# Patient Record
Sex: Female | Born: 1970 | Race: Black or African American | Hispanic: No | Marital: Single | Smoking: Never smoker
Health system: Southern US, Community
[De-identification: ages and names within clinical notes are randomized; demographics above are authoritative.]

## PROBLEM LIST (undated history)

## (undated) DIAGNOSIS — J302 Other seasonal allergic rhinitis: Secondary | ICD-10-CM

## (undated) DIAGNOSIS — Z87442 Personal history of urinary calculi: Secondary | ICD-10-CM

## (undated) DIAGNOSIS — R519 Headache, unspecified: Secondary | ICD-10-CM

## (undated) DIAGNOSIS — G4730 Sleep apnea, unspecified: Secondary | ICD-10-CM

## (undated) DIAGNOSIS — Z8679 Personal history of other diseases of the circulatory system: Secondary | ICD-10-CM

## (undated) DIAGNOSIS — D649 Anemia, unspecified: Secondary | ICD-10-CM

## (undated) DIAGNOSIS — R51 Headache: Secondary | ICD-10-CM

## (undated) DIAGNOSIS — D219 Benign neoplasm of connective and other soft tissue, unspecified: Secondary | ICD-10-CM

## (undated) DIAGNOSIS — IMO0002 Reserved for concepts with insufficient information to code with codable children: Secondary | ICD-10-CM

## (undated) DIAGNOSIS — J45909 Unspecified asthma, uncomplicated: Secondary | ICD-10-CM

## (undated) DIAGNOSIS — R87610 Atypical squamous cells of undetermined significance on cytologic smear of cervix (ASC-US): Secondary | ICD-10-CM

## (undated) DIAGNOSIS — N803 Endometriosis of pelvic peritoneum: Secondary | ICD-10-CM

## (undated) DIAGNOSIS — N201 Calculus of ureter: Secondary | ICD-10-CM

## (undated) DIAGNOSIS — K219 Gastro-esophageal reflux disease without esophagitis: Secondary | ICD-10-CM

## (undated) DIAGNOSIS — Z8669 Personal history of other diseases of the nervous system and sense organs: Secondary | ICD-10-CM

## (undated) DIAGNOSIS — Z8719 Personal history of other diseases of the digestive system: Secondary | ICD-10-CM

## (undated) DIAGNOSIS — Z8742 Personal history of other diseases of the female genital tract: Secondary | ICD-10-CM

## (undated) DIAGNOSIS — E876 Hypokalemia: Secondary | ICD-10-CM

## (undated) DIAGNOSIS — R3915 Urgency of urination: Secondary | ICD-10-CM

## (undated) DIAGNOSIS — I10 Essential (primary) hypertension: Secondary | ICD-10-CM

## (undated) DIAGNOSIS — M5030 Other cervical disc degeneration, unspecified cervical region: Secondary | ICD-10-CM

## (undated) DIAGNOSIS — T8859XA Other complications of anesthesia, initial encounter: Secondary | ICD-10-CM

## (undated) HISTORY — PX: OTHER SURGICAL HISTORY: SHX169

## (undated) HISTORY — PX: APPENDECTOMY: SHX54

## (undated) HISTORY — PX: ABDOMINAL HYSTERECTOMY: SHX81

## (undated) HISTORY — DX: Personal history of other diseases of the female genital tract: Z87.42

---
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
## 1998-04-22 ENCOUNTER — Emergency Department (HOSPITAL_COMMUNITY): Admission: EM | Admit: 1998-04-22 | Discharge: 1998-04-22 | Payer: Self-pay | Admitting: Emergency Medicine

## 1998-04-23 ENCOUNTER — Emergency Department (HOSPITAL_COMMUNITY): Admission: EM | Admit: 1998-04-23 | Discharge: 1998-04-23 | Payer: Self-pay | Admitting: Emergency Medicine

## 1998-04-28 ENCOUNTER — Encounter: Payer: Self-pay | Admitting: Emergency Medicine

## 1998-04-28 ENCOUNTER — Emergency Department (HOSPITAL_COMMUNITY): Admission: EM | Admit: 1998-04-28 | Discharge: 1998-04-28 | Payer: Self-pay | Admitting: Emergency Medicine

## 1999-02-16 ENCOUNTER — Emergency Department (HOSPITAL_COMMUNITY): Admission: EM | Admit: 1999-02-16 | Discharge: 1999-02-17 | Payer: Self-pay | Admitting: Emergency Medicine

## 1999-02-17 ENCOUNTER — Emergency Department (HOSPITAL_COMMUNITY): Admission: EM | Admit: 1999-02-17 | Discharge: 1999-02-17 | Payer: Self-pay | Admitting: Emergency Medicine

## 1999-02-20 ENCOUNTER — Other Ambulatory Visit: Admission: RE | Admit: 1999-02-20 | Discharge: 1999-02-20 | Payer: Self-pay | Admitting: Family Medicine

## 1999-03-13 ENCOUNTER — Encounter: Admission: RE | Admit: 1999-03-13 | Discharge: 1999-03-13 | Payer: Self-pay | Admitting: Obstetrics & Gynecology

## 1999-03-13 ENCOUNTER — Other Ambulatory Visit: Admission: RE | Admit: 1999-03-13 | Discharge: 1999-03-13 | Payer: Self-pay | Admitting: Obstetrics & Gynecology

## 1999-03-27 ENCOUNTER — Encounter: Admission: RE | Admit: 1999-03-27 | Discharge: 1999-03-27 | Payer: Self-pay | Admitting: Obstetrics & Gynecology

## 1999-06-14 ENCOUNTER — Ambulatory Visit (HOSPITAL_COMMUNITY): Admission: RE | Admit: 1999-06-14 | Discharge: 1999-06-14 | Payer: Self-pay | Admitting: *Deleted

## 1999-06-14 ENCOUNTER — Encounter: Payer: Self-pay | Admitting: *Deleted

## 1999-07-31 ENCOUNTER — Encounter: Admission: RE | Admit: 1999-07-31 | Discharge: 1999-07-31 | Payer: Self-pay | Admitting: Obstetrics & Gynecology

## 2001-05-13 HISTORY — PX: OTHER SURGICAL HISTORY: SHX169

## 2001-08-30 ENCOUNTER — Inpatient Hospital Stay (HOSPITAL_COMMUNITY): Admission: AD | Admit: 2001-08-30 | Discharge: 2001-08-30 | Payer: Self-pay | Admitting: *Deleted

## 2002-04-09 ENCOUNTER — Emergency Department (HOSPITAL_COMMUNITY): Admission: EM | Admit: 2002-04-09 | Discharge: 2002-04-09 | Payer: Self-pay | Admitting: Emergency Medicine

## 2003-01-05 ENCOUNTER — Emergency Department (HOSPITAL_COMMUNITY): Admission: EM | Admit: 2003-01-05 | Discharge: 2003-01-06 | Payer: Self-pay | Admitting: Emergency Medicine

## 2003-01-07 ENCOUNTER — Inpatient Hospital Stay (HOSPITAL_COMMUNITY): Admission: AD | Admit: 2003-01-07 | Discharge: 2003-01-07 | Payer: Self-pay | Admitting: Family Medicine

## 2003-01-19 ENCOUNTER — Ambulatory Visit (HOSPITAL_COMMUNITY): Admission: RE | Admit: 2003-01-19 | Discharge: 2003-01-19 | Payer: Self-pay | Admitting: Obstetrics

## 2003-01-19 ENCOUNTER — Encounter: Payer: Self-pay | Admitting: Obstetrics

## 2003-05-14 HISTORY — PX: OTHER SURGICAL HISTORY: SHX169

## 2003-08-04 ENCOUNTER — Emergency Department (HOSPITAL_COMMUNITY): Admission: EM | Admit: 2003-08-04 | Discharge: 2003-08-05 | Payer: Self-pay | Admitting: Emergency Medicine

## 2003-08-06 ENCOUNTER — Emergency Department (HOSPITAL_COMMUNITY): Admission: EM | Admit: 2003-08-06 | Discharge: 2003-08-06 | Payer: Self-pay | Admitting: Emergency Medicine

## 2003-10-21 ENCOUNTER — Emergency Department (HOSPITAL_COMMUNITY): Admission: EM | Admit: 2003-10-21 | Discharge: 2003-10-21 | Payer: Self-pay | Admitting: Family Medicine

## 2004-02-06 ENCOUNTER — Encounter: Admission: RE | Admit: 2004-02-06 | Discharge: 2004-02-06 | Payer: Self-pay | Admitting: Family Medicine

## 2004-03-28 ENCOUNTER — Emergency Department (HOSPITAL_COMMUNITY): Admission: EM | Admit: 2004-03-28 | Discharge: 2004-03-28 | Payer: Self-pay | Admitting: Emergency Medicine

## 2004-07-15 ENCOUNTER — Inpatient Hospital Stay (HOSPITAL_COMMUNITY): Admission: AD | Admit: 2004-07-15 | Discharge: 2004-07-15 | Payer: Self-pay | Admitting: *Deleted

## 2006-05-28 ENCOUNTER — Emergency Department (HOSPITAL_COMMUNITY): Admission: EM | Admit: 2006-05-28 | Discharge: 2006-05-28 | Payer: Self-pay | Admitting: Emergency Medicine

## 2006-10-08 ENCOUNTER — Emergency Department (HOSPITAL_COMMUNITY): Admission: EM | Admit: 2006-10-08 | Discharge: 2006-10-08 | Payer: Self-pay | Admitting: Emergency Medicine

## 2006-12-10 ENCOUNTER — Other Ambulatory Visit: Admission: RE | Admit: 2006-12-10 | Discharge: 2006-12-10 | Payer: Self-pay | Admitting: Family Medicine

## 2006-12-23 ENCOUNTER — Ambulatory Visit (HOSPITAL_COMMUNITY): Admission: RE | Admit: 2006-12-23 | Discharge: 2006-12-23 | Payer: Self-pay | Admitting: General Surgery

## 2006-12-29 ENCOUNTER — Encounter: Admission: RE | Admit: 2006-12-29 | Discharge: 2007-01-27 | Payer: Self-pay | Admitting: General Surgery

## 2007-07-15 ENCOUNTER — Emergency Department (HOSPITAL_COMMUNITY): Admission: EM | Admit: 2007-07-15 | Discharge: 2007-07-15 | Payer: Self-pay | Admitting: Emergency Medicine

## 2007-07-31 ENCOUNTER — Ambulatory Visit: Payer: Self-pay | Admitting: *Deleted

## 2007-07-31 ENCOUNTER — Ambulatory Visit (HOSPITAL_COMMUNITY): Admission: RE | Admit: 2007-07-31 | Discharge: 2007-07-31 | Payer: Self-pay | Admitting: Emergency Medicine

## 2007-07-31 ENCOUNTER — Encounter: Payer: Self-pay | Admitting: Emergency Medicine

## 2008-05-17 ENCOUNTER — Emergency Department (HOSPITAL_COMMUNITY): Admission: EM | Admit: 2008-05-17 | Discharge: 2008-05-18 | Payer: Self-pay | Admitting: Emergency Medicine

## 2008-05-20 ENCOUNTER — Encounter: Admission: RE | Admit: 2008-05-20 | Discharge: 2008-08-18 | Payer: Self-pay | Admitting: General Surgery

## 2008-06-06 ENCOUNTER — Ambulatory Visit (HOSPITAL_COMMUNITY): Admission: RE | Admit: 2008-06-06 | Discharge: 2008-06-07 | Payer: Self-pay | Admitting: General Surgery

## 2008-06-06 HISTORY — PX: LAPAROSCOPIC GASTRIC BANDING: SHX1100

## 2008-09-08 ENCOUNTER — Encounter: Admission: RE | Admit: 2008-09-08 | Discharge: 2008-09-08 | Payer: Self-pay | Admitting: General Surgery

## 2008-12-08 ENCOUNTER — Encounter: Admission: RE | Admit: 2008-12-08 | Discharge: 2009-03-08 | Payer: Self-pay | Admitting: General Surgery

## 2010-01-31 ENCOUNTER — Emergency Department (HOSPITAL_COMMUNITY): Admission: EM | Admit: 2010-01-31 | Discharge: 2010-01-31 | Payer: Self-pay | Admitting: Family Medicine

## 2010-08-27 LAB — DIFFERENTIAL
Basophils Absolute: 0 10*3/uL (ref 0.0–0.1)
Basophils Absolute: 0 10*3/uL (ref 0.0–0.1)
Basophils Relative: 0 % (ref 0–1)
Eosinophils Absolute: 0.1 10*3/uL (ref 0.0–0.7)
Eosinophils Absolute: 0.1 10*3/uL (ref 0.0–0.7)
Eosinophils Relative: 0 % (ref 0–5)
Eosinophils Relative: 1 % (ref 0–5)
Lymphocytes Relative: 26 % (ref 12–46)
Monocytes Absolute: 0.4 10*3/uL (ref 0.1–1.0)
Monocytes Absolute: 0.4 10*3/uL (ref 0.1–1.0)
Monocytes Relative: 3 % (ref 3–12)
Neutro Abs: 5.7 10*3/uL (ref 1.7–7.7)
Neutro Abs: 7.9 10*3/uL — ABNORMAL HIGH (ref 1.7–7.7)
Neutro Abs: 9 10*3/uL — ABNORMAL HIGH (ref 1.7–7.7)
Neutrophils Relative %: 67 % (ref 43–77)
Neutrophils Relative %: 70 % (ref 43–77)

## 2010-08-27 LAB — BASIC METABOLIC PANEL
BUN: 15 mg/dL (ref 6–23)
CO2: 27 mEq/L (ref 19–32)
Chloride: 104 mEq/L (ref 96–112)
GFR calc Af Amer: >60 mL/min (ref >60)
GFR calc non Af Amer: >60 mL/min (ref >60)
Potassium: 3.6 mEq/L (ref 3.5–5.1)
Sodium: 138 mEq/L (ref 135–145)

## 2010-08-27 LAB — CBC
HCT: 33.5 % — ABNORMAL LOW (ref 36.0–46.0)
Hemoglobin: 11.2 g/dL — ABNORMAL LOW (ref 12.0–15.0)
Hemoglobin: 11.7 g/dL — ABNORMAL LOW (ref 12.0–15.0)
MCHC: 33.3 g/dL (ref 30.0–36.0)
MCV: 79.3 fL (ref 78.0–100.0)
Platelets: 368 10*3/uL (ref 150–400)
Platelets: 425 10*3/uL — ABNORMAL HIGH (ref 150–400)
RBC: 4.23 MIL/uL (ref 3.87–5.11)
RBC: 4.51 MIL/uL (ref 3.87–5.11)
RDW: 16.5 % — ABNORMAL HIGH (ref 11.5–15.5)
WBC: 11.3 10*3/uL — ABNORMAL HIGH (ref 4.0–10.5)
WBC: 11.5 10*3/uL — ABNORMAL HIGH (ref 4.0–10.5)

## 2010-08-27 LAB — COMPREHENSIVE METABOLIC PANEL
Albumin: 3.8 g/dL (ref 3.5–5.2)
Calcium: 9.3 mg/dL (ref 8.4–10.5)
Chloride: 103 mEq/L (ref 96–112)
GFR calc non Af Amer: >60 mL/min (ref >60)
Glucose, Bld: 85 mg/dL (ref 70–99)
Sodium: 138 mEq/L (ref 135–145)
Total Protein: 6.8 g/dL (ref 6.0–8.3)

## 2010-08-27 LAB — HEMOGLOBIN AND HEMATOCRIT, BLOOD: Hemoglobin: 11.8 g/dL — ABNORMAL LOW (ref 12.0–15.0)

## 2010-08-27 LAB — PREGNANCY, URINE: Preg Test, Ur: NEGATIVE

## 2010-09-25 NOTE — Op Note (Signed)
NAMELESLYE, PUCCINI                 ACCOUNT NO.:  [id number]   MEDICAL RECORD NO.:  [id number]          PATIENT TYPE:  OIB   LOCATION:  1538                         FACILITY:  [facility]   PHYSICIAN:  Sharlet Salina T. Hoxworth, M.D.DATE OF BIRTH:  1971-04-27   DATE OF PROCEDURE:  06/06/2008  DATE OF DISCHARGE:                               OPERATIVE REPORT   PREOPERATIVE DIAGNOSIS:  Morbid obesity.   POSTOPERATIVE DIAGNOSIS:  Morbid obesity.   SURGICAL PROCEDURES:  Placement of laparoscopic adjustable gastric band.   SURGEON:  Dr. Johna Sheriff   ASSISTANT:  Sandria Bales. Ezzard Standing, M.D.   ANESTHESIA:  General.   BRIEF HISTORY:  Ms. Potenza is a 40 year old female with progressive  morbid obesity unresponsive to medical management who presents with a  BMI of 44.1 at 252 pounds with comorbidities of hypertension and  obstructive sleep apnea.  After extensive discussion detailed elsewhere,  we elected to proceed with placement of laparoscopic adjustable gastric  band for treatment of her morbid obesity.  She is now brought to the  operating room for this procedure.   DESCRIPTION OF OPERATION:  The patient was brought to the operating room  and placed in the supine position on the operating table and general  endotracheal anesthesia was induced.  She was a moderately difficult  intubation.  The abdomen was widely sterilely prepped and draped.  She  had received preoperative IV antibiotics and subcutaneous heparin.  PAS  were in place.  Correct patient and procedure were verified.  Access was  obtained with an 11-mm Optiview trocar in the left subcostal space and  pneumoperitoneum established without difficulty.  There was no evidence  of trocar injury.  Under direct vision a 15-mm trocar was placed  laterally in the right upper quadrant, another 11-mm trocar in the right  subcostal space midclavicular line, and another 11-mm trocar for the  camera just above and to the left of the umbilicus.  Under  direct vision  through a 5-mm subxiphoid site the [facility] retractor was placed and  the left lobe of the liver elevated with excellent exposure of the  stomach and the hiatus.  The patient did not have any evidence of hiatal  hernia on preoperative upper GI series or direct inspection of the  hiatus.  She did have reflux.  A sizing tube was passed orally down to  the stomach and the balloon inflated to 15 mL and pulled back snugly  against the hiatus and there was no evidence of herniation.  The balloon  was deflated and the tube pulled back into the upper esophagus.  The  peritoneum at the angle of His was incised and careful blunt dissection  carried down onto the left crus.  Following this the pars flaccida was  divided in an avascular area and the right crus identified and crossing  fat at the base of the crus identified.  The peritoneum was incised here  and careful blunt dissection was carried back in the retrogastric space  with the finger dissector which was then deployed up to the previously  dissected area of the  angle of His without difficulty.  A flushed AP  standard band system was introduced and the tubing placed into the  passer and brought back behind the stomach without difficulty, and the  band brought back behind the stomach as well.  With the sizing tube back  in place in the stomach, the band was snapped, and there was no undue  tension.  The sizing tube was removed.  Holding the tubing toward the  patient's feet to expose the small gastric pouch, the fundus was  imbricated up over the band to the pouch with several interrupted 2-0  Ethibond sutures.  The band appeared to be in excellent position.  There  was no bleeding or evidence of trocar injury.  The tubing was brought  out through the right mid abdominal trocar site and under direct vision  the [facility] retractor was removed and all CO2 evacuated and trocars  removed.  This right mid abdominal incision was  extended slightly  laterally and a subcutaneous pocket created to the port.  A 2-cm square  of polypropylene mesh was sutured to the underside of the port and then  the tubing was cut and the port detached and the port placed in a  subcutaneous position nicely oriented.  The tubing was introduced  smoothly into the abdominal cavity.  Subcu was closed with running 2-0  Vicryl.  Skin was closed with subcutaneous Monocryl and  Dermabond.  Sponge, needle, and instrument counts were correct.  She was taken to  Recovery.      Lorne Skeens. Hoxworth, M.D.  Electronically Signed     BTH/MEDQ  D:  06/06/2008  T:  06/06/2008  Job:  [id number]

## 2010-11-23 ENCOUNTER — Encounter (INDEPENDENT_AMBULATORY_CARE_PROVIDER_SITE_OTHER): Payer: Self-pay | Admitting: General Surgery

## 2010-12-07 ENCOUNTER — Encounter (INDEPENDENT_AMBULATORY_CARE_PROVIDER_SITE_OTHER): Payer: Self-pay

## 2010-12-26 ENCOUNTER — Encounter (INDEPENDENT_AMBULATORY_CARE_PROVIDER_SITE_OTHER): Payer: Self-pay | Admitting: General Surgery

## 2011-06-06 ENCOUNTER — Emergency Department (INDEPENDENT_AMBULATORY_CARE_PROVIDER_SITE_OTHER)
Admission: EM | Admit: 2011-06-06 | Discharge: 2011-06-06 | Disposition: A | Discharge status: Home / Self Care | Payer: Self-pay | Source: Home / Self Care | Attending: Family Medicine | Admitting: Family Medicine

## 2011-06-06 DIAGNOSIS — J45901 Unspecified asthma with (acute) exacerbation: Secondary | ICD-10-CM

## 2011-06-06 DIAGNOSIS — J069 Acute upper respiratory infection, unspecified: Secondary | ICD-10-CM

## 2011-06-06 MED ORDER — CETIRIZINE HCL 10 MG PO CAPS: 1.0000 | ORAL_CAPSULE | Freq: Every evening | ORAL | Status: DC | PRN

## 2011-06-06 MED ORDER — ALBUTEROL SULFATE HFA 108 (90 BASE) MCG/ACT IN AERS: 1.0000 | INHALATION_SPRAY | Freq: Four times a day (QID) | RESPIRATORY_TRACT | Status: DC | PRN

## 2011-06-06 MED ORDER — ACETAMINOPHEN-CODEINE 120-12 MG/5ML PO SUSP: 5.0000 mL | Freq: Four times a day (QID) | ORAL | Status: AC | PRN

## 2011-06-06 MED ORDER — PREDNISONE 20 MG PO TABS: ORAL_TABLET | ORAL | Status: AC

## 2011-06-06 NOTE — ED Provider Notes (Signed)
History     CSN: [id number]  Arrival date & time 06/06/11  1553   First MD Initiated Contact with Patient 06/06/11 1601      Chief Complaint  Patient presents with  . URI    (Consider location/radiation/quality/duration/timing/severity/associated sxs/prior treatment) HPI Comments: 41 y/o female h/o asthma here c/o sore throat, hoarseness, chest congestion and wheezing episodes at night time for 3 days. Ran out albuterol. No fever, no chest pain or shortness or breath. No rash, no nausea vomiting or diarrhea.   No past medical history on file.  No past surgical history on file.  No family history on file.  History  Substance Use Topics  . Smoking status: Not on file  . Smokeless tobacco: Not on file  . Alcohol Use: Not on file    OB History    No data available      Review of Systems  Constitutional: Negative for fever, chills, diaphoresis and appetite change.  HENT: Positive for congestion, sore throat and voice change. Negative for rhinorrhea, trouble swallowing, neck pain and sinus pressure.   Respiratory: Positive for cough, chest tightness and wheezing. Negative for shortness of breath.   Cardiovascular: Negative for chest pain, palpitations and leg swelling.  Gastrointestinal: Negative for nausea, vomiting, abdominal pain and diarrhea.  Musculoskeletal: Negative for myalgias and arthralgias.  Skin: Negative for rash.  Neurological: Negative for dizziness and headaches.    Allergies  Vicodin  Home Medications   Current Outpatient Rx  Name Route Sig Dispense Refill  . ACETAMINOPHEN-CODEINE 120-12 MG/5ML PO SUSP Oral Take 5 mLs by mouth every 6 (six) hours as needed for pain. 60 mL 0  . ALBUTEROL SULFATE HFA 108 (90 BASE) MCG/ACT IN AERS Inhalation Inhale 1-2 puffs into the lungs every 6 (six) hours as needed for wheezing. 1 Inhaler 0  . CETIRIZINE HCL 10 MG PO CAPS Oral Take 1 capsule (10 mg total) by mouth at bedtime as needed. 30 capsule 0  . PREDNISONE  20 MG PO TABS  2 tabs po daily for 5 days 10 tablet no    BP 102/58  Pulse 76  Temp(Src) 98.3 F (36.8 C) (Oral)  Resp 18  SpO2 100%  Physical Exam  Nursing note and vitals reviewed. Constitutional: She is oriented to person, place, and time. She appears well-developed and well-nourished. No distress.  HENT:  Head: Normocephalic and atraumatic.  Right Ear: External ear normal.  Left Ear: External ear normal.  Mouth/Throat: No oropharyngeal exudate.       Nasal Congestion with erythema and swelling of nasal turbinates. No rhinorrhea. Mild pharyngeal erythema no exudates. No uvula deviation. No trismus. TM's normal.  Eyes: Conjunctivae and EOM are normal. Pupils are equal, round, and reactive to light.  Neck: Neck supple.  Cardiovascular: Normal rate, regular rhythm and normal heart sounds.   Pulmonary/Chest: Effort normal and breath sounds normal. No respiratory distress. She has no wheezes. She has no rales. She exhibits no tenderness.  Lymphadenopathy:    She has no cervical adenopathy.  Neurological: She is alert and oriented to person, place, and time.  Skin: No rash noted.    ED Course  Procedures (including critical care time)  Labs Reviewed - No data to display No results found.   1. URI (upper respiratory infection)   2. Asthma exacerbation       MDM  Refilled albuterol, prescribed prednisone, codeine and cetirizine.         Sharin Grave, MD 06/06/11 1718

## 2011-06-06 NOTE — ED Notes (Signed)
Pt here with cold sx and chest congestion that started x3 dys.pt has hx asthma but ran out of inhaler.tried otc cough meds but no relief

## 2011-06-07 ENCOUNTER — Telehealth (HOSPITAL_COMMUNITY): Payer: Self-pay | Admitting: *Deleted

## 2012-01-31 ENCOUNTER — Telehealth (INDEPENDENT_AMBULATORY_CARE_PROVIDER_SITE_OTHER): Payer: Self-pay | Admitting: General Surgery

## 2012-01-31 NOTE — Telephone Encounter (Signed)
I left a voicemail stating the patient should contact [facility] @ [phone] to schedule a follow-up appt...cef °

## 2013-03-30 ENCOUNTER — Encounter (HOSPITAL_COMMUNITY): Payer: Self-pay | Admitting: Emergency Medicine

## 2013-03-30 ENCOUNTER — Other Ambulatory Visit (HOSPITAL_COMMUNITY)
Admission: RE | Admit: 2013-03-30 | Discharge: 2013-03-30 | Disposition: A | Discharge status: Home / Self Care | Payer: Self-pay | Source: Ambulatory Visit | Attending: Emergency Medicine | Admitting: Emergency Medicine

## 2013-03-30 ENCOUNTER — Emergency Department (INDEPENDENT_AMBULATORY_CARE_PROVIDER_SITE_OTHER)
Admission: EM | Admit: 2013-03-30 | Discharge: 2013-03-30 | Disposition: A | Discharge status: Home / Self Care | Payer: Self-pay | Source: Home / Self Care | Attending: Emergency Medicine | Admitting: Emergency Medicine

## 2013-03-30 DIAGNOSIS — N760 Acute vaginitis: Secondary | ICD-10-CM | POA: Insufficient documentation

## 2013-03-30 DIAGNOSIS — Z113 Encounter for screening for infections with a predominantly sexual mode of transmission: Secondary | ICD-10-CM | POA: Insufficient documentation

## 2013-03-30 HISTORY — DX: Essential (primary) hypertension: I10

## 2013-03-30 LAB — HIV ANTIBODY (ROUTINE TESTING W REFLEX): HIV: NONREACTIVE

## 2013-03-30 LAB — RPR: RPR Ser Ql: NONREACTIVE

## 2013-03-30 LAB — POCT URINALYSIS DIP (DEVICE)

## 2013-03-30 MED ORDER — FLUCONAZOLE 150 MG PO TABS: 150.0000 mg | ORAL_TABLET | Freq: Once | ORAL | Status: DC

## 2013-03-30 MED ORDER — METRONIDAZOLE 0.75 % VA GEL: 1.0000 | Freq: Two times a day (BID) | VAGINAL | Status: DC

## 2013-03-30 NOTE — ED Provider Notes (Signed)
Chief Complaint:   Chief Complaint  Patient presents with  . Vaginal Discharge    History of Present Illness:   Meagan Dean is a 42 year old female who has a history of frequent urinary tract infections, until vaginosis, and candidiasis. This has occurred after a lap band surgery in which she lost about 140 pounds. Over the past week she's had a white, clumpy vaginal discharge, odor, and slight itching. Her menses have been irregular over the past several years. Last menstrual period began last week. She is sexually active without any protection. She is concerned about the possibility of STDs. She denies fever, chills, nausea, vomiting, or urinary symptoms.  Review of Systems:  Other than noted above, the patient denies any of the following symptoms: Systemic:  No fever, chills, sweats, or weight loss. GI:  No abdominal pain, nausea, anorexia, vomiting, diarrhea, constipation, melena or hematochezia. GU:  No dysuria, frequency, urgency, hematuria, vaginal discharge, itching, or abnormal vaginal bleeding. Skin:  No rash or itching.  PMFSH:  Past medical history, family history, social history, meds, and allergies were reviewed. She is allergic to Vicodin. She has a history of fibroids, migraine headaches, hypertension, and has had lap band surgery.  Physical Exam:   Vital signs:  BP 111/90  Pulse 67  Temp(Src) 98.8 F (37.1 C) (Oral)  Resp 18  SpO2 100%  LMP 03/22/2013 General:  Alert, oriented and in no distress. Lungs:  Breath sounds clear and equal bilaterally.  No wheezes, rales or rhonchi. Heart:  Regular rhythm.  No gallops or murmers. Abdomen:  Soft, flat and non-distended.  No organomegaly or mass.  No tenderness, guarding or rebound.  Bowel sounds normally active. Pelvic exam:  Normal external genitalia. There was a scant, homogeneous, white, malodorous discharge. Cervix and vaginal mucosa were normal. Uterus was midposition, normal in size and shape, and nontender. No adnexal  masses or tenderness. DNA probes for gonorrhea, Chlamydia, Trichomonas, Candida, Gardnerella were obtained. Skin:  Clear, warm and dry.  Labs:   Results for orders placed during the hospital encounter of 03/30/13  POCT URINALYSIS DIP (DEVICE)      Result Value Range   Glucose, UA NEGATIVE  NEGATIVE mg/dL   Bilirubin Urine SMALL (*) NEGATIVE   Ketones, ur TRACE (*) NEGATIVE mg/dL   Specific Gravity, Urine >=1.030  1.005 - 1.030   Hgb urine dipstick LARGE (*) NEGATIVE   pH 6.5  5.0 - 8.0   Protein, ur 100 (*) NEGATIVE mg/dL   Urobilinogen, UA 2.0 (*) 0.0 - 1.0 mg/dL   Nitrite NEGATIVE  NEGATIVE   Leukocytes, UA TRACE (*) NEGATIVE  POCT PREGNANCY, URINE      Result Value Range   Preg Test, Ur NEGATIVE  NEGATIVE    Assessment:  The encounter diagnosis was Vaginitis.  Differential diagnosis is bacterial vaginosis versus yeast, will treat for both.  Plan:   1.  Meds:  The following meds were prescribed:   New Prescriptions   FLUCONAZOLE (DIFLUCAN) 150 MG TABLET    Take 1 tablet (150 mg total) by mouth once.   METRONIDAZOLE (METROGEL) 0.75 % VAGINAL GEL    Place 1 Applicatorful vaginally 2 (two) times daily.    2.  Patient Education/Counseling:  The patient was given appropriate handouts, self care instructions, and instructed in symptomatic relief.   3.  Follow up:  The patient was told to follow up if no better in 3 to 4 days, if becoming worse in any way, and given some red flag  symptoms such as fever or pelvic pain which would prompt immediate return.  Follow up here as necessary.     Reuben Likes, MD 03/30/13 [phone]

## 2013-03-30 NOTE — ED Notes (Signed)
Pt reports a whitish vaginal discharge the past week with some urinary burning

## 2013-04-01 NOTE — ED Notes (Addendum)
GC/Chlamydia neg., Affirm: Candida and Trich neg., Gardnerella pos., HIV/RPR non-reactive. Pt. adequately treated with Metrogel. Vassie Moselle 04/01/2013

## 2013-06-16 ENCOUNTER — Emergency Department (HOSPITAL_COMMUNITY): Payer: Self-pay

## 2013-06-16 ENCOUNTER — Encounter (HOSPITAL_COMMUNITY): Payer: Self-pay | Admitting: Emergency Medicine

## 2013-06-16 ENCOUNTER — Observation Stay (HOSPITAL_COMMUNITY)
Admission: EM | Admit: 2013-06-16 | Discharge: 2013-06-19 | Disposition: A | Discharge status: Home / Self Care | Payer: Self-pay | Attending: Urology | Admitting: Urology

## 2013-06-16 DIAGNOSIS — N390 Urinary tract infection, site not specified: Secondary | ICD-10-CM | POA: Insufficient documentation

## 2013-06-16 DIAGNOSIS — N200 Calculus of kidney: Secondary | ICD-10-CM | POA: Insufficient documentation

## 2013-06-16 DIAGNOSIS — E876 Hypokalemia: Secondary | ICD-10-CM | POA: Diagnosis present

## 2013-06-16 DIAGNOSIS — N23 Unspecified renal colic: Secondary | ICD-10-CM

## 2013-06-16 DIAGNOSIS — Z87891 Personal history of nicotine dependence: Secondary | ICD-10-CM | POA: Insufficient documentation

## 2013-06-16 DIAGNOSIS — Z9884 Bariatric surgery status: Secondary | ICD-10-CM | POA: Insufficient documentation

## 2013-06-16 DIAGNOSIS — R1110 Vomiting, unspecified: Secondary | ICD-10-CM

## 2013-06-16 DIAGNOSIS — A498 Other bacterial infections of unspecified site: Secondary | ICD-10-CM | POA: Insufficient documentation

## 2013-06-16 DIAGNOSIS — R112 Nausea with vomiting, unspecified: Secondary | ICD-10-CM | POA: Insufficient documentation

## 2013-06-16 DIAGNOSIS — N201 Calculus of ureter: Principal | ICD-10-CM | POA: Diagnosis present

## 2013-06-16 DIAGNOSIS — I10 Essential (primary) hypertension: Secondary | ICD-10-CM | POA: Insufficient documentation

## 2013-06-16 HISTORY — DX: Calculus of ureter: N20.1

## 2013-06-16 LAB — POCT I-STAT, CHEM 8
BUN: 7 mg/dL (ref 6–23)
CHLORIDE: 95 meq/L — AB (ref 96–112)
CREATININE: 1.3 mg/dL — AB (ref 0.50–1.10)
Calcium, Ion: 1.08 mmol/L — ABNORMAL LOW (ref 1.12–1.23)
GLUCOSE: 79 mg/dL (ref 70–99)
HCT: 34 % — ABNORMAL LOW (ref 36.0–46.0)
Hemoglobin: 11.6 g/dL — ABNORMAL LOW (ref 12.0–15.0)
POTASSIUM: 2.2 meq/L — AB (ref 3.7–5.3)
Sodium: 140 mEq/L (ref 137–147)
TCO2: 30 mmol/L (ref 0–100)

## 2013-06-16 LAB — URINALYSIS, ROUTINE W REFLEX MICROSCOPIC
BILIRUBIN URINE: NEGATIVE
Glucose, UA: NEGATIVE mg/dL
Ketones, ur: NEGATIVE mg/dL
Nitrite: NEGATIVE
Protein, ur: 30 mg/dL — AB
Specific Gravity, Urine: 1.017 (ref 1.005–1.030)
UROBILINOGEN UA: 1 mg/dL (ref 0.0–1.0)
pH: 7 (ref 5.0–8.0)

## 2013-06-16 LAB — COMPREHENSIVE METABOLIC PANEL
ALT: 7 U/L (ref 0–35)
AST: 12 U/L (ref 0–37)
Albumin: 3.7 g/dL (ref 3.5–5.2)
Alkaline Phosphatase: 67 U/L (ref 39–117)
BILIRUBIN TOTAL: 0.7 mg/dL (ref 0.3–1.2)
BUN: 8 mg/dL (ref 6–23)
CHLORIDE: 96 meq/L (ref 96–112)
CO2: 32 meq/L (ref 19–32)
CREATININE: 1.35 mg/dL — AB (ref 0.50–1.10)
Calcium: 9 mg/dL (ref 8.4–10.5)
GFR calc non Af Amer: 48 mL/min — ABNORMAL LOW (ref >90)
GFR, EST AFRICAN AMERICAN: 55 mL/min — AB (ref >90)
GLUCOSE: 95 mg/dL (ref 70–99)
Potassium: 2.3 mEq/L — CL (ref 3.7–5.3)
Sodium: 138 mEq/L (ref 137–147)
Total Protein: 7.9 g/dL (ref 6.0–8.3)

## 2013-06-16 LAB — CBC WITH DIFFERENTIAL/PLATELET
BASOS ABS: 0 10*3/uL (ref 0.0–0.1)
Basophils Relative: 0 % (ref 0–1)
Eosinophils Absolute: 0 10*3/uL (ref 0.0–0.7)
Eosinophils Relative: 0 % (ref 0–5)
HEMATOCRIT: 34.6 % — AB (ref 36.0–46.0)
HEMOGLOBIN: 11.4 g/dL — AB (ref 12.0–15.0)
LYMPHS PCT: 22 % (ref 12–46)
Lymphs Abs: 2.1 10*3/uL (ref 0.7–4.0)
MCH: 27 pg (ref 26.0–34.0)
MCHC: 32.9 g/dL (ref 30.0–36.0)
MCV: 82 fL (ref 78.0–100.0)
MONO ABS: 0.4 10*3/uL (ref 0.1–1.0)
MONOS PCT: 5 % (ref 3–12)
Neutro Abs: 6.9 10*3/uL (ref 1.7–7.7)
Neutrophils Relative %: 73 % (ref 43–77)
Platelets: 392 10*3/uL (ref 150–400)
RBC: 4.22 MIL/uL (ref 3.87–5.11)
RDW: 13.6 % (ref 11.5–15.5)
WBC: 9.5 10*3/uL (ref 4.0–10.5)

## 2013-06-16 LAB — POCT I-STAT TROPONIN I: Troponin i, poc: 0 ng/mL (ref 0.00–0.08)

## 2013-06-16 LAB — URINE MICROSCOPIC-ADD ON

## 2013-06-16 LAB — POCT PREGNANCY, URINE: Preg Test, Ur: NEGATIVE

## 2013-06-16 MED ORDER — POTASSIUM CHLORIDE CRYS ER 20 MEQ PO TBCR
40.0000 meq | EXTENDED_RELEASE_TABLET | Freq: Once | ORAL | Status: AC
Start: 2013-06-16 — End: 2013-06-16
  Administered 2013-06-16: 40 meq via ORAL
  Filled 2013-06-16: qty 2

## 2013-06-16 MED ORDER — POTASSIUM CHLORIDE IN NACL 20-0.45 MEQ/L-% IV SOLN
INTRAVENOUS | Status: DC
  Administered 2013-06-16 – 2013-06-18 (×6): via INTRAVENOUS
  Filled 2013-06-16 (×9): qty 1000

## 2013-06-16 MED ORDER — PROMETHAZINE HCL 25 MG/ML IJ SOLN
12.5000 mg | Freq: Four times a day (QID) | INTRAMUSCULAR | Status: DC | PRN
  Administered 2013-06-19: 12.5 mg via INTRAVENOUS
  Filled 2013-06-16: qty 1

## 2013-06-16 MED ORDER — ONDANSETRON HCL 4 MG/2ML IJ SOLN
4.0000 mg | Freq: Once | INTRAMUSCULAR | Status: AC
  Administered 2013-06-16: 4 mg via INTRAVENOUS
  Filled 2013-06-16: qty 2

## 2013-06-16 MED ORDER — HEPARIN SODIUM (PORCINE) 5000 UNIT/ML IJ SOLN
5000.0000 [IU] | Freq: Three times a day (TID) | INTRAMUSCULAR | Status: DC
  Administered 2013-06-16 – 2013-06-19 (×8): 5000 [IU] via SUBCUTANEOUS
  Filled 2013-06-16 (×11): qty 1

## 2013-06-16 MED ORDER — DOCUSATE SODIUM 100 MG PO CAPS
100.0000 mg | ORAL_CAPSULE | Freq: Two times a day (BID) | ORAL | Status: DC
  Administered 2013-06-16 – 2013-06-19 (×6): 100 mg via ORAL
  Filled 2013-06-16 (×8): qty 1

## 2013-06-16 MED ORDER — SODIUM CHLORIDE 0.9 % IV BOLUS (SEPSIS)
1000.0000 mL | Freq: Once | INTRAVENOUS | Status: AC
  Administered 2013-06-16: 1000 mL via INTRAVENOUS

## 2013-06-16 MED ORDER — ONDANSETRON HCL 4 MG/2ML IJ SOLN
4.0000 mg | INTRAMUSCULAR | Status: DC | PRN
  Administered 2013-06-16 – 2013-06-18 (×5): 4 mg via INTRAVENOUS
  Filled 2013-06-16 (×5): qty 2

## 2013-06-16 MED ORDER — KETOROLAC TROMETHAMINE 30 MG/ML IJ SOLN
30.0000 mg | Freq: Once | INTRAMUSCULAR | Status: AC
  Administered 2013-06-16: 30 mg via INTRAVENOUS
  Filled 2013-06-16: qty 1

## 2013-06-16 MED ORDER — HYDROMORPHONE HCL PF 1 MG/ML IJ SOLN
1.0000 mg | INTRAMUSCULAR | Status: DC | PRN
  Administered 2013-06-16 – 2013-06-19 (×8): 1 mg via INTRAVENOUS
  Filled 2013-06-16 (×10): qty 1

## 2013-06-16 MED ORDER — ZOLPIDEM TARTRATE 5 MG PO TABS: 5.0000 mg | ORAL_TABLET | Freq: Every evening | ORAL | Status: DC | PRN

## 2013-06-16 MED ORDER — TAMSULOSIN HCL 0.4 MG PO CAPS
0.4000 mg | ORAL_CAPSULE | Freq: Every day | ORAL | Status: DC
  Administered 2013-06-16 – 2013-06-17 (×2): 0.4 mg via ORAL
  Filled 2013-06-16 (×3): qty 1

## 2013-06-16 MED ORDER — POTASSIUM CHLORIDE 10 MEQ/100ML IV SOLN
10.0000 meq | INTRAVENOUS | Status: AC
  Administered 2013-06-16 (×3): 10 meq via INTRAVENOUS
  Filled 2013-06-16 (×3): qty 100

## 2013-06-16 MED ORDER — PNEUMOCOCCAL VAC POLYVALENT 25 MCG/0.5ML IJ INJ
0.5000 mL | INJECTION | INTRAMUSCULAR | Status: AC
  Administered 2013-06-17: 0.5 mL via INTRAMUSCULAR
  Filled 2013-06-16 (×2): qty 0.5

## 2013-06-16 NOTE — H&P (Signed)
Urology Admission H&P  Chief Complaint: 3 days right flank pain radiating into RLQ with gross hematuria. Nausea/vomiting X 2 days.   History of Present Illness: Healthy 43 YO AA female seen in the ER today for right flank pain radiating into RLQ. Has had (+) gross hematuria but denies previous history of nephrolithiasis. No FM of stone disease. Has taken OTC sinus medication. Denies fever or chills.   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   Past Medical History  Diagnosis Date  . Asthma   . Hypertension    Past Surgical History  Procedure Laterality Date  . Laparoscopic gastric banding      Home Medications:   (Not in a hospital admission) Allergies:  Allergies  Allergen Reactions  . Vicodin [Hydrocodone-Acetaminophen] Other (See Comments)    "very bad reaction"    No family history on file. Social History:  reports that she quit smoking about 4 years ago. She does not have any smokeless tobacco history on file. She reports that she drinks alcohol. She reports that she uses illicit drugs.  Review of Systems  Gastrointestinal: Positive for nausea, vomiting and abdominal pain.  Genitourinary: Positive for hematuria.  Musculoskeletal: Positive for back pain.    Physical Exam:  Vital signs in last 24 hours: Temp:  [97.6 F (36.4 C)-99.3 F (37.4 C)] 97.6 F (36.4 C) (02/04 1353) Pulse Rate:  [75-100] 100 (02/04 1353) Resp:  [18] 18 (02/04 1353) BP: (101-132)/(60-85) 101/60 mmHg (02/04 1353) SpO2:  [100 %] 100 % (02/04 1353) Physical Exam  Constitutional: She is oriented to person, place, and time. She appears well-developed and well-nourished. No distress.  HENT:  Head: Normocephalic.  Neck: Normal range of motion.  Cardiovascular: Normal rate and regular rhythm.   Respiratory: Effort normal and breath sounds normal.  GI: Soft. Bowel sounds are normal. She exhibits no distension. There is no tenderness. There is no rebound  and no guarding.  Musculoskeletal: Normal range of motion.  Neurological: She is alert and oriented to person, place, and time.  Skin: Skin is warm and dry.  Psychiatric: She has a normal mood and affect.    Laboratory Data:  Results for orders placed during the hospital encounter of 06/16/13 (from the past 24 hour(s))  CBC WITH DIFFERENTIAL     Status: Abnormal   Collection Time    06/16/13 11:33 AM      Result Value Range   WBC 9.5  4.0 - 10.5 K/uL   RBC 4.22  3.87 - 5.11 MIL/uL   Hemoglobin 11.4 (*) 12.0 - 15.0 g/dL   HCT 34.6 (*) 36.0 - 46.0 %   MCV 82.0  78.0 - 100.0 fL   MCH 27.0  26.0 - 34.0 pg   MCHC 32.9  30.0 - 36.0 g/dL   RDW 13.6  11.5 - 15.5 %   Platelets 392  150 - 400 K/uL   Neutrophils Relative % 73  43 - 77 %   Neutro Abs 6.9  1.7 - 7.7 K/uL   Lymphocytes Relative 22  12 - 46 %   Lymphs Abs 2.1  0.7 - 4.0 K/uL   Monocytes Relative 5  3 - 12 %   Monocytes Absolute 0.4  0.1 - 1.0 K/uL   Eosinophils Relative 0  0 - 5 %   Eosinophils Absolute 0.0  0.0 - 0.7 K/uL   Basophils Relative 0  0 - 1 %   Basophils Absolute 0.0  0.0 -  0.1 K/uL  COMPREHENSIVE METABOLIC PANEL     Status: Abnormal   Collection Time    06/16/13 11:33 AM      Result Value Range   Sodium 138  137 - 147 mEq/L   Potassium 2.3 (*) 3.7 - 5.3 mEq/L   Chloride 96  96 - 112 mEq/L   CO2 32  19 - 32 mEq/L   Glucose, Bld 95  70 - 99 mg/dL   BUN 8  6 - 23 mg/dL   Creatinine, Ser 1.35 (*) 0.50 - 1.10 mg/dL   Calcium 9.0  8.4 - 10.5 mg/dL   Total Protein 7.9  6.0 - 8.3 g/dL   Albumin 3.7  3.5 - 5.2 g/dL   AST 12  0 - 37 U/L   ALT 7  0 - 35 U/L   Alkaline Phosphatase 67  39 - 117 U/L   Total Bilirubin 0.7  0.3 - 1.2 mg/dL   GFR calc non Af Amer 48 (*) >90 mL/min   GFR calc Af Amer 55 (*) >90 mL/min  URINALYSIS, ROUTINE W REFLEX MICROSCOPIC     Status: Abnormal   Collection Time    06/16/13 12:17 PM      Result Value Range   Color, Urine YELLOW  YELLOW   APPearance CLOUDY (*) CLEAR   Specific  Gravity, Urine 1.017  1.005 - 1.030   pH 7.0  5.0 - 8.0   Glucose, UA NEGATIVE  NEGATIVE mg/dL   Hgb urine dipstick LARGE (*) NEGATIVE   Bilirubin Urine NEGATIVE  NEGATIVE   Ketones, ur NEGATIVE  NEGATIVE mg/dL   Protein, ur 30 (*) NEGATIVE mg/dL   Urobilinogen, UA 1.0  0.0 - 1.0 mg/dL   Nitrite NEGATIVE  NEGATIVE   Leukocytes, UA LARGE (*) NEGATIVE  URINE MICROSCOPIC-ADD ON     Status: Abnormal   Collection Time    06/16/13 12:17 PM      Result Value Range   Squamous Epithelial / LPF FEW (*) RARE   WBC, UA 21-50  <3 WBC/hpf   RBC / HPF 21-50  <3 RBC/hpf   Bacteria, UA FEW (*) RARE  POCT PREGNANCY, URINE     Status: None   Collection Time    06/16/13 12:23 PM      Result Value Range   Preg Test, Ur NEGATIVE  NEGATIVE  POCT I-STAT TROPONIN I     Status: None   Collection Time    06/16/13  1:12 PM      Result Value Range   Troponin i, poc 0.00  0.00 - 0.08 ng/mL   Comment 3           POCT I-STAT, CHEM 8     Status: Abnormal   Collection Time    06/16/13  1:24 PM      Result Value Range   Sodium 140  137 - 147 mEq/L   Potassium 2.2 (*) 3.7 - 5.3 mEq/L   Chloride 95 (*) 96 - 112 mEq/L   BUN 7  6 - 23 mg/dL   Creatinine, Ser 1.30 (*) 0.50 - 1.10 mg/dL   Glucose, Bld 79  70 - 99 mg/dL   Calcium, Ion 1.08 (*) 1.12 - 1.23 mmol/L   TCO2 30  0 - 100 mmol/L   Hemoglobin 11.6 (*) 12.0 - 15.0 g/dL   HCT 34.0 (*) 36.0 - 46.0 %   Comment NOTIFIED PHYSICIAN     No results found for this or any previous visit (from the  past 240 hour(s)). Creatinine:  Recent Labs  06/16/13 1133 06/16/13 1324  CREATININE 1.35* 1.30*   Baseline Creatinine:   Impression/Assessment:  Ureteral stone (R) 4 mm with mild/moderate right hydronephrosis.  Tiny non-obstructing left renal calculus  Plan:  Pt will remain NPO Dr. Diona Fanti plans to take pt to surgery later this afternoon for cystoureterscopy, right retrograde pyelogram, possible stone extraction, and possible double J stent placement.  Risks and benefits discussed with pt but he plans to again discuss with patient in person prior to procedure. All questions addressed and answered to patient's and family's satisfaction and she wishes to proceed.  Hypokalemia- this is now being addressed by IV potassium 10 mEq 100 mg IVPB  WARDEN,DIANE 06/16/2013, 2:34 PM   I have personally interviewed and examined the patient. I agree with the above, except the fact that I do not think she needs surgery at this point. She is fairly comfortable. I think she needs to be repleted with regards to her potassium, kept comfortable, and started on tamsulosin for medical expulsive therapy. If she fails this, or has persistent pain, nausea or vomiting, I would consider lithotripsy or cystoscopy/ureteroscopy. I discussed this with her. I will recheck her in the morning following basic metabolic panel. She understands this plan and currently agrees with that.

## 2013-06-16 NOTE — ED Notes (Signed)
Patient transported to CT 

## 2013-06-16 NOTE — Progress Notes (Signed)
[facility] provided pt with a list of primary care resources, [facility] information, and a [facility] [employer] Card application.  °

## 2013-06-16 NOTE — ED Notes (Signed)
Pt states for the past 3 days she's had R sided abdominal pain radiating around to back, states has worsened over the days, states has been nauseous and yesterday vomited x 1, pt states pain comes and goes. Also states has had normal menstrual cycle but recently has had blood clots and blood when using restroom.

## 2013-06-16 NOTE — ED Notes (Signed)
MD/RN notified of low potassium

## 2013-06-16 NOTE — ED Notes (Signed)
Per pt, states righ lower abdominal pain radiating to back-states has some blood and clots in urine on Friday

## 2013-06-16 NOTE — ED Provider Notes (Signed)
CSN: [id number]     Arrival date & time 06/16/13  1029 History   First MD Initiated Contact with Patient 06/16/13 1133     Chief Complaint  Patient presents with  . Abdominal Pain  . Flank Pain  . Hematuria   (Consider location/radiation/quality/duration/timing/severity/associated sxs/prior Treatment) The history is provided by the patient.  Meagan Dean is a 43 y.o. female hx of asthma, HTN, lap band surgery here with R flank pain, vomiting, hematuria. Patient has been having right flank pain for the last 4 days. Pain is sharp and radiates to her groin. She also has some hematuria as well. No fevers or chills. She's been unable to keep down any solids for the last several days. At baseline she has vomiting twice or three times a week due to her lap band surgery. No history of kidney stones.    Past Medical History  Diagnosis Date  . Asthma   . Hypertension    Past Surgical History  Procedure Laterality Date  . Laparoscopic gastric banding     No family history on file. History  Substance Use Topics  . Smoking status: Former Smoker    Quit date: 03/30/2009  . Smokeless tobacco: Not on file  . Alcohol Use: Yes     Comment: occasionally   OB History   Grav Para Term Preterm Abortions TAB SAB Ect Mult Living                 Review of Systems  Gastrointestinal: Positive for abdominal pain.  Genitourinary: Positive for hematuria and flank pain.  All other systems reviewed and are negative.    Allergies  Vicodin  Home Medications   Current Outpatient Rx  Name  Route  Sig  Dispense  Refill  . acetaminophen (TYLENOL) 325 MG tablet   Oral   Take 650 mg by mouth every 6 (six) hours as needed.         . Cetirizine HCl 10 MG CAPS   Oral   Take 1 capsule (10 mg total) by mouth at bedtime as needed.   30 capsule   0   . pseudoephedrine (SUDAFED) 30 MG tablet   Oral   Take 30 mg by mouth every 4 (four) hours as needed for congestion.          BP 101/60  Pulse  100  Temp(Src) 97.6 F (36.4 C) (Oral)  Resp 18  SpO2 100%  LMP 05/31/2013 Physical Exam  Nursing note and vitals reviewed. Constitutional: She is oriented to person, place, and time.  Uncomfortable   HENT:  Head: Normocephalic.  Mouth/Throat: Oropharynx is clear and moist.  Eyes: Conjunctivae are normal. Pupils are equal, round, and reactive to light.  Neck: Normal range of motion. Neck supple.  Cardiovascular: Normal rate, regular rhythm and normal heart sounds.   Pulmonary/Chest: Effort normal and breath sounds normal. No respiratory distress. She has no wheezes. She has no rales.  Abdominal: Soft. Bowel sounds are normal.  + R CVAT. Mild RUQ and RLQ tenderness as well, no rebound   Musculoskeletal: Normal range of motion. She exhibits no edema.  Neurological: She is alert and oriented to person, place, and time.  Skin: Skin is warm and dry.  Psychiatric: She has a normal mood and affect. Her behavior is normal. Judgment and thought content normal.    ED Course  Procedures (including critical care time) Labs Review Labs Reviewed  CBC WITH DIFFERENTIAL - Abnormal; Notable for the following:  Hemoglobin 11.4 (*)    HCT 34.6 (*)    All other components within normal limits  COMPREHENSIVE METABOLIC PANEL - Abnormal; Notable for the following:    Potassium 2.3 (*)    Creatinine, Ser 1.35 (*)    GFR calc non Af Amer 48 (*)    GFR calc Af Amer 55 (*)    All other components within normal limits  URINALYSIS, ROUTINE W REFLEX MICROSCOPIC - Abnormal; Notable for the following:    APPearance CLOUDY (*)    Hgb urine dipstick LARGE (*)    Protein, ur 30 (*)    Leukocytes, UA LARGE (*)    All other components within normal limits  URINE MICROSCOPIC-ADD ON - Abnormal; Notable for the following:    Squamous Epithelial / LPF FEW (*)    Bacteria, UA FEW (*)    All other components within normal limits  POCT I-STAT, CHEM 8 - Abnormal; Notable for the following:    Potassium 2.2  (*)    Chloride 95 (*)    Creatinine, Ser 1.30 (*)    Calcium, Ion 1.08 (*)    Hemoglobin 11.6 (*)    HCT 34.0 (*)    All other components within normal limits  URINE CULTURE  POCT PREGNANCY, URINE  POCT I-STAT TROPONIN I   Imaging Review Ct Abdomen Pelvis Wo Contrast  06/16/2013   CLINICAL DATA:  Right flank pain with hematuria, nausea and vomiting.  EXAM: CT ABDOMEN AND PELVIS WITHOUT CONTRAST  TECHNIQUE: Multidetector CT imaging of the abdomen and pelvis was performed following the standard protocol without intravenous contrast.  COMPARISON:  None.  FINDINGS: Lung bases show no acute findings. Heart size normal. No pericardial or pleural effusion. Small hiatal hernia.  Liver, gallbladder and adrenal glands are unremarkable. Mild to moderate right hydronephrosis secondary to a 4 mm proximal right ureteral stone (image 30). Tiny left renal stone. Spleen and pancreas are unremarkable. Gastric band is seen on the proximal stomach. Stomach, small bowel and colon are unremarkable. Uterus and ovaries are visualized. There may be trace pelvic free fluid. No pathologically enlarged lymph nodes. Tiny periumbilical hernia contains fat. No worrisome lytic or sclerotic lesions. Subchondral cyst formation is seen in the femoral heads, right greater than left.  IMPRESSION: 1. Mild to moderate right hydronephrosis secondary to a 4 mm proximal right ureteral stone. 2. Tiny left renal stone. 3. Laparoscopic gastric banding with a small hiatal hernia.   Electronically Signed   By: [name] M.D.   On: 06/16/2013 12:42    EKG Interpretation   None       MDM  No diagnosis found. TINNIE KUNIN is a 43 y.o. female here with vomiting, R flank pain. Consider pyelo vs stone. Will get CT ab/pel and labs and UA. Will give pain meds and reassess.   2:18 PM Patient's K 2.2, rechecked. I think likely from vomiting. CT showed 78mm proximal R ureteral stone with hydro. I ordered K supplementation. I called Dr.  Algis Liming, hospitalist, who recommend urology admission. I called Dr. Marthann Schiller, who will admit for hypokalemia, vomiting, renal colic.    Wandra Arthurs, MD 06/16/13 [phone]

## 2013-06-17 DIAGNOSIS — N23 Unspecified renal colic: Secondary | ICD-10-CM

## 2013-06-17 DIAGNOSIS — E876 Hypokalemia: Secondary | ICD-10-CM | POA: Diagnosis present

## 2013-06-17 LAB — URINE CULTURE: Colony Count: >=100000

## 2013-06-17 LAB — COMPREHENSIVE METABOLIC PANEL
ALBUMIN: 2.9 g/dL — AB (ref 3.5–5.2)
ALK PHOS: 51 U/L (ref 39–117)
ALT: 5 U/L (ref 0–35)
AST: 10 U/L (ref 0–37)
BILIRUBIN TOTAL: 0.5 mg/dL (ref 0.3–1.2)
BUN: 8 mg/dL (ref 6–23)
CHLORIDE: 102 meq/L (ref 96–112)
CO2: 26 mEq/L (ref 19–32)
Calcium: 7.9 mg/dL — ABNORMAL LOW (ref 8.4–10.5)
Creatinine, Ser: 1.15 mg/dL — ABNORMAL HIGH (ref 0.50–1.10)
GFR calc Af Amer: 67 mL/min — ABNORMAL LOW (ref >90)
GFR, EST NON AFRICAN AMERICAN: 58 mL/min — AB (ref >90)
Glucose, Bld: 84 mg/dL (ref 70–99)
POTASSIUM: 2.9 meq/L — AB (ref 3.7–5.3)
Sodium: 139 mEq/L (ref 137–147)
Total Protein: 6.2 g/dL (ref 6.0–8.3)

## 2013-06-17 LAB — MAGNESIUM: Magnesium: 2.1 mg/dL (ref 1.5–2.5)

## 2013-06-17 LAB — BASIC METABOLIC PANEL
BUN: 8 mg/dL (ref 6–23)
CO2: 28 mEq/L (ref 19–32)
Calcium: 7.7 mg/dL — ABNORMAL LOW (ref 8.4–10.5)
Chloride: 99 mEq/L (ref 96–112)
Creatinine, Ser: 1.29 mg/dL — ABNORMAL HIGH (ref 0.50–1.10)
GFR calc non Af Amer: 50 mL/min — ABNORMAL LOW (ref >90)
GFR, EST AFRICAN AMERICAN: 58 mL/min — AB (ref >90)
GLUCOSE: 89 mg/dL (ref 70–99)
POTASSIUM: 2.9 meq/L — AB (ref 3.7–5.3)
SODIUM: 136 meq/L — AB (ref 137–147)

## 2013-06-17 MED ORDER — POTASSIUM CHLORIDE CRYS ER 20 MEQ PO TBCR
40.0000 meq | EXTENDED_RELEASE_TABLET | Freq: Two times a day (BID) | ORAL | Status: AC
  Administered 2013-06-17 (×2): 40 meq via ORAL
  Filled 2013-06-17 (×2): qty 2

## 2013-06-17 MED ORDER — KETOROLAC TROMETHAMINE 15 MG/ML IJ SOLN
15.0000 mg | Freq: Four times a day (QID) | INTRAMUSCULAR | Status: DC | PRN
  Administered 2013-06-17 – 2013-06-18 (×3): 15 mg via INTRAVENOUS
  Filled 2013-06-17 (×3): qty 1

## 2013-06-17 MED ORDER — HYDROMORPHONE HCL 2 MG PO TABS: 2.0000 mg | ORAL_TABLET | ORAL | Status: DC | PRN

## 2013-06-17 MED ORDER — POTASSIUM CHLORIDE ER 10 MEQ PO TBCR: 10.0000 meq | EXTENDED_RELEASE_TABLET | Freq: Two times a day (BID) | ORAL | Status: DC

## 2013-06-17 MED ORDER — TAMSULOSIN HCL 0.4 MG PO CAPS: 0.4000 mg | ORAL_CAPSULE | Freq: Every day | ORAL | Status: DC

## 2013-06-17 MED ORDER — PROMETHAZINE HCL 25 MG/ML IJ SOLN: 25.0000 mg | Freq: Once | INTRAMUSCULAR | Status: DC

## 2013-06-17 MED ORDER — POTASSIUM CHLORIDE 10 MEQ/100ML IV SOLN
10.0000 meq | Freq: Once | INTRAVENOUS | Status: AC
  Administered 2013-06-17: 10 meq via INTRAVENOUS
  Filled 2013-06-17: qty 100

## 2013-06-17 NOTE — Consult Note (Signed)
[facility] Medical Consultation  Meagan Dean UTM:[id number] DOB: 02-01-1971 DOA: 06/16/2013 PCP: No PCP Per Patient   Requesting physician: Dr. Diona Fanti Date of consultation: 06/17/13  Reason for consultation: Hypokalemia  Impression/Recommendations Principal Problem:   Hypokalemia Active Problems:   Calculus of ureter  1. Hypokalemia 1. Magnesium levels normal 2. K improved from 2.2 to 2.9 3. Will further replace potassium and repeat BMP in AM 2. Ureteral stone 1. Per primary service  I will followup again tomorrow. Please contact me if I can be of assistance in the meanwhile. Thank you for this consultation.  Chief Complaint: Hypokalemia  HPI:  Pt is a 43yo female who presented with RLQ pain, admitted to the Urology service. The patient monitored with pain mgt. Potassium was noted to be low, replaced, but still at 2.9 by day of consultation. Hospitalist consulted for recs regarding potassium replacement.  Review of Systems:  Per above, the remainder of the 10pt ros reviewed and are neg  Past Medical History  Diagnosis Date  . Asthma   . Hypertension    Past Surgical History  Procedure Laterality Date  . Laparoscopic gastric banding    . Uterine fibroid surgery     Social History:  reports that she quit smoking about 4 years ago. She has never used smokeless tobacco. She reports that she drinks alcohol. She reports that she does not use illicit drugs.  Allergies  Allergen Reactions  . Vicodin [Hydrocodone-Acetaminophen] Other (See Comments)    "very bad reaction"   History reviewed. No pertinent family history.  Prior to Admission medications   Medication Sig Start Date End Date Taking? Authorizing Provider  acetaminophen (TYLENOL) 325 MG tablet Take 650 mg by mouth every 6 (six) hours as needed.   Yes Historical Provider, MD  Cetirizine HCl 10 MG CAPS Take 1 capsule (10 mg total) by mouth at bedtime as needed. 06/06/11  Yes Adlih Moreno-Coll, MD   pseudoephedrine (SUDAFED) 30 MG tablet Take 30 mg by mouth every 4 (four) hours as needed for congestion.   Yes Historical Provider, MD  HYDROmorphone (DILAUDID) 2 MG tablet Take 1 tablet (2 mg total) by mouth every 4 (four) hours as needed for severe pain. 06/17/13   Franchot Gallo, MD  potassium chloride (K-DUR) 10 MEQ tablet Take 1 tablet (10 mEq total) by mouth 2 (two) times daily. 06/17/13   Franchot Gallo, MD  tamsulosin (FLOMAX) 0.4 MG CAPS capsule Take 1 capsule (0.4 mg total) by mouth daily. 06/17/13   Franchot Gallo, MD   Physical Exam: Blood pressure 115/70, pulse 88, temperature 99.5 F (37.5 C), temperature source Oral, resp. rate 18, height 5\' 2"  (1.575 m), weight 66.8 kg (147 lb 4.3 oz), last menstrual period 05/31/2013, SpO2 98.00%. Filed Vitals:   06/16/13 1532 06/16/13 2050 06/17/13 0610 06/17/13 1306  BP: 103/73 142/90 113/64 115/70  Pulse: 60 68 67 88  Temp: 97.4 F (36.3 C) 97.7 F (36.5 C) 98.2 F (36.8 C) 99.5 F (37.5 C)  TempSrc: Oral Oral Oral Oral  Resp: 16 20 20 18   Height: 5\' 2"  (1.575 m)     Weight: 66.8 kg (147 lb 4.3 oz)     SpO2: 100% 100% 100% 98%    General:  Awake, in nad  Eyes: PERRL B  ENT: membranes moist, dentition fair  Neck: trachea midline, neck supple  Cardiovascular: regular, s1, s2  Respiratory: normal resp effort, no wheezing  Abdomen: soft, nondistended  Skin: normal skin turgor, no abnormal skin turgor  Musculoskeletal: perfused, no clubbing  Psychiatric: mood/affect normal // no auditory/visual hallucinations  Neurologic: cn2-12 grossly intact, strength/sensation intact  Labs on Admission:  Basic Metabolic Panel:  Recent Labs Lab 06/16/13 1133 06/16/13 1324 06/17/13 0335 06/17/13 0954 06/17/13 1356  NA 138 140 136* 139  --   K 2.3* 2.2* 2.9* 2.9*  --   CL 96 95* 99 102  --   CO2 32  --  28 26  --   GLUCOSE 95 79 89 84  --   BUN 8 7 8 8   --   CREATININE 1.35* 1.30* 1.29* 1.15*  --   CALCIUM 9.0  --   7.7* 7.9*  --   MG  --   --   --   --  2.1   Liver Function Tests:  Recent Labs Lab 06/16/13 1133 06/17/13 0954  AST 12 10  ALT 7 5  ALKPHOS 67 51  BILITOT 0.7 0.5  PROT 7.9 6.2  ALBUMIN 3.7 2.9*   No results found for this basename: LIPASE, AMYLASE,  in the last 168 hours No results found for this basename: AMMONIA,  in the last 168 hours CBC:  Recent Labs Lab 06/16/13 1133 06/16/13 1324  WBC 9.5  --   NEUTROABS 6.9  --   HGB 11.4* 11.6*  HCT 34.6* 34.0*  MCV 82.0  --   PLT 392  --    Cardiac Enzymes: No results found for this basename: CKTOTAL, CKMB, CKMBINDEX, TROPONINI,  in the last 168 hours BNP: No components found with this basename: POCBNP,  CBG: No results found for this basename: GLUCAP,  in the last 168 hours  Radiological Exams on Admission: Ct Abdomen Pelvis Wo Contrast  06/16/2013   CLINICAL DATA:  Right flank pain with hematuria, nausea and vomiting.  EXAM: CT ABDOMEN AND PELVIS WITHOUT CONTRAST  TECHNIQUE: Multidetector CT imaging of the abdomen and pelvis was performed following the standard protocol without intravenous contrast.  COMPARISON:  None.  FINDINGS: Lung bases show no acute findings. Heart size normal. No pericardial or pleural effusion. Small hiatal hernia.  Liver, gallbladder and adrenal glands are unremarkable. Mild to moderate right hydronephrosis secondary to a 4 mm proximal right ureteral stone (image 30). Tiny left renal stone. Spleen and pancreas are unremarkable. Gastric band is seen on the proximal stomach. Stomach, small bowel and colon are unremarkable. Uterus and ovaries are visualized. There may be trace pelvic free fluid. No pathologically enlarged lymph nodes. Tiny periumbilical hernia contains fat. No worrisome lytic or sclerotic lesions. Subchondral cyst formation is seen in the femoral heads, right greater than left.  IMPRESSION: 1. Mild to moderate right hydronephrosis secondary to a 4 mm proximal right ureteral stone. 2. Tiny  left renal stone. 3. Laparoscopic gastric banding with a small hiatal hernia.   Electronically Signed   By: [name] M.D.   On: 06/16/2013 12:42   Time spent: 53min  Collen Hostler, Orpah Melter [facility] Pager [phone]  If 7PM-7AM, please contact night-coverage [email or web address] Password [facility] 06/17/2013, 6:39 PM

## 2013-06-17 NOTE — Progress Notes (Signed)
Meagan Dean called me back and told me to place order for phenergan and consult for a hospitalist regarding low potassium.  Patient already had a consult in for hospitalist, spoke with [facility] and they will assign MD to patient.

## 2013-06-17 NOTE — Progress Notes (Addendum)
CRITICAL VALUE ALERT  Critical value received: Potassium 2.9  Date of notification:  06/17/13  Time of notification:0520  Critical value read back:yes  Nurse who received alert:  Dellie Catholic  MD notified (1st page): yes  Time of first page: [phone]

## 2013-06-17 NOTE — Discharge Summary (Signed)
Patient ID: Meagan Dean MRN: [id number] DOB/AGE: 43-11-1970 43 y.o.  Admit date: 06/16/2013 Discharge date: 06/17/2013  Primary Care Physician:  No PCP Per Patient  Discharge Diagnoses:   Present on Admission:  . Calculus of ureter  Consults:  None   Discharge Medications:   Medication List         acetaminophen 325 MG tablet  Commonly known as:  TYLENOL  Take 650 mg by mouth every 6 (six) hours as needed.     Cetirizine HCl 10 MG Caps  Take 1 capsule (10 mg total) by mouth at bedtime as needed.     HYDROmorphone 2 MG tablet  Commonly known as:  DILAUDID  Take 1 tablet (2 mg total) by mouth every 4 (four) hours as needed for severe pain.     potassium chloride 10 MEQ tablet  Commonly known as:  K-DUR  Take 1 tablet (10 mEq total) by mouth 2 (two) times daily.     pseudoephedrine 30 MG tablet  Commonly known as:  SUDAFED  Take 30 mg by mouth every 4 (four) hours as needed for congestion.     tamsulosin 0.4 MG Caps capsule  Commonly known as:  FLOMAX  Take 1 capsule (0.4 mg total) by mouth daily.         Significant Diagnostic Studies:  Ct Abdomen Pelvis Wo Contrast  06/16/2013   CLINICAL DATA:  Right flank pain with hematuria, nausea and vomiting.  EXAM: CT ABDOMEN AND PELVIS WITHOUT CONTRAST  TECHNIQUE: Multidetector CT imaging of the abdomen and pelvis was performed following the standard protocol without intravenous contrast.  COMPARISON:  None.  FINDINGS: Lung bases show no acute findings. Heart size normal. No pericardial or pleural effusion. Small hiatal hernia.  Liver, gallbladder and adrenal glands are unremarkable. Mild to moderate right hydronephrosis secondary to a 4 mm proximal right ureteral stone (image 30). Tiny left renal stone. Spleen and pancreas are unremarkable. Gastric band is seen on the proximal stomach. Stomach, small bowel and colon are unremarkable. Uterus and ovaries are visualized. There may be trace pelvic free fluid. No pathologically  enlarged lymph nodes. Tiny periumbilical hernia contains fat. No worrisome lytic or sclerotic lesions. Subchondral cyst formation is seen in the femoral heads, right greater than left.  IMPRESSION: 1. Mild to moderate right hydronephrosis secondary to a 4 mm proximal right ureteral stone. 2. Tiny left renal stone. 3. Laparoscopic gastric banding with a small hiatal hernia.   Electronically Signed   By: [name] M.D.   On: 06/16/2013 12:42    Brief H and P: For complete details please refer to admission H and P, but in brief the patient was admitted for pain management, but primarily for repletion of potassium secondary to emesis from a kidney stone.  Hospital Course:  Active Problems:   Calculus of ureter  the patient was admitted for pain management, and stayed comfortable. On hospital day #1, her potassium had increased to 2.9. She was comfortable, and was discharged home.  Day of Discharge BP 113/64  Pulse 67  Temp(Src) 98.2 F (36.8 C) (Oral)  Resp 20  Ht 5\' 2"  (1.575 m)  Wt 66.8 kg (147 lb 4.3 oz)  BMI 26.93 kg/m2  SpO2 100%  LMP 05/31/2013  Results for orders placed during the hospital encounter of 06/16/13 (from the past 24 hour(s))  CBC WITH DIFFERENTIAL     Status: Abnormal   Collection Time    06/16/13 11:33 AM      Result  Value Range   WBC 9.5  4.0 - 10.5 K/uL   RBC 4.22  3.87 - 5.11 MIL/uL   Hemoglobin 11.4 (*) 12.0 - 15.0 g/dL   HCT 34.6 (*) 36.0 - 46.0 %   MCV 82.0  78.0 - 100.0 fL   MCH 27.0  26.0 - 34.0 pg   MCHC 32.9  30.0 - 36.0 g/dL   RDW 13.6  11.5 - 15.5 %   Platelets 392  150 - 400 K/uL   Neutrophils Relative % 73  43 - 77 %   Neutro Abs 6.9  1.7 - 7.7 K/uL   Lymphocytes Relative 22  12 - 46 %   Lymphs Abs 2.1  0.7 - 4.0 K/uL   Monocytes Relative 5  3 - 12 %   Monocytes Absolute 0.4  0.1 - 1.0 K/uL   Eosinophils Relative 0  0 - 5 %   Eosinophils Absolute 0.0  0.0 - 0.7 K/uL   Basophils Relative 0  0 - 1 %   Basophils Absolute 0.0  0.0 - 0.1  K/uL  COMPREHENSIVE METABOLIC PANEL     Status: Abnormal   Collection Time    06/16/13 11:33 AM      Result Value Range   Sodium 138  137 - 147 mEq/L   Potassium 2.3 (*) 3.7 - 5.3 mEq/L   Chloride 96  96 - 112 mEq/L   CO2 32  19 - 32 mEq/L   Glucose, Bld 95  70 - 99 mg/dL   BUN 8  6 - 23 mg/dL   Creatinine, Ser 1.35 (*) 0.50 - 1.10 mg/dL   Calcium 9.0  8.4 - 10.5 mg/dL   Total Protein 7.9  6.0 - 8.3 g/dL   Albumin 3.7  3.5 - 5.2 g/dL   AST 12  0 - 37 U/L   ALT 7  0 - 35 U/L   Alkaline Phosphatase 67  39 - 117 U/L   Total Bilirubin 0.7  0.3 - 1.2 mg/dL   GFR calc non Af Amer 48 (*) >90 mL/min   GFR calc Af Amer 55 (*) >90 mL/min  URINALYSIS, ROUTINE W REFLEX MICROSCOPIC     Status: Abnormal   Collection Time    06/16/13 12:17 PM      Result Value Range   Color, Urine YELLOW  YELLOW   APPearance CLOUDY (*) CLEAR   Specific Gravity, Urine 1.017  1.005 - 1.030   pH 7.0  5.0 - 8.0   Glucose, UA NEGATIVE  NEGATIVE mg/dL   Hgb urine dipstick LARGE (*) NEGATIVE   Bilirubin Urine NEGATIVE  NEGATIVE   Ketones, ur NEGATIVE  NEGATIVE mg/dL   Protein, ur 30 (*) NEGATIVE mg/dL   Urobilinogen, UA 1.0  0.0 - 1.0 mg/dL   Nitrite NEGATIVE  NEGATIVE   Leukocytes, UA LARGE (*) NEGATIVE  URINE MICROSCOPIC-ADD ON     Status: Abnormal   Collection Time    06/16/13 12:17 PM      Result Value Range   Squamous Epithelial / LPF FEW (*) RARE   WBC, UA 21-50  <3 WBC/hpf   RBC / HPF 21-50  <3 RBC/hpf   Bacteria, UA FEW (*) RARE  URINE CULTURE     Status: None   Collection Time    06/16/13 12:17 PM      Result Value Range   Specimen Description URINE, RANDOM     Special Requests NONE     Culture  Setup Time  Value: 06/16/2013 16:13     Performed at [facility] Count       Value: >=100,000 COLONIES/ML     Performed at [facility]   Culture       Value: ESCHERICHIA COLI     Performed at [facility]   Report Status PENDING    POCT PREGNANCY, URINE      Status: None   Collection Time    06/16/13 12:23 PM      Result Value Range   Preg Test, Ur NEGATIVE  NEGATIVE  POCT I-STAT TROPONIN I     Status: None   Collection Time    06/16/13  1:12 PM      Result Value Range   Troponin i, poc 0.00  0.00 - 0.08 ng/mL   Comment 3           POCT I-STAT, CHEM 8     Status: Abnormal   Collection Time    06/16/13  1:24 PM      Result Value Range   Sodium 140  137 - 147 mEq/L   Potassium 2.2 (*) 3.7 - 5.3 mEq/L   Chloride 95 (*) 96 - 112 mEq/L   BUN 7  6 - 23 mg/dL   Creatinine, Ser 1.30 (*) 0.50 - 1.10 mg/dL   Glucose, Bld 79  70 - 99 mg/dL   Calcium, Ion 1.08 (*) 1.12 - 1.23 mmol/L   TCO2 30  0 - 100 mmol/L   Hemoglobin 11.6 (*) 12.0 - 15.0 g/dL   HCT 34.0 (*) 36.0 - 46.0 %   Comment NOTIFIED PHYSICIAN    BASIC METABOLIC PANEL     Status: Abnormal   Collection Time    06/17/13  3:35 AM      Result Value Range   Sodium 136 (*) 137 - 147 mEq/L   Potassium 2.9 (*) 3.7 - 5.3 mEq/L   Chloride 99  96 - 112 mEq/L   CO2 28  19 - 32 mEq/L   Glucose, Bld 89  70 - 99 mg/dL   BUN 8  6 - 23 mg/dL   Creatinine, Ser 1.29 (*) 0.50 - 1.10 mg/dL   Calcium 7.7 (*) 8.4 - 10.5 mg/dL   GFR calc non Af Amer 50 (*) >90 mL/min   GFR calc Af Amer 58 (*) >90 mL/min    Physical Exam: General: Alert and awake oriented x3 not in any acute distress. HEENT: anicteric sclera, pupils reactive to light and accommodation CVS: S1-S2 clear no murmur rubs or gallops Chest: clear to auscultation bilaterally, no wheezing rales or rhonchi Abdomen: soft nontender, nondistended, normal bowel sounds, no organomegaly Extremities: no cyanosis, clubbing or edema noted bilaterally Neuro: Cranial nerves II-XII intact, no focal neurological deficits  Disposition:  Home  Diet:  No restrictions  Activity:  No restrictions   Disposition and Follow-up:     Discharge Orders   Future Orders Complete By Expires   Discharge patient  As directed        We will followup in  our office in one week  TESTS THAT NEED FOLLOW-UP  None  DISCHARGE FOLLOW-UP Follow-up Information   Follow up with [name], MD. (we'll call you)    Specialty:  Urology   Contact information:   [facility]  [place] [address] [address] [phone]       Time spent on Discharge:  15 minutes  Signed: [name] 06/17/2013,  9:10 AM

## 2013-06-17 NOTE — Progress Notes (Signed)
Spoke with Anderson Malta, Dr. Dahlstedt's nurse about patient's potassium level of 2.9 as well as her recurrent vomiting episodes this morning.  Anderson Malta is going to call the NP Jimmey Ralph who saw her on admission in the ED yesterday. Will continue to monitor patient and report any other findings.

## 2013-06-18 ENCOUNTER — Observation Stay (HOSPITAL_COMMUNITY): Payer: Self-pay | Admitting: Anesthesiology

## 2013-06-18 ENCOUNTER — Encounter (HOSPITAL_COMMUNITY)
Admission: EM | Disposition: A | Discharge status: Home / Self Care | Payer: Self-pay | Source: Home / Self Care | Attending: Emergency Medicine

## 2013-06-18 ENCOUNTER — Encounter (HOSPITAL_COMMUNITY): Payer: MEDICAID | Admitting: Anesthesiology

## 2013-06-18 ENCOUNTER — Observation Stay (HOSPITAL_COMMUNITY): Payer: Self-pay

## 2013-06-18 DIAGNOSIS — N201 Calculus of ureter: Secondary | ICD-10-CM

## 2013-06-18 HISTORY — PX: CYSTOSCOPY WITH RETROGRADE PYELOGRAM, URETEROSCOPY AND STENT PLACEMENT: SHX5789

## 2013-06-18 LAB — BASIC METABOLIC PANEL
BUN: 10 mg/dL (ref 6–23)
CO2: 28 meq/L (ref 19–32)
Calcium: 7.9 mg/dL — ABNORMAL LOW (ref 8.4–10.5)
Chloride: 103 mEq/L (ref 96–112)
Creatinine, Ser: 1.16 mg/dL — ABNORMAL HIGH (ref 0.50–1.10)
GFR calc Af Amer: 66 mL/min — ABNORMAL LOW (ref >90)
GFR calc non Af Amer: 57 mL/min — ABNORMAL LOW (ref >90)
GLUCOSE: 105 mg/dL — AB (ref 70–99)
POTASSIUM: 3.6 meq/L — AB (ref 3.7–5.3)
Sodium: 139 mEq/L (ref 137–147)

## 2013-06-18 SURGERY — CYSTOURETEROSCOPY, WITH RETROGRADE PYELOGRAM AND STENT INSERTION
Anesthesia: General | Laterality: Right

## 2013-06-18 MED ORDER — SODIUM CHLORIDE 0.9 % IR SOLN
Status: DC | PRN
  Administered 2013-06-18: 3000 mL

## 2013-06-18 MED ORDER — SODIUM CHLORIDE 0.9 % IR SOLN
Status: DC | PRN
  Administered 2013-06-18: 1000 mL

## 2013-06-18 MED ORDER — EPHEDRINE SULFATE 50 MG/ML IJ SOLN
INTRAMUSCULAR | Status: DC | PRN
  Administered 2013-06-18: 10 mg via INTRAVENOUS

## 2013-06-18 MED ORDER — LACTATED RINGERS IV SOLN
INTRAVENOUS | Status: DC
  Administered 2013-06-18: 11:00:00 via INTRAVENOUS

## 2013-06-18 MED ORDER — ONDANSETRON HCL 4 MG/2ML IJ SOLN
INTRAMUSCULAR | Status: AC
  Filled 2013-06-18: qty 2

## 2013-06-18 MED ORDER — LIDOCAINE HCL (CARDIAC) 20 MG/ML IV SOLN
INTRAVENOUS | Status: AC
  Filled 2013-06-18: qty 5

## 2013-06-18 MED ORDER — METHYLENE BLUE 1 % INJ SOLN
INTRAMUSCULAR | Status: AC
  Filled 2013-06-18: qty 10

## 2013-06-18 MED ORDER — PROPOFOL 10 MG/ML IV BOLUS
INTRAVENOUS | Status: DC | PRN
  Administered 2013-06-18: 150 mg via INTRAVENOUS

## 2013-06-18 MED ORDER — LACTATED RINGERS IV SOLN: INTRAVENOUS | Status: DC

## 2013-06-18 MED ORDER — OXYBUTYNIN CHLORIDE 5 MG PO TABS
5.0000 mg | ORAL_TABLET | Freq: Three times a day (TID) | ORAL | Status: DC | PRN
  Administered 2013-06-18: 5 mg via ORAL
  Filled 2013-06-18: qty 1

## 2013-06-18 MED ORDER — MIDAZOLAM HCL 5 MG/5ML IJ SOLN
INTRAMUSCULAR | Status: DC | PRN
  Administered 2013-06-18: 1 mg via INTRAVENOUS

## 2013-06-18 MED ORDER — MIDAZOLAM HCL 2 MG/2ML IJ SOLN
INTRAMUSCULAR | Status: AC
  Filled 2013-06-18: qty 2

## 2013-06-18 MED ORDER — LORATADINE 10 MG PO TABS
10.0000 mg | ORAL_TABLET | Freq: Every day | ORAL | Status: DC
  Administered 2013-06-18: 10 mg via ORAL
  Filled 2013-06-18 (×3): qty 1

## 2013-06-18 MED ORDER — PROPOFOL 10 MG/ML IV BOLUS
INTRAVENOUS | Status: AC
  Filled 2013-06-18: qty 20

## 2013-06-18 MED ORDER — KETAMINE HCL 10 MG/ML IJ SOLN
INTRAMUSCULAR | Status: DC | PRN
  Administered 2013-06-18: 20 mg via INTRAVENOUS

## 2013-06-18 MED ORDER — CEFAZOLIN SODIUM 1-5 GM-% IV SOLN
1.0000 g | Freq: Three times a day (TID) | INTRAVENOUS | Status: DC
  Filled 2013-06-18: qty 50

## 2013-06-18 MED ORDER — CIPROFLOXACIN HCL 250 MG PO TABS: 250.0000 mg | ORAL_TABLET | Freq: Two times a day (BID) | ORAL | Status: DC

## 2013-06-18 MED ORDER — IOHEXOL 300 MG/ML  SOLN
INTRAMUSCULAR | Status: DC | PRN
  Administered 2013-06-18: 6 mL

## 2013-06-18 MED ORDER — FENTANYL CITRATE 0.05 MG/ML IJ SOLN
INTRAMUSCULAR | Status: DC | PRN
  Administered 2013-06-18: 100 ug via INTRAVENOUS

## 2013-06-18 MED ORDER — PSEUDOEPHEDRINE HCL 30 MG PO TABS
30.0000 mg | ORAL_TABLET | ORAL | Status: DC | PRN
  Filled 2013-06-18: qty 1

## 2013-06-18 MED ORDER — CEFAZOLIN SODIUM-DEXTROSE 2-3 GM-% IV SOLR
2.0000 g | INTRAVENOUS | Status: AC
  Administered 2013-06-18: 2 g via INTRAVENOUS
  Filled 2013-06-18: qty 50

## 2013-06-18 MED ORDER — ACETAMINOPHEN 325 MG PO TABS: 650.0000 mg | ORAL_TABLET | ORAL | Status: DC | PRN

## 2013-06-18 MED ORDER — ONDANSETRON HCL 4 MG/2ML IJ SOLN
INTRAMUSCULAR | Status: DC | PRN
  Administered 2013-06-18 (×2): 2 mg via INTRAVENOUS

## 2013-06-18 MED ORDER — CEFAZOLIN SODIUM 1-5 GM-% IV SOLN
1.0000 g | Freq: Three times a day (TID) | INTRAVENOUS | Status: DC
  Administered 2013-06-18 – 2013-06-19 (×2): 1 g via INTRAVENOUS
  Filled 2013-06-18 (×4): qty 50

## 2013-06-18 MED ORDER — LACTATED RINGERS IV SOLN
INTRAVENOUS | Status: DC | PRN
  Administered 2013-06-18: 11:00:00 via INTRAVENOUS

## 2013-06-18 MED ORDER — LIDOCAINE HCL 2 % EX GEL
CUTANEOUS | Status: AC
  Filled 2013-06-18: qty 10

## 2013-06-18 MED ORDER — FENTANYL CITRATE 0.05 MG/ML IJ SOLN
INTRAMUSCULAR | Status: AC
  Filled 2013-06-18: qty 2

## 2013-06-18 MED ORDER — FENTANYL CITRATE 0.05 MG/ML IJ SOLN: 25.0000 ug | INTRAMUSCULAR | Status: DC | PRN

## 2013-06-18 MED ORDER — SULFAMETHOXAZOLE-TMP DS 800-160 MG PO TABS
1.0000 | ORAL_TABLET | Freq: Two times a day (BID) | ORAL | Status: DC
  Filled 2013-06-18 (×2): qty 1

## 2013-06-18 SURGICAL SUPPLY — 19 items
BAG URO CATCHER STRL LF (DRAPE) ×3 IMPLANT
CATH INTERMIT  6FR 70CM (CATHETERS) ×2 IMPLANT
CATH URET 5FR 28IN CONE TIP (BALLOONS)
CATH URET 5FR 70CM CONE TIP (BALLOONS) ×1 IMPLANT
CLOTH BEACON ORANGE TIMEOUT ST (SAFETY) ×3 IMPLANT
DRAPE CAMERA CLOSED 9X96 (DRAPES) ×3 IMPLANT
GLOVE BIOGEL M 8.0 STRL (GLOVE) ×3 IMPLANT
GOWN STRL REUS W/ TWL LRG LVL3 (GOWN DISPOSABLE) IMPLANT
GOWN STRL REUS W/ TWL XL LVL3 (GOWN DISPOSABLE) ×1 IMPLANT
GOWN STRL REUS W/TWL LRG LVL3 (GOWN DISPOSABLE) ×3
GOWN STRL REUS W/TWL XL LVL3 (GOWN DISPOSABLE) ×6 IMPLANT
GUIDEWIRE ANG ZIPWIRE 038X150 (WIRE) ×2 IMPLANT
GUIDEWIRE STR DUAL SENSOR (WIRE) ×3 IMPLANT
MANIFOLD NEPTUNE II (INSTRUMENTS) ×3 IMPLANT
PACK CYSTO (CUSTOM PROCEDURE TRAY) ×3 IMPLANT
STENT CONTOUR 6FRX24X.038 (STENTS) ×2 IMPLANT
TUBING CONNECTING 10 (TUBING) ×2 IMPLANT
TUBING CONNECTING 10' (TUBING) ×1
WIRE COONS/BENSON .038X145CM (WIRE) ×1 IMPLANT

## 2013-06-18 NOTE — Progress Notes (Signed)
INITIAL NUTRITION ASSESSMENT  DOCUMENTATION CODES Per approved criteria  -Not Applicable   INTERVENTION: -Diet advancement per MD -Encouraged bland/easy to tolerate foods for recent nausea  -Will continue to monitor  NUTRITION DIAGNOSIS: Inadequate oral intake related to inability to eat as evidenced by NPO status.   Goal: Pt to meet >/= 90% of their estimated nutrition needs as diet advancement tolerated  Monitor:  Diet order, total protein/energy intake, GI profile, labs, weights  Reason for Assessment: MST  43 y.o. female  Admitting Dx: Hypokalemia  ASSESSMENT: Healthy 43 YO AA female seen in the ER today for right flank pain radiating into RLQ. Has had (+) gross hematuria but denies previous history of nephrolithiasis. No FM of stone disease. Has taken OTC sinus medication. Denies fever or chills.   -Pt with unintentional wt loss of 10 lbs since 03/2013 d/t combination of life stressors and acute incident of nausea/vomiting -Has been unable to tolerate foods and/or liquids for one week d/t nausea -MD noted pt with obstructing right ureteral stone. Underwent stent placement. NPO. Diet advancement pending -Recommend pt trial bland foods and incorporate Ensure/Boost supplements into diet if pt appetite continues to decline -Declined any nutrition education handouts regarding nausea/vomiting, but appreciated RD assessment -Will continue to monitor diet advancement and PO intake, supplement PRN   Height: Ht Readings from Last 1 Encounters:  06/16/13 5\' 2"  (1.575 m)    Weight: Wt Readings from Last 1 Encounters:  06/16/13 147 lb 4.3 oz (66.8 kg)    Ideal Body Weight: 110 lbs  % Ideal Body Weight: 134%  Wt Readings from Last 10 Encounters:  06/16/13 147 lb 4.3 oz (66.8 kg)  06/16/13 147 lb 4.3 oz (66.8 kg)    Usual Body Weight: 157 lbs  % Usual Body Weight: 94%  BMI:  Body mass index is 26.93 kg/(m^2). Overweight  Estimated Nutritional Needs: Kcal:  1400-1600  Protein: 60-70 gram Fluid: >/=2300 ml/daily  Skin: Incision on perineum  Diet Order:   NPO  EDUCATION NEEDS: -Education needs addressed   Intake/Output Summary (Last 24 hours) at 06/18/13 1436 Last data filed at 06/18/13 1415  Gross per 24 hour  Intake 4578.34 ml  Output   1704 ml  Net 2874.34 ml    Last BM: 2/05   Labs:   Recent Labs Lab 06/17/13 0335 06/17/13 0954 06/17/13 1356 06/18/13 0328  NA 136* 139  --  139  K 2.9* 2.9*  --  3.6*  CL 99 102  --  103  CO2 28 26  --  28  BUN 8 8  --  10  CREATININE 1.29* 1.15*  --  1.16*  CALCIUM 7.7* 7.9*  --  7.9*  MG  --   --  2.1  --   GLUCOSE 89 84  --  105*    CBG (last 3)  No results found for this basename: GLUCAP,  in the last 72 hours  Scheduled Meds: .  ceFAZolin (ANCEF) IV  1 g Intravenous Q8H  . docusate sodium  100 mg Oral BID  . heparin  5,000 Units Subcutaneous Q8H  . loratadine  10 mg Oral Daily  . promethazine  25 mg Intravenous Once    Continuous Infusions: . 0.45 % NaCl with KCl 20 mEq / L 100 mL/hr at 06/18/13 1335    Past Medical History  Diagnosis Date  . Asthma   . Hypertension     Past Surgical History  Procedure Laterality Date  . Laparoscopic gastric banding    .  Uterine fibroid surgery      Atlee Abide MS RD LDN Clinical Dietitian [phone]

## 2013-06-18 NOTE — Anesthesia Postprocedure Evaluation (Signed)
  Anesthesia Post-op Note  Patient: Meagan Dean  Procedure(s) Performed: Procedure(s) (LRB): CYSTOSCOPY WITH RETROGRADE PYELOGRAM, URETEROSCOPY AND STENT PLACEMENT (Right)  Patient Location: PACU  Anesthesia Type: General  Level of Consciousness: awake and alert   Airway and Oxygen Therapy: Patient Spontanous Breathing  Post-op Pain: mild  Post-op Assessment: Post-op Vital signs reviewed, Patient's Cardiovascular Status Stable, Respiratory Function Stable, Patent Airway and No signs of Nausea or vomiting  Last Vitals:  Filed Vitals:   06/18/13 1315  BP: 115/74  Pulse: 76  Temp: 36.8 C  Resp:     Post-op Vital Signs: stable   Complications: No apparent anesthesia complications

## 2013-06-18 NOTE — Progress Notes (Signed)
ANTIBIOTIC CONSULT NOTE - INITIAL  Pharmacy Consult for Ancef Indication: UTI  Allergies  Allergen Reactions  . Vicodin [Hydrocodone-Acetaminophen] Other (See Comments)    "very bad reaction"    Patient Measurements: Height: 5\' 2"  (157.5 cm) Weight: 147 lb 4.3 oz (66.8 kg) IBW/kg (Calculated) : 50.1  Vital Signs: Temp: 98.6 F (37 C) (02/06 0506) Temp src: Oral (02/06 0506) BP: 109/72 mmHg (02/06 0506) Pulse Rate: 74 (02/06 0506) Intake/Output from previous day: 02/05 0701 - 02/06 0700 In: 3662 [P.O.:600; I.V.:1175] Out: 1350 [Urine:1350] Intake/Output from this shift:    Labs:  Recent Labs  06/16/13 1133 06/16/13 1324 06/17/13 0335 06/17/13 0954 06/18/13 0328  WBC 9.5  --   --   --   --   HGB 11.4* 11.6*  --   --   --   PLT 392  --   --   --   --   CREATININE 1.35* 1.30* 1.29* 1.15* 1.16*   Estimated Creatinine Clearance: 56.7 ml/min (by C-G formula based on Cr of 1.16). No results found for this basename: VANCOTROUGH, Corlis Leak, VANCORANDOM, GENTTROUGH, GENTPEAK, GENTRANDOM, TOBRATROUGH, TOBRAPEAK, TOBRARND, AMIKACINPEAK, AMIKACINTROU, AMIKACIN,  in the last 72 hours   Microbiology: Recent Results (from the past 720 hour(s))  URINE CULTURE     Status: None   Collection Time    06/16/13 12:17 PM      Result Value Range Status   Specimen Description URINE, RANDOM   Final   Special Requests NONE   Final   Culture  Setup Time     Final   Value: 06/16/2013 16:13     Performed at [place]     Final   Value: >=100,000 COLONIES/ML     Performed at [facility]   Culture     Final   Value: ESCHERICHIA COLI     Performed at [facility]   Report Status 06/17/2013 FINAL   Final   Organism ID, Bacteria ESCHERICHIA COLI   Final    Medical History: Past Medical History  Diagnosis Date  . Asthma   . Hypertension     Assessment: 57 yoF admitted with ureteral stone with right hydronephrosis and left renal calculus.   Plan for OR today for cystoscopy.  Urine culture growing E.coli (pan-sensitive).  SCr 1.16, CrCl~56 ml/min  Afebrile, WBC WNL  Goal of Therapy:  Eradication of infection  Plan:  1.  Ancef 2g IV 60 minutes pre-procedure then will begin 1g IV q8h.  Hershal Coria 06/18/2013,7:59 AM

## 2013-06-18 NOTE — Progress Notes (Signed)
Subjective: Patient reports continued pain, N/V  Objective: Vital signs in last 24 hours: Temp:  [98.1 F (36.7 C)-99.5 F (37.5 C)] 98.6 F (37 C) (02/06 0506) Pulse Rate:  [71-88] 74 (02/06 0506) Resp:  [18] 18 (02/06 0506) BP: (109-131)/(70-84) 109/72 mmHg (02/06 0506) SpO2:  [98 %-100 %] 100 % (02/06 0506)  Intake/Output from previous day: 02/05 0701 - 02/06 0700 In: 1775 [P.O.:600; I.V.:1175] Out: 1350 [Urine:1350] Intake/Output this shift:    Physical Exam:  Constitutional: Vital signs reviewed. WD WN in NAD   Eyes: PERRL, No scleral icterus Pulmonary/Chest: Normal effort  Lab Results:  Recent Labs  06/16/13 1133 06/16/13 1324  HGB 11.4* 11.6*  HCT 34.6* 34.0*   BMET  Recent Labs  06/17/13 0954 06/18/13 0328  NA 139 139  K 2.9* 3.6*  CL 102 103  CO2 26 28  GLUCOSE 84 105*  BUN 8 10  CREATININE 1.15* 1.16*  CALCIUM 7.9* 7.9*   No results found for this basename: LABPT, INR,  in the last 72 hours No results found for this basename: LABURIN,  in the last 72 hours Results for orders placed during the hospital encounter of 06/16/13  URINE CULTURE     Status: None   Collection Time    06/16/13 12:17 PM      Result Value Range Status   Specimen Description URINE, RANDOM   Final   Special Requests NONE   Final   Culture  Setup Time     Final   Value: 06/16/2013 16:13     Performed at [place]     Final   Value: >=100,000 COLONIES/ML     Performed at [facility]   Culture     Final   Value: ESCHERICHIA COLI     Performed at [facility]   Report Status 06/17/2013 FINAL   Final   Organism ID, Bacteria ESCHERICHIA COLI   Final    Studies/Results: Ct Abdomen Pelvis Wo Contrast  06/16/2013   CLINICAL DATA:  Right flank pain with hematuria, nausea and vomiting.  EXAM: CT ABDOMEN AND PELVIS WITHOUT CONTRAST  TECHNIQUE: Multidetector CT imaging of the abdomen and pelvis was performed following the standard  protocol without intravenous contrast.  COMPARISON:  None.  FINDINGS: Lung bases show no acute findings. Heart size normal. No pericardial or pleural effusion. Small hiatal hernia.  Liver, gallbladder and adrenal glands are unremarkable. Mild to moderate right hydronephrosis secondary to a 4 mm proximal right ureteral stone (image 30). Tiny left renal stone. Spleen and pancreas are unremarkable. Gastric band is seen on the proximal stomach. Stomach, small bowel and colon are unremarkable. Uterus and ovaries are visualized. There may be trace pelvic free fluid. No pathologically enlarged lymph nodes. Tiny periumbilical hernia contains fat. No worrisome lytic or sclerotic lesions. Subchondral cyst formation is seen in the femoral heads, right greater than left.  IMPRESSION: 1. Mild to moderate right hydronephrosis secondary to a 4 mm proximal right ureteral stone. 2. Tiny left renal stone. 3. Laparoscopic gastric banding with a small hiatal hernia.   Electronically Signed   By: [name] M.D.   On: 06/16/2013 12:42   K better this am Assessment/Plan:   Continued pain, N/V from stone  Will make NPO, take to OR for cysto, possible stone extraction vs stent alone. Urine culture +. Have started on ancef  I have discussed procedure w/ pt   LOS: 2 days   Meagan Dean, Meagan Dean M  06/18/2013, 7:50 AM

## 2013-06-18 NOTE — Transfer of Care (Signed)
Immediate Anesthesia Transfer of Care Note  Patient: Meagan Dean  Procedure(s) Performed: Procedure(s): CYSTOSCOPY WITH RETROGRADE PYELOGRAM, URETEROSCOPY AND STENT PLACEMENT (Right)  Patient Location: PACU  Anesthesia Type:General  Level of Consciousness: awake and sedated  Airway & Oxygen Therapy: Patient Spontanous Breathing and Patient connected to face mask oxygen  Post-op Assessment: Report given to PACU RN and Post -op Vital signs reviewed and stable  Post vital signs: stable  Complications: No apparent anesthesia complications

## 2013-06-18 NOTE — Op Note (Signed)
Preoperative diagnosis: Obstructing right ureteral stone with nausea and vomiting and UTI  Postoperative diagnosis: Same   Procedure: Cystoscopy, right retrograde ureteropyelogram, right distal ureteroscopy, placement of double-J stent-6 [place] by 24 cm contour without tether    Surgeon: [name]. Tomoko Sandra, M.D.   Anesthesia: Gen.   Complications: None  Specimen(s): None  Drain(s): 24 cm 6 [place] contour double-J stent without tether  Indications: 43 year old female admitted with hypokalemia secondary to nausea and vomiting secondary to a right proximal ureteral stone that is still symptomatic. She presents at this time for stent placement. She was found to have a UTI, although she was not febrile and did not have leukocytosis. Because of her persistent nausea and vomiting and hospital stay, it was recommended that the stent be placed, and that the stone was quite distal, we could try to basket. Otherwise, stent placement for decompression of her kidney and symptomatic relief can be done at this time with a second stage procedure to be done at later time. She understands the procedure, risks and complications, and desires to proceed.    Technique and findings: The patient was met in the holding area. Surgical site was correctly marked. She received preoperative IV Ancef. She was taken to the operating room where general anesthetic was administered with the LMA. She was placed in the dorsolithotomy position. Genitalia and perineum were prepped and draped. Proper timeout was performed.  A 22 French panendoscope was advanced into her bladder. The bladder was drained, and cystoscopically examined. Both ureteral orifices were normal in configuration and location. There were no tumors, trabeculations or foreign bodies. The right ureteral orifice was cannulated with a 6 [place] open-ended catheter. Gentle retrograde pyelogram was performed.  This revealed a filling defect in the mid ureter  approximately 4-5 mm mild columnization and caliectasis proximal. No further filling defects were seen. I tried navigating a Center tip guidewire through the open-ended catheter following beats retrograde. This was unsuccessful, as was passing a angle tip wire. I then felt at this point that I needed to negotiate a ureteroscope into the orifice to adequately position the guidewire. The cystoscope was removed and replaced with the ureteroscope. A false passage was present in the distal ureter. I navigated past this, into the more proximal ureter, at which point I then passed a guidewire all the way up past the obstructing stone the right renal collecting system. The ureteroscope was removed, and cystoscopically, I replaced that previously mentioned double-J stent with the proximal in position in the right renal pelvis and the distal end within the bladder after the guidewire was removed. At this point, the bladder was decompressed and the scope removed.  The patient tolerated procedure well. She was awakened and taken to the PACU in stable condition.

## 2013-06-18 NOTE — Evaluation (Signed)
I have reviewed the student nurse documentation.

## 2013-06-18 NOTE — Progress Notes (Signed)
[facility] PROGRESS NOTE  Meagan Dean PJK:[id number] DOB: 28-Oct-1970 DOA: 06/16/2013 PCP: No PCP Per Patient  Assessment/Plan:  1. Hypokalemia  1. Magnesium levels normal 2. K improved from 2.2 to 3.6 3. Pt has remained medically stable and is OK to discharge home from my standpoint 4. Will sign off for now. Please call if there are any further questions. 2. Ureteral stone  1. Per primary service 2. S/p stent placement today  Code Status: Full Family Communication: Pt in room (indicate person spoken with, relationship, and if by phone, the number) Disposition Plan: Pending   HPI/Subjective: No acute events. Pt is s/p stent placement  Objective: Filed Vitals:   06/18/13 1236 06/18/13 1245 06/18/13 1300 06/18/13 1315  BP: 120/68 126/82 119/69 115/74  Pulse: 62 57 52 76  Temp: 97.7 F (36.5 C)  97 F (36.1 C) 98.3 F (36.8 C)  TempSrc:      Resp: 9 17 12    Height:      Weight:      SpO2: 100% 100% 100% 100%    Intake/Output Summary (Last 24 hours) at 06/18/13 1320 Last data filed at 06/18/13 1300  Gross per 24 hour  Intake 4578.34 ml  Output   1754 ml  Net 2824.34 ml   Filed Weights   06/16/13 1532  Weight: 66.8 kg (147 lb 4.3 oz)    Exam:   General:  Awake, in nad  Cardiovascular: regular, s1, s2  Respiratory: normal resp effort, no wheezing  Abdomen: soft, nondistended  Musculoskeletal: perfused, no clubbing   Data Reviewed: Basic Metabolic Panel:  Recent Labs Lab 06/16/13 1133 06/16/13 1324 06/17/13 0335 06/17/13 0954 06/17/13 1356 06/18/13 0328  NA 138 140 136* 139  --  139  K 2.3* 2.2* 2.9* 2.9*  --  3.6*  CL 96 95* 99 102  --  103  CO2 32  --  28 26  --  28  GLUCOSE 95 79 89 84  --  105*  BUN 8 7 8 8   --  10  CREATININE 1.35* 1.30* 1.29* 1.15*  --  1.16*  CALCIUM 9.0  --  7.7* 7.9*  --  7.9*  MG  --   --   --   --  2.1  --    Liver Function Tests:  Recent Labs Lab 06/16/13 1133 06/17/13 0954  AST 12 10  ALT 7 5   ALKPHOS 67 51  BILITOT 0.7 0.5  PROT 7.9 6.2  ALBUMIN 3.7 2.9*   No results found for this basename: LIPASE, AMYLASE,  in the last 168 hours No results found for this basename: AMMONIA,  in the last 168 hours CBC:  Recent Labs Lab 06/16/13 1133 06/16/13 1324  WBC 9.5  --   NEUTROABS 6.9  --   HGB 11.4* 11.6*  HCT 34.6* 34.0*  MCV 82.0  --   PLT 392  --    Cardiac Enzymes: No results found for this basename: CKTOTAL, CKMB, CKMBINDEX, TROPONINI,  in the last 168 hours BNP (last 3 results) No results found for this basename: PROBNP,  in the last 8760 hours CBG: No results found for this basename: GLUCAP,  in the last 168 hours  Recent Results (from the past 240 hour(s))  URINE CULTURE     Status: None   Collection Time    06/16/13 12:17 PM      Result Value Range Status   Specimen Description URINE, RANDOM   Final   Special Requests NONE  Final   Culture  Setup Time     Final   Value: 06/16/2013 16:13     Performed at [place]     Final   Value: >=100,000 COLONIES/ML     Performed at [facility]   Culture     Final   Value: ESCHERICHIA COLI     Performed at [facility]   Report Status 06/17/2013 FINAL   Final   Organism ID, Bacteria ESCHERICHIA COLI   Final     Studies: No results found.  Scheduled Meds: . [MAR HOLD]  ceFAZolin (ANCEF) IV  1 g Intravenous Q8H  . [facility] HOLD] docusate sodium  100 mg Oral BID  . [MAR HOLD] heparin  5,000 Units Subcutaneous Q8H  . loratadine  10 mg Oral Daily  . [facility] HOLD] promethazine  25 mg Intravenous Once   Continuous Infusions: . 0.45 % NaCl with KCl 20 mEq / L 100 mL/hr at 06/18/13 0034  . lactated ringers      Principal Problem:   Hypokalemia Active Problems:   Calculus of ureter  Time spent: 57min  [facility], [facility] Pager [phone]. If 7PM-7AM, please contact night-coverage at [email or web address], password [facility] 06/18/2013, 1:20 PM  LOS: 2 days

## 2013-06-18 NOTE — Anesthesia Preprocedure Evaluation (Addendum)
Anesthesia Evaluation  Patient identified by MRN, date of birth, ID band Patient awake    Reviewed: Allergy & Precautions, H&P , NPO status , Patient's Chart, lab work & pertinent test results  Airway Mallampati: II TM Distance: >3 FB Neck ROM: full    Dental no notable dental hx. (+) Teeth Intact and Dental Advisory Given   Pulmonary asthma , former smoker,  breath sounds clear to auscultation  Pulmonary exam normal       Cardiovascular Exercise Tolerance: Good hypertension, Rhythm:regular Rate:Normal     Neuro/Psych negative neurological ROS  negative psych ROS   GI/Hepatic negative GI ROS, Neg liver ROS,   Endo/Other  negative endocrine ROS  Renal/GU negative Renal ROS  negative genitourinary   Musculoskeletal negative musculoskeletal ROS (+)   Abdominal   Peds negative pediatric ROS (+)  Hematology negative hematology ROS (+)   Anesthesia Other Findings   Reproductive/Obstetrics negative OB ROS                          Anesthesia Physical Anesthesia Plan  ASA: II  Anesthesia Plan: General   Post-op Pain Management:    Induction: Intravenous  Airway Management Planned: LMA  Additional Equipment:   Intra-op Plan:   Post-operative Plan:   Informed Consent: I have reviewed the patients History and Physical, chart, labs and discussed the procedure including the risks, benefits and alternatives for the proposed anesthesia with the patient or authorized representative who has indicated his/her understanding and acceptance.   Dental Advisory Given  Plan Discussed with: CRNA and Surgeon  Anesthesia Plan Comments:         Anesthesia Quick Evaluation

## 2013-06-19 MED ORDER — POTASSIUM CHLORIDE ER 10 MEQ PO TBCR: 10.0000 meq | EXTENDED_RELEASE_TABLET | Freq: Two times a day (BID) | ORAL | Status: DC

## 2013-06-19 MED ORDER — OXYCODONE-ACETAMINOPHEN 5-325 MG PO TABS: 1.0000 | ORAL_TABLET | ORAL | Status: DC | PRN

## 2013-06-19 MED ORDER — CEPHALEXIN 500 MG PO CAPS: 500.0000 mg | ORAL_CAPSULE | Freq: Two times a day (BID) | ORAL | Status: DC

## 2013-06-19 MED ORDER — SENNOSIDES-DOCUSATE SODIUM 8.6-50 MG PO TABS: 1.0000 | ORAL_TABLET | Freq: Two times a day (BID) | ORAL | Status: DC

## 2013-06-19 NOTE — Discharge Summary (Signed)
Physician Discharge Summary  Patient ID: Meagan Dean MRN: [id number] DOB/AGE: Oct 05, 1970 43 y.o.  Admit date: 06/16/2013 Discharge date: 06/19/2013  Admission Diagnoses: Rt Ureteral Stone  Discharge Diagnoses:  Principal Problem:   Hypokalemia Active Problems:   Calculus of ureter   Discharged Condition: good  Hospital Course:   1 - Rt Ureteral Stone - Admitted through ER 2415 for refractory Rt renal colic. Rt ureteral stent placed 06/18/13 as temporizing measure to definitive management.  2 - Bacteruria - Pt with E. Coli (pan-sensitive) bacteruria. Remain on IV ABX in house and afebrile, will be DC'd with continued course.  3 - Hypokalemia - K low (2.3) on admit. Hospitalist consult obtained and trended toward normal (most recent 3.6) prior to DC. Will be RX'd potassium replacemetn at discharge.  By 2/7, the day of discharge, pts' pain controlled, voiding to completion, and felt to be adequate for discharge.   Consults: internal medicine  Significant Diagnostic Studies: labs: K 3.6 at discharge  Treatments: surgery: Rt ureteral stent placed 06/18/13  Discharge Exam: Blood pressure 100/69, pulse 74, temperature 97.8 F (36.6 C), temperature source Oral, resp. rate 18, height 5\' 2"  (1.575 m), weight 66.8 kg (147 lb 4.3 oz), last menstrual period 05/31/2013, SpO2 100.00%. General appearance: alert, cooperative and appears stated age Head: Normocephalic, without obvious abnormality, atraumatic Eyes: conjunctivae/corneas clear. PERRL, EOM's intact. Fundi benign. Ears: normal TM's and external ear canals both ears Nose: Nares normal. Septum midline. Mucosa normal. No drainage or sinus tenderness. Throat: lips, mucosa, and tongue normal; teeth and gums normal Neck: no adenopathy, no carotid bruit, no JVD, supple, symmetrical, trachea midline and thyroid not enlarged, symmetric, no tenderness/mass/nodules Back: symmetric, no curvature. ROM normal. No CVA tenderness. Resp: clear to  auscultation bilaterally Chest wall: no tenderness Cardio: regular rate and rhythm, S1, S2 normal, no murmur, click, rub or gallop GI: soft, non-tender; bowel sounds normal; no masses,  no organomegaly Pelvic: external genitalia normal and No CVAT Extremities: extremities normal, atraumatic, no cyanosis or edema Pulses: 2+ and symmetric Skin: Skin color, texture, turgor normal. No rashes or lesions Lymph nodes: Cervical, supraclavicular, and axillary nodes normal. Neurologic: Grossly normal  Disposition: 01-Home or Self Care  Discharge Orders   Future Appointments Provider Department Dept Phone   07/03/2013 9:30 AM [facility] Covering Provider [place] [phone]   Future Orders Complete By Expires   Discharge patient  As directed        Medication List         acetaminophen 325 MG tablet  Commonly known as:  TYLENOL  Take 650 mg by mouth every 6 (six) hours as needed.     cephALEXin 500 MG capsule  Commonly known as:  KEFLEX  Take 1 capsule (500 mg total) by mouth 2 (two) times daily. X 3 days now for urinary infection. Also begin 2 days before next Urology surgery.     Cetirizine HCl 10 MG Caps  Take 1 capsule (10 mg total) by mouth at bedtime as needed.     oxyCODONE-acetaminophen 5-325 MG per tablet  Commonly known as:  ROXICET  Take 1 tablet by mouth every 4 (four) hours as needed for severe pain. Post-operatively     potassium chloride 10 MEQ tablet  Commonly known as:  K-DUR  Take 1 tablet (10 mEq total) by mouth 2 (two) times daily.     pseudoephedrine 30 MG tablet  Commonly known as:  SUDAFED  Take 30 mg by mouth every 4 (  four) hours as needed for congestion.     senna-docusate 8.6-50 MG per tablet  Commonly known as:  Senokot-S  Take 1 tablet by mouth 2 (two) times daily. While taking pain meds to prevent constipation     tamsulosin 0.4 MG Caps capsule  Commonly known as:  FLOMAX  Take 1 capsule (0.4 mg total) by mouth  daily.           Follow-up Information   Follow up with DAHLSTEDT, [name], MD. (we'll call you)    Specialty:  Urology   Contact information:   [facility]  [place] [address] [phone]       Follow up with Angelica Chessman, MD. (appointment for 9:30 AM )    Specialty:  Internal Medicine   Contact information:   [place] [address] [phone]       Signed: Alexis Frock 06/19/2013, 8:45 AM

## 2013-06-19 NOTE — Discharge Instructions (Signed)
1. Fill your prescription for potassium, percocet (the pain medicine) as well as tamsulosin, which will help the stone pass more quickly  2. Call the office if you experience fever over 101, had difficulty keeping food or fluids down, or pain medicines not working  3. We will call you to set up an appointment for followup next week with a nurse practitioner  4. You may have unlimited activities and normal diet if you're feeling fine.

## 2013-06-21 ENCOUNTER — Encounter (HOSPITAL_COMMUNITY): Payer: Self-pay | Admitting: Urology

## 2013-06-24 ENCOUNTER — Other Ambulatory Visit: Payer: Self-pay | Admitting: Urology

## 2013-06-25 ENCOUNTER — Encounter (HOSPITAL_BASED_OUTPATIENT_CLINIC_OR_DEPARTMENT_OTHER): Payer: Self-pay | Admitting: *Deleted

## 2013-07-01 ENCOUNTER — Encounter (HOSPITAL_BASED_OUTPATIENT_CLINIC_OR_DEPARTMENT_OTHER): Payer: Self-pay | Admitting: *Deleted

## 2013-07-01 NOTE — Progress Notes (Signed)
NPO AFTER MN. ARRIVE AT 0700. NEEDS ISTAT 8 AND URINE PREG. MAY TAKE PAIN RX IF NEEDED AM DOS W/ SIPS OF WATER.

## 2013-07-03 ENCOUNTER — Ambulatory Visit: Payer: Self-pay | Attending: Internal Medicine | Admitting: Internal Medicine

## 2013-07-03 VITALS — BP 112/74 | HR 80 | Temp 98.3°F | Ht 62.0 in | Wt 148.6 lb

## 2013-07-03 DIAGNOSIS — N201 Calculus of ureter: Secondary | ICD-10-CM | POA: Insufficient documentation

## 2013-07-03 DIAGNOSIS — E876 Hypokalemia: Secondary | ICD-10-CM | POA: Insufficient documentation

## 2013-07-03 DIAGNOSIS — Z0000 Encounter for general adult medical examination without abnormal findings: Secondary | ICD-10-CM

## 2013-07-03 DIAGNOSIS — Z9889 Other specified postprocedural states: Secondary | ICD-10-CM | POA: Insufficient documentation

## 2013-07-03 LAB — CBC WITH DIFFERENTIAL/PLATELET
BASOS ABS: 0.1 10*3/uL (ref 0.0–0.1)
Basophils Relative: 1 % (ref 0–1)
EOS ABS: 0.2 10*3/uL (ref 0.0–0.7)
Eosinophils Relative: 2 % (ref 0–5)
HCT: 35.7 % — ABNORMAL LOW (ref 36.0–46.0)
Hemoglobin: 12 g/dL (ref 12.0–15.0)
LYMPHS ABS: 1.9 10*3/uL (ref 0.7–4.0)
Lymphocytes Relative: 24 % (ref 12–46)
MCH: 27.5 pg (ref 26.0–34.0)
MCHC: 33.6 g/dL (ref 30.0–36.0)
MCV: 81.9 fL (ref 78.0–100.0)
Monocytes Absolute: 0.4 10*3/uL (ref 0.1–1.0)
Monocytes Relative: 5 % (ref 3–12)
Neutro Abs: 5.4 10*3/uL (ref 1.7–7.7)
Neutrophils Relative %: 68 % (ref 43–77)
PLATELETS: 501 10*3/uL — AB (ref 150–400)
RBC: 4.36 MIL/uL (ref 3.87–5.11)
RDW: 14.9 % (ref 11.5–15.5)
WBC: 7.9 10*3/uL (ref 4.0–10.5)

## 2013-07-03 LAB — COMPLETE METABOLIC PANEL WITH GFR
AST: 9 U/L (ref 0–37)
Albumin: 4 g/dL (ref 3.5–5.2)
Alkaline Phosphatase: 52 U/L (ref 39–117)
BUN: 6 mg/dL (ref 6–23)
CO2: 26 meq/L (ref 19–32)
CREATININE: 0.75 mg/dL (ref 0.50–1.10)
Calcium: 8.8 mg/dL (ref 8.4–10.5)
Chloride: 105 mEq/L (ref 96–112)
GFR, Est Non African American: >89 mL/min
Glucose, Bld: 83 mg/dL (ref 70–99)
Potassium: 3.5 mEq/L (ref 3.5–5.3)
Sodium: 138 mEq/L (ref 135–145)
Total Bilirubin: 0.3 mg/dL (ref 0.2–1.2)
Total Protein: 6.8 g/dL (ref 6.0–8.3)

## 2013-07-03 LAB — POCT URINALYSIS DIPSTICK
Glucose, UA: NEGATIVE
Nitrite, UA: NEGATIVE
Protein, UA: 300
Spec Grav, UA: >=1.03
UROBILINOGEN UA: 1
pH, UA: 6

## 2013-07-03 LAB — LIPID PANEL
Cholesterol: 169 mg/dL (ref 0–200)
HDL: 51 mg/dL (ref >39)
LDL Cholesterol: 95 mg/dL (ref 0–99)
Total CHOL/HDL Ratio: 3.3 Ratio
Triglycerides: 114 mg/dL (ref <150)
VLDL: 23 mg/dL (ref 0–40)

## 2013-07-03 LAB — POCT GLYCOSYLATED HEMOGLOBIN (HGB A1C): HEMOGLOBIN A1C: 4.8

## 2013-07-03 MED ORDER — TRAMADOL HCL 50 MG PO TABS: 50.0000 mg | ORAL_TABLET | Freq: Three times a day (TID) | ORAL | Status: DC | PRN

## 2013-07-03 NOTE — Progress Notes (Signed)
Patient ID: Meagan Dean, female   DOB: Nov 30, 1970, 43 y.o.   MRN: [id number]   CC:  HPI: 43 year old female here for hospital followup. She status post Cystoscopy, right retrograde ureteropyelogram, right distal ureteroscopy, placement of double-J stent for right ureteral hydronephrosis on 06/16/13. Most recent urine culture showed Escherichia coli which was pansensitive. Patient is having another surgery on 2/23 for stent removal and stone removal with lithotripsy. During hospitalization she was found to be hypokalemic and had a hospitalist consultation. She still continues to have some right flank discomfort and is now taking her Percocet because of sedating side effects. She also has a personal history of endometriosis, multiple uterine fibroids. She had a mammogram when she was in her 42s. Last Pap smear was in June 2014 Social history she admits to never having smoke, drinks socially Family history mother died when the patient was very young, father has had a stroke and is wheelchair-bound    Allergies  Allergen Reactions  . Vicodin [Hydrocodone-Acetaminophen] Other (See Comments)    "very bad reaction"   Past Medical History  Diagnosis Date  . Right ureteral stone   . Renal calculus, left     NON-OBSTRUCTING  . History of hypertension     NO ISSUES SINCE WT LOSS AFTER GASTRIC BANDING  . Asthma due to seasonal allergies     MILD--  NO INHALER  . H/O hiatal hernia   . GERD (gastroesophageal reflux disease)   . Urgency of urination   . Endometriosis of pelvis   . History of obstructive sleep apnea     DX'D PRIOR TO GASTRIC BANDING  2010 --  NO ISSUES SINCE WT LOSS   Current Outpatient Prescriptions on File Prior to Visit  Medication Sig Dispense Refill  . acetaminophen (TYLENOL) 325 MG tablet Take 650 mg by mouth every 6 (six) hours as needed.      . potassium chloride (K-DUR) 10 MEQ tablet Take 1 tablet (10 mEq total) by mouth 2 (two) times daily.  60 tablet  1  .  pseudoephedrine (SUDAFED) 30 MG tablet Take 30 mg by mouth every 4 (four) hours as needed for congestion.       No current facility-administered medications on file prior to visit.   No family history on file. History   Social History  . Marital Status: Single    Spouse Name: N/A    Number of Children: N/A  . Years of Education: N/A   Occupational History  . Not on file.   Social History Main Topics  . Smoking status: Never Smoker   . Smokeless tobacco: Never Used  . Alcohol Use: Yes     Comment: occasionally  . Drug Use: No  . Sexual Activity: Not on file   Other Topics Concern  . Not on file   Social History Narrative  . No narrative on file    Review of Systems  Constitutional: Negative for fever, chills, diaphoresis, activity change, appetite change and fatigue.  HENT: Negative for ear pain, nosebleeds, congestion, facial swelling, rhinorrhea, neck pain, neck stiffness and ear discharge.   Eyes: Negative for pain, discharge, redness, itching and visual disturbance.  Respiratory: Negative for cough, choking, chest tightness, shortness of breath, wheezing and stridor.   Cardiovascular: Negative for chest pain, palpitations and leg swelling.  Gastrointestinal: Negative for abdominal distention.  Genitourinary: Negative for dysuria, urgency, frequency, hematuria, flank pain, decreased urine volume, difficulty urinating and dyspareunia.  Musculoskeletal: Negative for back pain, joint swelling, arthralgias and  gait problem.  Neurological: Negative for dizziness, tremors, seizures, syncope, facial asymmetry, speech difficulty, weakness, light-headedness, numbness and headaches.  Hematological: Negative for adenopathy. Does not bruise/bleed easily.  Psychiatric/Behavioral: Negative for hallucinations, behavioral problems, confusion, dysphoric mood, decreased concentration and agitation.    Objective:   Filed Vitals:   07/03/13 0928  BP: 112/74  Pulse: 80  Temp: 98.3 F  (36.8 C)    Physical Exam  Constitutional: Appears well-developed and well-nourished. No distress.  HENT: Normocephalic. External right and left ear normal. Oropharynx is clear and moist.  Eyes: Conjunctivae and EOM are normal. PERRLA, no scleral icterus.  Neck: Normal ROM. Neck supple. No JVD. No tracheal deviation. No thyromegaly.  CVS: RRR, S1/S2 +, no murmurs, no gallops, no carotid bruit.  Pulmonary: Effort and breath sounds normal, no stridor, rhonchi, wheezes, rales.  Abdominal: Soft. BS +,  no distension, tenderness, rebound or guarding.  Musculoskeletal: Normal range of motion. No edema and no tenderness.  Lymphadenopathy: No lymphadenopathy noted, cervical, inguinal. Neuro: Alert. Normal reflexes, muscle tone coordination. No cranial nerve deficit. Skin: Skin is warm and dry. No rash noted. Not diaphoretic. No erythema. No pallor.  Psychiatric: Normal mood and affect. Behavior, judgment, thought content normal.   Lab Results  Component Value Date   WBC 9.5 06/16/2013   HGB 11.6* 06/16/2013   HCT 34.0* 06/16/2013   MCV 82.0 06/16/2013   PLT 392 06/16/2013   Lab Results  Component Value Date   CREATININE 1.16* 06/18/2013   BUN 10 06/18/2013   NA 139 06/18/2013   K 3.6* 06/18/2013   CL 103 06/18/2013   CO2 28 06/18/2013    No results found for this basename: HGBA1C   Lipid Panel  No results found for this basename: chol, trig, hdl, cholhdl, vldl, ldlcalc       Assessment and plan:   Patient Active Problem List   Diagnosis Date Noted  . Hypokalemia 06/17/2013  . Calculus of ureter 06/16/2013   Right-sided ureteral hydronephrosis Status post stent Scheduled for surgery 2/23 Repeat urinalysis to ensure that the infection has been cleared Tramadol for pain control    Hypokalemia Repeat labs today   Establish care Patient if her Pap smear in June 2015 Scheduled the patient for mammogram She received Pneumovax 2/50 Refusing flu vaccination She will think about it tetanus  immunization  Followup in 3 months       The patient was given clear instructions to go to ER or return to medical center if symptoms don't improve, worsen or new problems develop. The patient verbalized understanding. The patient was told to call to get any lab results if not heard anything in the next week.

## 2013-07-04 LAB — TSH: TSH: 2.418 u[IU]/mL (ref 0.350–4.500)

## 2013-07-05 ENCOUNTER — Ambulatory Visit (HOSPITAL_BASED_OUTPATIENT_CLINIC_OR_DEPARTMENT_OTHER): Payer: Self-pay | Admitting: Anesthesiology

## 2013-07-05 ENCOUNTER — Encounter (HOSPITAL_BASED_OUTPATIENT_CLINIC_OR_DEPARTMENT_OTHER): Payer: MEDICAID | Admitting: Anesthesiology

## 2013-07-05 ENCOUNTER — Ambulatory Visit (HOSPITAL_BASED_OUTPATIENT_CLINIC_OR_DEPARTMENT_OTHER)
Admission: RE | Admit: 2013-07-05 | Discharge: 2013-07-05 | Disposition: A | Discharge status: Home / Self Care | Payer: Self-pay | Source: Ambulatory Visit | Attending: Urology | Admitting: Urology

## 2013-07-05 ENCOUNTER — Encounter (HOSPITAL_BASED_OUTPATIENT_CLINIC_OR_DEPARTMENT_OTHER): Payer: Self-pay | Admitting: *Deleted

## 2013-07-05 ENCOUNTER — Encounter (HOSPITAL_BASED_OUTPATIENT_CLINIC_OR_DEPARTMENT_OTHER)
Admission: RE | Disposition: A | Discharge status: Home / Self Care | Payer: Self-pay | Source: Ambulatory Visit | Attending: Urology

## 2013-07-05 DIAGNOSIS — I10 Essential (primary) hypertension: Secondary | ICD-10-CM | POA: Insufficient documentation

## 2013-07-05 DIAGNOSIS — K219 Gastro-esophageal reflux disease without esophagitis: Secondary | ICD-10-CM | POA: Insufficient documentation

## 2013-07-05 DIAGNOSIS — Z9884 Bariatric surgery status: Secondary | ICD-10-CM | POA: Insufficient documentation

## 2013-07-05 DIAGNOSIS — N200 Calculus of kidney: Secondary | ICD-10-CM | POA: Insufficient documentation

## 2013-07-05 DIAGNOSIS — E876 Hypokalemia: Secondary | ICD-10-CM | POA: Insufficient documentation

## 2013-07-05 DIAGNOSIS — N390 Urinary tract infection, site not specified: Secondary | ICD-10-CM | POA: Insufficient documentation

## 2013-07-05 DIAGNOSIS — Z79899 Other long term (current) drug therapy: Secondary | ICD-10-CM | POA: Insufficient documentation

## 2013-07-05 DIAGNOSIS — J45909 Unspecified asthma, uncomplicated: Secondary | ICD-10-CM | POA: Insufficient documentation

## 2013-07-05 DIAGNOSIS — N201 Calculus of ureter: Secondary | ICD-10-CM | POA: Insufficient documentation

## 2013-07-05 HISTORY — DX: Personal history of other diseases of the digestive system: Z87.19

## 2013-07-05 HISTORY — DX: Gastro-esophageal reflux disease without esophagitis: K21.9

## 2013-07-05 HISTORY — DX: Urgency of urination: R39.15

## 2013-07-05 HISTORY — DX: Personal history of other diseases of the circulatory system: Z86.79

## 2013-07-05 HISTORY — DX: Personal history of other diseases of the nervous system and sense organs: Z86.69

## 2013-07-05 HISTORY — PX: CYSTOSCOPY WITH RETROGRADE PYELOGRAM, URETEROSCOPY AND STENT PLACEMENT: SHX5789

## 2013-07-05 HISTORY — DX: Reserved for concepts with insufficient information to code with codable children: IMO0002

## 2013-07-05 HISTORY — DX: Calculus of ureter: N20.1

## 2013-07-05 HISTORY — DX: Unspecified asthma, uncomplicated: J45.909

## 2013-07-05 HISTORY — DX: Endometriosis of pelvic peritoneum: N80.3

## 2013-07-05 LAB — POCT I-STAT, CHEM 8
BUN: 12 mg/dL (ref 6–23)
CHLORIDE: 103 meq/L (ref 96–112)
CREATININE: 0.9 mg/dL (ref 0.50–1.10)
Calcium, Ion: 1.25 mmol/L — ABNORMAL HIGH (ref 1.12–1.23)
Glucose, Bld: 84 mg/dL (ref 70–99)
HCT: 35 % — ABNORMAL LOW (ref 36.0–46.0)
Hemoglobin: 11.9 g/dL — ABNORMAL LOW (ref 12.0–15.0)
POTASSIUM: 3.1 meq/L — AB (ref 3.7–5.3)
SODIUM: 143 meq/L (ref 137–147)
TCO2: 28 mmol/L (ref 0–100)

## 2013-07-05 LAB — POCT PREGNANCY, URINE: PREG TEST UR: NEGATIVE

## 2013-07-05 SURGERY — CYSTOURETEROSCOPY, WITH RETROGRADE PYELOGRAM AND STENT INSERTION
Anesthesia: General | Site: Ureter | Laterality: Right

## 2013-07-05 MED ORDER — FENTANYL CITRATE 0.05 MG/ML IJ SOLN
INTRAMUSCULAR | Status: AC
  Filled 2013-07-05: qty 6

## 2013-07-05 MED ORDER — MIDAZOLAM HCL 2 MG/2ML IJ SOLN
INTRAMUSCULAR | Status: AC
  Filled 2013-07-05: qty 2

## 2013-07-05 MED ORDER — MIDAZOLAM HCL 5 MG/5ML IJ SOLN
INTRAMUSCULAR | Status: DC | PRN
  Administered 2013-07-05: 2 mg via INTRAVENOUS

## 2013-07-05 MED ORDER — LACTATED RINGERS IV SOLN
INTRAVENOUS | Status: DC
  Administered 2013-07-05: 08:00:00 via INTRAVENOUS
  Filled 2013-07-05: qty 1000

## 2013-07-05 MED ORDER — TRAMADOL HCL 50 MG PO TABS
ORAL_TABLET | ORAL | Status: AC
  Filled 2013-07-05: qty 1

## 2013-07-05 MED ORDER — PROPOFOL 10 MG/ML IV BOLUS
INTRAVENOUS | Status: DC | PRN
  Administered 2013-07-05: 200 mg via INTRAVENOUS

## 2013-07-05 MED ORDER — PROMETHAZINE HCL 25 MG/ML IJ SOLN
6.2500 mg | INTRAMUSCULAR | Status: DC | PRN
  Filled 2013-07-05: qty 1

## 2013-07-05 MED ORDER — LACTATED RINGERS IV SOLN
INTRAVENOUS | Status: DC
  Administered 2013-07-05: 10:00:00 via INTRAVENOUS
  Filled 2013-07-05: qty 1000

## 2013-07-05 MED ORDER — HYDROMORPHONE HCL PF 1 MG/ML IJ SOLN
0.2500 mg | INTRAMUSCULAR | Status: DC | PRN
  Administered 2013-07-05 (×2): 0.5 mg via INTRAVENOUS
  Filled 2013-07-05: qty 1

## 2013-07-05 MED ORDER — LIDOCAINE HCL (CARDIAC) 20 MG/ML IV SOLN
INTRAVENOUS | Status: DC | PRN
  Administered 2013-07-05: 60 mg via INTRAVENOUS

## 2013-07-05 MED ORDER — SODIUM CHLORIDE 0.9 % IR SOLN
Status: DC | PRN
  Administered 2013-07-05: 2000 mL

## 2013-07-05 MED ORDER — DEXAMETHASONE SODIUM PHOSPHATE 4 MG/ML IJ SOLN
INTRAMUSCULAR | Status: DC | PRN
  Administered 2013-07-05: 10 mg via INTRAVENOUS

## 2013-07-05 MED ORDER — IOHEXOL 350 MG/ML SOLN
INTRAVENOUS | Status: DC | PRN
  Administered 2013-07-05: 1 mL

## 2013-07-05 MED ORDER — BELLADONNA ALKALOIDS-OPIUM 16.2-60 MG RE SUPP
RECTAL | Status: AC
  Filled 2013-07-05: qty 1

## 2013-07-05 MED ORDER — CEFAZOLIN SODIUM-DEXTROSE 2-3 GM-% IV SOLR
2.0000 g | INTRAVENOUS | Status: AC
  Administered 2013-07-05: 2 g via INTRAVENOUS
  Filled 2013-07-05: qty 50

## 2013-07-05 MED ORDER — EPHEDRINE SULFATE 50 MG/ML IJ SOLN
INTRAMUSCULAR | Status: DC | PRN
  Administered 2013-07-05: 10 mg via INTRAVENOUS
  Administered 2013-07-05: 5 mg via INTRAVENOUS

## 2013-07-05 MED ORDER — CEFAZOLIN SODIUM 1-5 GM-% IV SOLN
1.0000 g | INTRAVENOUS | Status: DC
  Filled 2013-07-05: qty 50

## 2013-07-05 MED ORDER — SULFAMETHOXAZOLE-TMP DS 800-160 MG PO TABS: 1.0000 | ORAL_TABLET | Freq: Two times a day (BID) | ORAL | Status: DC

## 2013-07-05 MED ORDER — TRAMADOL HCL 50 MG PO TABS
50.0000 mg | ORAL_TABLET | Freq: Three times a day (TID) | ORAL | Status: DC | PRN
Start: 2013-07-05 — End: 2013-07-05
  Administered 2013-07-05: 50 mg via ORAL
  Filled 2013-07-05: qty 1

## 2013-07-05 MED ORDER — FENTANYL CITRATE 0.05 MG/ML IJ SOLN
INTRAMUSCULAR | Status: DC | PRN
  Administered 2013-07-05: 50 ug via INTRAVENOUS

## 2013-07-05 MED ORDER — ONDANSETRON HCL 4 MG/2ML IJ SOLN
INTRAMUSCULAR | Status: DC | PRN
  Administered 2013-07-05: 4 mg via INTRAVENOUS

## 2013-07-05 MED ORDER — HYDROMORPHONE HCL PF 1 MG/ML IJ SOLN
INTRAMUSCULAR | Status: AC
  Filled 2013-07-05: qty 1

## 2013-07-05 SURGICAL SUPPLY — 26 items
ADAPTER CATH URET PLST 4-6FR (CATHETERS) IMPLANT
ADPR CATH URET STRL DISP 4-6FR (CATHETERS)
BAG DRAIN URO-CYSTO SKYTR STRL (DRAIN) ×4 IMPLANT
BAG DRN UROCATH (DRAIN) ×2
BASKET ZERO TIP NITINOL 2.4FR (BASKET) ×3 IMPLANT
BSKT STON RTRVL ZERO TP 2.4FR (BASKET) ×2
CANISTER SUCT LVC 12 LTR MEDI- (MISCELLANEOUS) ×3 IMPLANT
CATH INTERMIT  6FR 70CM (CATHETERS) IMPLANT
CLOTH BEACON ORANGE TIMEOUT ST (SAFETY) ×4 IMPLANT
DRAPE CAMERA CLOSED 9X96 (DRAPES) ×4 IMPLANT
GLOVE BIO SURGEON STRL SZ8 (GLOVE) ×4 IMPLANT
GLOVE BIOGEL M STER SZ 6 (GLOVE) ×3 IMPLANT
GLOVE BIOGEL PI IND STRL 6.5 (GLOVE) ×2 IMPLANT
GLOVE BIOGEL PI INDICATOR 6.5 (GLOVE) ×4
GOWN PREVENTION PLUS LG XLONG (DISPOSABLE) ×1 IMPLANT
GOWN STRL REIN XL XLG (GOWN DISPOSABLE) ×1 IMPLANT
GOWN STRL REUS W/TWL LRG LVL3 (GOWN DISPOSABLE) ×3 IMPLANT
GOWN STRL REUS W/TWL XL LVL3 (GOWN DISPOSABLE) ×3 IMPLANT
GUIDEWIRE 0.038 PTFE COATED (WIRE) IMPLANT
GUIDEWIRE ANG ZIPWIRE 038X150 (WIRE) IMPLANT
GUIDEWIRE STR DUAL SENSOR (WIRE) ×3 IMPLANT
IV NS 1000ML (IV SOLUTION) ×8
IV NS 1000ML BAXH (IV SOLUTION) ×2 IMPLANT
IV NS IRRIG 3000ML ARTHROMATIC (IV SOLUTION) ×1 IMPLANT
NS IRRIG 500ML POUR BTL (IV SOLUTION) IMPLANT
PACK CYSTOSCOPY (CUSTOM PROCEDURE TRAY) ×4 IMPLANT

## 2013-07-05 NOTE — Anesthesia Preprocedure Evaluation (Addendum)
Anesthesia Evaluation  Patient identified by MRN, date of birth, ID band Patient awake    Reviewed: Allergy & Precautions, H&P , NPO status , Patient's Chart, lab work & pertinent test results  Airway Mallampati: II TM Distance: >3 FB Neck ROM: full    Dental no notable dental hx. (+) Teeth Intact, Dental Advisory Given,    Pulmonary asthma , former smoker,  breath sounds clear to auscultation  Pulmonary exam normal       Cardiovascular Exercise Tolerance: Good hypertension, Rhythm:regular Rate:Normal     Neuro/Psych negative neurological ROS  negative psych ROS   GI/Hepatic negative GI ROS, Neg liver ROS, hiatal hernia, GERD-  ,Gastric Banding 2010   Endo/Other  negative endocrine ROS  Renal/GU Renal diseasenegative Renal ROS  negative genitourinary   Musculoskeletal negative musculoskeletal ROS (+)   Abdominal   Peds negative pediatric ROS (+)  Hematology negative hematology ROS (+)   Anesthesia Other Findings Hypokalemia dx one week ago; on oral supplements  Reproductive/Obstetrics negative OB ROS                          Anesthesia Physical Anesthesia Plan  ASA: II  Anesthesia Plan: General   Post-op Pain Management:    Induction: Intravenous  Airway Management Planned: LMA  Additional Equipment:   Intra-op Plan:   Post-operative Plan: Extubation in OR  Informed Consent: I have reviewed the patients History and Physical, chart, labs and discussed the procedure including the risks, benefits and alternatives for the proposed anesthesia with the patient or authorized representative who has indicated his/her understanding and acceptance.   Dental advisory given  Plan Discussed with: CRNA  Anesthesia Plan Comments:         Anesthesia Quick Evaluation

## 2013-07-05 NOTE — H&P (Signed)
  H&P  Chief Complaint: Kidney stone  History of Present Illness: Meagan Dean is a 43 y.o. year old female who presents for ureteroscopic removal of a right sided ureteral stone. She underwent urgent stenting on 06/18/2013 for her obstructing stone associated with pyuria and hypokalemia. Her UTI was treated appropriately, and she presents at this time for ureteroscopy.  Past Medical History  Diagnosis Date  . Right ureteral stone   . Renal calculus, left     NON-OBSTRUCTING  . History of hypertension     NO ISSUES SINCE WT LOSS AFTER GASTRIC BANDING  . Asthma due to seasonal allergies     MILD--  NO INHALER  . H/O hiatal hernia   . GERD (gastroesophageal reflux disease)   . Urgency of urination   . Endometriosis of pelvis   . History of obstructive sleep apnea     DX'D PRIOR TO GASTRIC BANDING  2010 --  NO ISSUES SINCE WT LOSS    Past Surgical History  Procedure Laterality Date  . Laparoscopic gastric banding  06-06-2008  . Cystoscopy with retrograde pyelogram, ureteroscopy and stent placement Right 06/18/2013    Procedure: [place], URETEROSCOPY AND STENT PLACEMENT;  Surgeon: Franchot Gallo, MD;  Location: [facility];  Service: Urology;  Laterality: Right;  . Laparoscopy /  ovarian cystectomy/  laser ablation endometriosis  2003  . Exploratory lapartomy/  myomectomy  2005    Home Medications:  Medications Prior to Admission  Medication Sig Dispense Refill  . acetaminophen (TYLENOL) 325 MG tablet Take 650 mg by mouth every 6 (six) hours as needed.      . potassium chloride (K-DUR) 10 MEQ tablet Take 1 tablet (10 mEq total) by mouth 2 (two) times daily.  60 tablet  1  . pseudoephedrine (SUDAFED) 30 MG tablet Take 30 mg by mouth every 4 (four) hours as needed for congestion.      . traMADol (ULTRAM) 50 MG tablet Take 1 tablet (50 mg total) by mouth every 8 (eight) hours as needed for moderate pain (Flank pain).  30 tablet  0    Allergies:  Allergies   Allergen Reactions  . Vicodin [Hydrocodone-Acetaminophen] Other (See Comments)    "very bad reaction"    History reviewed. No pertinent family history.  Social History:  reports that she has never smoked. She has never used smokeless tobacco. She reports that she drinks alcohol. She reports that she does not use illicit drugs.  ROS: A complete review of systems was performed.  All systems are negative except for pertinent findings as noted. She is bothered by stent discomfort  Physical Exam:  Vital signs in last 24 hours:   General:  Alert and oriented, No acute distress HEENT: Normocephalic, atraumatic Neck: No JVD or lymphadenopathy Cardiovascular: Regular rate and rhythm Lungs: Clear bilaterally Abdomen: Soft, nontender, nondistended, no abdominal masses Back: No CVA tenderness Extremities: No edema Neurologic: Grossly intact  Laboratory Data:  No results found for this or any previous visit (from the past 24 hour(s)). No results found for this or any previous visit (from the past 240 hour(s)). Creatinine:  Recent Labs  07/03/13 1003  CREATININE 0.75    Radiologic Imaging: No results found.  Impression/Assessment:  Right ureteral stone, s/p stenting and abx management  Plan:  Cystoscopy, stent removal, ureteroscopy, stone removal  Meagan Dean 07/05/2013, 7:01 AM  Meagan Dean. Sotero Brinkmeyer MD

## 2013-07-05 NOTE — Progress Notes (Signed)
Dr. Winfred Leeds aware pt has no one to stay w her overnight.  Pt informed by Dr. Winfred Leeds that she will need to sign AMA form prior to leaving.

## 2013-07-05 NOTE — Transfer of Care (Signed)
Immediate Anesthesia Transfer of Care Note  Patient: Meagan Dean  Procedure(s) Performed: Procedure(s) (LRB): CYSTOSCOPY , URETEROSCOPY ANDstone extraction (Right)  Patient Location: PACU  Anesthesia Type: General  Level of Consciousness: awake, oriented, sedated and patient cooperative  Airway & Oxygen Therapy: Patient Spontanous Breathing and Patient connected to face mask oxygen  Post-op Assessment: Report given to PACU RN and Post -op Vital signs reviewed and stable  Post vital signs: Reviewed and stable  Complications: No apparent anesthesia complications

## 2013-07-05 NOTE — Anesthesia Procedure Notes (Signed)
Procedure Name: LMA Insertion Date/Time: 07/05/2013 8:29 AM Performed by: Denna Haggard D Pre-anesthesia Checklist: Patient identified, Emergency Drugs available, Suction available and Patient being monitored Patient Re-evaluated:Patient Re-evaluated prior to inductionOxygen Delivery Method: Circle System Utilized Preoxygenation: Pre-oxygenation with 100% oxygen Intubation Type: IV induction Ventilation: Mask ventilation without difficulty LMA: LMA inserted LMA Size: 4.0 Number of attempts: 1 Airway Equipment and Method: bite block Placement Confirmation: positive ETCO2 Tube secured with: Tape Dental Injury: Teeth and Oropharynx as per pre-operative assessment

## 2013-07-05 NOTE — Anesthesia Postprocedure Evaluation (Signed)
Anesthesia Post Note  Patient: Meagan Dean  Procedure(s) Performed: Procedure(s) (LRB): CYSTOSCOPY , URETEROSCOPY ANDstone extraction (Right)  Anesthesia type: General  Patient location: PACU  Post pain: Pain level controlled  Post assessment: Post-op Vital signs reviewed  Last Vitals:  Filed Vitals:   07/05/13 1026  BP: 128/78  Pulse: 60  Temp: 36.2 C  Resp: 16    Post vital signs: Reviewed  Level of consciousness: sedated  Complications: No apparent anesthesia complications

## 2013-07-05 NOTE — Discharge Instructions (Signed)
1. You may see some blood in the urine and may have some burning with urination for 48-72 hours. You also may notice that you have to urinate more frequently or urgently after your procedure which is normal.  2. You should call should you develop an inability urinate, fever > 101, persistent nausea and vomiting that prevents you from eating or drinking to stay hydrated.  3. If you have a stent, you will likely urinate more frequently and urgently until the stent is removed and you may experience some discomfort/pain in the lower abdomen and flank especially when urinating. You may take pain medication prescribed to you if needed for pain. You may also intermittently have blood in the urine until the stent is removed. 4. If you have a catheter, you will be taught how to take care of the catheter by the nursing staff prior to discharge from the hospital.  You may periodically feel a strong urge to void with the catheter in place.  This is a bladder spasm and most often can occur when having a bowel movement or moving around. It is typically self-limited and usually will stop after a few minutes.  You may use some Vaseline or Neosporin around the tip of the catheter to reduce friction at the tip of the penis. You may also see some blood in the urine.  A very small amount of blood can make the urine look quite red.  As long as the catheter is draining well, there usually is not a problem.  However, if the catheter is not draining well and is bloody, you should call the office [phone]) to notify us.   Post Anesthesia Home Care Instructions  Activity: Get plenty of rest for the remainder of the day. A responsible adult should stay with you for 24 hours following the procedure.  For the next 24 hours, DO NOT: -Drive a car -Paediatric nurse -Drink alcoholic beverages -Take any medication unless instructed by your physician -Make any legal decisions or sign important papers.  Meals: Start with liquid  foods such as gelatin or soup. Progress to regular foods as tolerated. Avoid greasy, spicy, heavy foods. If nausea and/or vomiting occur, drink only clear liquids until the nausea and/or vomiting subsides. Call your physician if vomiting continues.  Special Instructions/Symptoms: Your throat may feel dry or sore from the anesthesia or the breathing tube placed in your throat during surgery. If this causes discomfort, gargle with warm salt water. The discomfort should disappear within 24 hours. CYSTOSCOPY HOME CARE INSTRUCTIONS  Activity: Rest for the remainder of the day.  Do not drive or operate equipment today.  You may resume normal activities in one to two days as instructed by your physician.   Meals: Drink plenty of liquids and eat light foods such as gelatin or soup this evening.  You may return to a normal meal plan tomorrow.  Return to Work: You may return to work in one to two days or as instructed by your physician.  Special Instructions / Symptoms: Call your physician if any of these symptoms occur:   -persistent or heavy bleeding  -bleeding which continues after first few urination  -large blood clots that are difficult to pass  -urine stream diminishes or stops completely  -fever equal to or higher than 101 degrees Farenheit.  -cloudy urine with a strong, foul odor  -severe pain  Females should always wipe from front to back after elimination.  You may feel some burning pain when you urinate.  This should disappear with time.  Applying moist heat to the lower abdomen or a hot tub bath may help relieve the pain. \  Follow-Up / Date of Return Visit to Your Physician:  *** Call for an appointment to arrange follow-up.  Patient Signature:  ________________________________________________________  Nurse's Signature:  ________________________________________________________

## 2013-07-05 NOTE — Op Note (Signed)
PATIENT:  Meagan Dean  PRE-OPERATIVE DIAGNOSIS: Right ureteral calculus  POST-OPERATIVE DIAGNOSIS: Same  PROCEDURE: Cystoscopy, extraction of right double-J stent, right ureteroscopy with stone extraction  SURGEON:  [name]. Bianney Rockwood, M.D.  ANESTHESIA:  General  EBL:  Minimal  DRAINS: None  LOCAL MEDICATIONS USED:  None  SPECIMEN:  Stone fragments  INDICATION: Meagan Dean is a 43 year old female who presented about 2 weeks ago with a symptomatic right ureteral stone, leukocytosis and evidence of urinary tract infection. She had a stent placed urgently secondary to her UTI, nausea, vomiting and hypokalemia. She has recovered from those symptoms, and presents at this time for definitive management of a right ureteral stone. The procedure of ureteroscopy, as well as risks and complications have been discussed with the patient. She understands these and desires to proceed.  Description of procedure: The patient was properly identified and marked  in the holding area. They were then  taken to the operating room and placed on the table in a supine position. General anesthesia was then administered. Once fully anesthetized the patient was moved to the dorsolithotomy position and the genitalia and perineum were sterilely prepped and draped in standard fashion. An official timeout was then performed.   I then advanced a 39 French panendoscope in her bladder which was circumferentially inspected and found to be normal except for some erythema around the right ureteral orifice and the previously placed double-J stent. The stent was grasped and extracted partially, with the distal end through the urethral meatus. I then passed a sensor-tip guidewire under fluoroscopic guidance into the right renal pelvis. The stent was extracted over top of the guidewire.  I then passed a 6 French rigid ureteroscope under direct vision into her right ureter. Approximately 5 cm up the ureter, the stone fragments  were identified, and then grasped separately/individually with the Nitinol basket. They were extracted. The entire ureter was inspected. No further stones were seen. The stones appeared to be probable uric acid, as there were tan and quite friable.  At this time, as the ureter was quite dilated, I felt it safe to proceed without leaving a stent in. The bladder was drained after the ureteroscope and the guidewire removed. The patient was awakened and taken to the PACU in stable condition.   PLAN OF CARE: Discharge to home after PACU  PATIENT DISPOSITION:  PACU - hemodynamically stable.

## 2013-07-06 ENCOUNTER — Encounter (HOSPITAL_BASED_OUTPATIENT_CLINIC_OR_DEPARTMENT_OTHER): Payer: Self-pay | Admitting: Urology

## 2013-07-06 LAB — POCT I-STAT, CHEM 8
BUN: 13 mg/dL (ref 6–23)
CHLORIDE: 106 meq/L (ref 96–112)
CREATININE: 0.7 mg/dL (ref 0.50–1.10)
Calcium, Ion: 1.25 mmol/L — ABNORMAL HIGH (ref 1.12–1.23)
Glucose, Bld: 82 mg/dL (ref 70–99)
HCT: 18 % — ABNORMAL LOW (ref 36.0–46.0)
Hemoglobin: 6.1 g/dL — CL (ref 12.0–15.0)
Potassium: 3.8 mEq/L (ref 3.7–5.3)
SODIUM: 143 meq/L (ref 137–147)
TCO2: 25 mmol/L (ref 0–100)

## 2013-07-12 ENCOUNTER — Telehealth: Payer: Self-pay | Admitting: *Deleted

## 2013-07-12 NOTE — Telephone Encounter (Signed)
Left patient a voicemail to return our call. 

## 2013-07-12 NOTE — Telephone Encounter (Signed)
Message copied by Kyjuan Gause, [place] R on Mon Jul 12, 2013  1:48 PM ------      Message from: Allyson Sabal MD, Ascencion Dike      Created: Wed Jul 07, 2013  1:32 PM       Notify patient of the labs are normal ------

## 2013-07-21 ENCOUNTER — Other Ambulatory Visit: Payer: Self-pay | Admitting: Internal Medicine

## 2013-07-21 DIAGNOSIS — Z1231 Encounter for screening mammogram for malignant neoplasm of breast: Secondary | ICD-10-CM

## 2013-07-22 ENCOUNTER — Telehealth: Payer: Self-pay | Admitting: Internal Medicine

## 2013-07-22 NOTE — Telephone Encounter (Signed)
Pt returning call regarding lab results 

## 2013-07-22 NOTE — Telephone Encounter (Signed)
Pt given lab results 

## 2014-07-28 ENCOUNTER — Emergency Department (HOSPITAL_COMMUNITY): Payer: Self-pay | Admitting: Certified Registered Nurse Anesthetist

## 2014-07-28 ENCOUNTER — Emergency Department (HOSPITAL_COMMUNITY): Payer: Self-pay

## 2014-07-28 ENCOUNTER — Encounter (HOSPITAL_COMMUNITY): Admission: EM | Disposition: A | Discharge status: Home / Self Care | Payer: Self-pay | Source: Home / Self Care

## 2014-07-28 ENCOUNTER — Inpatient Hospital Stay (HOSPITAL_COMMUNITY)
Admission: EM | Admit: 2014-07-28 | Discharge: 2014-08-04 | DRG: 339 | Disposition: A | Discharge status: Home / Self Care | Payer: Self-pay | Attending: General Surgery | Admitting: General Surgery

## 2014-07-28 ENCOUNTER — Emergency Department (HOSPITAL_COMMUNITY): Payer: MEDICAID | Admitting: Certified Registered Nurse Anesthetist

## 2014-07-28 ENCOUNTER — Encounter (HOSPITAL_COMMUNITY): Payer: Self-pay | Admitting: Emergency Medicine

## 2014-07-28 DIAGNOSIS — Z9884 Bariatric surgery status: Secondary | ICD-10-CM

## 2014-07-28 DIAGNOSIS — K567 Ileus, unspecified: Secondary | ICD-10-CM | POA: Diagnosis not present

## 2014-07-28 DIAGNOSIS — N390 Urinary tract infection, site not specified: Secondary | ICD-10-CM | POA: Diagnosis present

## 2014-07-28 DIAGNOSIS — K219 Gastro-esophageal reflux disease without esophagitis: Secondary | ICD-10-CM | POA: Diagnosis present

## 2014-07-28 DIAGNOSIS — K3532 Acute appendicitis with perforation and localized peritonitis, without abscess: Secondary | ICD-10-CM | POA: Diagnosis present

## 2014-07-28 DIAGNOSIS — K449 Diaphragmatic hernia without obstruction or gangrene: Secondary | ICD-10-CM | POA: Diagnosis present

## 2014-07-28 DIAGNOSIS — E876 Hypokalemia: Secondary | ICD-10-CM | POA: Diagnosis not present

## 2014-07-28 DIAGNOSIS — R1031 Right lower quadrant pain: Secondary | ICD-10-CM

## 2014-07-28 DIAGNOSIS — K352 Acute appendicitis with generalized peritonitis: Principal | ICD-10-CM | POA: Diagnosis present

## 2014-07-28 DIAGNOSIS — K353 Acute appendicitis with localized peritonitis, without perforation or gangrene: Secondary | ICD-10-CM

## 2014-07-28 HISTORY — PX: LAPAROSCOPIC APPENDECTOMY: SHX408

## 2014-07-28 LAB — URINALYSIS, ROUTINE W REFLEX MICROSCOPIC
GLUCOSE, UA: NEGATIVE mg/dL
Ketones, ur: NEGATIVE mg/dL
Nitrite: POSITIVE — AB
PH: 6 (ref 5.0–8.0)
Protein, ur: 30 mg/dL — AB
Specific Gravity, Urine: 1.03 (ref 1.005–1.030)
Urobilinogen, UA: 0.2 mg/dL (ref 0.0–1.0)

## 2014-07-28 LAB — CBC WITH DIFFERENTIAL/PLATELET
BASOS PCT: 1 % (ref 0–1)
Basophils Absolute: 0.1 10*3/uL (ref 0.0–0.1)
EOS ABS: 0.2 10*3/uL (ref 0.0–0.7)
Eosinophils Relative: 2 % (ref 0–5)
HEMATOCRIT: 34 % — AB (ref 36.0–46.0)
HEMOGLOBIN: 11 g/dL — AB (ref 12.0–15.0)
LYMPHS ABS: 2 10*3/uL (ref 0.7–4.0)
Lymphocytes Relative: 25 % (ref 12–46)
MCH: 27.7 pg (ref 26.0–34.0)
MCHC: 32.4 g/dL (ref 30.0–36.0)
MCV: 85.6 fL (ref 78.0–100.0)
Monocytes Absolute: 0.3 10*3/uL (ref 0.1–1.0)
Monocytes Relative: 4 % (ref 3–12)
Neutro Abs: 5.2 10*3/uL (ref 1.7–7.7)
Neutrophils Relative %: 68 % (ref 43–77)
Platelets: 386 10*3/uL (ref 150–400)
RBC: 3.97 MIL/uL (ref 3.87–5.11)
RDW: 13.1 % (ref 11.5–15.5)
WBC: 7.8 10*3/uL (ref 4.0–10.5)

## 2014-07-28 LAB — COMPREHENSIVE METABOLIC PANEL
ALK PHOS: 49 U/L (ref 39–117)
ALT: 8 U/L (ref 0–35)
ANION GAP: 10 (ref 5–15)
AST: 12 U/L (ref 0–37)
Albumin: 3.6 g/dL (ref 3.5–5.2)
BILIRUBIN TOTAL: 0.7 mg/dL (ref 0.3–1.2)
BUN: 10 mg/dL (ref 6–23)
CO2: 27 mmol/L (ref 19–32)
CREATININE: 0.75 mg/dL (ref 0.50–1.10)
Calcium: 9.1 mg/dL (ref 8.4–10.5)
Chloride: 105 mmol/L (ref 96–112)
GFR calc Af Amer: >90 mL/min (ref >90)
GFR calc non Af Amer: >90 mL/min (ref >90)
Glucose, Bld: 86 mg/dL (ref 70–99)
Potassium: 3.4 mmol/L — ABNORMAL LOW (ref 3.5–5.1)
SODIUM: 142 mmol/L (ref 135–145)
TOTAL PROTEIN: 7.2 g/dL (ref 6.0–8.3)

## 2014-07-28 LAB — LIPASE, BLOOD: LIPASE: 25 U/L (ref 11–59)

## 2014-07-28 LAB — URINE MICROSCOPIC-ADD ON

## 2014-07-28 LAB — WET PREP, GENITAL
Clue Cells Wet Prep HPF POC: NONE SEEN
Trich, Wet Prep: NONE SEEN
Yeast Wet Prep HPF POC: NONE SEEN

## 2014-07-28 LAB — POC URINE PREG, ED: Preg Test, Ur: NEGATIVE

## 2014-07-28 SURGERY — APPENDECTOMY, LAPAROSCOPIC
Anesthesia: General | Site: Abdomen

## 2014-07-28 MED ORDER — MORPHINE SULFATE 2 MG/ML IJ SOLN
2.0000 mg | INTRAMUSCULAR | Status: DC | PRN
  Administered 2014-07-28 – 2014-07-30 (×5): 2 mg via INTRAVENOUS
  Filled 2014-07-28 (×4): qty 1

## 2014-07-28 MED ORDER — HYDROMORPHONE HCL 1 MG/ML IJ SOLN
1.0000 mg | Freq: Once | INTRAMUSCULAR | Status: AC
  Administered 2014-07-28: 1 mg via INTRAVENOUS
  Filled 2014-07-28: qty 1

## 2014-07-28 MED ORDER — DEXTROSE 5 % IV SOLN
INTRAVENOUS | Status: AC
  Filled 2014-07-28: qty 2

## 2014-07-28 MED ORDER — BUPIVACAINE HCL (PF) 0.5 % IJ SOLN
INTRAMUSCULAR | Status: AC
  Filled 2014-07-28: qty 30

## 2014-07-28 MED ORDER — MORPHINE SULFATE 2 MG/ML IJ SOLN
INTRAMUSCULAR | Status: AC
  Filled 2014-07-28: qty 1

## 2014-07-28 MED ORDER — PIPERACILLIN-TAZOBACTAM 3.375 G IVPB
3.3750 g | Freq: Three times a day (TID) | INTRAVENOUS | Status: DC
  Administered 2014-07-29 – 2014-08-04 (×20): 3.375 g via INTRAVENOUS
  Filled 2014-07-28 (×21): qty 50

## 2014-07-28 MED ORDER — DEXTROSE 5 % IV SOLN
1.0000 g | Freq: Once | INTRAVENOUS | Status: AC
  Administered 2014-07-28 (×2): 1 g via INTRAVENOUS
  Filled 2014-07-28: qty 10

## 2014-07-28 MED ORDER — MIDAZOLAM HCL 2 MG/2ML IJ SOLN
INTRAMUSCULAR | Status: AC
  Filled 2014-07-28: qty 2

## 2014-07-28 MED ORDER — LACTATED RINGERS IR SOLN
Status: DC | PRN
  Administered 2014-07-28: 4000 mL

## 2014-07-28 MED ORDER — FENTANYL CITRATE 0.05 MG/ML IJ SOLN
INTRAMUSCULAR | Status: AC
  Filled 2014-07-28: qty 2

## 2014-07-28 MED ORDER — CISATRACURIUM BESYLATE (PF) 10 MG/5ML IV SOLN
INTRAVENOUS | Status: DC | PRN
  Administered 2014-07-28: 6 mg via INTRAVENOUS
  Administered 2014-07-28: 2 mg via INTRAVENOUS

## 2014-07-28 MED ORDER — FENTANYL CITRATE 0.05 MG/ML IJ SOLN
INTRAMUSCULAR | Status: DC | PRN
  Administered 2014-07-28 (×5): 50 ug via INTRAVENOUS

## 2014-07-28 MED ORDER — ONDANSETRON HCL 4 MG/2ML IJ SOLN
4.0000 mg | INTRAMUSCULAR | Status: DC | PRN
  Administered 2014-07-30 – 2014-08-02 (×4): 4 mg via INTRAVENOUS
  Filled 2014-07-28 (×5): qty 2

## 2014-07-28 MED ORDER — LIDOCAINE HCL (CARDIAC) 20 MG/ML IV SOLN
INTRAVENOUS | Status: AC
  Filled 2014-07-28: qty 5

## 2014-07-28 MED ORDER — SUCCINYLCHOLINE CHLORIDE 20 MG/ML IJ SOLN
INTRAMUSCULAR | Status: DC | PRN
  Administered 2014-07-28: 100 mg via INTRAVENOUS

## 2014-07-28 MED ORDER — SODIUM CHLORIDE 0.9 % IV BOLUS (SEPSIS)
1000.0000 mL | Freq: Once | INTRAVENOUS | Status: AC
  Administered 2014-07-28: 1000 mL via INTRAVENOUS

## 2014-07-28 MED ORDER — TRAMADOL HCL 50 MG PO TABS
50.0000 mg | ORAL_TABLET | Freq: Four times a day (QID) | ORAL | Status: DC | PRN
  Administered 2014-07-29 – 2014-08-02 (×9): 100 mg via ORAL
  Filled 2014-07-28 (×9): qty 2

## 2014-07-28 MED ORDER — HEPARIN SODIUM (PORCINE) 5000 UNIT/ML IJ SOLN
5000.0000 [IU] | Freq: Three times a day (TID) | INTRAMUSCULAR | Status: DC
  Administered 2014-07-29 – 2014-08-04 (×19): 5000 [IU] via SUBCUTANEOUS
  Filled 2014-07-28 (×22): qty 1

## 2014-07-28 MED ORDER — ONDANSETRON HCL 4 MG/2ML IJ SOLN
INTRAMUSCULAR | Status: DC | PRN
  Administered 2014-07-28 (×2): 2 mg via INTRAVENOUS

## 2014-07-28 MED ORDER — PROPOFOL 10 MG/ML IV BOLUS
INTRAVENOUS | Status: DC | PRN
  Administered 2014-07-28: 150 mg via INTRAVENOUS

## 2014-07-28 MED ORDER — GLYCOPYRROLATE 0.2 MG/ML IJ SOLN
INTRAMUSCULAR | Status: DC | PRN
  Administered 2014-07-28: .2 mg via INTRAVENOUS
  Administered 2014-07-28: 0.4 mg via INTRAVENOUS

## 2014-07-28 MED ORDER — FENTANYL CITRATE 0.05 MG/ML IJ SOLN
INTRAMUSCULAR | Status: AC
  Filled 2014-07-28: qty 5

## 2014-07-28 MED ORDER — NEOSTIGMINE METHYLSULFATE 10 MG/10ML IV SOLN
INTRAVENOUS | Status: AC
  Filled 2014-07-28: qty 1

## 2014-07-28 MED ORDER — ONDANSETRON HCL 4 MG PO TABS: 4.0000 mg | ORAL_TABLET | Freq: Four times a day (QID) | ORAL | Status: DC | PRN

## 2014-07-28 MED ORDER — DEXAMETHASONE SODIUM PHOSPHATE 10 MG/ML IJ SOLN
INTRAMUSCULAR | Status: AC
  Filled 2014-07-28: qty 1

## 2014-07-28 MED ORDER — ONDANSETRON HCL 4 MG/2ML IJ SOLN
INTRAMUSCULAR | Status: AC
  Filled 2014-07-28: qty 2

## 2014-07-28 MED ORDER — LACTATED RINGERS IV SOLN
INTRAVENOUS | Status: DC | PRN
  Administered 2014-07-28 (×2): via INTRAVENOUS

## 2014-07-28 MED ORDER — EPHEDRINE SULFATE 50 MG/ML IJ SOLN
INTRAMUSCULAR | Status: DC | PRN
  Administered 2014-07-28 (×2): 10 mg via INTRAVENOUS

## 2014-07-28 MED ORDER — PROPOFOL 10 MG/ML IV BOLUS
INTRAVENOUS | Status: AC
  Filled 2014-07-28: qty 20

## 2014-07-28 MED ORDER — CISATRACURIUM BESYLATE 20 MG/10ML IV SOLN
INTRAVENOUS | Status: AC
  Filled 2014-07-28: qty 10

## 2014-07-28 MED ORDER — BUPIVACAINE HCL (PF) 0.5 % IJ SOLN
INTRAMUSCULAR | Status: DC | PRN
  Administered 2014-07-28: 15 mL

## 2014-07-28 MED ORDER — NEOSTIGMINE METHYLSULFATE 10 MG/10ML IV SOLN
INTRAVENOUS | Status: DC | PRN
  Administered 2014-07-28: 3 mg via INTRAVENOUS

## 2014-07-28 MED ORDER — KCL-LACTATED RINGERS-D5W 20 MEQ/L IV SOLN
INTRAVENOUS | Status: DC
  Administered 2014-08-01: 20:00:00 via INTRAVENOUS
  Filled 2014-07-28 (×17): qty 1000

## 2014-07-28 MED ORDER — 0.9 % SODIUM CHLORIDE (POUR BTL) OPTIME
TOPICAL | Status: DC | PRN
  Administered 2014-07-28: 1000 mL

## 2014-07-28 MED ORDER — LIDOCAINE HCL (CARDIAC) 20 MG/ML IV SOLN
INTRAVENOUS | Status: DC | PRN
  Administered 2014-07-28: 75 mg via INTRAVENOUS

## 2014-07-28 MED ORDER — DEXAMETHASONE SODIUM PHOSPHATE 10 MG/ML IJ SOLN
INTRAMUSCULAR | Status: DC | PRN
  Administered 2014-07-28: 10 mg via INTRAVENOUS

## 2014-07-28 MED ORDER — MIDAZOLAM HCL 5 MG/5ML IJ SOLN
INTRAMUSCULAR | Status: DC | PRN
  Administered 2014-07-28 (×2): 1 mg via INTRAVENOUS

## 2014-07-28 MED ORDER — IOHEXOL 300 MG/ML  SOLN
100.0000 mL | Freq: Once | INTRAMUSCULAR | Status: AC | PRN
  Administered 2014-07-28: 100 mL via INTRAVENOUS

## 2014-07-28 MED ORDER — ACETAMINOPHEN 10 MG/ML IV SOLN
1000.0000 mg | Freq: Once | INTRAVENOUS | Status: AC
  Administered 2014-07-28: 1000 mg via INTRAVENOUS
  Filled 2014-07-28: qty 100

## 2014-07-28 MED ORDER — GLYCOPYRROLATE 0.2 MG/ML IJ SOLN
INTRAMUSCULAR | Status: AC
  Filled 2014-07-28: qty 3

## 2014-07-28 SURGICAL SUPPLY — 60 items
APL SKNCLS STERI-STRIP NONHPOA (GAUZE/BANDAGES/DRESSINGS) ×1
APPLIER CLIP 5 13 M/L LIGAMAX5 (MISCELLANEOUS)
APPLIER CLIP ROT 10 11.4 M/L (STAPLE)
APR CLP MED LRG 11.4X10 (STAPLE)
APR CLP MED LRG 5 ANG JAW (MISCELLANEOUS)
BAG SPEC RTRVL LRG 6X4 10 (ENDOMECHANICALS) ×1
BENZOIN TINCTURE PRP APPL 2/3 (GAUZE/BANDAGES/DRESSINGS) ×3 IMPLANT
CHLORAPREP W/TINT 26ML (MISCELLANEOUS) ×3 IMPLANT
CLIP APPLIE 5 13 M/L LIGAMAX5 (MISCELLANEOUS) IMPLANT
CLIP APPLIE ROT 10 11.4 M/L (STAPLE) IMPLANT
CLOSURE WOUND 1/2 X4 (GAUZE/BANDAGES/DRESSINGS) ×1
CUTTER FLEX LINEAR 45M (STAPLE) ×2 IMPLANT
DECANTER SPIKE VIAL GLASS SM (MISCELLANEOUS) ×1 IMPLANT
DRAIN CHANNEL 19F RND (DRAIN) ×2 IMPLANT
DRAPE LAPAROSCOPIC ABDOMINAL (DRAPES) ×3 IMPLANT
DRSG TEGADERM 2-3/8X2-3/4 SM (GAUZE/BANDAGES/DRESSINGS) ×6 IMPLANT
DRSG TEGADERM 4X4.75 (GAUZE/BANDAGES/DRESSINGS) ×2 IMPLANT
ELECT REM PT RETURN 9FT ADLT (ELECTROSURGICAL) ×3
ELECTRODE REM PT RTRN 9FT ADLT (ELECTROSURGICAL) ×1 IMPLANT
ENDOLOOP SUT PDS II  0 18 (SUTURE)
ENDOLOOP SUT PDS II 0 18 (SUTURE) IMPLANT
EVACUATOR DRAINAGE 10X20 100CC (DRAIN) IMPLANT
EVACUATOR SILICONE 100CC (DRAIN) ×3 IMPLANT
GAUZE SPONGE 2X2 8PLY STRL LF (GAUZE/BANDAGES/DRESSINGS) ×1 IMPLANT
GLOVE BIO SURGEON STRL SZ7.5 (GLOVE) ×2 IMPLANT
GLOVE BIOGEL PI IND STRL 6.5 (GLOVE) IMPLANT
GLOVE BIOGEL PI IND STRL 7.5 (GLOVE) IMPLANT
GLOVE BIOGEL PI INDICATOR 6.5 (GLOVE) ×2
GLOVE BIOGEL PI INDICATOR 7.5 (GLOVE) ×2
GLOVE ECLIPSE 8.0 STRL XLNG CF (GLOVE) ×3 IMPLANT
GLOVE INDICATOR 8.0 STRL GRN (GLOVE) ×3 IMPLANT
GLOVE SURG SS PI 6.5 STRL IVOR (GLOVE) ×2 IMPLANT
GOWN STRL REUS W/TWL XL LVL3 (GOWN DISPOSABLE) ×6 IMPLANT
IV LACTATED RINGER IRRG 3000ML (IV SOLUTION) ×3
IV LACTATED RINGERS 1000ML (IV SOLUTION) ×2 IMPLANT
IV LR IRRIG 3000ML ARTHROMATIC (IV SOLUTION) IMPLANT
KIT BASIN OR (CUSTOM PROCEDURE TRAY) ×3 IMPLANT
NS IRRIG 1000ML POUR BTL (IV SOLUTION) ×2 IMPLANT
POUCH SPECIMEN RETRIEVAL 10MM (ENDOMECHANICALS) ×3 IMPLANT
RELOAD 45 VASCULAR/THIN (ENDOMECHANICALS) IMPLANT
RELOAD STAPLE 45 2.5 WHT GRN (ENDOMECHANICALS) IMPLANT
RELOAD STAPLE 45 3.5 BLU ETS (ENDOMECHANICALS) IMPLANT
RELOAD STAPLE TA45 3.5 REG BLU (ENDOMECHANICALS) ×6 IMPLANT
SCISSORS LAP 5X35 DISP (ENDOMECHANICALS) IMPLANT
SET IRRIG TUBING LAPAROSCOPIC (IRRIGATION / IRRIGATOR) ×3 IMPLANT
SHEARS HARMONIC ACE PLUS 36CM (ENDOMECHANICALS) ×3 IMPLANT
SLEEVE XCEL OPT CAN 5 100 (ENDOMECHANICALS) ×3 IMPLANT
SOLUTION ANTI FOG 6CC (MISCELLANEOUS) ×3 IMPLANT
SPONGE DRAIN TRACH 4X4 STRL 2S (GAUZE/BANDAGES/DRESSINGS) ×2 IMPLANT
SPONGE GAUZE 2X2 STER 10/PKG (GAUZE/BANDAGES/DRESSINGS) ×2
STRIP CLOSURE SKIN 1/2X4 (GAUZE/BANDAGES/DRESSINGS) ×2 IMPLANT
SUT ETHILON 3 0 PS 1 (SUTURE) ×2 IMPLANT
SUT MNCRL AB 4-0 PS2 18 (SUTURE) ×3 IMPLANT
TOWEL OR 17X26 10 PK STRL BLUE (TOWEL DISPOSABLE) ×3 IMPLANT
TOWEL OR NON WOVEN STRL DISP B (DISPOSABLE) ×3 IMPLANT
TRAY FOLEY CATH 14FRSI W/METER (CATHETERS) ×3 IMPLANT
TRAY LAPAROSCOPIC (CUSTOM PROCEDURE TRAY) ×3 IMPLANT
TROCAR BLADELESS OPT 5 100 (ENDOMECHANICALS) ×3 IMPLANT
TROCAR XCEL BLUNT TIP 100MML (ENDOMECHANICALS) ×3 IMPLANT
TUBING INSUFFLATION 10FT LAP (TUBING) ×3 IMPLANT

## 2014-07-28 NOTE — ED Notes (Signed)
Patient transported to CT 

## 2014-07-28 NOTE — ED Notes (Signed)
Pt c/o right lower quadrant abdominal pain radiating to medial abdominal, nausea, and emesis. Denies diarrhea.

## 2014-07-28 NOTE — H&P (Signed)
Meagan Dean is an 44 y.o. female.   Chief Complaint:   Worsening right lower quadrant pain HPI:  She noted the onset of right lower quadrant pain that was crampy 4 days ago after eating fried chicken. She would drink milk and the pain would get better. The pain almost completely went away but then worsened today. She presented to the emergency department and was noted to have right lower quadrant tenderness. A CT scan was performed demonstrating findings consistent with acute appendicitis. I was asked to see her because of this.  Past Medical History  Diagnosis Date  . Right ureteral stone   . Renal calculus, left     NON-OBSTRUCTING  . History of hypertension     NO ISSUES SINCE WT LOSS AFTER GASTRIC BANDING  . Asthma due to seasonal allergies     MILD--  NO INHALER  . H/O hiatal hernia   . GERD (gastroesophageal reflux disease)   . Urgency of urination   . Endometriosis of pelvis   . History of obstructive sleep apnea     DX'D PRIOR TO GASTRIC BANDING  2010 --  NO ISSUES SINCE WT LOSS    Past Surgical History  Procedure Laterality Date  . Laparoscopic gastric banding  06-06-2008  . Cystoscopy with retrograde pyelogram, ureteroscopy and stent placement Right 06/18/2013    Procedure: [place], URETEROSCOPY AND STENT PLACEMENT;  Surgeon: Franchot Gallo, MD;  Location: [facility];  Service: Urology;  Laterality: Right;  . Laparoscopy /  ovarian cystectomy/  laser ablation endometriosis  2003  . Exploratory lapartomy/  myomectomy  2005  . Cystoscopy with retrograde pyelogram, ureteroscopy and stent placement Right 07/05/2013    Procedure: CYSTOSCOPY , URETEROSCOPY ANDstone extraction;  Surgeon: Franchot Gallo, MD;  Location: [facility];  Service: Urology;  Laterality: Right;    History reviewed. No pertinent family history. Social History:  reports that she has never smoked. She has never used smokeless tobacco. She reports that she drinks  alcohol. She reports that she does not use illicit drugs.  Allergies:  Allergies  Allergen Reactions  . Vicodin [Hydrocodone-Acetaminophen] Other (See Comments)    "very bad reaction"     (Not in a hospital admission)  Results for orders placed or performed during the hospital encounter of 07/28/14 (from the past 48 hour(s))  CBC with Differential     Status: Abnormal   Collection Time: 07/28/14  1:01 PM  Result Value Ref Range   WBC 7.8 4.0 - 10.5 K/uL   RBC 3.97 3.87 - 5.11 MIL/uL   Hemoglobin 11.0 (L) 12.0 - 15.0 g/dL   HCT 34.0 (L) 36.0 - 46.0 %   MCV 85.6 78.0 - 100.0 fL   MCH 27.7 26.0 - 34.0 pg   MCHC 32.4 30.0 - 36.0 g/dL   RDW 13.1 11.5 - 15.5 %   Platelets 386 150 - 400 K/uL   Neutrophils Relative % 68 43 - 77 %   Lymphocytes Relative 25 12 - 46 %   Monocytes Relative 4 3 - 12 %   Eosinophils Relative 2 0 - 5 %   Basophils Relative 1 0 - 1 %   Neutro Abs 5.2 1.7 - 7.7 K/uL   Lymphs Abs 2.0 0.7 - 4.0 K/uL   Monocytes Absolute 0.3 0.1 - 1.0 K/uL   Eosinophils Absolute 0.2 0.0 - 0.7 K/uL   Basophils Absolute 0.1 0.0 - 0.1 K/uL   Smear Review MORPHOLOGY UNREMARKABLE   Comprehensive metabolic  panel     Status: Abnormal   Collection Time: 07/28/14  1:01 PM  Result Value Ref Range   Sodium 142 135 - 145 mmol/L   Potassium 3.4 (L) 3.5 - 5.1 mmol/L   Chloride 105 96 - 112 mmol/L   CO2 27 19 - 32 mmol/L   Glucose, Bld 86 70 - 99 mg/dL   BUN 10 6 - 23 mg/dL   Creatinine, Ser 0.75 0.50 - 1.10 mg/dL   Calcium 9.1 8.4 - 10.5 mg/dL   Total Protein 7.2 6.0 - 8.3 g/dL   Albumin 3.6 3.5 - 5.2 g/dL   AST 12 0 - 37 U/L   ALT 8 0 - 35 U/L   Alkaline Phosphatase 49 39 - 117 U/L   Total Bilirubin 0.7 0.3 - 1.2 mg/dL   GFR calc non Af Amer >90 >90 mL/min   GFR calc Af Amer >90 >90 mL/min    Comment: (NOTE) The eGFR has been calculated using the CKD EPI equation. This calculation has not been validated in all clinical situations. eGFR's persistently <90 mL/min signify  possible Chronic Kidney Disease.    Anion gap 10 5 - 15  Lipase, blood     Status: None   Collection Time: 07/28/14  1:01 PM  Result Value Ref Range   Lipase 25 11 - 59 U/L  Urinalysis, Routine w reflex microscopic     Status: Abnormal   Collection Time: 07/28/14  1:10 PM  Result Value Ref Range   Color, Urine AMBER (A) YELLOW    Comment: BIOCHEMICALS MAY BE AFFECTED BY COLOR   APPearance CLOUDY (A) CLEAR   Specific Gravity, Urine 1.030 1.005 - 1.030   pH 6.0 5.0 - 8.0   Glucose, UA NEGATIVE NEGATIVE mg/dL   Hgb urine dipstick LARGE (A) NEGATIVE   Bilirubin Urine SMALL (A) NEGATIVE   Ketones, ur NEGATIVE NEGATIVE mg/dL   Protein, ur 30 (A) NEGATIVE mg/dL   Urobilinogen, UA 0.2 0.0 - 1.0 mg/dL   Nitrite POSITIVE (A) NEGATIVE   Leukocytes, UA SMALL (A) NEGATIVE  Urine microscopic-add on     Status: Abnormal   Collection Time: 07/28/14  1:10 PM  Result Value Ref Range   Squamous Epithelial / LPF RARE RARE   WBC, UA 7-10 <3 WBC/hpf   RBC / HPF 21-50 <3 RBC/hpf   Bacteria, UA FEW (A) RARE   Urine-Other MICROSCOPIC EXAM PERFORMED ON UNCONCENTRATED URINE   Wet prep, genital     Status: Abnormal   Collection Time: 07/28/14  4:45 PM  Result Value Ref Range   Yeast Wet Prep HPF POC NONE SEEN NONE SEEN   Trich, Wet Prep NONE SEEN NONE SEEN   Clue Cells Wet Prep HPF POC NONE SEEN NONE SEEN   WBC, Wet Prep HPF POC RARE (A) NONE SEEN  POC Urine Pregnancy, ED  (If Pre-menopausal female) - do not order at [facility]     Status: None   Collection Time: 07/28/14  5:58 PM  Result Value Ref Range   Preg Test, Ur NEGATIVE NEGATIVE    Comment:        THE SENSITIVITY OF THIS METHODOLOGY IS >24 mIU/mL    Ct Abdomen Pelvis W Contrast  07/28/2014   CLINICAL DATA:  Right lower quadrant abdominal pain for the past 4 days. Fever. Clinical concern for appendicitis.  EXAM: CT ABDOMEN AND PELVIS WITH CONTRAST  TECHNIQUE: Multidetector CT imaging of the abdomen and pelvis was performed using the standard  protocol following bolus  administration of intravenous contrast.  CONTRAST:  16m OMNIPAQUE IOHEXOL 300 MG/ML  SOLN  COMPARISON:  06/16/2013  FINDINGS: A gastric band and associated tubing is again demonstrated. Small sliding hiatal hernia with mild diffuse wall thickening. Small umbilical hernia containing fat. The appendix is diffusely enlarged with diffuse wall thickening and mucosal enhancement. There is a small proximal appendicolith. Periappendiceal soft tissue stranding is noted. No fluid collections are seen. No free peritoneal air. The appendix is located in the mid right pelvis. It has a maximum diameter of 13.1 mm.  No small or large bowel abnormalities are seen. No enlarged lymph nodes. Multiple uterine masses.  Normal appearing ovaries, urinary bladder, kidneys, adrenal glands, gallbladder, pancreas, liver and spleen. Interval minimal subpleural atelectasis at the right lung base. Mild lumbar and lower thoracic spine degenerative changes.  IMPRESSION: 1. Acute appendicitis without abscess. 2. Small sliding hiatal hernia with mild diffuse wall thickening. The wall thickening may be due to gastritis involving the hiatal hernia. 3. Stable gastric band. 4. Small umbilical hernia containing fat. These results were called by telephone at the time of interpretation on 07/28/2014 at 7:15 pm to Dr. BEvelina Bucy, who verbally acknowledged these results.   Electronically Signed   By: SClaudie ReveringM.D.   On: 07/28/2014 19:17    Review of Systems  Constitutional: Negative for fever and chills.  Respiratory: Negative for cough and shortness of breath.   Gastrointestinal: Positive for nausea. Negative for vomiting, diarrhea and constipation.    Blood pressure 104/67, pulse 66, temperature 98.6 F (37 C), temperature source Oral, resp. rate 16, height '5\' 3"'  (1.6 m), weight 65.772 kg (145 lb), last menstrual period 07/24/2014, SpO2 99 %. Physical Exam  Constitutional: She appears well-developed and  well-nourished. No distress.  HENT:  Head: Normocephalic and atraumatic.  Eyes: No scleral icterus.  Cardiovascular: Normal rate and regular rhythm.   Respiratory: Effort normal and breath sounds normal.  GI: Soft. She exhibits no distension. There is no tenderness.  Multiple small abdominal incisions. Palpable port in the right upper abdomen. Right lower quadrant tenderness and guarding.  Musculoskeletal: She exhibits no edema.  Neurological: She is alert.  Skin: Skin is warm and dry.  Psychiatric: She has a normal mood and affect. Her behavior is normal.     Assessment/Plan Acute appendicitis without evidence of perforation or abscess.  Plan: IV antibiotics. To the operating room for laparoscopic possible open appendectomy. I have discussed the procedure and risks of appendectomy. The risks include but are not limited to bleeding, infection, wound problems, anesthesia, injury to intra-abdominal organs, possibility of postoperative ileus.  She seems to understand and agrees with the plan.  Keaunna Skipper J 07/28/2014, 8:16 PM

## 2014-07-28 NOTE — Transfer of Care (Signed)
Immediate Anesthesia Transfer of Care Note  Patient: Meagan Dean  Procedure(s) Performed: Procedure(s): APPENDECTOMY LAPAROSCOPIC (N/A)  Patient Location: PACU  Anesthesia Type:General  Level of Consciousness: awake, alert  and oriented  Airway & Oxygen Therapy: Patient Spontanous Breathing and Patient connected to face mask oxygen  Post-op Assessment: Report given to RN and Post -op Vital signs reviewed and stable  Post vital signs: Reviewed and stable  Last Vitals:  Filed Vitals:   07/28/14 1935  BP: 104/67  Pulse: 66  Temp: 37 C  Resp: 16    Complications: No apparent anesthesia complications

## 2014-07-28 NOTE — Discharge Instructions (Signed)
[facility] LAPAROSCOPIC SURGERY: POST OP INSTRUCTIONS Always review your discharge instruction sheet given to you by the facility where your surgery was performed. IF YOU HAVE DISABILITY OR FAMILY LEAVE FORMS, YOU MUST BRING THEM TO THE OFFICE FOR PROCESSING.   DO NOT GIVE THEM TO YOUR DOCTOR.  1. A prescription for pain medication may be given to you upon discharge.  Take your pain medication as prescribed, if needed.  If narcotic pain medicine is not needed, then you may take acetaminophen (Tylenol) or ibuprofen (Advil) as needed. 2. Take your usually prescribed medications unless otherwise directed. 3. If you need a refill on your pain medication, please contact your pharmacy.  They will contact our office to request authorization. Prescriptions will not be filled after 5pm or on week-ends. 4. You should follow a light diet the first few days after arrival home, such as soup and crackers, etc.  Be sure to include lots of fluids daily. 5. Most patients will experience some swelling and bruising in the area of the incisions.  Ice packs will help.  Swelling and bruising can take several days to resolve.  6. It is common to experience some constipation if taking pain medication after surgery.  Increasing fluid intake and taking a stool softener (such as Colace) will usually help or prevent this problem from occurring.  A mild laxative (Milk of Magnesia or Miralax) should be taken according to package instructions if there are no bowel movements after 48 hours. 7. Unless discharge instructions indicate otherwise, you may remove your bandages 72 hours after surgery, and you may shower at that time.  You may have steri-strips (small skin tapes) in place directly over the incision.  These strips should be left on the skin.  If your surgeon used skin glue on the incision, you may shower in 24 hours.  The glue will flake off over the next 2-3 weeks.  Any sutures or staples will be  removed at the office during your follow-up visit. 8. ACTIVITIES:  You may resume regular (light) daily activities beginning the next day--such as daily self-care, walking, climbing stairs--gradually increasing activities as tolerated.  You may have sexual intercourse when it is comfortable.  Refrain from any heavy lifting or straining for two weeks. a. You may drive when you are no longer taking prescription pain medication, you can comfortably wear a seatbelt, and you can safely maneuver your car and apply brakes. b. RETURN TO WORK:  __________________________________________________________ 9. You should see your doctor in the office for a follow-up appointment approximately 2-3 weeks after your surgery.  Make sure that you call for this appointment within a day or two after you arrive home to insure a convenient appointment time. 10. OTHER INSTRUCTIONS: __________________________________________________________________________________________________________________________ __________________________________________________________________________________________________________________________ WHEN TO CALL YOUR DOCTOR: 1. Fever over 101.0 2. Inability to urinate 3. Continued bleeding from incision. 4. Increased pain, redness, or drainage from the incision. 5. Increasing abdominal pain  The clinic staff is available to answer your questions during regular business hours.  Please dont hesitate to call and ask to speak to one of the nurses for clinical concerns.  If you have a medical emergency, go to the nearest emergency room or call 911.  A surgeon from [facility] is always on call at the hospital. [address], [place], [place], [place]  [address] ? [address], [place], [place]   [address] [phone] ? [phone] ? FAX [phone] Web site: [email or web address]

## 2014-07-28 NOTE — Anesthesia Procedure Notes (Signed)
Procedure Name: Intubation Date/Time: 07/28/2014 9:07 PM Performed by: Ofilia Neas Pre-anesthesia Checklist: Patient identified, Timeout performed, Emergency Drugs available, Suction available and Patient being monitored Patient Re-evaluated:Patient Re-evaluated prior to inductionOxygen Delivery Method: Circle system utilized Preoxygenation: Pre-oxygenation with 100% oxygen Intubation Type: IV induction, Cricoid Pressure applied and Rapid sequence Laryngoscope Size: Mac and 4 Grade View: Grade II Tube type: Oral Tube size: 7.5 mm Number of attempts: 1 Airway Equipment and Method: Stylet Placement Confirmation: ETT inserted through vocal cords under direct vision,  positive ETCO2 and breath sounds checked- equal and bilateral Secured at: 21 cm Tube secured with: Tape Dental Injury: Teeth and Oropharynx as per pre-operative assessment

## 2014-07-28 NOTE — ED Notes (Signed)
Patient is aware of urine sample 

## 2014-07-28 NOTE — ED Notes (Signed)
Pt taken to OR.

## 2014-07-28 NOTE — Anesthesia Preprocedure Evaluation (Signed)
Anesthesia Evaluation  Patient identified by MRN, date of birth, ID band Patient awake    Reviewed: Allergy & Precautions, NPO status , Patient's Chart, lab work & pertinent test results  Airway Mallampati: I       Dental  (+) Teeth Intact   Pulmonary  breath sounds clear to auscultation        Cardiovascular Rhythm:Regular Rate:Normal     Neuro/Psych    GI/Hepatic hiatal hernia, GERD-  ,Previous gastric banding   Endo/Other    Renal/GU      Musculoskeletal   Abdominal   Peds  Hematology  (+) Blood dyscrasia, anemia ,   Anesthesia Other Findings   Reproductive/Obstetrics                             Anesthesia Physical Anesthesia Plan  ASA: II  Anesthesia Plan: General   Post-op Pain Management:    Induction: Intravenous  Airway Management Planned: Oral ETT  Additional Equipment:   Intra-op Plan:   Post-operative Plan: Extubation in OR  Informed Consent: I have reviewed the patients History and Physical, chart, labs and discussed the procedure including the risks, benefits and alternatives for the proposed anesthesia with the patient or authorized representative who has indicated his/her understanding and acceptance.   Dental advisory given  Plan Discussed with: CRNA and Surgeon  Anesthesia Plan Comments:         Anesthesia Quick Evaluation

## 2014-07-28 NOTE — Op Note (Signed)
Appendectomy, Lap, Procedure Note  Pre-operative Diagnosis: Acute appendicitis  Post-operative Diagnosis: Perforated appendicitis  Procedure:  Laparoscopic appendectomy  Surgeon:  Jackolyn Confer, M.D.  Anesthesia:  General   Indications:  This is a 44 year old female who presented to the emergency department with worsening right lower quadrant pain. CT scan was consistent with acute appendicitis without complications. She is now brought to the operating room for appendectomy.  Procedure Details   She was brought to the operating room, placed in the supine position and general anesthesia was induced, along with placement of orogastric tube, SCDs, and a Foley catheter. A timeout was performed. The abdomen was prepped and draped in a sterile fashion. A small infraumbilical incision was made through the skin, subcutaneous tissue, fascia, and peritoneum entering the peritoneal cavity under direct vision.  A pursestring suture was passed around the fascia with a 0 Vicryl.  The Hasson was introduced into the peritoneal cavity and the tails of the suture were used to hold the Hasson in place.   The pneumoperitoneum was then established to steady pressure of 15 mmHg.   The laparoscope was introduced and there was no evidence of bleeding or underlying organ injury. Additional 5 mm cannulas then placed in the left lower quadrant of the abdomen and the right upper quadrant region under direct visualization. A careful evaluation of the entire abdomen was carried out. The patient was placed in Trendelenburg and left lateral decubitus position. The small intestines were retracted in the cephalad and left lateral direction away from the pelvis and right lower quadrant. Tubing from her lap band was identified.     The antimesenteric fat of the distal ileum was densely adherent to the appendix which was inflamed. Upon dissecting this free and area of perforation was noted with a fecalith that was retrieved and  removed. The appendix was mobilized from the posterior abdominal wall and the mesoappendix was divided with the Harmonic scalpel. The appendix was then amputated off the cecum, with a small cuff of cecum using the linear cutting stapler. The appendix was placed in an Endopouch bag and removed through the subumbilical incision.  Copious irrigation was then performed of the right lower quadrant area. There is bleeding from the staple line which was controlled with hemoclips. Inspection of the staple line at this time demonstrated no further evidence of bleeding and no evidence of leak. A 19 Blake drain was then placed into the abdominal cavity through the subumbilical port. It was brought out the left lower quadrant incision and anchored to the skin with 3-0 nylon suture. It was positioned down in the right pelvis and in the right gutter.  The umbilical trocar was removed and the  port site fascia was closed via the purse string suture under laparoscopic vision. There was no residual palpable fascial defect.  The remaining trocar was removed and all  trocar site skin wounds were closed with 4-0 Monocryl.  Instrument, sponge, and needle counts were correct at the conclusion of the case.   Findings: The appendix was found to be inflamed. There were not signs of necrosis.  There was perforation. There was not abscess formation.  Estimated Blood Loss:  200 mL         Drains: #19 Blake drain         Specimens: Appendix         Complications:  None; patient tolerated the procedure well.         Disposition: PACU - hemodynamically stable.  Condition: stable

## 2014-07-28 NOTE — ED Provider Notes (Signed)
CSN: [id number]     Arrival date & time 07/28/14  1234 History   First MD Initiated Contact with Patient 07/28/14 1523     Chief Complaint  Patient presents with  . Abdominal Pain     (Consider location/radiation/quality/duration/timing/severity/associated sxs/prior Treatment) Patient is a 44 y.o. female presenting with abdominal pain. The history is provided by the patient.  Abdominal Pain Pain location:  RLQ Pain quality comment:  Pulling Pain radiates to:  Does not radiate Pain severity:  Moderate Onset quality:  Gradual Duration:  3 days Timing:  Intermittent Progression:  Unchanged Chronicity:  New Context: not recent illness, not recent sexual activity and not trauma   Relieved by:  Nothing Worsened by:  Nothing tried Associated symptoms: no chills, no cough, no fever, no shortness of breath and no vomiting     Past Medical History  Diagnosis Date  . Right ureteral stone   . Renal calculus, left     NON-OBSTRUCTING  . History of hypertension     NO ISSUES SINCE WT LOSS AFTER GASTRIC BANDING  . Asthma due to seasonal allergies     MILD--  NO INHALER  . H/O hiatal hernia   . GERD (gastroesophageal reflux disease)   . Urgency of urination   . Endometriosis of pelvis   . History of obstructive sleep apnea     DX'D PRIOR TO GASTRIC BANDING  2010 --  NO ISSUES SINCE WT LOSS   Past Surgical History  Procedure Laterality Date  . Laparoscopic gastric banding  06-06-2008  . Cystoscopy with retrograde pyelogram, ureteroscopy and stent placement Right 06/18/2013    Procedure: [place], URETEROSCOPY AND STENT PLACEMENT;  Surgeon: Franchot Gallo, MD;  Location: [facility];  Service: Urology;  Laterality: Right;  . Laparoscopy /  ovarian cystectomy/  laser ablation endometriosis  2003  . Exploratory lapartomy/  myomectomy  2005  . Cystoscopy with retrograde pyelogram, ureteroscopy and stent placement Right 07/05/2013    Procedure: CYSTOSCOPY ,  URETEROSCOPY ANDstone extraction;  Surgeon: Franchot Gallo, MD;  Location: [facility];  Service: Urology;  Laterality: Right;   History reviewed. No pertinent family history. History  Substance Use Topics  . Smoking status: Never Smoker   . Smokeless tobacco: Never Used  . Alcohol Use: Yes     Comment: occasionally   OB History    No data available     Review of Systems  Constitutional: Negative for fever and chills.  Respiratory: Negative for cough and shortness of breath.   Gastrointestinal: Negative for vomiting and abdominal pain.  All other systems reviewed and are negative.     Allergies  Vicodin  Home Medications   Prior to Admission medications   Medication Sig Start Date End Date Taking? Authorizing Provider  acetaminophen (TYLENOL) 325 MG tablet Take 650 mg by mouth every 6 (six) hours as needed.    Historical Provider, MD  potassium chloride (K-DUR) 10 MEQ tablet Take 1 tablet (10 mEq total) by mouth 2 (two) times daily. 06/19/13   Alexis Frock, MD  pseudoephedrine (SUDAFED) 30 MG tablet Take 30 mg by mouth every 4 (four) hours as needed for congestion.    Historical Provider, MD  sulfamethoxazole-trimethoprim (BACTRIM DS) 800-160 MG per tablet Take 1 tablet by mouth 2 (two) times daily. 07/05/13   Franchot Gallo, MD  traMADol (ULTRAM) 50 MG tablet Take 1 tablet (50 mg total) by mouth every 8 (eight) hours as needed for moderate pain (Flank pain). 07/03/13  Reyne Dumas, MD   BP 113/72 mmHg  Pulse 78  Temp(Src) 98.2 F (36.8 C) (Oral)  Resp 18  SpO2 100%  LMP 07/24/2014 Physical Exam  Constitutional: She is oriented to person, place, and time. She appears well-developed and well-nourished. No distress.  HENT:  Head: Normocephalic and atraumatic.  Mouth/Throat: Oropharynx is clear and moist.  Eyes: EOM are normal. Pupils are equal, round, and reactive to light.  Neck: Normal range of motion. Neck supple.  Cardiovascular: Normal rate and  regular rhythm.  Exam reveals no friction rub.   No murmur heard. Pulmonary/Chest: Effort normal and breath sounds normal. No respiratory distress. She has no wheezes. She has no rales.  Abdominal: Soft. She exhibits no distension. There is tenderness (RLQ.) in the right lower quadrant and left lower quadrant. There is tenderness at McBurney's point (and positive Rovsing's sign). There is no rebound. No hernia. Hernia confirmed negative in the ventral area.  Genitourinary: Cervix exhibits no motion tenderness and no discharge. Right adnexum displays tenderness (very mild). Right adnexum displays no mass and no fullness. Left adnexum displays no mass, no tenderness and no fullness.  Musculoskeletal: Normal range of motion. She exhibits no edema.  Neurological: She is alert and oriented to person, place, and time. No cranial nerve deficit. She exhibits normal muscle tone. Coordination normal.  Skin: No rash noted. She is not diaphoretic.  Nursing note and vitals reviewed.   ED Course  Procedures (including critical care time) Labs Review Labs Reviewed  CBC WITH DIFFERENTIAL/PLATELET - Abnormal; Notable for the following:    Hemoglobin 11.0 (*)    HCT 34.0 (*)    All other components within normal limits  COMPREHENSIVE METABOLIC PANEL - Abnormal; Notable for the following:    Potassium 3.4 (*)    All other components within normal limits  URINALYSIS, ROUTINE W REFLEX MICROSCOPIC - Abnormal; Notable for the following:    Color, Urine AMBER (*)    APPearance CLOUDY (*)    Hgb urine dipstick LARGE (*)    Bilirubin Urine SMALL (*)    Protein, ur 30 (*)    Nitrite POSITIVE (*)    Leukocytes, UA SMALL (*)    All other components within normal limits  URINE MICROSCOPIC-ADD ON - Abnormal; Notable for the following:    Bacteria, UA FEW (*)    All other components within normal limits  WET PREP, GENITAL  LIPASE, BLOOD  POC URINE PREG, ED    Imaging Review Ct Abdomen Pelvis W  Contrast  07/28/2014   CLINICAL DATA:  Right lower quadrant abdominal pain for the past 4 days. Fever. Clinical concern for appendicitis.  EXAM: CT ABDOMEN AND PELVIS WITH CONTRAST  TECHNIQUE: Multidetector CT imaging of the abdomen and pelvis was performed using the standard protocol following bolus administration of intravenous contrast.  CONTRAST:  168mL OMNIPAQUE IOHEXOL 300 MG/ML  SOLN  COMPARISON:  06/16/2013  FINDINGS: A gastric band and associated tubing is again demonstrated. Small sliding hiatal hernia with mild diffuse wall thickening. Small umbilical hernia containing fat. The appendix is diffusely enlarged with diffuse wall thickening and mucosal enhancement. There is a small proximal appendicolith. Periappendiceal soft tissue stranding is noted. No fluid collections are seen. No free peritoneal air. The appendix is located in the mid right pelvis. It has a maximum diameter of 13.1 mm.  No small or large bowel abnormalities are seen. No enlarged lymph nodes. Multiple uterine masses.  Normal appearing ovaries, urinary bladder, kidneys, adrenal glands, gallbladder, pancreas, liver  and spleen. Interval minimal subpleural atelectasis at the right lung base. Mild lumbar and lower thoracic spine degenerative changes.  IMPRESSION: 1. Acute appendicitis without abscess. 2. Small sliding hiatal hernia with mild diffuse wall thickening. The wall thickening may be due to gastritis involving the hiatal hernia. 3. Stable gastric band. 4. Small umbilical hernia containing fat. These results were called by telephone at the time of interpretation on 07/28/2014 at 7:15 pm to Dr. Evelina Bucy , who verbally acknowledged these results.   Electronically Signed   By: Claudie Revering M.D.   On: 07/28/2014 19:17     EKG Interpretation None      MDM   Final diagnoses:  RLQ abdominal pain  Acute appendicitis with localized peritonitis    45F here with RLQ pain. Present for 3 days. Intermittent pain, feels like  pulling. Mild associated nausea, one episode of emesis. No fevers. Denies dysuria, vaginal bleeding. Does have hx of ovarian cysts, fibroids. Does have history of kidney stones. AFVSS here. On exam, RLQ pain, marked Rovsing's sign. No flank pain.  Plan on CT scan. Will also perform pelvic exam.  CT shows acute appendicitis. Surgery consulted.   I have reviewed all labs and imaging and considered them in my medical decision making.   Evelina Bucy, MD 07/28/14 [phone]

## 2014-07-29 ENCOUNTER — Encounter (HOSPITAL_COMMUNITY): Payer: Self-pay | Admitting: General Surgery

## 2014-07-29 LAB — BASIC METABOLIC PANEL
Anion gap: 10 (ref 5–15)
BUN: 8 mg/dL (ref 6–23)
CHLORIDE: 101 mmol/L (ref 96–112)
CO2: 28 mmol/L (ref 19–32)
Calcium: 8.2 mg/dL — ABNORMAL LOW (ref 8.4–10.5)
Creatinine, Ser: 0.68 mg/dL (ref 0.50–1.10)
GFR calc Af Amer: >90 mL/min (ref >90)
Glucose, Bld: 136 mg/dL — ABNORMAL HIGH (ref 70–99)
POTASSIUM: 3.5 mmol/L (ref 3.5–5.1)
Sodium: 139 mmol/L (ref 135–145)

## 2014-07-29 LAB — CBC
HEMATOCRIT: 32.2 % — AB (ref 36.0–46.0)
Hemoglobin: 10.7 g/dL — ABNORMAL LOW (ref 12.0–15.0)
MCH: 28.5 pg (ref 26.0–34.0)
MCHC: 33.2 g/dL (ref 30.0–36.0)
MCV: 85.9 fL (ref 78.0–100.0)
Platelets: 357 10*3/uL (ref 150–400)
RBC: 3.75 MIL/uL — ABNORMAL LOW (ref 3.87–5.11)
RDW: 13.2 % (ref 11.5–15.5)
WBC: 13.2 10*3/uL — ABNORMAL HIGH (ref 4.0–10.5)

## 2014-07-29 LAB — GC/CHLAMYDIA PROBE AMP (~~LOC~~) NOT AT ARMC
CHLAMYDIA, DNA PROBE: NEGATIVE
Neisseria Gonorrhea: NEGATIVE

## 2014-07-29 MED ORDER — LORATADINE 10 MG PO TABS
10.0000 mg | ORAL_TABLET | Freq: Every day | ORAL | Status: DC
  Administered 2014-07-29 – 2014-08-04 (×7): 10 mg via ORAL
  Filled 2014-07-29 (×7): qty 1

## 2014-07-29 NOTE — Progress Notes (Signed)
Patient ID: Meagan Dean, female   DOB: 04/23/1971, 44 y.o.   MRN: [id number] 1 Day Post-Op  Subjective: Has some significant right lower quadrant abdominal pain. Denies nausea.  Objective: Vital signs in last 24 hours: Temp:  [96.6 F (35.9 C)-98.8 F (37.1 C)] 98.7 F (37.1 C) (03/18 0536) Pulse Rate:  [50-78] 50 (03/18 0536) Resp:  [16-20] 18 (03/18 0536) BP: (93-130)/(51-72) 93/52 mmHg (03/18 0536) SpO2:  [99 %-100 %] 100 % (03/18 0536) Weight:  [65.772 kg (145 lb)] 65.772 kg (145 lb) (03/17 1531) Last BM Date: 07/28/14  Intake/Output from previous day: 03/17 0701 - 03/18 0700 In: 2220 [P.O.:120; I.V.:2100] Out: 965 [Urine:750; Drains:215] Intake/Output this shift:    General appearance: alert, cooperative and mild distress GI: moderate tenderness and some guarding in the right lower quadrant. JP drain with serous sanguinous drainage. Incision/Wound: clean and dry  Lab Results:   Recent Labs  07/28/14 1301 07/29/14 0510  WBC 7.8 13.2*  HGB 11.0* 10.7*  HCT 34.0* 32.2*  PLT 386 357   BMET  Recent Labs  07/28/14 1301 07/29/14 0510  NA 142 139  K 3.4* 3.5  CL 105 101  CO2 27 28  GLUCOSE 86 136*  BUN 10 8  CREATININE 0.75 0.68  CALCIUM 9.1 8.2*     Studies/Results: Ct Abdomen Pelvis W Contrast  07/28/2014   CLINICAL DATA:  Right lower quadrant abdominal pain for the past 4 days. Fever. Clinical concern for appendicitis.  EXAM: CT ABDOMEN AND PELVIS WITH CONTRAST  TECHNIQUE: Multidetector CT imaging of the abdomen and pelvis was performed using the standard protocol following bolus administration of intravenous contrast.  CONTRAST:  173mL OMNIPAQUE IOHEXOL 300 MG/ML  SOLN  COMPARISON:  06/16/2013  FINDINGS: A gastric band and associated tubing is again demonstrated. Small sliding hiatal hernia with mild diffuse wall thickening. Small umbilical hernia containing fat. The appendix is diffusely enlarged with diffuse wall thickening and mucosal enhancement.  There is a small proximal appendicolith. Periappendiceal soft tissue stranding is noted. No fluid collections are seen. No free peritoneal air. The appendix is located in the mid right pelvis. It has a maximum diameter of 13.1 mm.  No small or large bowel abnormalities are seen. No enlarged lymph nodes. Multiple uterine masses.  Normal appearing ovaries, urinary bladder, kidneys, adrenal glands, gallbladder, pancreas, liver and spleen. Interval minimal subpleural atelectasis at the right lung base. Mild lumbar and lower thoracic spine degenerative changes.  IMPRESSION: 1. Acute appendicitis without abscess. 2. Small sliding hiatal hernia with mild diffuse wall thickening. The wall thickening may be due to gastritis involving the hiatal hernia. 3. Stable gastric band. 4. Small umbilical hernia containing fat. These results were called by telephone at the time of interpretation on 07/28/2014 at 7:15 pm to Dr. Evelina Bucy , who verbally acknowledged these results.   Electronically Signed   By: Claudie Revering M.D.   On: 07/28/2014 19:17    Anti-infectives: Anti-infectives    Start     Dose/Rate Route Frequency Ordered Stop   07/29/14 0000  piperacillin-tazobactam (ZOSYN) IVPB 3.375 g     3.375 g 12.5 mL/hr over 240 Minutes Intravenous Every 8 hours 07/28/14 2349     07/28/14 2015  cefTRIAXone (ROCEPHIN) 1 g in dextrose 5 % 50 mL IVPB     1 g 100 mL/hr over 30 Minutes Intravenous  Once 07/28/14 2005 07/28/14 2030      Assessment/Plan: s/p Procedure(s): APPENDECTOMY LAPAROSCOPIC Perforated appendicitis. Stable postoperatively. Continue IV antibiotics. History  of lap band for morbid obesity.   LOS: 1 day    Briannia Laba T 07/29/2014

## 2014-07-29 NOTE — Progress Notes (Signed)
CARE MANAGEMENT NOTE 07/29/2014  Patient:  LATESIA, NORRINGTON   Account Number:  [id number]  Date Initiated:  07/29/2014  Documentation initiated by:  Peri Kreft  Subjective/Objective Assessment:   PERFORATED APPDENIX REQUIRING SURGERY AND POST OP INFECTION CONTROLL     Action/Plan:   home when stable   Anticipated DC Date:  08/01/2014   Anticipated DC Plan:  HOME/SELF CARE  In-house referral  NA      DC Planning Services  CM consult      Choice offered to / List presented to:             Status of service:  In process, will continue to follow Medicare Important Message given?   (If response is "NO", the following Medicare IM given date fields will be blank) Date Medicare IM given:   Medicare IM given by:   Date Additional Medicare IM given:   Additional Medicare IM given by:    Discharge Disposition:    Per UR Regulation:  Reviewed for med. necessity/level of care/duration of stay  If discussed at [place] of Stay Meetings, dates discussed:    Comments:  July 29, 2014/Raychel Dowler L. Rosana Hoes, RN, BSN, CCM. Case Management [place] [phone] No discharge needs present of time of review.

## 2014-07-30 LAB — BASIC METABOLIC PANEL
Anion gap: 11 (ref 5–15)
BUN: 10 mg/dL (ref 6–23)
CALCIUM: 8.2 mg/dL — AB (ref 8.4–10.5)
CO2: 28 mmol/L (ref 19–32)
CREATININE: 0.72 mg/dL (ref 0.50–1.10)
Chloride: 103 mmol/L (ref 96–112)
GFR calc non Af Amer: >90 mL/min (ref >90)
GLUCOSE: 88 mg/dL (ref 70–99)
Potassium: 3 mmol/L — ABNORMAL LOW (ref 3.5–5.1)
Sodium: 142 mmol/L (ref 135–145)

## 2014-07-30 LAB — CBC
HCT: 28 % — ABNORMAL LOW (ref 36.0–46.0)
HEMOGLOBIN: 9.2 g/dL — AB (ref 12.0–15.0)
MCH: 28.6 pg (ref 26.0–34.0)
MCHC: 32.9 g/dL (ref 30.0–36.0)
MCV: 87 fL (ref 78.0–100.0)
Platelets: 299 10*3/uL (ref 150–400)
RBC: 3.22 MIL/uL — AB (ref 3.87–5.11)
RDW: 13.2 % (ref 11.5–15.5)
WBC: 9.5 10*3/uL (ref 4.0–10.5)

## 2014-07-30 MED ORDER — PROMETHAZINE HCL 25 MG/ML IJ SOLN
12.5000 mg | INTRAMUSCULAR | Status: DC | PRN
  Administered 2014-07-30 – 2014-08-02 (×5): 12.5 mg via INTRAVENOUS
  Filled 2014-07-30 (×6): qty 1

## 2014-07-30 NOTE — Progress Notes (Signed)
Patient ID: Meagan Dean, female   DOB: 1970-07-17, 44 y.o.   MRN: [id number] 2 Days Post-Op  Subjective: Having some nausea, still with RLQ pain managed well with meds.  Has been up walking  Objective: Vital signs in last 24 hours: Temp:  [98.2 F (36.8 C)-99.1 F (37.3 C)] 98.2 F (36.8 C) (03/19 0538) Pulse Rate:  [55-75] 75 (03/19 0538) Resp:  [16-18] 16 (03/19 0538) BP: (94-102)/(58-69) 102/69 mmHg (03/19 0538) SpO2:  [97 %-100 %] 100 % (03/19 0538) Last BM Date: 07/28/14  Intake/Output from previous day: 03/18 0701 - 03/19 0700 In: 1578.3 [P.O.:60; I.V.:1418.3; IV Piggyback:100] Out: 700 [Urine:600; Drains:100] Intake/Output this shift: Total I/O In: -  Out: 22 [Drains:22]  General appearance: alert, cooperative and no distress GI: abnormal findings:  moderate tenderness in the RLQ and with guarding Incision/Wound: Clean and dry.  JP drainage cloudy serosanguinous  Lab Results:   Recent Labs  07/29/14 0510 07/30/14 0533  WBC 13.2* 9.5  HGB 10.7* 9.2*  HCT 32.2* 28.0*  PLT 357 299   BMET  Recent Labs  07/29/14 0510 07/30/14 0533  NA 139 142  K 3.5 3.0*  CL 101 103  CO2 28 28  GLUCOSE 136* 88  BUN 8 10  CREATININE 0.68 0.72  CALCIUM 8.2* 8.2*     Studies/Results: Ct Abdomen Pelvis W Contrast  07/28/2014   CLINICAL DATA:  Right lower quadrant abdominal pain for the past 4 days. Fever. Clinical concern for appendicitis.  EXAM: CT ABDOMEN AND PELVIS WITH CONTRAST  TECHNIQUE: Multidetector CT imaging of the abdomen and pelvis was performed using the standard protocol following bolus administration of intravenous contrast.  CONTRAST:  155mL OMNIPAQUE IOHEXOL 300 MG/ML  SOLN  COMPARISON:  06/16/2013  FINDINGS: A gastric band and associated tubing is again demonstrated. Small sliding hiatal hernia with mild diffuse wall thickening. Small umbilical hernia containing fat. The appendix is diffusely enlarged with diffuse wall thickening and mucosal enhancement.  There is a small proximal appendicolith. Periappendiceal soft tissue stranding is noted. No fluid collections are seen. No free peritoneal air. The appendix is located in the mid right pelvis. It has a maximum diameter of 13.1 mm.  No small or large bowel abnormalities are seen. No enlarged lymph nodes. Multiple uterine masses.  Normal appearing ovaries, urinary bladder, kidneys, adrenal glands, gallbladder, pancreas, liver and spleen. Interval minimal subpleural atelectasis at the right lung base. Mild lumbar and lower thoracic spine degenerative changes.  IMPRESSION: 1. Acute appendicitis without abscess. 2. Small sliding hiatal hernia with mild diffuse wall thickening. The wall thickening may be due to gastritis involving the hiatal hernia. 3. Stable gastric band. 4. Small umbilical hernia containing fat. These results were called by telephone at the time of interpretation on 07/28/2014 at 7:15 pm to Dr. Evelina Bucy , who verbally acknowledged these results.   Electronically Signed   By: Claudie Revering M.D.   On: 07/28/2014 19:17    Anti-infectives: Anti-infectives    Start     Dose/Rate Route Frequency Ordered Stop   07/29/14 0000  piperacillin-tazobactam (ZOSYN) IVPB 3.375 g     3.375 g 12.5 mL/hr over 240 Minutes Intravenous Every 8 hours 07/28/14 2349     07/28/14 2015  cefTRIAXone (ROCEPHIN) 1 g in dextrose 5 % 50 mL IVPB     1 g 100 mL/hr over 30 Minutes Intravenous  Once 07/28/14 2005 07/28/14 2030      Assessment/Plan: s/p Procedure(s): APPENDECTOMY LAPAROSCOPIC Perforated appendicitis Somewhat improved with normalized  WBC Expected ileus, continue NPO Continue IV abx Ambulation encouraged   LOS: 2 days    Taelynn Mcelhannon T 07/30/2014

## 2014-07-31 MED ORDER — DIPHENHYDRAMINE HCL 12.5 MG/5ML PO ELIX: 25.0000 mg | ORAL_SOLUTION | Freq: Four times a day (QID) | ORAL | Status: DC | PRN

## 2014-07-31 NOTE — Progress Notes (Signed)
Patient ID: Meagan Dean, female   DOB: Feb 13, 1971, 44 y.o.   MRN: [id number] 3 Days Post-Op  Subjective: Feels gradually a little better.  Still some significant lower abd pain but relieved with meds and improving.  Some nausea yesterday but none this AM  Objective: Vital signs in last 24 hours: Temp:  [98.3 F (36.8 C)-99.1 F (37.3 C)] 99.1 F (37.3 C) (03/20 0500) Pulse Rate:  [65-76] 65 (03/20 0500) Resp:  [16-20] 20 (03/20 0500) BP: (92-105)/(57-66) 92/57 mmHg (03/20 0500) SpO2:  [100 %] 100 % (03/20 0500) Last BM Date: 07/28/14  Intake/Output from previous day: 03/19 0701 - 03/20 0700 In: 3181.7 [P.O.:600; I.V.:2481.7; IV Piggyback:100] Out: 409 [Urine:325; Drains:84] Intake/Output this shift: Total I/O In: 120 [P.O.:120] Out: 18 [Drains:18]  General appearance: alert, cooperative and no distress GI: Moderate lower abdominal tenderness with some guarding.  Non distended.  JP drainage is sero sanguinous Incision/Wound: No erythema or drainage  Lab Results:   Recent Labs  07/29/14 0510 07/30/14 0533  WBC 13.2* 9.5  HGB 10.7* 9.2*  HCT 32.2* 28.0*  PLT 357 299   BMET  Recent Labs  07/29/14 0510 07/30/14 0533  NA 139 142  K 3.5 3.0*  CL 101 103  CO2 28 28  GLUCOSE 136* 88  BUN 8 10  CREATININE 0.68 0.72  CALCIUM 8.2* 8.2*     Studies/Results: No results found.  Anti-infectives: Anti-infectives    Start     Dose/Rate Route Frequency Ordered Stop   07/29/14 0000  piperacillin-tazobactam (ZOSYN) IVPB 3.375 g     3.375 g 12.5 mL/hr over 240 Minutes Intravenous Every 8 hours 07/28/14 2349     07/28/14 2015  cefTRIAXone (ROCEPHIN) 1 g in dextrose 5 % 50 mL IVPB     1 g 100 mL/hr over 30 Minutes Intravenous  Once 07/28/14 2005 07/28/14 2030      Assessment/Plan: s/p Procedure(s): APPENDECTOMY LAPAROSCOPIC Perforated appendicitis Hx of lap band placement with successful weight loss Improving slowly Advanced to bariatric full liquid  diet Continue IV abx Lab in AM    LOS: 3 days    Meagan Dean 07/31/2014

## 2014-08-01 ENCOUNTER — Inpatient Hospital Stay (HOSPITAL_COMMUNITY): Payer: Self-pay

## 2014-08-01 ENCOUNTER — Encounter (HOSPITAL_COMMUNITY): Payer: Self-pay | Admitting: Radiology

## 2014-08-01 LAB — BASIC METABOLIC PANEL
Anion gap: 5 (ref 5–15)
BUN: 7 mg/dL (ref 6–23)
CO2: 30 mmol/L (ref 19–32)
Calcium: 8 mg/dL — ABNORMAL LOW (ref 8.4–10.5)
Chloride: 100 mmol/L (ref 96–112)
Creatinine, Ser: 0.8 mg/dL (ref 0.50–1.10)
GFR calc Af Amer: >90 mL/min (ref >90)
GFR calc non Af Amer: 89 mL/min — ABNORMAL LOW (ref >90)
GLUCOSE: 89 mg/dL (ref 70–99)
Potassium: 3.3 mmol/L — ABNORMAL LOW (ref 3.5–5.1)
SODIUM: 135 mmol/L (ref 135–145)

## 2014-08-01 LAB — CBC
HCT: 26.7 % — ABNORMAL LOW (ref 36.0–46.0)
HEMOGLOBIN: 8.5 g/dL — AB (ref 12.0–15.0)
MCH: 27.6 pg (ref 26.0–34.0)
MCHC: 31.8 g/dL (ref 30.0–36.0)
MCV: 86.7 fL (ref 78.0–100.0)
PLATELETS: 323 10*3/uL (ref 150–400)
RBC: 3.08 MIL/uL — ABNORMAL LOW (ref 3.87–5.11)
RDW: 13.1 % (ref 11.5–15.5)
WBC: 6.5 10*3/uL (ref 4.0–10.5)

## 2014-08-01 MED ORDER — POTASSIUM CHLORIDE CRYS ER 20 MEQ PO TBCR
40.0000 meq | EXTENDED_RELEASE_TABLET | Freq: Once | ORAL | Status: AC
  Administered 2014-08-01: 40 meq via ORAL
  Filled 2014-08-01: qty 2

## 2014-08-01 MED ORDER — IOHEXOL 300 MG/ML  SOLN
100.0000 mL | Freq: Once | INTRAMUSCULAR | Status: AC | PRN
  Administered 2014-08-01: 100 mL via INTRAVENOUS

## 2014-08-01 MED ORDER — POTASSIUM CHLORIDE 20 MEQ PO PACK: 40.0000 meq | PACK | Freq: Once | ORAL | Status: DC

## 2014-08-01 MED ORDER — IOHEXOL 300 MG/ML  SOLN
25.0000 mL | INTRAMUSCULAR | Status: AC
  Administered 2014-08-01: 50 mL via ORAL

## 2014-08-01 NOTE — Progress Notes (Signed)
Patient ID: Meagan Dean, female   DOB: 12/29/1970, 44 y.o.   MRN: [id number]     [address] [place], [place] [address]    Phone: [phone] FAX: [phone]     Subjective: Nauseated.  No vomiting.  Passing flatus.  Tolerating fulls. Ambulating.  34m from JP drain.    Objective:  Vital signs:  Filed Vitals:   07/31/14 0500 07/31/14 1508 07/31/14 2231 08/01/14 0604  BP: 92/57 103/65 101/61 93/63  Pulse: 65 54 62 52  Temp: 99.1 F (37.3 C) 98.7 F (37.1 C) 98.9 F (37.2 C) 98.2 F (36.8 C)  TempSrc: Oral Oral Oral Oral  Resp: '20 18 16 17  ' Height:      Weight:      SpO2: 100% 100% 98% 100%    Last BM Date: 07/28/14  Intake/Output   Yesterday:  03/20 0701 - 03/21 0700 In: 2538.3 [P.O.:580; I.V.:1908.3; IV Piggyback:50] Out: 812 [Urine:750; Drains:62] This shift: I/O last 3 completed shifts: In: 3738.3 [P.O.:580; I.V.:3108.3; IV Piggyback:50] Out: 8100[Urine:750; Drains:99]     Physical Exam: General: Pt awake/alert/oriented x4 in no acute distress Chest: cta. No chest wall pain w good excursion CV:  Pulses intact.  Regular rhythm MS: Normal AROM mjr joints.  No obvious deformity Abdomen: Soft.  Nondistended.  Incisions are c/d/i.  JP drain with serosang output.  Mildly tender at incisions only.  No evidence of peritonitis.  No incarcerated hernias. Ext:  SCDs BLE.  No mjr edema.  No cyanosis Skin: No petechiae / purpura   Problem List:   Active Problems:   Perforated appendicitis    Results:   Labs: Results for orders placed or performed during the hospital encounter of 07/28/14 (from the past 48 hour(s))  CBC     Status: Abnormal   Collection Time: 08/01/14  4:50 AM  Result Value Ref Range   WBC 6.5 4.0 - 10.5 K/uL   RBC 3.08 (L) 3.87 - 5.11 MIL/uL   Hemoglobin 8.5 (L) 12.0 - 15.0 g/dL   HCT 26.7 (L) 36.0 - 46.0 %   MCV 86.7 78.0 - 100.0 fL   MCH 27.6 26.0 - 34.0 pg   MCHC  31.8 30.0 - 36.0 g/dL   RDW 13.1 11.5 - 15.5 %   Platelets 323 150 - 400 K/uL  Basic metabolic panel     Status: Abnormal   Collection Time: 08/01/14  4:50 AM  Result Value Ref Range   Sodium 135 135 - 145 mmol/L    Comment: DELTA CHECK NOTED REPEATED TO VERIFY    Potassium 3.3 (L) 3.5 - 5.1 mmol/L   Chloride 100 96 - 112 mmol/L   CO2 30 19 - 32 mmol/L   Glucose, Bld 89 70 - 99 mg/dL   BUN 7 6 - 23 mg/dL   Creatinine, Ser 0.80 0.50 - 1.10 mg/dL   Calcium 8.0 (L) 8.4 - 10.5 mg/dL   GFR calc non Af Amer 89 (L) >90 mL/min   GFR calc Af Amer >90 >90 mL/min    Comment: (NOTE) The eGFR has been calculated using the CKD EPI equation. This calculation has not been validated in all clinical situations. eGFR's persistently <90 mL/min signify possible Chronic Kidney Disease.    Anion gap 5 5 - 15    Imaging / Studies: No results found.  Medications / Allergies:  Scheduled Meds: . heparin subcutaneous  5,000 Units Subcutaneous 3 times  per day  . loratadine  10 mg Oral Daily  . piperacillin-tazobactam (ZOSYN)  IV  3.375 g Intravenous Q8H  . potassium chloride  40 mEq Oral Once   Continuous Infusions: . dextrose 5% lactated ringers with KCl 20 mEq/L 75 mL/hr at 07/31/14 1101   PRN Meds:.diphenhydrAMINE, morphine injection, ondansetron **OR** ondansetron (ZOFRAN) IV, promethazine, traMADol  Antibiotics: Anti-infectives    Start     Dose/Rate Route Frequency Ordered Stop   07/29/14 0000  piperacillin-tazobactam (ZOSYN) IVPB 3.375 g     3.375 g 12.5 mL/hr over 240 Minutes Intravenous Every 8 hours 07/28/14 2349     07/28/14 2015  cefTRIAXone (ROCEPHIN) 1 g in dextrose 5 % 50 mL IVPB     1 g 100 mL/hr over 30 Minutes Intravenous  Once 07/28/14 2005 07/28/14 2030        Assessment/Plan Perforated appendicitis  POD#4 laparoscopic appendectomy - [name] - 07/28/2014 -continue with fulls until nausea improves.  If no better soon, will obtain AXR -Zosyn -JP drain -pain  control, tramadol -antiemetics -IS -mobilize -SCD/heparin Hypokalemia -supplement, repeat labs in AM Hx lap band - 06/06/2008 - [facility] -bariatric fulls UTI -add culture -zosyn should cover -push POs  Erby Pian, ANP-BC [facility] Pager [phone])  08/01/2014 9:05 AM  Agree with above. Trouble with nausea, though a little better this afternoon. She looks okay.  Alphonsa Overall, MD, [facility] Pager: [phone] Office phone:  [phone]

## 2014-08-01 NOTE — Anesthesia Postprocedure Evaluation (Signed)
  Anesthesia Post-op Note  Patient: Meagan Dean  Procedure(s) Performed: Procedure(s): APPENDECTOMY LAPAROSCOPIC (N/A)  Patient Location: PACU  Anesthesia Type:General  Level of Consciousness: awake and alert   Airway and Oxygen Therapy: Patient Spontanous Breathing  Post-op Pain: mild  Post-op Assessment: Post-op Vital signs reviewed  Post-op Vital Signs: stable  Last Vitals:  Filed Vitals:   08/01/14 0604  BP: 93/63  Pulse: 52  Temp: 36.8 C  Resp: 17    Complications: No apparent anesthesia complications

## 2014-08-01 NOTE — Care Management Note (Unsigned)
    Page 1 of 2   08/02/2014     11:31:32 AM CARE MANAGEMENT NOTE 08/02/2014  Patient:  Meagan Dean, Meagan Dean   Account Number:  [id number]  Date Initiated:  07/29/2014  Documentation initiated by:  DAVIS,RHONDA  Subjective/Objective Assessment:   PERFORATED APPDENIX REQUIRING SURGERY AND POST OP INFECTION CONTROLL     Action/Plan:   home when stable   Anticipated DC Date:  08/02/2014   Anticipated DC Plan:  HOME/SELF CARE  In-house referral  NA      DC Planning Services  CM consult      Choice offered to / List presented to:             Status of service:  In process, will continue to follow Medicare Important Message given?   (If response is "NO", the following Medicare IM given date fields will be blank) Date Medicare IM given:   Medicare IM given by:   Date Additional Medicare IM given:   Additional Medicare IM given by:    Discharge Disposition:    Per UR Regulation:  Reviewed for med. necessity/level of care/duration of stay  If discussed at [place] of Stay Meetings, dates discussed:    Comments:  08/02/14 Cm met with pt in room and gave pt [facility] pamphlet. Pt verbalized understanding she will go to the clinic any weekday morning from 9-10 and ask for: AN APPOINTMENT TO [place]; AN APPOINTMENT WITH AN INSURANCE NAVIGATOR TO SECURE INSURANCE; AN APPOINTMENT FOR MEDICAL CARE.  Pt may need MATCH letter to help defray cost of medication prescribed at discharge. CM will follow.  Mariane Masters, BSN, CM [phone].  August 01, 2014/Rhonda L. Rosana Hoes, RN, BSN, CCM. Case Management [place] [phone] No discharge needs present of time of review. poo po intake advancing diet  July 29, 2014/Rhonda L. Rosana Hoes, RN, BSN, CCM. Case Management [place] [phone] No discharge needs present of time of review.

## 2014-08-02 LAB — BASIC METABOLIC PANEL
Anion gap: 7 (ref 5–15)
BUN: 6 mg/dL (ref 6–23)
CO2: 28 mmol/L (ref 19–32)
CREATININE: 0.85 mg/dL (ref 0.50–1.10)
Calcium: 7.9 mg/dL — ABNORMAL LOW (ref 8.4–10.5)
Chloride: 100 mmol/L (ref 96–112)
GFR calc Af Amer: >90 mL/min (ref >90)
GFR, EST NON AFRICAN AMERICAN: 83 mL/min — AB (ref >90)
Glucose, Bld: 88 mg/dL (ref 70–99)
Potassium: 4 mmol/L (ref 3.5–5.1)
SODIUM: 135 mmol/L (ref 135–145)

## 2014-08-02 LAB — CBC
HEMATOCRIT: 27.7 % — AB (ref 36.0–46.0)
HEMOGLOBIN: 8.9 g/dL — AB (ref 12.0–15.0)
MCH: 27.9 pg (ref 26.0–34.0)
MCHC: 32.1 g/dL (ref 30.0–36.0)
MCV: 86.8 fL (ref 78.0–100.0)
Platelets: 391 10*3/uL (ref 150–400)
RBC: 3.19 MIL/uL — ABNORMAL LOW (ref 3.87–5.11)
RDW: 13.1 % (ref 11.5–15.5)
WBC: 6 10*3/uL (ref 4.0–10.5)

## 2014-08-02 LAB — URINE CULTURE
CULTURE: NO GROWTH
Colony Count: NO GROWTH

## 2014-08-02 MED ORDER — DOCUSATE SODIUM 100 MG PO CAPS: 100.0000 mg | ORAL_CAPSULE | Freq: Two times a day (BID) | ORAL | Status: DC

## 2014-08-02 MED ORDER — METOCLOPRAMIDE HCL 5 MG/ML IJ SOLN: 5.0000 mg | Freq: Four times a day (QID) | INTRAMUSCULAR | Status: DC | PRN

## 2014-08-02 MED ORDER — ONDANSETRON HCL 4 MG/2ML IJ SOLN
4.0000 mg | Freq: Four times a day (QID) | INTRAMUSCULAR | Status: DC
  Administered 2014-08-02 – 2014-08-03 (×4): 4 mg via INTRAVENOUS
  Filled 2014-08-02 (×4): qty 2

## 2014-08-02 MED ORDER — PROMETHAZINE HCL 12.5 MG PO TABS: 12.5000 mg | ORAL_TABLET | Freq: Four times a day (QID) | ORAL | Status: DC | PRN

## 2014-08-02 MED ORDER — BISACODYL 10 MG RE SUPP
10.0000 mg | Freq: Every day | RECTAL | Status: DC
  Administered 2014-08-02: 10 mg via RECTAL
  Filled 2014-08-02 (×2): qty 1

## 2014-08-02 MED ORDER — AMOXICILLIN-POT CLAVULANATE 875-125 MG PO TABS: 1.0000 | ORAL_TABLET | Freq: Two times a day (BID) | ORAL | Status: DC

## 2014-08-02 MED ORDER — ACETAMINOPHEN 500 MG PO TABS
1000.0000 mg | ORAL_TABLET | Freq: Three times a day (TID) | ORAL | Status: DC
  Administered 2014-08-02 – 2014-08-04 (×6): 1000 mg via ORAL
  Filled 2014-08-02 (×10): qty 2

## 2014-08-02 MED ORDER — TRAMADOL HCL 50 MG PO TABS: 50.0000 mg | ORAL_TABLET | Freq: Four times a day (QID) | ORAL | Status: DC | PRN

## 2014-08-02 MED ORDER — HYDROMORPHONE HCL 1 MG/ML IJ SOLN: 0.5000 mg | INTRAMUSCULAR | Status: DC | PRN

## 2014-08-02 MED ORDER — SACCHAROMYCES BOULARDII 250 MG PO CAPS
250.0000 mg | ORAL_CAPSULE | Freq: Two times a day (BID) | ORAL | Status: DC
  Administered 2014-08-02 – 2014-08-04 (×5): 250 mg via ORAL
  Filled 2014-08-02 (×6): qty 1

## 2014-08-02 MED ORDER — ONDANSETRON HCL 8 MG PO TABS: 4.0000 mg | ORAL_TABLET | Freq: Three times a day (TID) | ORAL | Status: DC | PRN

## 2014-08-02 MED ORDER — BISACODYL 10 MG RE SUPP: 10.0000 mg | Freq: Once | RECTAL | Status: DC

## 2014-08-02 NOTE — Progress Notes (Signed)
Patient ID: Meagan Dean, female   DOB: [birth year], 44 y.o.   MRN: [id number]     [facility]      [address] [place], [place] [address]    Phone: [phone] FAX: [phone]     Subjective: Tolerating fulls.  Still some nausea.  No vomiting.  CT without abscess, sbo or ileus.  On po pain meds.  Ambulating.  Afebrile.    Objective:  Vital signs:  Filed Vitals:   08/01/14 0604 08/01/14 1448 08/01/14 2145 08/02/14 0604  BP: '93/63 89/61 86/54 ' 209/67  Pulse: 52 66 62 65  Temp: 98.2 F (36.8 C) 98.4 F (36.9 C) 98.1 F (36.7 C) 98.4 F (36.9 C)  TempSrc: Oral Oral Oral Oral  Resp: '17 16 16 16  ' Height:      Weight:      SpO2: 100% 100% 100% 100%    Last BM Date: 07/28/14  Intake/Output   Yesterday:  03/21 0701 - 03/22 0700 In: 9935 [P.O.:600; I.V.:975; IV Piggyback:150] Out: 493 [Urine:400; Drains:93] This shift:    I/O last 3 completed shifts: In: 2855 [P.O.:720; I.V.:1885; IV Piggyback:250] Out: 46 [Urine:400; Drains:93]    Physical Exam: General: Pt awake/alert/oriented x4 in no acute distress Chest: cta. No chest wall pain w good excursion CV: Pulses intact. Regular rhythm MS: Normal AROM mjr joints. No obvious deformity Abdomen: Soft. Nondistended. Incisions--steri strips in place, dressing was removed, no erythema or drainage. JP drain with serous output. Mildly tender at incisions only. No evidence of peritonitis. No incarcerated hernias. Ext: SCDs BLE. No mjr edema. No cyanosis Skin: No petechiae / purpura   Problem List:   Active Problems:   Perforated appendicitis s/p lap appy 07/28/2014    Results:   Labs: Results for orders placed or performed during the hospital encounter of 07/28/14 (from the past 48 hour(s))  CBC     Status: Abnormal   Collection Time: 08/01/14  4:50 AM  Result Value Ref Range   WBC 6.5 4.0 - 10.5 K/uL   RBC 3.08 (L) 3.87 - 5.11 MIL/uL   Hemoglobin 8.5 (L)  12.0 - 15.0 g/dL   HCT 26.7 (L) 36.0 - 46.0 %   MCV 86.7 78.0 - 100.0 fL   MCH 27.6 26.0 - 34.0 pg   MCHC 31.8 30.0 - 36.0 g/dL   RDW 13.1 11.5 - 15.5 %   Platelets 323 150 - 400 K/uL  Basic metabolic panel     Status: Abnormal   Collection Time: 08/01/14  4:50 AM  Result Value Ref Range   Sodium 135 135 - 145 mmol/L    Comment: DELTA CHECK NOTED REPEATED TO VERIFY    Potassium 3.3 (L) 3.5 - 5.1 mmol/L   Chloride 100 96 - 112 mmol/L   CO2 30 19 - 32 mmol/L   Glucose, Bld 89 70 - 99 mg/dL   BUN 7 6 - 23 mg/dL   Creatinine, Ser 0.80 0.50 - 1.10 mg/dL   Calcium 8.0 (L) 8.4 - 10.5 mg/dL   GFR calc non Af Amer 89 (L) >90 mL/min   GFR calc Af Amer >90 >90 mL/min    Comment: (NOTE) The eGFR has been calculated using the CKD EPI equation. This calculation has not been validated in all clinical situations. eGFR's persistently <90 mL/min signify possible Chronic Kidney Disease.    Anion gap 5 5 - 15  CBC     Status: Abnormal   Collection Time: 08/02/14  4:46 AM  Result Value Ref Range   WBC 6.0 4.0 - 10.5 K/uL   RBC 3.19 (L) 3.87 - 5.11 MIL/uL   Hemoglobin 8.9 (L) 12.0 - 15.0 g/dL   HCT 27.7 (L) 36.0 - 46.0 %   MCV 86.8 78.0 - 100.0 fL   MCH 27.9 26.0 - 34.0 pg   MCHC 32.1 30.0 - 36.0 g/dL   RDW 13.1 11.5 - 15.5 %   Platelets 391 150 - 400 K/uL  Basic metabolic panel     Status: Abnormal   Collection Time: 08/02/14  4:46 AM  Result Value Ref Range   Sodium 135 135 - 145 mmol/L   Potassium 4.0 3.5 - 5.1 mmol/L    Comment: DELTA CHECK NOTED REPEATED TO VERIFY NO VISIBLE HEMOLYSIS    Chloride 100 96 - 112 mmol/L   CO2 28 19 - 32 mmol/L   Glucose, Bld 88 70 - 99 mg/dL   BUN 6 6 - 23 mg/dL   Creatinine, Ser 0.85 0.50 - 1.10 mg/dL   Calcium 7.9 (L) 8.4 - 10.5 mg/dL   GFR calc non Af Amer 83 (L) >90 mL/min   GFR calc Af Amer >90 >90 mL/min    Comment: (NOTE) The eGFR has been calculated using the CKD EPI equation. This calculation has not been validated in all clinical  situations. eGFR's persistently <90 mL/min signify possible Chronic Kidney Disease.    Anion gap 7 5 - 15    Imaging / Studies: Ct Abdomen Pelvis W Contrast  08/01/2014   CLINICAL DATA:  Status post appendectomy. Follow-up for persistent nausea.  EXAM: CT ABDOMEN AND PELVIS WITH CONTRAST  TECHNIQUE: Multidetector CT imaging of the abdomen and pelvis was performed using the standard protocol following bolus administration of intravenous contrast.  CONTRAST:  143m OMNIPAQUE IOHEXOL 300 MG/ML SOLN, 1 OMNIPAQUE IOHEXOL 300 MG/ML SOLN  COMPARISON:  07/28/2014  FINDINGS: There are bilateral small pleural effusions and bibasilar atelectasis.  The liver demonstrates no focal abnormality. There is no intrahepatic or extrahepatic biliary ductal dilatation. Small amount of high density material within the gallbladder likely reflecting gallbladder sludge. The spleen demonstrates no focal abnormality. The kidneys, adrenal glands and pancreas are normal. The bladder is unremarkable.  There is a small hiatal hernia. There is a gastric banding device present. The stomach, duodenum, small intestine, and large intestine demonstrate no contrast extravasation or dilatation. There are mild right lower quadrant inflammatory changes. There is a right lower quadrant drain in place with a small amount of right lower quadrant free fluid which is likely postsurgical. There is no extraluminal contrast. There is no focal fluid collection to suggest an abscess. There is no pneumoperitoneum, pneumatosis, or portal venous gas. There is no abdominal or pelvic free fluid. There is no lymphadenopathy.  The abdominal aorta is normal in caliber.  There are no lytic or sclerotic osseous lesions.  IMPRESSION: 1. No bowel obstruction or ileus. 2. Small amount right lower quadrant free fluid and inflammatory changes consistent with recent appendectomy. No focal fluid collection to suggest an abscess. Right lower quadrant drain in satisfactory  position. 3. Bilateral small pleural effusions with atelectasis.   Electronically Signed   By: HKathreen Devoid  On: 08/01/2014 18:29    Medications / Allergies:  Scheduled Meds: . heparin subcutaneous  5,000 Units Subcutaneous 3 times per day  . loratadine  10 mg Oral Daily  . piperacillin-tazobactam (ZOSYN)  IV  3.375 g Intravenous Q8H   Continuous Infusions: . dextrose  5% lactated ringers with KCl 20 mEq/L 75 mL/hr at 08/01/14 1954   PRN Meds:.diphenhydrAMINE, morphine injection, ondansetron **OR** ondansetron (ZOFRAN) IV, promethazine, traMADol  Antibiotics: Anti-infectives    Start     Dose/Rate Route Frequency Ordered Stop   08/02/14 0000  amoxicillin-clavulanate (AUGMENTIN) 875-125 MG per tablet     1 tablet Oral 2 times daily 08/02/14 0816     07/29/14 0000  piperacillin-tazobactam (ZOSYN) IVPB 3.375 g     3.375 g 12.5 mL/hr over 240 Minutes Intravenous Every 8 hours 07/28/14 2349     07/28/14 2015  cefTRIAXone (ROCEPHIN) 1 g in dextrose 5 % 50 mL IVPB     1 g 100 mL/hr over 30 Minutes Intravenous  Once 07/28/14 2005 07/28/14 2030       Assessment/Plan Perforated appendicitis POD#5 laparoscopic appendectomy - [name] - 07/28/2014 -CT of abdomen and pelvis essentially normal.  Pt vomited with soft diet, may need to decrease.  Give dulcolax x1 now. -JP drain--output is serous, 91m/24.  Will discuss with Dr. GJohney Maineif this should be removed at discharge if output remains >25. -zosyn -mobilize -SCD/heparin Hypokalemia -resolved Hx lap band - 06/06/2008 - [facility] -advance to soft diet UTI -follow culture -push POs  EErby Pian [facility] Pager [phone]([id number]-4:30P)   08/02/2014 8:39 AM

## 2014-08-03 MED ORDER — TRAMADOL HCL 50 MG PO TABS: 50.0000 mg | ORAL_TABLET | Freq: Four times a day (QID) | ORAL | Status: DC | PRN

## 2014-08-03 MED ORDER — MAGIC MOUTHWASH
15.0000 mL | Freq: Four times a day (QID) | ORAL | Status: DC | PRN
  Filled 2014-08-03: qty 15

## 2014-08-03 MED ORDER — ONDANSETRON HCL 4 MG PO TABS
4.0000 mg | ORAL_TABLET | Freq: Four times a day (QID) | ORAL | Status: DC | PRN
Start: 2014-08-03 — End: 2014-08-04

## 2014-08-03 MED ORDER — ALUM & MAG HYDROXIDE-SIMETH 200-200-20 MG/5ML PO SUSP: 30.0000 mL | Freq: Four times a day (QID) | ORAL | Status: DC | PRN

## 2014-08-03 MED ORDER — MENTHOL 3 MG MT LOZG: 1.0000 | LOZENGE | OROMUCOSAL | Status: DC | PRN

## 2014-08-03 MED ORDER — LIP MEDEX EX OINT
1.0000 "application " | TOPICAL_OINTMENT | Freq: Two times a day (BID) | CUTANEOUS | Status: DC
  Administered 2014-08-03 – 2014-08-04 (×2): 1 via TOPICAL
  Filled 2014-08-03 (×2): qty 7

## 2014-08-03 MED ORDER — POLYETHYLENE GLYCOL 3350 17 G PO PACK: 17.0000 g | PACK | Freq: Two times a day (BID) | ORAL | Status: DC | PRN

## 2014-08-03 MED ORDER — SODIUM CHLORIDE 0.9 % IJ SOLN
3.0000 mL | Freq: Two times a day (BID) | INTRAMUSCULAR | Status: DC
  Administered 2014-08-03 – 2014-08-04 (×2): 3 mL via INTRAVENOUS

## 2014-08-03 MED ORDER — LACTATED RINGERS IV BOLUS (SEPSIS): 1000.0000 mL | Freq: Three times a day (TID) | INTRAVENOUS | Status: DC | PRN

## 2014-08-03 MED ORDER — ONDANSETRON 8 MG/NS 50 ML IVPB
8.0000 mg | Freq: Four times a day (QID) | INTRAVENOUS | Status: DC | PRN
  Filled 2014-08-03: qty 8

## 2014-08-03 MED ORDER — BISACODYL 10 MG RE SUPP: 10.0000 mg | Freq: Two times a day (BID) | RECTAL | Status: DC | PRN

## 2014-08-03 MED ORDER — PHENOL 1.4 % MT LIQD
2.0000 | OROMUCOSAL | Status: DC | PRN
  Filled 2014-08-03: qty 177

## 2014-08-03 MED ORDER — HYDROMORPHONE HCL 1 MG/ML IJ SOLN: 0.5000 mg | INTRAMUSCULAR | Status: DC | PRN

## 2014-08-03 MED ORDER — ONDANSETRON HCL 4 MG/2ML IJ SOLN
4.0000 mg | Freq: Four times a day (QID) | INTRAMUSCULAR | Status: DC | PRN
  Filled 2014-08-03: qty 2

## 2014-08-03 MED ORDER — SODIUM CHLORIDE 0.9 % IV SOLN: 250.0000 mL | INTRAVENOUS | Status: DC | PRN

## 2014-08-03 MED ORDER — SODIUM CHLORIDE 0.9 % IJ SOLN: 3.0000 mL | INTRAMUSCULAR | Status: DC | PRN

## 2014-08-03 MED ORDER — NAPROXEN 500 MG PO TABS
500.0000 mg | ORAL_TABLET | Freq: Two times a day (BID) | ORAL | Status: DC | PRN
  Filled 2014-08-03: qty 1

## 2014-08-03 NOTE — Progress Notes (Signed)
Patient ID: Meagan Dean, female   DOB: 05/24/1970, 43 y.o.   MRN: [id number]     [facility]      [address]   [place], [place] [address]    Phone: [phone] FAX: [phone]     Subjective: Intermittent n/v.  Afebrile.  VSS.    Objective:  Vital signs:  Filed Vitals:   08/02/14 0604 08/02/14 1408 08/02/14 2224 08/03/14 0634  BP: 209/67 95/65 101/62 94/57  Pulse: 65 53 68 62  Temp: 98.4 F (36.9 C) 98.6 F (37 C) 98.4 F (36.9 C) 98 F (36.7 C)  TempSrc: Oral Oral Oral Oral  Resp: 16 16 16 16  Height:      Weight:      SpO2: 100% 100% 100% 100%    Last BM Date: 08/02/14  Intake/Output   Yesterday:  03/22 0701 - 03/23 0700 In: 2075 [P.O.:840; I.V.:1085; IV Piggyback:150] Out: 1069 [Urine:800; Emesis/NG output:200; Drains:68; Stool:1] This shift:     Physical Exam: General: Pt awake/alert/oriented x4 in no acute distress Chest: cta. No chest wall pain w good excursion CV: Pulses intact. Regular rhythm MS: Normal AROM mjr joints. No obvious deformity Abdomen: Soft. Nondistended. Incisions--steri strips in place, dressing was removed, no erythema or drainage. JP drain with serous output. Mildly tender at incisions only. No evidence of peritonitis. No incarcerated hernias. Ext: SCDs BLE. No mjr edema. No cyanosis Skin: No petechiae / purpura Problem List:   Active Problems:   Perforated appendicitis s/p lap appy 07/28/2014    Results:   Labs: Results for orders placed or performed during the hospital encounter of 07/28/14 (from the past 48 hour(s))  Culture, Urine     Status: None   Collection Time: 08/01/14  1:20 PM  Result Value Ref Range   Specimen Description URINE, RANDOM    Special Requests NONE    Colony Count NO GROWTH Performed at [facility]     Culture NO GROWTH Performed at [facility]     Report Status 08/02/2014 FINAL   CBC     Status: Abnormal   Collection Time: 08/02/14  4:46 AM  Result Value Ref Range   WBC 6.0 4.0 - 10.5 K/uL   RBC 3.19 (L) 3.87 - 5.11 MIL/uL   Hemoglobin 8.9 (L) 12.0 - 15.0 g/dL   HCT 27.7 (L) 36.0 - 46.0 %   MCV 86.8 78.0 - 100.0 fL   MCH 27.9 26.0 - 34.0 pg   MCHC 32.1 30.0 - 36.0 g/dL   RDW 13.1 11.5 - 15.5 %   Platelets 391 150 - 400 K/uL  Basic metabolic panel     Status: Abnormal   Collection Time: 08/02/14  4:46 AM  Result Value Ref Range   Sodium 135 135 - 145 mmol/L   Potassium 4.0 3.5 - 5.1 mmol/L    Comment: DELTA CHECK NOTED REPEATED TO VERIFY NO VISIBLE HEMOLYSIS    Chloride 100 96 - 112 mmol/L   CO2 28 19 - 32 mmol/L   Glucose, Bld 88 70 - 99 mg/dL   BUN 6 6 - 23 mg/dL   Creatinine, Ser 0.85 0.50 - 1.10 mg/dL   Calcium 7.9 (L) 8.4 - 10.5 mg/dL   GFR calc non Af Amer 83 (L) >90 mL/min   GFR calc Af Amer >90 >90 mL/min    Comment: (NOTE) The eGFR has been calculated using the CKD EPI equation. This calculation has not been validated in all clinical   situations. eGFR's persistently <90 mL/min signify possible Chronic Kidney Disease.    Anion gap 7 5 - 15    Imaging / Studies: Ct Abdomen Pelvis W Contrast  08/01/2014   CLINICAL DATA:  Status post appendectomy. Follow-up for persistent nausea.  EXAM: CT ABDOMEN AND PELVIS WITH CONTRAST  TECHNIQUE: Multidetector CT imaging of the abdomen and pelvis was performed using the standard protocol following bolus administration of intravenous contrast.  CONTRAST:  100mL OMNIPAQUE IOHEXOL 300 MG/ML SOLN, 1 OMNIPAQUE IOHEXOL 300 MG/ML SOLN  COMPARISON:  07/28/2014  FINDINGS: There are bilateral small pleural effusions and bibasilar atelectasis.  The liver demonstrates no focal abnormality. There is no intrahepatic or extrahepatic biliary ductal dilatation. Small amount of high density material within the gallbladder likely reflecting gallbladder sludge. The spleen demonstrates no focal abnormality. The kidneys, adrenal glands and pancreas are normal.  The bladder is unremarkable.  There is a small hiatal hernia. There is a gastric banding device present. The stomach, duodenum, small intestine, and large intestine demonstrate no contrast extravasation or dilatation. There are mild right lower quadrant inflammatory changes. There is a right lower quadrant drain in place with a small amount of right lower quadrant free fluid which is likely postsurgical. There is no extraluminal contrast. There is no focal fluid collection to suggest an abscess. There is no pneumoperitoneum, pneumatosis, or portal venous gas. There is no abdominal or pelvic free fluid. There is no lymphadenopathy.  The abdominal aorta is normal in caliber.  There are no lytic or sclerotic osseous lesions.  IMPRESSION: 1. No bowel obstruction or ileus. 2. Small amount right lower quadrant free fluid and inflammatory changes consistent with recent appendectomy. No focal fluid collection to suggest an abscess. Right lower quadrant drain in satisfactory position. 3. Bilateral small pleural effusions with atelectasis.   Electronically Signed   By: Hetal  Patel   On: 08/01/2014 18:29    Medications / Allergies:  Scheduled Meds: . acetaminophen  1,000 mg Oral TID  . bisacodyl  10 mg Rectal Daily  . heparin subcutaneous  5,000 Units Subcutaneous 3 times per day  . loratadine  10 mg Oral Daily  . ondansetron  4 mg Intravenous 4 times per day  . piperacillin-tazobactam (ZOSYN)  IV  3.375 g Intravenous Q8H  . saccharomyces boulardii  250 mg Oral BID   Continuous Infusions: . dextrose 5% lactated ringers with KCl 20 mEq/L 50 mL/hr at 08/02/14 1132   PRN Meds:.diphenhydrAMINE, HYDROmorphone (DILAUDID) injection, metoCLOPramide (REGLAN) injection, promethazine  Antibiotics: Anti-infectives    Start     Dose/Rate Route Frequency Ordered Stop   08/02/14 0000  amoxicillin-clavulanate (AUGMENTIN) 875-125 MG per tablet     1 tablet Oral 2 times daily 08/02/14 0816     07/29/14 0000   piperacillin-tazobactam (ZOSYN) IVPB 3.375 g     3.375 g 12.5 mL/hr over 240 Minutes Intravenous Every 8 hours 07/28/14 2349     07/28/14 2015  cefTRIAXone (ROCEPHIN) 1 g in dextrose 5 % 50 mL IVPB     1 g 100 mL/hr over 30 Minutes Intravenous  Once 07/28/14 2005 07/28/14 2030        Assessment/Plan Perforated appendicitis POD#6 laparoscopic appendectomy - [name] - 07/28/2014 -having BMs, tolerating PO, ambulating, pain well controlled.  She continues to have nausea, CT of A/P essentially normal.  Likely related to medication, will c/w zofran, florastor, PRN phenergan and reglan.  Hopefully will resolve with some time.  -zosyn D#5 -Drain(68ml serous) will DC at discharge -mobilize -SCD/heparin   Hypokalemia -resolved Hx lap band - 06/06/2008 - [facility] -advance to soft diet UTI -negative urine culture   Emina Riebock, ANP-BC [facility] Pager [phone](7A-4:30P)   08/03/2014 8:05 AM    

## 2014-08-04 LAB — BASIC METABOLIC PANEL
Anion gap: 7 (ref 5–15)
BUN: 11 mg/dL (ref 6–23)
CHLORIDE: 104 mmol/L (ref 96–112)
CO2: 27 mmol/L (ref 19–32)
Calcium: 8.3 mg/dL — ABNORMAL LOW (ref 8.4–10.5)
Creatinine, Ser: 0.9 mg/dL (ref 0.50–1.10)
GFR calc Af Amer: 89 mL/min — ABNORMAL LOW (ref >90)
GFR calc non Af Amer: 77 mL/min — ABNORMAL LOW (ref >90)
GLUCOSE: 84 mg/dL (ref 70–99)
Potassium: 3.6 mmol/L (ref 3.5–5.1)
Sodium: 138 mmol/L (ref 135–145)

## 2014-08-04 LAB — CBC
HEMATOCRIT: 28.9 % — AB (ref 36.0–46.0)
Hemoglobin: 9.3 g/dL — ABNORMAL LOW (ref 12.0–15.0)
MCH: 27.6 pg (ref 26.0–34.0)
MCHC: 32.2 g/dL (ref 30.0–36.0)
MCV: 85.8 fL (ref 78.0–100.0)
Platelets: 428 10*3/uL — ABNORMAL HIGH (ref 150–400)
RBC: 3.37 MIL/uL — AB (ref 3.87–5.11)
RDW: 13.2 % (ref 11.5–15.5)
WBC: 8.6 10*3/uL (ref 4.0–10.5)

## 2014-08-04 MED ORDER — AMOXICILLIN-POT CLAVULANATE 875-125 MG PO TABS: 1.0000 | ORAL_TABLET | Freq: Two times a day (BID) | ORAL | Status: DC

## 2014-08-04 NOTE — Progress Notes (Signed)
RN reviewed discharge instructions with patient and family. All questions answered.   Paperwork (note and AVS) and prescriptions given.   NT rolled patient down to family car.

## 2014-08-04 NOTE — Discharge Summary (Signed)
Physician Discharge Summary  Meagan Dean NWG:[id number] DOB: 02/23/1971 DOA: 07/28/2014  PCP: No PCP Per Patient  Consultation: none  Admit date: 07/28/2014 Discharge date: 08/04/2014  Recommendations for Outpatient Follow-up:   Follow-up Information    Follow up with [name],[name], MD. Schedule an appointment as soon as possible for a visit in 2 weeks.   Specialty:  General Surgery   Contact information:   [address] [place] [address] [phone]      Discharge Diagnoses:  1. Perforated appendicitis 2. Hypokalemia 3. UTI   Surgical Procedure: laparoscopic appendectomy---Dr. Zella Richer   Discharge Condition: stable  Disposition: home  Diet recommendation: regular  Filed Weights   07/28/14 1531  Weight: 65.772 kg (145 lb)    Filed Vitals:   08/04/14 0640  BP: 100/67  Pulse: 70  Temp: 99 F (37.2 C)  Resp: 18      Hospital Course:  [name] Other presented to [facility] with RLQ abdominal pain.  A CT scan was performed demonstrating findings consistent with acute appendicitis.  She was admitted and underwent the procedure listed above.  The patient was found to have perforated appendicits and was admitted for post operative IV antibiotics.  She had an expected ileus which resolved.  She had ongoing nausea and on POD#5 a repeat CT of A/P was essentially normal.  Potassium was supplemented and labs were followed.  She was treated for a UTI, culture was negative.  On POD#5 JP drain was removed.  On POD#6 the patient was stable, tolerating POs, having BMs, nausea had improved and she was afebrile.  She was therefore felt stable for discharge home.  Medication risks, benefits and therapeutic alternatives were reviewed with the patient.  She verbalizes understanding.  She knows to follow up in 2-3 weeks.  Encouraged to call with questions or concerns.    Discharge Instructions     Medication List    TAKE these medications        acetaminophen 325 MG  tablet  Commonly known as:  TYLENOL  Take 650 mg by mouth every 6 (six) hours as needed (pain.).     amoxicillin-clavulanate 875-125 MG per tablet  Commonly known as:  AUGMENTIN  Take 1 tablet by mouth 2 (two) times daily.     cetirizine 10 MG tablet  Commonly known as:  ZYRTEC  Take 10 mg by mouth daily.     ondansetron 8 MG tablet  Commonly known as:  ZOFRAN  Take 0.5 tablets (4 mg total) by mouth every 8 (eight) hours as needed for nausea.     potassium chloride 10 MEQ tablet  Commonly known as:  K-DUR  Take 1 tablet (10 mEq total) by mouth 2 (two) times daily.     promethazine 12.5 MG tablet  Commonly known as:  PHENERGAN  Take 1-2 tablets (12.5-25 mg total) by mouth every 6 (six) hours as needed for nausea.     pseudoephedrine 30 MG tablet  Commonly known as:  SUDAFED  Take 30 mg by mouth every 4 (four) hours as needed for congestion.     sulfamethoxazole-trimethoprim 800-160 MG per tablet  Commonly known as:  BACTRIM DS  Take 1 tablet by mouth 2 (two) times daily.     traMADol 50 MG tablet  Commonly known as:  ULTRAM  Take 1 tablet (50 mg total) by mouth every 8 (eight) hours as needed for moderate pain (Flank pain).     traMADol 50 MG tablet  Commonly known  as:  ULTRAM  Take 1-2 tablets (50-100 mg total) by mouth every 6 (six) hours as needed for moderate pain.           Follow-up Information    Follow up with [name],[name], MD. Schedule an appointment as soon as possible for a visit in 2 weeks.   Specialty:  General Surgery   Contact information:   [place]  [address] [phone]        The results of significant diagnostics from this hospitalization (including imaging, microbiology, ancillary and laboratory) are listed below for reference.    Significant Diagnostic Studies: Ct Abdomen Pelvis W Contrast  08/01/2014   CLINICAL DATA:  Status post appendectomy. Follow-up for persistent nausea.  EXAM: CT ABDOMEN AND PELVIS  WITH CONTRAST  TECHNIQUE: Multidetector CT imaging of the abdomen and pelvis was performed using the standard protocol following bolus administration of intravenous contrast.  CONTRAST:  19mL OMNIPAQUE IOHEXOL 300 MG/ML SOLN, 1 OMNIPAQUE IOHEXOL 300 MG/ML SOLN  COMPARISON:  07/28/2014  FINDINGS: There are bilateral small pleural effusions and bibasilar atelectasis.  The liver demonstrates no focal abnormality. There is no intrahepatic or extrahepatic biliary ductal dilatation. Small amount of high density material within the gallbladder likely reflecting gallbladder sludge. The spleen demonstrates no focal abnormality. The kidneys, adrenal glands and pancreas are normal. The bladder is unremarkable.  There is a small hiatal hernia. There is a gastric banding device present. The stomach, duodenum, small intestine, and large intestine demonstrate no contrast extravasation or dilatation. There are mild right lower quadrant inflammatory changes. There is a right lower quadrant drain in place with a small amount of right lower quadrant free fluid which is likely postsurgical. There is no extraluminal contrast. There is no focal fluid collection to suggest an abscess. There is no pneumoperitoneum, pneumatosis, or portal venous gas. There is no abdominal or pelvic free fluid. There is no lymphadenopathy.  The abdominal aorta is normal in caliber.  There are no lytic or sclerotic osseous lesions.  IMPRESSION: 1. No bowel obstruction or ileus. 2. Small amount right lower quadrant free fluid and inflammatory changes consistent with recent appendectomy. No focal fluid collection to suggest an abscess. Right lower quadrant drain in satisfactory position. 3. Bilateral small pleural effusions with atelectasis.   Electronically Signed   By: Kathreen Devoid   On: 08/01/2014 18:29   Ct Abdomen Pelvis W Contrast  07/28/2014   CLINICAL DATA:  Right lower quadrant abdominal pain for the past 4 days. Fever. Clinical concern for  appendicitis.  EXAM: CT ABDOMEN AND PELVIS WITH CONTRAST  TECHNIQUE: Multidetector CT imaging of the abdomen and pelvis was performed using the standard protocol following bolus administration of intravenous contrast.  CONTRAST:  16mL OMNIPAQUE IOHEXOL 300 MG/ML  SOLN  COMPARISON:  06/16/2013  FINDINGS: A gastric band and associated tubing is again demonstrated. Small sliding hiatal hernia with mild diffuse wall thickening. Small umbilical hernia containing fat. The appendix is diffusely enlarged with diffuse wall thickening and mucosal enhancement. There is a small proximal appendicolith. Periappendiceal soft tissue stranding is noted. No fluid collections are seen. No free peritoneal air. The appendix is located in the mid right pelvis. It has a maximum diameter of 13.1 mm.  No small or large bowel abnormalities are seen. No enlarged lymph nodes. Multiple uterine masses.  Normal appearing ovaries, urinary bladder, kidneys, adrenal glands, gallbladder, pancreas, liver and spleen. Interval minimal subpleural atelectasis at the right lung base. Mild lumbar and lower  thoracic spine degenerative changes.  IMPRESSION: 1. Acute appendicitis without abscess. 2. Small sliding hiatal hernia with mild diffuse wall thickening. The wall thickening may be due to gastritis involving the hiatal hernia. 3. Stable gastric band. 4. Small umbilical hernia containing fat. These results were called by telephone at the time of interpretation on 07/28/2014 at 7:15 pm to Dr. Evelina Bucy , who verbally acknowledged these results.   Electronically Signed   By: Claudie Revering M.D.   On: 07/28/2014 19:17    Microbiology: Recent Results (from the past 240 hour(s))  Wet prep, genital     Status: Abnormal   Collection Time: 07/28/14  4:45 PM  Result Value Ref Range Status   Yeast Wet Prep HPF POC NONE SEEN NONE SEEN Final   Trich, Wet Prep NONE SEEN NONE SEEN Final   Clue Cells Wet Prep HPF POC NONE SEEN NONE SEEN Final   WBC, Wet Prep  HPF POC RARE (A) NONE SEEN Final  Culture, Urine     Status: None   Collection Time: 08/01/14  1:20 PM  Result Value Ref Range Status   Specimen Description URINE, RANDOM  Final   Special Requests NONE  Final   Colony Count NO GROWTH Performed at [facility]   Final   Culture NO GROWTH Performed at [facility]   Final   Report Status 08/02/2014 FINAL  Final     Labs: Basic Metabolic Panel:  Recent Labs Lab 07/29/14 0510 07/30/14 0533 08/01/14 0450 08/02/14 0446 08/04/14 0435  NA 139 142 135 135 138  K 3.5 3.0* 3.3* 4.0 3.6  CL 101 103 100 100 104  CO2 28 28 30 28 27   GLUCOSE 136* 88 89 88 84  BUN 8 10 7 6 11   CREATININE 0.68 0.72 0.80 0.85 0.90  CALCIUM 8.2* 8.2* 8.0* 7.9* 8.3*   Liver Function Tests:  Recent Labs Lab 07/28/14 1301  AST 12  ALT 8  ALKPHOS 49  BILITOT 0.7  PROT 7.2  ALBUMIN 3.6    Recent Labs Lab 07/28/14 1301  LIPASE 25   No results for input(s): AMMONIA in the last 168 hours. CBC:  Recent Labs Lab 07/28/14 1301 07/29/14 0510 07/30/14 0533 08/01/14 0450 08/02/14 0446 08/04/14 0435  WBC 7.8 13.2* 9.5 6.5 6.0 8.6  NEUTROABS 5.2  --   --   --   --   --   HGB 11.0* 10.7* 9.2* 8.5* 8.9* 9.3*  HCT 34.0* 32.2* 28.0* 26.7* 27.7* 28.9*  MCV 85.6 85.9 87.0 86.7 86.8 85.8  PLT 386 357 299 323 391 428*   Cardiac Enzymes: No results for input(s): CKTOTAL, CKMB, CKMBINDEX, TROPONINI in the last 168 hours. BNP: BNP (last 3 results) No results for input(s): BNP in the last 8760 hours.  ProBNP (last 3 results) No results for input(s): PROBNP in the last 8760 hours.  CBG: No results for input(s): GLUCAP in the last 168 hours.  Active Problems:   Perforated appendicitis s/p lap appy 07/28/2014   Time coordinating discharge: <30 mins  Signed:  Claryce Friel, ANP-BC

## 2015-04-03 ENCOUNTER — Ambulatory Visit (INDEPENDENT_AMBULATORY_CARE_PROVIDER_SITE_OTHER): Payer: Self-pay | Admitting: Internal Medicine

## 2015-04-03 ENCOUNTER — Encounter: Payer: Self-pay | Admitting: Internal Medicine

## 2015-04-03 VITALS — BP 120/81 | HR 72 | Temp 98.0°F | Ht 63.5 in | Wt 153.0 lb

## 2015-04-03 DIAGNOSIS — Z0000 Encounter for general adult medical examination without abnormal findings: Secondary | ICD-10-CM

## 2015-04-03 DIAGNOSIS — N3000 Acute cystitis without hematuria: Secondary | ICD-10-CM

## 2015-04-03 DIAGNOSIS — N390 Urinary tract infection, site not specified: Secondary | ICD-10-CM | POA: Insufficient documentation

## 2015-04-03 DIAGNOSIS — Z113 Encounter for screening for infections with a predominantly sexual mode of transmission: Secondary | ICD-10-CM | POA: Insufficient documentation

## 2015-04-03 DIAGNOSIS — R300 Dysuria: Secondary | ICD-10-CM

## 2015-04-03 DIAGNOSIS — E876 Hypokalemia: Secondary | ICD-10-CM

## 2015-04-03 MED ORDER — SULFAMETHOXAZOLE-TRIMETHOPRIM 800-160 MG PO TABS: 1.0000 | ORAL_TABLET | Freq: Two times a day (BID) | ORAL | Status: DC

## 2015-04-03 NOTE — Assessment & Plan Note (Signed)
Pt concerned her potassium is low because she has occasional cramps. Has h/o hypokalemia from previous admits. -BMP today

## 2015-04-03 NOTE — Assessment & Plan Note (Signed)
Pt reports increased frequency and odor x 2 weeks. Same sx at beginning of previous UTIs per patient. Denies other symptoms at this time. -UA today -Bactrim DS x 3 days

## 2015-04-03 NOTE — Assessment & Plan Note (Signed)
Overall, pt is doing well. No new issues today. -Pt declined flu shot today

## 2015-04-03 NOTE — Progress Notes (Signed)
   Patient ID: Meagan Dean female   DOB: 07/12/1970 44 y.o.   MRN: [id number]  Subjective:   HPI: [name] is a 44 y.o. with PMH of fibroids, endometriosis who presents to [facility] today for employment physical. She says her only 2 complaints are increased urinary frequency and a 'bad smell' to her urine without dysuria, hematuria, CVA tenderness, urgency, or fever for the last 2 weeks. She has previously had uncomplicated UTIs treated with bactrim. She also notes occasional cramps in her legs and has a h/o hypokalemia but otherwise denies fever, weight changes, heat or cold intolerance, chest pain, palpitations, shortness of breath, nausea, vomiting, changes in bowel function, weakness, numbness, rashes, or any other symptoms.   Please see problem-based charting for status of medical issues pertinent to this visit.  Review of Systems: Pertinent items noted in HPI and remainder of comprehensive ROS otherwise negative.  Objective:  Physical Exam: Filed Vitals:   04/03/15 1056  BP: 120/81  Pulse: 72  Temp: 98 F (36.7 C)  TempSrc: Oral  Height: 5' 3.5" (1.613 m)  Weight: 153 lb (69.4 kg)  SpO2: 100%   Gen: Well-appearing, alert and oriented to person, place, and time HEENT: Oropharynx clear without erythema or exudate.  Neck: No cervical LAD, no thyromegaly or nodules, no JVD noted. CV: Normal rate, regular rhythm, no murmurs, rubs, or gallops Pulmonary: Normal effort, CTA bilaterally, no wheezing, rales, or rhonchi Abdominal: Soft, non-tender, non-distended, without rebound, guarding, or masses. No suprapubic tenderness noted. Extremities: Distal pulses 2+ in upper and lower extremities bilaterally, no tenderness, erythema or edema Skin: No atypical appearing moles. No rashes  Assessment & Plan:  Please see problem-based charting for assessment and plan.  Meagan Ohara, MD Resident Physician, PGY-1 Department of Internal Medicine [facility]

## 2015-04-04 ENCOUNTER — Telehealth: Payer: Self-pay | Admitting: Internal Medicine

## 2015-04-04 DIAGNOSIS — N3000 Acute cystitis without hematuria: Secondary | ICD-10-CM

## 2015-04-04 LAB — MICROSCOPIC EXAMINATION
CASTS: NONE SEEN /LPF
RBC, UA: >30 /hpf — AB (ref 0–?)

## 2015-04-04 LAB — URINALYSIS, ROUTINE W REFLEX MICROSCOPIC
BILIRUBIN UA: NEGATIVE
GLUCOSE, UA: NEGATIVE
KETONES UA: NEGATIVE
Nitrite, UA: NEGATIVE
Specific Gravity, UA: >=1.03 — AB (ref 1.005–1.030)
Urobilinogen, Ur: 0.2 mg/dL (ref 0.2–1.0)
pH, UA: 6 (ref 5.0–7.5)

## 2015-04-04 LAB — BMP8+ANION GAP
Anion Gap: 22 mmol/L — ABNORMAL HIGH (ref 10.0–18.0)
BUN / CREAT RATIO: 21 (ref 9–23)
BUN: 15 mg/dL (ref 6–24)
CO2: 23 mmol/L (ref 18–29)
CREATININE: 0.72 mg/dL (ref 0.57–1.00)
Calcium: 7.5 mg/dL — ABNORMAL LOW (ref 8.7–10.2)
Chloride: 102 mmol/L (ref 97–106)
GFR calc Af Amer: 119 mL/min/{1.73_m2} (ref >59)
GFR, EST NON AFRICAN AMERICAN: 103 mL/min/{1.73_m2} (ref >59)
Glucose: 77 mg/dL (ref 65–99)
Potassium: 3.2 mmol/L — ABNORMAL LOW (ref 3.5–5.2)
SODIUM: 147 mmol/L — AB (ref 136–144)

## 2015-04-04 MED ORDER — POTASSIUM CHLORIDE ER 20 MEQ PO TBCR: 20.0000 meq | EXTENDED_RELEASE_TABLET | Freq: Every day | ORAL | Status: DC

## 2015-04-04 NOTE — Addendum Note (Signed)
Addended by: Marcelino Duster on: 04/04/2015 03:33 PM   Modules accepted: Orders

## 2015-04-04 NOTE — Telephone Encounter (Signed)
  Reason for call:   I placed an outgoing call to Ms. Shon Millet Deloach at 3:15  PM regarding her UA results and BMP results. She does appear to be having a UTI and confirmed that she was going to pick up her 3 day course of TMP-SMX. She also had a mild hypokalemia to 3.2. Given with a goal of 4.2 and Cr of 0.7, total replacement should be roughly 180mEq. (4.2-3.2)/0.7 x 100. She agrees with this plan and has no other concerns at this time.   Assessment/ Plan:   Will add on UCx for speciation and sensitivties  KCl tablets PO 78mEq QD x 7 days  Will re-check BMP at next visit  As always, pt is advised that if symptoms worsen or new symptoms arise, they should go to an urgent care facility or to to ER for further evaluation.   Norval Gable, MD   04/04/2015, 3:22 PM

## 2015-04-04 NOTE — Addendum Note (Signed)
Addended by: Truddie Crumble on: 04/04/2015 03:42 PM   Modules accepted: Orders

## 2015-04-04 NOTE — Progress Notes (Signed)
Internal Medicine Clinic Attending  Case discussed with Dr. Kennedy at the time of the visit.  We reviewed the resident's history and exam and pertinent patient test results.  I agree with the assessment, diagnosis, and plan of care documented in the resident's note.  

## 2015-04-05 LAB — SPECIMEN STATUS REPORT

## 2015-04-07 LAB — URINE CULTURE

## 2015-05-14 HISTORY — PX: OTHER SURGICAL HISTORY: SHX169

## 2015-06-27 ENCOUNTER — Encounter: Payer: Self-pay | Admitting: Internal Medicine

## 2015-06-27 ENCOUNTER — Ambulatory Visit (INDEPENDENT_AMBULATORY_CARE_PROVIDER_SITE_OTHER): Payer: Self-pay | Admitting: Internal Medicine

## 2015-06-27 VITALS — BP 124/78 | HR 76 | Temp 97.5°F | Ht 62.0 in | Wt 154.6 lb

## 2015-06-27 DIAGNOSIS — Z0000 Encounter for general adult medical examination without abnormal findings: Secondary | ICD-10-CM | POA: Insufficient documentation

## 2015-06-27 DIAGNOSIS — Z113 Encounter for screening for infections with a predominantly sexual mode of transmission: Secondary | ICD-10-CM

## 2015-06-27 DIAGNOSIS — E876 Hypokalemia: Secondary | ICD-10-CM

## 2015-06-27 NOTE — Patient Instructions (Signed)
General Instructions:  I will call you with the results  Please bring your medicines with you each time you come to clinic.  Medicines may include prescription medications, over-the-counter medications, herbal remedies, eye drops, vitamins, or other pills.   Progress Toward Treatment Goals:  No flowsheet data found.  Self Care Goals & Plans:  No flowsheet data found.  No flowsheet data found.   Care Management & Community Referrals:  No flowsheet data found.

## 2015-06-28 LAB — CMP14 + ANION GAP
ALBUMIN: 4.1 g/dL (ref 3.5–5.5)
ALT: 7 IU/L (ref 0–32)
AST: 12 IU/L (ref 0–40)
Albumin/Globulin Ratio: 1.3 (ref 1.1–2.5)
Alkaline Phosphatase: 59 IU/L (ref 39–117)
Anion Gap: 16 mmol/L (ref 10.0–18.0)
BUN / CREAT RATIO: 17 (ref 9–23)
BUN: 13 mg/dL (ref 6–24)
Bilirubin Total: 0.3 mg/dL (ref 0.0–1.2)
CO2: 24 mmol/L (ref 18–29)
CREATININE: 0.76 mg/dL (ref 0.57–1.00)
Calcium: 8.9 mg/dL (ref 8.7–10.2)
Chloride: 100 mmol/L (ref 96–106)
GFR calc non Af Amer: 96 mL/min/{1.73_m2} (ref >59)
GFR, EST AFRICAN AMERICAN: 110 mL/min/{1.73_m2} (ref >59)
GLOBULIN, TOTAL: 3.1 g/dL (ref 1.5–4.5)
Glucose: 73 mg/dL (ref 65–99)
Potassium: 2.9 mmol/L — ABNORMAL LOW (ref 3.5–5.2)
SODIUM: 140 mmol/L (ref 134–144)
TOTAL PROTEIN: 7.2 g/dL (ref 6.0–8.5)

## 2015-06-28 LAB — URINE CYTOLOGY ANCILLARY ONLY
CHLAMYDIA, DNA PROBE: NEGATIVE
Neisseria Gonorrhea: NEGATIVE

## 2015-06-28 LAB — HEPATITIS B SURFACE ANTIBODY,QUALITATIVE: Hep B Surface Ab, Qual: NONREACTIVE

## 2015-06-28 LAB — HIV ANTIBODY (ROUTINE TESTING W REFLEX): HIV SCREEN 4TH GENERATION: NONREACTIVE

## 2015-06-28 LAB — HEPATITIS B SURFACE ANTIGEN: Hepatitis B Surface Ag: NEGATIVE

## 2015-06-28 LAB — HEPATITIS B CORE ANTIBODY, TOTAL: Hep B Core Total Ab: NEGATIVE

## 2015-06-28 LAB — RPR: RPR: NONREACTIVE

## 2015-06-28 LAB — HEPATITIS C ANTIBODY: Hep C Virus Ab: <0.1 s/co ratio (ref 0.0–0.9)

## 2015-06-28 MED ORDER — POTASSIUM CHLORIDE ER 20 MEQ PO TBCR: 20.0000 meq | EXTENDED_RELEASE_TABLET | Freq: Every day | ORAL | Status: DC

## 2015-06-28 NOTE — Assessment & Plan Note (Signed)
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  Will restart her supplementation 32mEq per day, follow up in 1-2 weeks for repeat BMP and discuss causes of low potassium

## 2015-06-28 NOTE — Progress Notes (Signed)
[facility] Subjective:   Patient ID: Meagan Dean female   DOB: 06-09-1970 45 y.o.   MRN: [id number]  HPI: [name] is a 45 y.o. female with a PMH detailed below who presents for STD screening.  She reports she was in a relationship and having regular unprotected sex with a female partner for some time.  This relationship ended suddently about a month ago and she just found out that he has been diagnosed with some form of viral hepatitis.  She has felt overall well and is having no fevers/ chills, dysruia or vaginal discharge.  She does report some mild muscle cramping and wonders if her potassium is low again.    Past Medical History  Diagnosis Date  . Right ureteral stone   . Renal calculus, left     NON-OBSTRUCTING  . History of hypertension     NO ISSUES SINCE WT LOSS AFTER GASTRIC BANDING  . Asthma due to seasonal allergies     MILD--  NO INHALER  . H/O hiatal hernia   . GERD (gastroesophageal reflux disease)   . Urgency of urination   . Endometriosis of pelvis   . History of obstructive sleep apnea     DX'D PRIOR TO GASTRIC BANDING  2010 --  NO ISSUES SINCE WT LOSS   Current Outpatient Prescriptions  Medication Sig Dispense Refill  . Potassium Chloride ER 20 MEQ TBCR Take 20 mEq by mouth daily. 30 tablet 0  . pseudoephedrine (SUDAFED) 30 MG tablet Take 30 mg by mouth every 4 (four) hours as needed for congestion.     No current facility-administered medications for this visit.   No family history on file. Social History   Social History  . Marital Status: Single    Spouse Name: N/A  . Number of Children: N/A  . Years of Education: N/A   Social History Main Topics  . Smoking status: Never Smoker   . Smokeless tobacco: Never Used  . Alcohol Use: 0.0 oz/week    0 Standard drinks or equivalent per week     Comment: occasionally  . Drug Use: No  . Sexual Activity: Not Asked   Other Topics Concern  . None   Social History  Narrative   Review of Systems: Review of Systems  Constitutional: Negative for fever, chills and malaise/fatigue.  Respiratory: Negative for cough.   Cardiovascular: Negative for chest pain.  Neurological: Negative for dizziness.     Objective:  Physical Exam: Filed Vitals:   06/27/15 1546  BP: 124/78  Pulse: 76  Temp: 97.5 F (36.4 C)  TempSrc: Oral  Height: 5\' 2"  (1.575 m)  Weight: 154 lb 9.6 oz (70.126 kg)  SpO2: 100%  Physical Exam  Constitutional: She is well-developed, well-nourished, and in no distress.  Abdominal: Soft. Bowel sounds are normal. There is no tenderness.    Assessment & Plan:  Case discussed with Dr. Evette Doffing  Screen for STD (sexually transmitted disease) - Screening obtained for Hep B, C, HIV, RPR, GC/C.  - Initial results are negative (still awaiting Urine GC/C) I discussed this with the patient, she also does not have immunity for Hep B and will return in 1-2 weeks to discuss vaccination.  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  Will restart her supplementation 35mEq per day, follow up in 1-2 weeks for repeat BMP and discuss causes of low potassium    Medications Ordered Meds ordered this encounter  Medications  . Potassium Chloride ER 20 MEQ TBCR    Sig: Take 20 mEq by mouth daily.    Dispense:  30 tablet    Refill:  0   Other Orders Orders Placed This Encounter  Procedures  . CMP14 + Anion Gap  . Hepatitis B Surface Antibody  . Hepatitis B Surface Antigen  . Hepatitis B core Ab, Total  . HIV antibody (with reflex)  . Hepatitis C antibody  . RPR   Follow Up: Return if symptoms worsen or fail to improve.

## 2015-06-28 NOTE — Assessment & Plan Note (Signed)
-   Screening obtained for Hep B, C, HIV, RPR, GC/C.  - Initial results are negative (still awaiting Urine GC/C) I discussed this with the patient, she also does not have immunity for Hep B and will return in 1-2 weeks to discuss vaccination.

## 2015-06-29 NOTE — Progress Notes (Signed)
Internal Medicine Clinic Attending  Case discussed with Dr. Hoffman at the time of the visit.  We reviewed the resident's history and exam and pertinent patient test results.  I agree with the assessment, diagnosis, and plan of care documented in the resident's note.  

## 2015-11-17 ENCOUNTER — Emergency Department (HOSPITAL_COMMUNITY): Payer: Self-pay

## 2015-11-17 ENCOUNTER — Observation Stay (HOSPITAL_COMMUNITY): Payer: MEDICAID

## 2015-11-17 ENCOUNTER — Inpatient Hospital Stay (HOSPITAL_COMMUNITY)
Admission: EM | Admit: 2015-11-17 | Discharge: 2015-12-01 | DRG: 337 | Disposition: A | Discharge status: Home / Self Care | Payer: Self-pay | Attending: Internal Medicine | Admitting: Internal Medicine

## 2015-11-17 ENCOUNTER — Encounter (HOSPITAL_COMMUNITY): Payer: Self-pay | Admitting: Emergency Medicine

## 2015-11-17 DIAGNOSIS — K3532 Acute appendicitis with perforation and localized peritonitis, without abscess: Secondary | ICD-10-CM | POA: Diagnosis present

## 2015-11-17 DIAGNOSIS — E162 Hypoglycemia, unspecified: Secondary | ICD-10-CM | POA: Diagnosis not present

## 2015-11-17 DIAGNOSIS — K219 Gastro-esophageal reflux disease without esophagitis: Secondary | ICD-10-CM | POA: Diagnosis present

## 2015-11-17 DIAGNOSIS — Z6830 Body mass index (BMI) 30.0-30.9, adult: Secondary | ICD-10-CM

## 2015-11-17 DIAGNOSIS — K567 Ileus, unspecified: Secondary | ICD-10-CM | POA: Diagnosis not present

## 2015-11-17 DIAGNOSIS — E876 Hypokalemia: Secondary | ICD-10-CM

## 2015-11-17 DIAGNOSIS — J45909 Unspecified asthma, uncomplicated: Secondary | ICD-10-CM | POA: Diagnosis present

## 2015-11-17 DIAGNOSIS — K565 Intestinal adhesions [bands] with obstruction (postprocedural) (postinfection): Principal | ICD-10-CM | POA: Diagnosis present

## 2015-11-17 DIAGNOSIS — Z9884 Bariatric surgery status: Secondary | ICD-10-CM | POA: Insufficient documentation

## 2015-11-17 DIAGNOSIS — D649 Anemia, unspecified: Secondary | ICD-10-CM | POA: Diagnosis present

## 2015-11-17 DIAGNOSIS — R739 Hyperglycemia, unspecified: Secondary | ICD-10-CM

## 2015-11-17 DIAGNOSIS — Z8669 Personal history of other diseases of the nervous system and sense organs: Secondary | ICD-10-CM | POA: Diagnosis present

## 2015-11-17 DIAGNOSIS — K529 Noninfective gastroenteritis and colitis, unspecified: Secondary | ICD-10-CM | POA: Insufficient documentation

## 2015-11-17 DIAGNOSIS — Z885 Allergy status to narcotic agent status: Secondary | ICD-10-CM

## 2015-11-17 DIAGNOSIS — R109 Unspecified abdominal pain: Secondary | ICD-10-CM

## 2015-11-17 DIAGNOSIS — K56600 Partial intestinal obstruction, unspecified as to cause: Secondary | ICD-10-CM | POA: Diagnosis present

## 2015-11-17 DIAGNOSIS — K56609 Unspecified intestinal obstruction, unspecified as to partial versus complete obstruction: Secondary | ICD-10-CM | POA: Diagnosis present

## 2015-11-17 DIAGNOSIS — K5669 Other intestinal obstruction: Secondary | ICD-10-CM

## 2015-11-17 DIAGNOSIS — E669 Obesity, unspecified: Secondary | ICD-10-CM | POA: Diagnosis present

## 2015-11-17 DIAGNOSIS — I10 Essential (primary) hypertension: Secondary | ICD-10-CM | POA: Diagnosis present

## 2015-11-17 LAB — COMPREHENSIVE METABOLIC PANEL
ALBUMIN: 3.4 g/dL — AB (ref 3.5–5.0)
ALK PHOS: 49 U/L (ref 38–126)
ALT: 10 U/L — AB (ref 14–54)
ANION GAP: 8 (ref 5–15)
AST: 20 U/L (ref 15–41)
BUN: 12 mg/dL (ref 6–20)
CALCIUM: 8.3 mg/dL — AB (ref 8.9–10.3)
CO2: 21 mmol/L — AB (ref 22–32)
Chloride: 108 mmol/L (ref 101–111)
Creatinine, Ser: 0.78 mg/dL (ref 0.44–1.00)
GFR calc Af Amer: >60 mL/min (ref >60)
GFR calc non Af Amer: >60 mL/min (ref >60)
GLUCOSE: 235 mg/dL — AB (ref 65–99)
Potassium: 2.5 mmol/L — CL (ref 3.5–5.1)
SODIUM: 137 mmol/L (ref 135–145)
Total Bilirubin: 0.3 mg/dL (ref 0.3–1.2)
Total Protein: 6.8 g/dL (ref 6.5–8.1)

## 2015-11-17 LAB — LIPASE, BLOOD: Lipase: 47 U/L (ref 11–51)

## 2015-11-17 LAB — CBC WITH DIFFERENTIAL/PLATELET
Basophils Absolute: 0 10*3/uL (ref 0.0–0.1)
Basophils Relative: 0 %
EOS PCT: 1 %
Eosinophils Absolute: 0.1 10*3/uL (ref 0.0–0.7)
HCT: 33.9 % — ABNORMAL LOW (ref 36.0–46.0)
HEMOGLOBIN: 11.1 g/dL — AB (ref 12.0–15.0)
LYMPHS ABS: 2.9 10*3/uL (ref 0.7–4.0)
LYMPHS PCT: 32 %
MCH: 27.9 pg (ref 26.0–34.0)
MCHC: 32.7 g/dL (ref 30.0–36.0)
MCV: 85.2 fL (ref 78.0–100.0)
MONO ABS: 0.4 10*3/uL (ref 0.1–1.0)
MONOS PCT: 5 %
NEUTROS ABS: 5.8 10*3/uL (ref 1.7–7.7)
Neutrophils Relative %: 62 %
Platelets: 409 10*3/uL — ABNORMAL HIGH (ref 150–400)
RBC: 3.98 MIL/uL (ref 3.87–5.11)
RDW: 13.7 % (ref 11.5–15.5)
WBC: 9.2 10*3/uL (ref 4.0–10.5)

## 2015-11-17 MED ORDER — DEXTROSE-NACL 5-0.45 % IV SOLN
INTRAVENOUS | Status: DC
  Administered 2015-11-17: 12:00:00 via INTRAVENOUS

## 2015-11-17 MED ORDER — POTASSIUM CHLORIDE 10 MEQ/100ML IV SOLN
10.0000 meq | INTRAVENOUS | Status: AC
  Administered 2015-11-17 (×2): 10 meq via INTRAVENOUS
  Filled 2015-11-17 (×2): qty 100

## 2015-11-17 MED ORDER — SODIUM CHLORIDE 0.9 % IV BOLUS (SEPSIS)
1000.0000 mL | Freq: Once | INTRAVENOUS | Status: AC
  Administered 2015-11-17: 1000 mL via INTRAVENOUS

## 2015-11-17 MED ORDER — ONDANSETRON HCL 4 MG/2ML IJ SOLN
4.0000 mg | Freq: Once | INTRAMUSCULAR | Status: AC
  Administered 2015-11-17: 4 mg via INTRAVENOUS
  Filled 2015-11-17: qty 2

## 2015-11-17 MED ORDER — DICYCLOMINE HCL 10 MG PO CAPS
10.0000 mg | ORAL_CAPSULE | Freq: Once | ORAL | Status: AC
  Administered 2015-11-17: 10 mg via ORAL
  Filled 2015-11-17: qty 1

## 2015-11-17 MED ORDER — MORPHINE SULFATE (PF) 2 MG/ML IV SOLN
2.0000 mg | INTRAVENOUS | Status: DC | PRN
  Administered 2015-11-17 – 2015-11-24 (×22): 2 mg via INTRAVENOUS
  Filled 2015-11-17 (×22): qty 1

## 2015-11-17 MED ORDER — POTASSIUM CHLORIDE IN NACL 40-0.9 MEQ/L-% IV SOLN
INTRAVENOUS | Status: AC
  Administered 2015-11-17: 125 mL/h via INTRAVENOUS
  Filled 2015-11-17 (×2): qty 1000

## 2015-11-17 MED ORDER — ONDANSETRON HCL 4 MG PO TABS: 4.0000 mg | ORAL_TABLET | Freq: Four times a day (QID) | ORAL | Status: DC | PRN

## 2015-11-17 MED ORDER — LOPERAMIDE HCL 2 MG PO CAPS
4.0000 mg | ORAL_CAPSULE | Freq: Once | ORAL | Status: AC
  Administered 2015-11-17: 4 mg via ORAL
  Filled 2015-11-17: qty 2

## 2015-11-17 MED ORDER — HYDROMORPHONE HCL 1 MG/ML IJ SOLN
1.0000 mg | Freq: Once | INTRAMUSCULAR | Status: AC
  Administered 2015-11-17: 1 mg via INTRAVENOUS
  Filled 2015-11-17: qty 1

## 2015-11-17 MED ORDER — MORPHINE SULFATE (PF) 4 MG/ML IV SOLN
4.0000 mg | Freq: Once | INTRAVENOUS | Status: AC
  Administered 2015-11-17: 4 mg via INTRAVENOUS
  Filled 2015-11-17: qty 1

## 2015-11-17 MED ORDER — LIDOCAINE HCL (PF) 1 % IJ SOLN
INTRAMUSCULAR | Status: AC
  Filled 2015-11-17: qty 5

## 2015-11-17 MED ORDER — ONDANSETRON HCL 4 MG/2ML IJ SOLN
4.0000 mg | Freq: Four times a day (QID) | INTRAMUSCULAR | Status: DC | PRN
  Administered 2015-11-20 – 2015-11-30 (×11): 4 mg via INTRAVENOUS
  Filled 2015-11-17 (×14): qty 2

## 2015-11-17 MED ORDER — MAGNESIUM SULFATE 2 GM/50ML IV SOLN
2.0000 g | Freq: Once | INTRAVENOUS | Status: AC
  Administered 2015-11-17: 2 g via INTRAVENOUS
  Filled 2015-11-17: qty 50

## 2015-11-17 MED ORDER — ENOXAPARIN SODIUM 40 MG/0.4ML ~~LOC~~ SOLN
40.0000 mg | Freq: Every day | SUBCUTANEOUS | Status: DC
  Administered 2015-11-17 – 2015-11-23 (×7): 40 mg via SUBCUTANEOUS
  Filled 2015-11-17 (×7): qty 0.4

## 2015-11-17 NOTE — ED Notes (Signed)
Attempted to call report to [phone].

## 2015-11-17 NOTE — Consult Note (Signed)
Reason for Consult: Abdominal distension Referring Physician: Dr. Dina Rich (ED)  Meagan Dean is an 45 y.o. female.  HPI: Pt with acute onset of abdominal pain, nausea and vomiting.  She has a roommate, but no one else in the house.  He had some imitation crab for supper. She was fine prior to waking up this 2 AM  With the acute onset of these symptoms.   She came to the ED, work up so far shows she is afebrile, she continues to have nausea and diarrhea, (4 times in ED since admit here.)  Labs show she has a K+ of 2.5, glucose of 235, albumin is low.  WBC is normal. Film shows a massively dilated stomach with small bowel distension.  She has a Lap band from 2010 and has lost 124 lbs since the band was placed.  Dr. Lucia Gaskins has review the film and Band appears in good position.  I have removed 7 ml of fluid and we are having ER place an NG.  I think she has gastroenteritis.  Will defer to Medicine to admit.  Hopefully with decompression she will feel better and issue will resolve.   (AP standard band per Dr. Lear Ng note.)  7 ML of fluid removed from the band.    Past Medical History  Diagnosis Date  . Right ureteral stone   . Renal calculus, left     NON-OBSTRUCTING  . History of hypertension     NO ISSUES SINCE WT LOSS AFTER GASTRIC BANDING  . Asthma due to seasonal allergies     MILD--  NO INHALER  . H/O hiatal hernia   . GERD (gastroesophageal reflux disease)   . Urgency of urination   . Endometriosis of pelvis   . History of obstructive sleep apnea     DX'D PRIOR TO GASTRIC BANDING  2010 --  NO ISSUES SINCE WT LOSS    Past Surgical History  Procedure Laterality Date  . Laparoscopic gastric banding  06-06-2008  . Cystoscopy with retrograde pyelogram, ureteroscopy and stent placement Right 06/18/2013    Procedure: [place], URETEROSCOPY AND STENT PLACEMENT;  Surgeon: Franchot Gallo, MD;  Location: [facility];  Service: Urology;  Laterality: Right;  .  Laparoscopy /  ovarian cystectomy/  laser ablation endometriosis  2003  . Exploratory lapartomy/  myomectomy  2005  . Cystoscopy with retrograde pyelogram, ureteroscopy and stent placement Right 07/05/2013    Procedure: CYSTOSCOPY , URETEROSCOPY ANDstone extraction;  Surgeon: Franchot Gallo, MD;  Location: [facility];  Service: Urology;  Laterality: Right;  . Laparoscopic appendectomy N/A 07/28/2014    Procedure: APPENDECTOMY LAPAROSCOPIC;  Surgeon: Jackolyn Confer, MD;  Location: [facility];  Service: General;  Laterality: N/A;    No family history on file.  Social History:  reports that she has never smoked. She has never used smokeless tobacco. She reports that she drinks alcohol. She reports that she does not use illicit drugs.  Allergies:  Allergies  Allergen Reactions  . Vicodin [Hydrocodone-Acetaminophen] Other (See Comments)    "very bad reaction"    Medications: Prior to Admission:  (Not in a hospital admission)  Results for orders placed or performed during the hospital encounter of 11/17/15 (from the past 48 hour(s))  CBC with Differential     Status: Abnormal   Collection Time: 11/17/15  4:50 AM  Result Value Ref Range   WBC 9.2 4.0 - 10.5 K/uL   RBC 3.98 3.87 - 5.11 MIL/uL   Hemoglobin 11.1 (  L) 12.0 - 15.0 g/dL   HCT 33.9 (L) 36.0 - 46.0 %   MCV 85.2 78.0 - 100.0 fL   MCH 27.9 26.0 - 34.0 pg   MCHC 32.7 30.0 - 36.0 g/dL   RDW 13.7 11.5 - 15.5 %   Platelets 409 (H) 150 - 400 K/uL   Neutrophils Relative % 62 %   Neutro Abs 5.8 1.7 - 7.7 K/uL   Lymphocytes Relative 32 %   Lymphs Abs 2.9 0.7 - 4.0 K/uL   Monocytes Relative 5 %   Monocytes Absolute 0.4 0.1 - 1.0 K/uL   Eosinophils Relative 1 %   Eosinophils Absolute 0.1 0.0 - 0.7 K/uL   Basophils Relative 0 %   Basophils Absolute 0.0 0.0 - 0.1 K/uL  Comprehensive metabolic panel     Status: Abnormal   Collection Time: 11/17/15  4:50 AM  Result Value Ref Range   Sodium 137 135 - 145 mmol/L    Potassium 2.5 (LL) 3.5 - 5.1 mmol/L    Comment: CRITICAL RESULT CALLED TO, READ BACK BY AND VERIFIED WITH: A.ASHLEY,RN [id number] 11/17/15 M.CAMPBELL    Chloride 108 101 - 111 mmol/L   CO2 21 (L) 22 - 32 mmol/L   Glucose, Bld 235 (H) 65 - 99 mg/dL   BUN 12 6 - 20 mg/dL   Creatinine, Ser 0.78 0.44 - 1.00 mg/dL   Calcium 8.3 (L) 8.9 - 10.3 mg/dL   Total Protein 6.8 6.5 - 8.1 g/dL   Albumin 3.4 (L) 3.5 - 5.0 g/dL   AST 20 15 - 41 U/L   ALT 10 (L) 14 - 54 U/L   Alkaline Phosphatase 49 38 - 126 U/L   Total Bilirubin 0.3 0.3 - 1.2 mg/dL   GFR calc non Af Amer >60 >60 mL/min   GFR calc Af Amer >60 >60 mL/min    Comment: (NOTE) The eGFR has been calculated using the CKD EPI equation. This calculation has not been validated in all clinical situations. eGFR's persistently <60 mL/min signify possible Chronic Kidney Disease.    Anion gap 8 5 - 15  Lipase, blood     Status: None   Collection Time: 11/17/15  4:50 AM  Result Value Ref Range   Lipase 47 11 - 51 U/L    Dg Abd Acute W/chest  11/17/2015  CLINICAL DATA:  Acute onset of generalized abdominal pain and distention. Initial encounter. EXAM: DG ABDOMEN ACUTE W/ 1V CHEST COMPARISON:  CT of the abdomen and pelvis from 08/01/2014, and CTA of the chest performed 05/18/2008 FINDINGS: The lungs are hypoexpanded, with mild right basilar atelectasis. There is no evidence of pleural effusion or pneumothorax. The cardiomediastinal silhouette is within normal limits. The stomach is markedly dilated with fluid and air. There is diffuse distention of small bowel loops, with scattered air-fluid levels on the upright view, concerning for small bowel obstruction. The patient's gastric band is noted overlying expected position. No free intra-abdominal air is identified on the provided upright view. No acute osseous abnormalities are seen; the sacroiliac joints are unremarkable in appearance. IMPRESSION: 1. Stomach markedly dilated with fluid and air. Diffuse  distention of small bowel loops, with scattered air-fluid levels, concerning small bowel obstruction. Would recommend nasogastric tube placement for decompression, after loosening the patient's gastric band. 2. Lungs hypoexpanded, with mild right basilar atelectasis. These results were called by telephone at the time of interpretation on 11/17/2015 at 6:51 am to Dr. Thayer Jew, who verbally acknowledged these results. Electronically Signed   By:  Garald Balding M.D.   On: 11/17/2015 06:52    Review of Systems  Constitutional: Negative.   HENT: Negative.   Eyes: Negative.   Respiratory: Negative.   Cardiovascular: Negative.   Gastrointestinal: Positive for heartburn (occasoinal ), nausea, vomiting, abdominal pain, diarrhea and constipation. Negative for blood in stool and melena.  Genitourinary:       Odor   Musculoskeletal: Negative.   Skin: Negative.   Neurological: Negative.   Endo/Heme/Allergies: Negative.   Psychiatric/Behavioral: Negative.    Blood pressure 129/88, pulse 103, resp. rate 47, last menstrual period 11/17/2015, SpO2 98 %. Physical Exam  Constitutional: She is oriented to person, place, and time. She appears well-developed. No distress.  HENT:  Head: Normocephalic and atraumatic.  Nose: Nose normal.  Eyes: Right eye exhibits no discharge. Left eye exhibits no discharge. No scleral icterus.  Neck: Normal range of motion. Neck supple. No JVD present. No tracheal deviation present. No thyromegaly present.  Cardiovascular: Regular rhythm, normal heart sounds and intact distal pulses.   No murmur heard. Tachycardic   Respiratory: Effort normal and breath sounds normal. No respiratory distress. She has no wheezes. She has no rales. She exhibits no tenderness.  GI: She exhibits distension. She exhibits no mass. There is tenderness (sore from distension). There is no rebound and no guarding.  Musculoskeletal: She exhibits no edema or tenderness.  Lymphadenopathy:    She  has no cervical adenopathy.  Neurological: She is alert and oriented to person, place, and time. No cranial nerve deficit.  Skin: Skin is warm and dry. No rash noted. She is not diaphoretic. No erythema. No pallor.  Psychiatric: She has a normal mood and affect. Her behavior is normal. Judgment and thought content normal.    Assessment/Plan: Significant gastric distension with Lap Band in place Gastroenteritis Hypokalemia Hyperglycemia  Plan:  I have removed 7 ML of fluid from the Lap band.  I have ask ED to place an NG.  I would recommend she be placed on LIWS, IV hydration, bowel rest and repeat film in AM.  Hopefully this will resolve without any further intervention.    Meagan Dean 11/17/2015, 8:31 AM

## 2015-11-17 NOTE — ED Notes (Signed)
Pt taken to Xray.

## 2015-11-17 NOTE — ED Notes (Signed)
PA at bedside, LapBand procedure completed at bedside.

## 2015-11-17 NOTE — H&P (Signed)
Date: 11/17/2015               Patient Name:  Meagan Dean MRN: [id number]  DOB: [birth year] Age / Sex: 45 y.o., female   PCP: No Pcp Per Patient         Medical Service: Internal Medicine Teaching Service         Attending Physician: Dr. Oval Linsey, MD    First Contact: Dr. Heber Geiger Pager: [phone]  Second Contact: Dr. Arcelia Jew Pager: [phone]       After Hours (After 5p/  First Contact Pager: [phone]  weekends / holidays): Second Contact Pager: [phone]   Chief Complaint: abdominal pain, vomiting  History of Present Illness: Meagan Dean is a 45 yo female with PMHx of lap band procedure in 2010, ureterolithasis, perforated appendicitis s/p lap appendectomy in March 2016, and fibroids who presented to the ED last night with complaint of vomiting and abdominal pain. Patient states she felt slightly nauseous yesterday and constipated. Her symptoms were progressive through the night to the point where she was in severe diffuse abdominal pain, nauseous and with persistent vomiting. She also felt bloated and could not have a bowel movement. She denied any fever, chills, chest pain, shortness of breath, weakness, lightheadedness, blood in her stools, or dysuria. However, in the ED, she later developed diarrhea with 4 watery bowel movements  In the ED, patient was found to be normotensive, tachycardic in the 100s to 120s, RR 21, and satting well on room air. CBC showed anemia with hgb of 11.1, above her baseline of 9. BMET revealed hypokalemia at 2.5 and hyperglycemia at 235. LFTs were normal. Abdominal Xray showed a stomach markedly dilated with fluid and air. Diffuse distention of small bowel loops, with scattered air-fluid levels, concerning small bowel obstruction. Patient was seen by general surgery who believed the band was in good position, but removed 7 ml of fluid with almost instant relief of pain in the patient.   Patient was seen and examined this morning. She is fatigued, but overall  feels much better. Her pain is resolved, but states her abdomen is "sore." She denies fever, nausea, vomiting, diarrhea. She had a formed bowel movement in the ED.  Meds: Current Facility-Administered Medications  Medication Dose Route Frequency Provider Last Rate Last Dose  . 0.9 % NaCl with KCl 40 mEq / L  infusion   Intravenous Continuous Alexa R Burns, MD      . dextrose 5 %-0.45 % sodium chloride infusion   Intravenous STAT Dorie Rank, MD 125 mL/hr at 11/17/15 1208    . lidocaine (PF) (XYLOCAINE) 1 % injection             Allergies: Allergies as of 11/17/2015 - Review Complete 11/17/2015  Allergen Reaction Noted  . Other Itching 11/17/2015  . Vicodin [hydrocodone-acetaminophen] Other (See Comments) 06/06/2011   Past Medical History  Diagnosis Date  . Right ureteral stone   . Renal calculus, left     NON-OBSTRUCTING  . History of hypertension     NO ISSUES SINCE WT LOSS AFTER GASTRIC BANDING  . Asthma due to seasonal allergies     MILD--  NO INHALER  . H/O hiatal hernia   . GERD (gastroesophageal reflux disease)   . Urgency of urination   . Endometriosis of pelvis   . History of obstructive sleep apnea     DX'D PRIOR TO GASTRIC BANDING  2010 --  NO ISSUES SINCE WT LOSS    Family  History:  Grandmother: Cancer  Social History:  Tobacco Use: Denies Alcohol Use: Occasional Illicit Drug Use: Denies  Review of Systems: A complete ROS was negative except as per HPI.   Physical Exam: Filed Vitals:   11/17/15 0830 11/17/15 0900 11/17/15 0930 11/17/15 1025  BP: 135/100 139/97 142/90 136/93  Pulse: 110 114 121 102  Temp:    98.5 F (36.9 C)  TempSrc:    Oral  Resp: 22 24 29 24   Height:    5\' 2"  (1.575 m)  SpO2: 94% 99% 99% 100%   General: Vital signs reviewed.  Patient is well-developed and well-nourished, in no acute distress and cooperative with exam.  Head: Normocephalic and atraumatic. Eyes: PERRLA, no scleral icterus.  Neck: Supple, trachea midline, no carotid  bruit present.  Cardiovascular: Tachycardic, regular rhythm, S1 normal, S2 normal, no murmurs, gallops, or rubs. Pulmonary/Chest: Clear to auscultation bilaterally, no wheezes, rales, or rhonchi. Abdominal: Soft, tender to palpation in all 4 quadrants, mildly distended, BS hypoactive, but present, no guarding present.  Extremities: No lower extremity edema bilaterally, pulses symmetric and intact bilaterally.  Skin: Warm, dry and intact. No rashes or erythema. Psychiatric: Normal mood and affect. speech and behavior is normal. Cognition and memory are normal.   Abdominal Xray showed a stomach markedly dilated with fluid and air. Diffuse distention of small bowel loops, with scattered air-fluid levels, concerning small bowel obstruction.  Repeat abdominal films after removal of fluid from band and NGT placement showed that the nasogastric tube appears well positioned and there was Significantly decreased distention of the stomach status post nasogastric tube placement.  Assessment & Plan by Problem: Principal Problem:   Partial bowel obstruction (HCC) Active Problems:   Hypokalemia   Perforated appendicitis s/p lap appy 07/28/2014   Hyperglycemia  Meagan Dean is a 45 yo female with PMHx of lap band procedure in 2010, ureterolithasis, perforated appendicitis s/p lap appendectomy in March 2016, and fibroids who presented to the ED last night with complaint of vomiting and abdominal pain and found to have an SBO.  Partial SBO: Patient presented with acute onset of diffuse abdominal pain, nausea, vomiting, abdominal distention, and bloating. Abdominal Xray revealed stomach markedly dilated with fluid and air. Diffuse distention of small bowel loops, with scattered air-fluid levels, concerning small bowel obstruction. No leukocytosis. Surgery placed a nasogastric tube and withdrew 7 mL of lfuid from her lap band with great improvement in her symptoms. Surgery recommended bowel rest, IV hydration and  repeating abdominal films in the morning. Repeat abdominal films after removal of fluid from band and NGT placement showed that the nasogastric tube appears well positioned and there was significantly decreased distention of the stomach status post nasogastric tube placement. Her symptoms were likely due to a partial SBO secondary to underlying viral gastroenteritis and worsened by lap band or secondary to previous abdominal surgeries (lap appy in 2016). She continues to have abdominal pain but only to palpation and decreased, but active bowel sounds. She is have bowel movement.  -Surgery following, appreciate recommendations -NS with KCl 40 mEq at 125 cc/hr for 12 hours -Morphine 2 mg Q4H prn  -NPO -Zofran 4 mg Q6H prn nausea -Continue NG Tube  -Repeat abdominal films tomorrow am (per surgery) -Repeat CBC/BMET tomorrow am  Hypokalemia: K 2.5 on admission. Likely due to vomiting and SBO; however, patient appears to have a history of hypokalemia in the past and has previously been on replacement. She denies currently taking potassium or any other daily medications.  Patient received KCl 10 mEq x 2 in the ED and Magnesium 2 grams. Last magnesium was 2.1 in 2015.  -Repeat BMET tomorrow am  -Check magnesium -NS with KCl 40 mEq at 125 cc/hr for 12 hours  Hyperglycemia: Elevated at 235 this morning. HgbA1c 4.8 in 2015. One prior fasting glucose of 135 in 2016.  -Check HgbA1c  DVT/PE ppx: Lovenox SQ CODE: FULL FEN: NPO  Dispo: Admit patient to Inpatient with expected length of stay greater than 2 midnights.  Signed: Martyn Malay, DO PGY-3 Internal Medicine Resident Pager # [phone] 11/17/2015 12:21 PM

## 2015-11-17 NOTE — ED Provider Notes (Signed)
CSN: [id number]     Arrival date & time 11/17/15  0443 History   First MD Initiated Contact with Patient 11/17/15 [phone]     Chief Complaint  Patient presents with  . Abdominal Pain  . Emesis  . Diarrhea     (Consider location/radiation/quality/duration/timing/severity/associated sxs/prior Treatment) HPI  This is a 45 year old female with a history of lap band, appendicitis, kidney stones, asthma who presents with abdominal pain, nausea, vomiting, and diarrhea. Patient reports onset of symptoms this morning at 2 AM. She had acute onset of upper abdominal pain which is sharp and nonradiating. She states "I'm very uncomfortable." She states that she feels very full. Current pain is 11 out of 10. She reports nonbilious, nonbloody emesis and frequent diarrhea. Denies fevers, chest pain, or shortness of breath. Denies urinary symptoms. Possible sick contacts at work.  Past Medical History  Diagnosis Date  . Right ureteral stone   . Renal calculus, left     NON-OBSTRUCTING  . History of hypertension     NO ISSUES SINCE WT LOSS AFTER GASTRIC BANDING  . Asthma due to seasonal allergies     MILD--  NO INHALER  . H/O hiatal hernia   . GERD (gastroesophageal reflux disease)   . Urgency of urination   . Endometriosis of pelvis   . History of obstructive sleep apnea     DX'D PRIOR TO GASTRIC BANDING  2010 --  NO ISSUES SINCE WT LOSS   Past Surgical History  Procedure Laterality Date  . Laparoscopic gastric banding  06-06-2008  . Cystoscopy with retrograde pyelogram, ureteroscopy and stent placement Right 06/18/2013    Procedure: [place], URETEROSCOPY AND STENT PLACEMENT;  Surgeon: Franchot Gallo, MD;  Location: [facility];  Service: Urology;  Laterality: Right;  . Laparoscopy /  ovarian cystectomy/  laser ablation endometriosis  2003  . Exploratory lapartomy/  myomectomy  2005  . Cystoscopy with retrograde pyelogram, ureteroscopy and stent placement Right 07/05/2013    Procedure: CYSTOSCOPY , URETEROSCOPY ANDstone extraction;  Surgeon: Franchot Gallo, MD;  Location[facility];  Service: Urology;  Laterality: Right;  . Laparoscopic appendectomy N/A 07/28/2014    Procedure: APPENDECTOMY LAPAROSCOPIC;  Surgeon: Jackolyn Confer, MD;  Location: [facility];  Service: General;  Laterality: N/A;   No family history on file. Social History  Substance Use Topics  . Smoking status: Never Smoker   . Smokeless tobacco: Never Used  . Alcohol Use: 0.0 oz/week    0 Standard drinks or equivalent per week     Comment: occasionally   OB History    No data available     Review of Systems  Constitutional: Negative for fever.  Respiratory: Negative for shortness of breath.   Cardiovascular: Negative for chest pain.  Gastrointestinal: Positive for nausea, vomiting, abdominal pain and diarrhea.  Genitourinary: Positive for vaginal bleeding. Negative for dysuria.  Skin: Negative for wound.  All other systems reviewed and are negative.     Allergies  Vicodin  Home Medications   Prior to Admission medications   Medication Sig Start Date End Date Taking? Authorizing Provider  Potassium Chloride ER 20 MEQ TBCR Take 20 mEq by mouth daily. 06/28/15   Lucious Groves, DO  pseudoephedrine (SUDAFED) 30 MG tablet Take 30 mg by mouth every 4 (four) hours as needed for congestion.    Historical Provider, MD   BP 123/85 mmHg  Pulse 100  SpO2 100%  LMP 11/17/2015 Physical Exam  Constitutional: She is oriented to  person, place, and time. She appears well-developed and well-nourished.  Uncomfortable appearing, will not sit still  HENT:  Head: Normocephalic and atraumatic.  Cardiovascular: Normal rate, regular rhythm and normal heart sounds.   No murmur heard. Pulmonary/Chest: Effort normal and breath sounds normal. No respiratory distress. She has no wheezes.  Abdominal: Soft. Bowel sounds are normal. There is tenderness. There is no rebound and no  guarding.  Tenderness palpation epigastrium left upper quadrant without rebound or guarding, lap band apparatus palpated just right of midline  Neurological: She is alert and oriented to person, place, and time.  Skin: Skin is warm and dry.  Psychiatric:  Anxious  Nursing note and vitals reviewed.   ED Course  Procedures (including critical care time) Labs Review Labs Reviewed  CBC WITH DIFFERENTIAL/PLATELET - Abnormal; Notable for the following:    Hemoglobin 11.1 (*)    HCT 33.9 (*)    Platelets 409 (*)    All other components within normal limits  COMPREHENSIVE METABOLIC PANEL - Abnormal; Notable for the following:    Potassium 2.5 (*)    CO2 21 (*)    Glucose, Bld 235 (*)    Calcium 8.3 (*)    Albumin 3.4 (*)    ALT 10 (*)    All other components within normal limits  LIPASE, BLOOD  URINALYSIS, ROUTINE W REFLEX MICROSCOPIC (NOT AT [facility])  POC URINE PREG, ED    Imaging Review Dg Abd Acute W/chest  11/17/2015  CLINICAL DATA:  Acute onset of generalized abdominal pain and distention. Initial encounter. EXAM: DG ABDOMEN ACUTE W/ 1V CHEST COMPARISON:  CT of the abdomen and pelvis from 08/01/2014, and CTA of the chest performed 05/18/2008 FINDINGS: The lungs are hypoexpanded, with mild right basilar atelectasis. There is no evidence of pleural effusion or pneumothorax. The cardiomediastinal silhouette is within normal limits. The stomach is markedly dilated with fluid and air. There is diffuse distention of small bowel loops, with scattered air-fluid levels on the upright view, concerning for small bowel obstruction. The patient's gastric band is noted overlying expected position. No free intra-abdominal air is identified on the provided upright view. No acute osseous abnormalities are seen; the sacroiliac joints are unremarkable in appearance. IMPRESSION: 1. Stomach markedly dilated with fluid and air. Diffuse distention of small bowel loops, with scattered air-fluid levels, concerning  small bowel obstruction. Would recommend nasogastric tube placement for decompression, after loosening the patient's gastric band. 2. Lungs hypoexpanded, with mild right basilar atelectasis. These results were called by telephone at the time of interpretation on 11/17/2015 at 6:51 am to Dr. Thayer Jew, who verbally acknowledged these results. Electronically Signed   By: Garald Balding M.D.   On: 11/17/2015 06:52   I have personally reviewed and evaluated these images and lab results as part of my medical decision-making.   EKG Interpretation   Date/Time:  Friday November 17 2015 06:11:53 EDT Ventricular Rate:  97 PR Interval:    QRS Duration: 89 QT Interval:  382 QTC Calculation: 486 R Axis:   27 Text Interpretation:  Sinus rhythm Borderline prolonged PR interval  Borderline T abnormalities, diffuse leads Borderline prolonged QT interval  Confirmed by Dina Rich  MD, Nader Boys ([id number]) on 11/17/2015 6:16:44 AM Also  confirmed by Dina Rich  MD, Alanson Hausmann ([id number]), editor Gilford Rile, CCT, [place]  ([id number])  on 11/17/2015 6:59:05 AM      MDM   Final diagnoses:  Intestinal obstruction, unspecified type ([place])    Patient presents with abdominal pain, vomiting, diarrhea. Tender on  exam but nontoxic. Vital signs largely reassuring. She does appear very uncomfortable. Patient was given pain and nausea medication and fluids. Patient declined fluids that she felt like "this was making me bloated."  She is notably hypokalemic. This was replaced. No leukocytosis. Plain films of the abdomen show a markedly dilated stomach and air-fluid levels concerning for partial small bowel obstruction. Given that she has a lap band, she will need this deflated prior to NG tube placement. I have consult to Gen. surgery for evaluation and management. They will come and release her lap band. Anticipate she'll need admission with bowel rest and NG tube placement. At this time, CT scan was not obtained. Will defer to surgery regarding  further imaging and management.      Merryl Hacker, MD 11/17/15 2256

## 2015-11-17 NOTE — Progress Notes (Signed)
Procedure:  Gastric lap band adjustment Skin prepped with chlorhexidine 1 ml of 1% lidocaine injected over band site.  After she was anesthetized I used a Hueber needle to remove 7 ml of fluid.  She got immediate relief, with removal of fluid.

## 2015-11-17 NOTE — ED Notes (Signed)
Tried to obtain urine sample, sample contaminated with feces. pt said she will try again soon

## 2015-11-17 NOTE — Progress Notes (Signed)
Patient admitted to [facility] room 25. Drowsy, arouses to voice. NG tube intact. IVF infusing . Oriented to room. Call bell in reach. M.D. In to see.

## 2015-11-17 NOTE — ED Notes (Addendum)
Pt in from home with c/o sharp, tight LUQ pain, n/v/d since 0200. Pt denies seeing blood in vomit or diarrhea. Hx of Lap Band in 10'. Pt reports last meal was 10:30 last night. Pt was given 4mg  Zofran via EMS, still nauseous. A&ox4

## 2015-11-18 ENCOUNTER — Inpatient Hospital Stay (HOSPITAL_COMMUNITY): Payer: Self-pay

## 2015-11-18 ENCOUNTER — Encounter (HOSPITAL_COMMUNITY): Payer: Self-pay

## 2015-11-18 DIAGNOSIS — Z9884 Bariatric surgery status: Secondary | ICD-10-CM

## 2015-11-18 LAB — CBC WITH DIFFERENTIAL/PLATELET
Basophils Absolute: 0 10*3/uL (ref 0.0–0.1)
Basophils Relative: 0 %
EOS PCT: 0 %
Eosinophils Absolute: 0 10*3/uL (ref 0.0–0.7)
LYMPHS ABS: 1.4 10*3/uL (ref 0.7–4.0)
LYMPHS PCT: 7 %
Monocytes Absolute: 1.2 10*3/uL — ABNORMAL HIGH (ref 0.1–1.0)
Monocytes Relative: 6 %
Neutro Abs: 18.8 10*3/uL — ABNORMAL HIGH (ref 1.7–7.7)
Neutrophils Relative %: 87 %

## 2015-11-18 LAB — CBC
HEMATOCRIT: 46.5 % — AB (ref 36.0–46.0)
HEMOGLOBIN: 15.6 g/dL — AB (ref 12.0–15.0)
MCH: 28.3 pg (ref 26.0–34.0)
MCHC: 33.5 g/dL (ref 30.0–36.0)
MCV: 84.2 fL (ref 78.0–100.0)
Platelets: 529 10*3/uL — ABNORMAL HIGH (ref 150–400)
RBC: 5.52 MIL/uL — ABNORMAL HIGH (ref 3.87–5.11)
RDW: 14 % (ref 11.5–15.5)
WBC: 23.9 10*3/uL — ABNORMAL HIGH (ref 4.0–10.5)

## 2015-11-18 LAB — BASIC METABOLIC PANEL
Anion gap: 10 (ref 5–15)
BUN: 14 mg/dL (ref 6–20)
CHLORIDE: 109 mmol/L (ref 101–111)
CO2: 20 mmol/L — ABNORMAL LOW (ref 22–32)
CREATININE: 0.8 mg/dL (ref 0.44–1.00)
Calcium: 8.2 mg/dL — ABNORMAL LOW (ref 8.9–10.3)
GFR calc Af Amer: >60 mL/min (ref >60)
GLUCOSE: 126 mg/dL — AB (ref 65–99)
Potassium: 3.4 mmol/L — ABNORMAL LOW (ref 3.5–5.1)
SODIUM: 139 mmol/L (ref 135–145)

## 2015-11-18 LAB — HEMOGLOBIN A1C
Hgb A1c MFr Bld: 4.9 % (ref 4.8–5.6)
MEAN PLASMA GLUCOSE: 94 mg/dL

## 2015-11-18 LAB — MAGNESIUM: Magnesium: 2.1 mg/dL (ref 1.7–2.4)

## 2015-11-18 MED ORDER — SODIUM CHLORIDE 0.9 % IV BOLUS (SEPSIS)
1000.0000 mL | Freq: Once | INTRAVENOUS | Status: AC
  Administered 2015-11-18: 1000 mL via INTRAVENOUS

## 2015-11-18 MED ORDER — WHITE PETROLATUM GEL
Status: AC
  Administered 2015-11-18: 17:00:00
  Filled 2015-11-18: qty 1

## 2015-11-18 MED ORDER — SODIUM CHLORIDE 0.45 % IV SOLN
INTRAVENOUS | Status: AC
  Administered 2015-11-18 (×3): via INTRAVENOUS

## 2015-11-18 MED ORDER — POTASSIUM CHLORIDE IN NACL 40-0.9 MEQ/L-% IV SOLN
INTRAVENOUS | Status: DC
  Administered 2015-11-18: 125 mL/h via INTRAVENOUS
  Filled 2015-11-18 (×2): qty 1000

## 2015-11-18 MED ORDER — SODIUM CHLORIDE 0.9 % IV SOLN: INTRAVENOUS | Status: DC

## 2015-11-18 NOTE — Progress Notes (Signed)
   Subjective: Ms. Silos was observed this morning on rounds.  She continues to have the NG tube and a new container is collecting fluid.  She continues to have abdominal pain but does not feel as distended as yesterday.  She reports being able to walk to the bathroom.  She denies SOB or chest pain.  She continues to have nausea but has not vomited.   Objective: Vital signs in last 24 hours: Filed Vitals:   11/17/15 1436 11/17/15 2112 11/18/15 0424 11/18/15 0600  BP: 129/88 134/100 122/100 127/91  Pulse: 104 123 123 115  Temp: 97.8 F (36.6 C) 100 F (37.8 C) 100.2 F (37.9 C) 100 F (37.8 C)  TempSrc: Oral Oral Oral Oral  Resp: 21 19 19 18   Height:      Weight:      SpO2: 99% 99% 99% 99%   Physical Exam Physical Exam  Constitutional: She is oriented to person, place, and time.  HENT:  Head: Normocephalic and atraumatic.  Cardiovascular: Regular rhythm and normal heart sounds.   Tachycardic   Pulmonary/Chest: Effort normal and breath sounds normal. No respiratory distress.  Abdominal: She exhibits distension. There is tenderness.  Hypoactive bowel sounds  Musculoskeletal: She exhibits no edema.  Neurological: She is alert and oriented to person, place, and time.  Skin: Skin is warm and dry.     Assessment/Plan: Partial bowel obstruction (HCC) The partial bowel obstruction is most likely due to adhesions from previous abdominal surgeries.  Most recent X ray 7/8 showed persistent gaseous small bowel distention with relative decrease in distal colonic gas and stool since previous exam on 7/7.  Patient's x rays correlates with clinical symptoms.  Patient states she feels a lot less distended from yesterday.  NG tube is still in place and continues to remove fluid. Surgery is following patient.  Patient has recently developed an increased WBC 23.9.  There is now suspicion for infection.  May need to start an abx if surgery does not. -CBC       Hypokalemia K was 2.5 on admission  and is currently 3.4.  She has been repleated through IV fluids and almost at goal.  - Continue NS with KCL 83mEq at 125cc/hr for 12 hrs     Dispo: Anticipated discharge in approximately 2 day(s).   LOS: 1 day   Valinda Party, DO 11/18/2015, 11:43 AM Pager: [phone]

## 2015-11-18 NOTE — Progress Notes (Signed)
  Date: 11/18/2015  Patient name: Meagan Dean  Medical record number: [id number]  Date of birth: 1971-04-08   I have seen and evaluated [name] and discussed their care with the Residency Team. Ms Hutmacher is a 45 yo female with h/o lap band placement, perforated appendicitis s/p lap appendectomy in March 2016, and 2 laparoscopies.  She came to the ED for N, constipation, vomiting, bloating, and abd that progressed in severity. In the ED, she was found to have an SBO with markedly dilated stomach. Surgery was consulted and removed 7 ml of fluid with almost instant relief of pain in the patient. She had an NG placed and her subsequent plain films showed improvement but persistence of her SBO.  This AM, she still feels sore and cont to have NG drainage. Surgery is considering ABX due to WBC increase to 25 and temp of 100.2.  PMHx, Fam Hx, and/or Soc Hx : Used to work for [employer], now working for [employer]. No insurance.   Filed Vitals:   11/18/15 0424 11/18/15 0600  BP: 122/100 127/91  Pulse: 123 115  Temp: 100.2 F (37.9 C) 100 F (37.8 C)  Resp: 19 18   NAD H tachy No murmur ABD no BS, no obvious distension, soft, + tender  I personally viewed his ABD images and confirmed by reading with the official read. Massively distended stomach, air fluid levels throughout small bowel.  I personally viewed his EKG and confirmed by reading with the official read. Sinus, nl axis, no ischemic changes  Assessment and Plan: I have seen and evaluated the patient as outlined above. I agree with the formulated Assessment and Plan as detailed in the residents' note, with the following changes:   1. SBO - the most likely etiology would be adhesions from her prior abd surgeries. As she had h/o gastric weight loss surgery, surgery was consulted on admit and removed fluid from her lap band which, along with the NG, helped with decompression. She cont to have ABO this AM though and now there is concern for  infxn with leukocytosis and temp. Her leukocytosis could be stress rxn - would need to follow closely if surgery does not start ABX.Marland KitchenHopefully the SBO will resolve with time.  2. 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 It would be nice to get outpt K levels when she is stable. She has no h/o HTN and is on no meds which would lower her K.   Bartholomew Crews, MD 7/8/201710:01 AM

## 2015-11-18 NOTE — Progress Notes (Signed)
Subjective: Still draining feculent fluid from her NG, feels better, but now with low grade fever, BP and and elevated WBC.  She has not had further diarrhea since she came to the floor but was having significant diarrhea on admit.   Sump full of fluid, will have nurse fix this.   Objective: Vital signs in last 24 hours: Temp:  [97.8 F (36.6 C)-100.2 F (37.9 C)] 100 F (37.8 C) 12-08-2022 0600) Pulse Rate:  [102-123] 115 Dec 08, 2022 0600) Resp:  [18-29] 18 12/08/2022 0600) BP: (122-142)/(88-100) 127/91 mmHg December 08, 2022 0600) SpO2:  [99 %-100 %] 99 % 12/08/22 0600) Weight:  [74.934 kg (165 lb 3.2 oz)] 74.934 kg (165 lb 3.2 oz) (07/07 1252) Last BM Date: 11/17/15   840 NG Urine 700 Stools:   Low grade fever around 100 since 1900 last PM BP is still up  WBC 23.9 H/H is up,  K+ is still low but better Film this AM:  Persistent gaseous small bowel distention with relative decrease in distal colonic gas and stool since previous exam, may represent gastroenteritis though small bowel obstruction is not excluded with this pattern. Intake/Output from previous day: 07/07 0701 - December 08, 2022 0700 In: 968.3 [I.V.:858.3; NG/GT:60; IV Piggyback:50] Out: Chambers [Urine:700; Emesis/NG output:840] Intake/Output this shift:    General appearance: alert, cooperative and no distress Resp: clear to auscultation bilaterally GI: still a little distended not much in the way of bowel sounds, a little tender on palpation over port site.  No erythema.  No diarhea since ED.  Lab Results:   Recent Labs  11/17/15 0450 December 08, 2015 0528  WBC 9.2 23.9*  HGB 11.1* 15.6*  HCT 33.9* 46.5*  PLT 409* 529*    BMET  Recent Labs  11/17/15 0450 12-08-15 0528  NA 137 139  K 2.5* 3.4*  CL 108 109  CO2 21* 20*  GLUCOSE 235* 126*  BUN 12 14  CREATININE 0.78 0.80  CALCIUM 8.3* 8.2*   PT/INR No results for input(s): LABPROT, INR in the last 72 hours.   Recent Labs Lab 11/17/15 0450  AST 20  ALT 10*  ALKPHOS 49   BILITOT 0.3  PROT 6.8  ALBUMIN 3.4*     Lipase     Component Value Date/Time   LIPASE 47 11/17/2015 0450     Studies/Results: Dg Abd 2 Views  12-08-2015  CLINICAL DATA:  Gastroenteritis, acute EXAM: ABDOMEN - 2 VIEW COMPARISON:  11/17/2015 FINDINGS: Nasogastric tube coiled in stomach with tip projecting over distal antrum. Decreased gaseous distention since previous exam. Prior laparoscopic gastric banding. Diffuse dilatation of small bowel loops. Some gas and stool are present in the RIGHT and transverse colon but little distal colonic gas or stool noted. Although findings may represent gastroenteritis, small bowel obstruction is not completely excluded. Osseous structures unremarkable. Urinary tract calcification. IMPRESSION: Persistent gaseous small bowel distention with relative decrease in distal colonic gas and stool since previous exam, may represent gastroenteritis though small bowel obstruction is not excluded with this pattern. Electronically Signed   By: Lavonia Dana M.D.   On: 12/08/15 08:20   Dg Abd 2 Views  11/17/2015  CLINICAL DATA:  Gastroenteritis. Status post nasogastric tube insertion. EXAM: ABDOMEN - 2 VIEW COMPARISON:  Abdomen x-ray from earlier same day. FINDINGS: Nasogastric tube appears adequately positioned in the stomach, with tip likely in the stomach pylorus region or duodenal bulb. Significantly decreased distention of the stomach status post nasogastric tube placement. Again noted are mildly prominent gas-filled loops of small bowel throughout the  abdomen and pelvis, with scattered nonspecific air-fluid levels compatible with given history of gastroenteritis. IMPRESSION: Nasogastric tube appears well positioned. Significantly decreased distention of the stomach status post nasogastric tube placement. Electronically Signed   By: Franki Cabot M.D.   On: 11/17/2015 10:21   Dg Abd Acute W/chest  11/17/2015  CLINICAL DATA:  Acute onset of generalized abdominal pain and  distention. Initial encounter. EXAM: DG ABDOMEN ACUTE W/ 1V CHEST COMPARISON:  CT of the abdomen and pelvis from 08/01/2014, and CTA of the chest performed 05/18/2008 FINDINGS: The lungs are hypoexpanded, with mild right basilar atelectasis. There is no evidence of pleural effusion or pneumothorax. The cardiomediastinal silhouette is within normal limits. The stomach is markedly dilated with fluid and air. There is diffuse distention of small bowel loops, with scattered air-fluid levels on the upright view, concerning for small bowel obstruction. The patient's gastric band is noted overlying expected position. No free intra-abdominal air is identified on the provided upright view. No acute osseous abnormalities are seen; the sacroiliac joints are unremarkable in appearance. IMPRESSION: 1. Stomach markedly dilated with fluid and air. Diffuse distention of small bowel loops, with scattered air-fluid levels, concerning small bowel obstruction. Would recommend nasogastric tube placement for decompression, after loosening the patient's gastric band. 2. Lungs hypoexpanded, with mild right basilar atelectasis. These results were called by telephone at the time of interpretation on 11/17/2015 at 6:51 am to Dr. Thayer Jew, who verbally acknowledged these results. Electronically Signed   By: Garald Balding M.D.   On: 11/17/2015 06:52    Medications: . enoxaparin (LOVENOX) injection  40 mg Subcutaneous Daily   . 0.9 % NaCl with KCl 40 mEq / L 125 mL/hr (11/18/15 0512)    Assessment/Plan  Significant gastric distension with Lap Band in place Gastroenteritis Elevated WBC and low grade fever Hypokalemia   KCl in IV fluids Hyperglycemia FEN:  NPO/IV fluids ID: None DVT:  Lovenox  Plan:  Continue NG drainage.  I think we should place her on some antibiotics, I will discuss with Dr. Redmond Pulling.   LOS: 1 day    Evelisse Szalkowski 11/18/2015 [phone]

## 2015-11-19 ENCOUNTER — Inpatient Hospital Stay (HOSPITAL_COMMUNITY): Payer: Self-pay

## 2015-11-19 LAB — CBC
HEMATOCRIT: 33.6 % — AB (ref 36.0–46.0)
HEMOGLOBIN: 10.9 g/dL — AB (ref 12.0–15.0)
MCH: 27.7 pg (ref 26.0–34.0)
MCHC: 32.4 g/dL (ref 30.0–36.0)
MCV: 85.5 fL (ref 78.0–100.0)
Platelets: 335 10*3/uL (ref 150–400)
RBC: 3.93 MIL/uL (ref 3.87–5.11)
RDW: 14 % (ref 11.5–15.5)
WBC: 13 10*3/uL — ABNORMAL HIGH (ref 4.0–10.5)

## 2015-11-19 LAB — BASIC METABOLIC PANEL
Anion gap: 4 — ABNORMAL LOW (ref 5–15)
BUN: 12 mg/dL (ref 6–20)
CHLORIDE: 109 mmol/L (ref 101–111)
CO2: 24 mmol/L (ref 22–32)
CREATININE: 0.66 mg/dL (ref 0.44–1.00)
Calcium: 7.5 mg/dL — ABNORMAL LOW (ref 8.9–10.3)
GFR calc Af Amer: >60 mL/min (ref >60)
GFR calc non Af Amer: >60 mL/min (ref >60)
Glucose, Bld: 94 mg/dL (ref 65–99)
Potassium: 3.2 mmol/L — ABNORMAL LOW (ref 3.5–5.1)
Sodium: 137 mmol/L (ref 135–145)

## 2015-11-19 MED ORDER — CETYLPYRIDINIUM CHLORIDE 0.05 % MT LIQD
7.0000 mL | Freq: Two times a day (BID) | OROMUCOSAL | Status: DC
  Administered 2015-11-19 – 2015-11-30 (×11): 7 mL via OROMUCOSAL

## 2015-11-19 MED ORDER — CHLORHEXIDINE GLUCONATE 0.12 % MT SOLN
15.0000 mL | Freq: Two times a day (BID) | OROMUCOSAL | Status: DC
  Administered 2015-11-19 – 2015-11-30 (×16): 15 mL via OROMUCOSAL
  Filled 2015-11-19 (×13): qty 15

## 2015-11-19 MED ORDER — LORATADINE 10 MG PO TABS
10.0000 mg | ORAL_TABLET | Freq: Every day | ORAL | Status: DC
  Administered 2015-11-19 – 2015-12-01 (×12): 10 mg via NASOGASTRIC
  Filled 2015-11-19 (×12): qty 1

## 2015-11-19 MED ORDER — POTASSIUM CHLORIDE 20 MEQ PO PACK
20.0000 meq | PACK | Freq: Two times a day (BID) | ORAL | Status: AC
  Administered 2015-11-19 – 2015-11-20 (×2): 20 meq via ORAL
  Filled 2015-11-19 (×2): qty 1

## 2015-11-19 MED ORDER — POTASSIUM CHLORIDE IN NACL 40-0.9 MEQ/L-% IV SOLN
INTRAVENOUS | Status: AC
  Administered 2015-11-19 (×2): 125 mL/h via INTRAVENOUS
  Filled 2015-11-19 (×3): qty 1000

## 2015-11-19 NOTE — Progress Notes (Signed)
Subjective: I pulled the NG out some, she has a headache and sinus are bothering her.  NG drainage is now more normal gastric and not feculent like yesterday.  They did not walk her yesterday.    Objective: Vital signs in last 24 hours: Temp:  [98.2 F (36.8 C)-98.4 F (36.9 C)] 98.2 F (36.8 C) (07/09 0500) Pulse Rate:  [100-115] 103 (07/09 0500) Resp:  [18-19] 19 (07/09 0500) BP: (127-133)/(74-89) 133/74 mmHg (07/09 0500) SpO2:  [95 %-98 %] 96 % (07/09 0500) Last BM Date: 11/17/15 NPO 1400 from the NG recorded yesterday, No further fever VSS Repeat labs this AM shows K+ 3.2 WBC is 13K, repeat CBC yesterday shows only a differential, no WBC  Film this AM shows:  Nasogastric tube tip and side port in distal stomach region. The nasogastric tube tip may actually be located at the pylorus -duodenum junction. Bowel gas pattern is currently unremarkable without obstruction or free air. There is fairly diffuse stool throughout the colon. Lap band is located at the gastric cardia. Intake/Output from previous day: 07/08 0701 - 07/09 0700 In: 3304.2 [I.V.:2195.8; IV Piggyback:1108.3] Out: 2200 [Emesis/NG output:2200] Intake/Output this shift:    General appearance: alert, cooperative, no distress and very anxious about moving the NG. Resp: clear to auscultation bilaterally GI: soft, few BS, NG drainage is more bilious this AM.  Looks better.  Lab Results:   Recent Labs  11/18/15 1059 11/19/15 0415  WBC [id number] 13.0*  HGB [id number] 10.9*  HCT [id number] 33.6*  PLT [id number]    BMET  Recent Labs  11/18/15 0528 11/19/15 0415  NA 139 137  K 3.4* 3.2*  CL 109 109  CO2 20* 24  GLUCOSE 126* 94  BUN 14 12  CREATININE 0.80 0.66  CALCIUM 8.2* 7.5*   PT/INR No results for input(s): LABPROT, INR in the last 72 hours.   Recent Labs Lab 11/17/15 0450  AST 20  ALT 10*  ALKPHOS 49  BILITOT 0.3  PROT 6.8  ALBUMIN 3.4*     Lipase     Component Value  Date/Time   LIPASE 47 11/17/2015 0450     Studies/Results: Dg Abd 1 View  11/19/2015  CLINICAL DATA:  Gastroenteritis EXAM: ABDOMEN - 1 VIEW COMPARISON:  November 18, 2015 FINDINGS: Nasogastric tube tip and side port are in the stomach. There is fairly diffuse stool throughout the colon. Bowel is no longer dilated. There are no appreciable air-fluid levels. No free air is apparent on this supine examination. There is a lap band at the level of the gastric cardia. There are surgical clips in the right lower quadrant of the abdomen. IMPRESSION: Nasogastric tube tip and side port in distal stomach region. The nasogastric tube tip may actually be located at the pylorus -duodenum junction. Bowel gas pattern is currently unremarkable without obstruction or free air. There is fairly diffuse stool throughout the colon. Lap band is located at the gastric cardia. Electronically Signed   By: Lowella Grip III M.D.   On: 11/19/2015 08:48   Dg Abd 2 Views  11/18/2015  CLINICAL DATA:  Gastroenteritis, acute EXAM: ABDOMEN - 2 VIEW COMPARISON:  11/17/2015 FINDINGS: Nasogastric tube coiled in stomach with tip projecting over distal antrum. Decreased gaseous distention since previous exam. Prior laparoscopic gastric banding. Diffuse dilatation of small bowel loops. Some gas and stool are present in the RIGHT and transverse colon but little distal colonic gas or stool noted. Although findings may represent gastroenteritis, small bowel obstruction is not  completely excluded. Osseous structures unremarkable. Urinary tract calcification. IMPRESSION: Persistent gaseous small bowel distention with relative decrease in distal colonic gas and stool since previous exam, may represent gastroenteritis though small bowel obstruction is not excluded with this pattern. Electronically Signed   By: Lavonia Dana M.D.   On: 11/18/2015 08:20   Dg Abd 2 Views  11/17/2015  CLINICAL DATA:  Gastroenteritis. Status post nasogastric tube insertion.  EXAM: ABDOMEN - 2 VIEW COMPARISON:  Abdomen x-ray from earlier same day. FINDINGS: Nasogastric tube appears adequately positioned in the stomach, with tip likely in the stomach pylorus region or duodenal bulb. Significantly decreased distention of the stomach status post nasogastric tube placement. Again noted are mildly prominent gas-filled loops of small bowel throughout the abdomen and pelvis, with scattered nonspecific air-fluid levels compatible with given history of gastroenteritis. IMPRESSION: Nasogastric tube appears well positioned. Significantly decreased distention of the stomach status post nasogastric tube placement. Electronically Signed   By: Franki Cabot M.D.   On: 11/17/2015 10:21    Medications: . antiseptic oral rinse  7 mL Mouth Rinse q12n4p  . chlorhexidine  15 mL Mouth Rinse BID  . enoxaparin (LOVENOX) injection  40 mg Subcutaneous Daily  . loratadine  10 mg Per NG tube Daily    Assessment/Plan Significant gastric distension with Lap Band in place Gastroenteritis Elevated WBC and low grade fever Hypokalemia KCl in IV fluids adding 40 meq thru NG today also.  Hyperglycemia FEN: NPO/IV fluids ID: None DVT: Lovenox   Plan:  I pulled the NG out some.  Will see how she does with this.  Continue NG for now.  She needs to be ambulated.  Will ask nurses to help with this.  Recheck labs and film in AM,.    LOS: 2 days    JENNINGS,WILLARD 11/19/2015 [phone]  I agree with Mr. Creig Hines assessment and plan. Kaylyn Lim, MD, FACS

## 2015-11-19 NOTE — Progress Notes (Signed)
   Subjective: No acute events overnight. She reports abdominal pain, but that it is slightly improved. Reports her sinuses are "stuffed up". NGT output 2200 ml over last 24 hours.  Objective: Vital signs in last 24 hours: Filed Vitals:   11/18/15 0600 11/18/15 1412 11/18/15 2117 11/19/15 0500  BP: 127/91 130/89 127/88 133/74  Pulse: 115 115 100 103  Temp: 100 F (37.8 C) 98.4 F (36.9 C) 98.3 F (36.8 C) 98.2 F (36.8 C)  TempSrc: Oral Oral Oral Oral  Resp: 18 18 18 19   Height:      Weight:      SpO2: 99% 98% 95% 96%   Physical Exam General: middle aged woman resting in bed, NAD HEENT: /AT, EOMI, NGT in place, sclera anicteric CV: RRR, no m/g/r Abd: BS hypoactive, diffuse tenderness to palpation of abdomen, slightly improved compared to yesterday Ext: warm, no edema Neuro: alert and oriented x 3  Assessment/Plan:  Partial SBO: Her NG tube output is stable around 2000 ml (1800 yesterday and 2200 over last 24 hours). Abdominal x-ray appears slightly improved but air-fluid levels still present. Will continue conservative therapy. - Continue with NGT - NS with KCl 40 mEq @ 125 ml/hr for 12 hours then will switch to NS @ 125 ml/hr - Continue morphine as needed for pain - Continue Zofran as needed for nausea - Appreciate General Surgery's recommendations.   Hypokalemia: K 3.2 this morning. She has a long-standing history of hypokalemia. However, she is not hypertensive so hyperaldosteronism seems less likely. She could possibly have a renal tubular acidosis (type 1 or 2). Can consider further work up when her partial SBO improves. 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  - Claritin 10 mg daily through NG tube   Diet: NPO DVT PPx: Lovenox SQ Dispo: Anticipated discharge in approximately 2-4 day(s).   LOS: 2 days   Juliet Rude, MD 11/19/2015, 7:30 AM Pager: [phone]

## 2015-11-20 ENCOUNTER — Inpatient Hospital Stay (HOSPITAL_COMMUNITY): Payer: Self-pay

## 2015-11-20 LAB — CBC
HCT: 28.7 % — ABNORMAL LOW (ref 36.0–46.0)
HEMOGLOBIN: 9.2 g/dL — AB (ref 12.0–15.0)
MCH: 27.5 pg (ref 26.0–34.0)
MCHC: 32.1 g/dL (ref 30.0–36.0)
MCV: 85.9 fL (ref 78.0–100.0)
PLATELETS: 275 10*3/uL (ref 150–400)
RBC: 3.34 MIL/uL — AB (ref 3.87–5.11)
RDW: 14 % (ref 11.5–15.5)
WBC: 7.7 10*3/uL (ref 4.0–10.5)

## 2015-11-20 LAB — BASIC METABOLIC PANEL
ANION GAP: 5 (ref 5–15)
BUN: 8 mg/dL (ref 6–20)
CHLORIDE: 111 mmol/L (ref 101–111)
CO2: 22 mmol/L (ref 22–32)
Calcium: 7.7 mg/dL — ABNORMAL LOW (ref 8.9–10.3)
Creatinine, Ser: 0.62 mg/dL (ref 0.44–1.00)
Glucose, Bld: 66 mg/dL (ref 65–99)
POTASSIUM: 3.9 mmol/L (ref 3.5–5.1)
SODIUM: 138 mmol/L (ref 135–145)

## 2015-11-20 NOTE — Progress Notes (Signed)
Offered pt. A bath and pt. Stated not right now. Inform the pt. If she changed her mind to let me know . Set up bathroom for pt.

## 2015-11-20 NOTE — Progress Notes (Signed)
Patient complain of having upset stomach and pain. Placed back to wall suction.

## 2015-11-20 NOTE — Progress Notes (Signed)
   Subjective: Ms. Praytor was evaluated this morning on rounds.  She was sitting up conversing with a friend at the bedside.  She appeared comfortable.  She has been able to ambulate without difficulty.  She reports abdominal pain with improvement.  She reports some pain in her face due to the partial removal of the NG tube.  She continues to have nausea with benefit from zofran.  She states her abdominal pain is been well managed.  She denies vomiting, SOB or chest pain.   Objective: Vital signs in last 24 hours: Filed Vitals:   11/19/15 0500 11/19/15 1300 11/19/15 2156 11/20/15 0540  BP: 133/74 111/75 112/74 112/69  Pulse: 103 88 107   Temp: 98.2 F (36.8 C) 98.6 F (37 C) 99.9 F (37.7 C) 99.3 F (37.4 C)  TempSrc: Oral Oral Oral Oral  Resp: 19 18    Height:      Weight:      SpO2: 96% 99% 99% 100%   Physical Exam Physical Exam  Constitutional: She is oriented to person, place, and time.  Sitting up in bed in mild distress  HENT:  Head: Normocephalic and atraumatic.  Cardiovascular: Normal rate, regular rhythm and normal heart sounds.   Pulmonary/Chest: Effort normal and breath sounds normal. No respiratory distress.  Abdominal: Soft. There is tenderness.  Bowel sounds hypoactive in all 4 quadrants  Musculoskeletal: She exhibits no edema.  Neurological: She is alert and oriented to person, place, and time.  Skin: Skin is warm and dry.     Assessment/Plan: Partial bowel obstruction ([place]) Surgery moved NG tube yesterday and plans on clamping the NG tube today and giving her some clear fluids.  She was able to ambulate without difficulty yesterday and continues to ambulate in the room.     - Anticipate NG clamp - holding IV fluids to see how patient tolerates clear fluids - Continue morphine as needed for pain - Continue Zofran - continue to ambulate  Hypokalemia K is 3.9 up from 3.2.  Likely due to her vomiting and diarrhea before admission.  Patient has a history of  long standing hypokalemia and may need to do further workup for other causes of hypokalemia such as primary hyperaldosteronism.  May also need to assess if hypokalemia was present before Lap band procedure as electrolyte imbalances occur after bariatric surgeries. -BMET in am  Dispo: Anticipated discharge in approximately 2 day(s).   LOS: 3 days   Valinda Party, DO 11/20/2015, 11:23 AM Pager: [phone]

## 2015-11-20 NOTE — Progress Notes (Signed)
  Subjective: Abdomen is soft, + BS, NG drainage now looks like ice chips.  She is worried about the NG coming out but is otherwise doing well.    Objective: Vital signs in last 24 hours: Temp:  [98.6 F (37 C)-99.9 F (37.7 C)] 99.3 F (37.4 C) (07/10 0540) Pulse Rate:  [88-107] 107 (07/09 2156) Resp:  [18] 18 (07/09 1300) BP: (111-112)/(69-75) 112/69 mmHg (07/10 0540) SpO2:  [99 %-100 %] 100 % (07/10 0540) Last BM Date: 11/17/15 800 from NG recorded Voided x 2 recorded Afebrile, VSS WBC down to normal   Film continues to improve Intake/Output from previous day: 07/09 0701 - 07/10 0700 In: 970.8 [I.V.:970.8] Out: 800 [Emesis/NG output:800] Intake/Output this shift:    General appearance: alert, cooperative and no distress Resp: clear to auscultation bilaterally GI: soft, non-tender; bowel sounds normal; no masses,  no organomegaly  Lab Results:   Recent Labs  11/19/15 0415 11/20/15 0454  WBC 13.0* 7.7  HGB 10.9* 9.2*  HCT 33.6* 28.7*  PLT 335 275    BMET  Recent Labs  11/19/15 0415 11/20/15 0454  NA 137 138  K 3.2* 3.9  CL 109 111  CO2 24 22  GLUCOSE 94 66  BUN 12 8  CREATININE 0.66 0.62  CALCIUM 7.5* 7.7*   PT/INR No results for input(s): LABPROT, INR in the last 72 hours.   Recent Labs Lab 11/17/15 0450  AST 20  ALT 10*  ALKPHOS 49  BILITOT 0.3  PROT 6.8  ALBUMIN 3.4*     Lipase     Component Value Date/Time   LIPASE 47 11/17/2015 0450     Studies/Results: Dg Abd 1 View  11/19/2015  CLINICAL DATA:  Gastroenteritis EXAM: ABDOMEN - 1 VIEW COMPARISON:  November 18, 2015 FINDINGS: Nasogastric tube tip and side port are in the stomach. There is fairly diffuse stool throughout the colon. Bowel is no longer dilated. There are no appreciable air-fluid levels. No free air is apparent on this supine examination. There is a lap band at the level of the gastric cardia. There are surgical clips in the right lower quadrant of the abdomen. IMPRESSION:  Nasogastric tube tip and side port in distal stomach region. The nasogastric tube tip may actually be located at the pylorus -duodenum junction. Bowel gas pattern is currently unremarkable without obstruction or free air. There is fairly diffuse stool throughout the colon. Lap band is located at the gastric cardia. Electronically Signed   By: Lowella Grip III M.D.   On: 11/19/2015 08:48    Medications: . antiseptic oral rinse  7 mL Mouth Rinse q12n4p  . chlorhexidine  15 mL Mouth Rinse BID  . enoxaparin (LOVENOX) injection  40 mg Subcutaneous Daily  . loratadine  10 mg Per NG tube Daily    Assessment/Plan Significant gastric distension with Lap Band in place Gastroenteritis Elevated WBC and low grade fever Hypokalemia- resolved   Hyperglycemia FEN: NPO/IV fluids ID: None DVT: Lovenox    Plan:  Clamp NG, give her some clears from the floor and see how she does.  If she does well pull NG later today.       LOS: 3 days    Colonel Krauser 11/20/2015 [phone]

## 2015-11-21 ENCOUNTER — Encounter (HOSPITAL_COMMUNITY): Payer: Self-pay | Admitting: Radiology

## 2015-11-21 ENCOUNTER — Inpatient Hospital Stay (HOSPITAL_COMMUNITY): Payer: Self-pay

## 2015-11-21 LAB — BASIC METABOLIC PANEL
Anion gap: 11 (ref 5–15)
BUN: 5 mg/dL — AB (ref 6–20)
CHLORIDE: 100 mmol/L — AB (ref 101–111)
CO2: 25 mmol/L (ref 22–32)
CREATININE: 0.74 mg/dL (ref 0.44–1.00)
Calcium: 8.4 mg/dL — ABNORMAL LOW (ref 8.9–10.3)
GFR calc Af Amer: >60 mL/min (ref >60)
GLUCOSE: 81 mg/dL (ref 65–99)
POTASSIUM: 3.1 mmol/L — AB (ref 3.5–5.1)
Sodium: 136 mmol/L (ref 135–145)

## 2015-11-21 LAB — MAGNESIUM: MAGNESIUM: 1.6 mg/dL — AB (ref 1.7–2.4)

## 2015-11-21 MED ORDER — POTASSIUM CHLORIDE IN NACL 40-0.9 MEQ/L-% IV SOLN
INTRAVENOUS | Status: AC
  Administered 2015-11-21: 125 mL/h via INTRAVENOUS
  Filled 2015-11-21 (×2): qty 1000

## 2015-11-21 MED ORDER — DIATRIZOATE MEGLUMINE & SODIUM 66-10 % PO SOLN
ORAL | Status: AC
  Administered 2015-11-21: 15:00:00
  Filled 2015-11-21: qty 30

## 2015-11-21 MED ORDER — IOPAMIDOL (ISOVUE-300) INJECTION 61%
INTRAVENOUS | Status: AC
  Administered 2015-11-21: 100 mL
  Filled 2015-11-21: qty 100

## 2015-11-21 MED ORDER — MAGNESIUM SULFATE 2 GM/50ML IV SOLN
2.0000 g | Freq: Once | INTRAVENOUS | Status: AC
  Administered 2015-11-22: 2 g via INTRAVENOUS
  Filled 2015-11-21: qty 50

## 2015-11-21 MED ORDER — MAGNESIUM SULFATE 2 GM/50ML IV SOLN
2.0000 g | Freq: Once | INTRAVENOUS | Status: AC
  Administered 2015-11-21: 2 g via INTRAVENOUS
  Filled 2015-11-21: qty 50

## 2015-11-21 NOTE — Progress Notes (Signed)
[facility] Progress Note     Subjective: Patient reports distention, nausea, and abdominal pain yesterday evening, improved after NG was placed back on LIS around 6:30 pm. Patient reports drinking a large amount of liquid yesterday and thinks that she over did it. This morning her pain is 7/10 and controlled using medications. +flatus. No BM. Urinating without hesitancy. Ambulating.   NGT 24h OP: 1,950 cc bilious   Objective: Vital signs in last 24 hours: Temp:  [98.5 F (36.9 C)-99.7 F (37.6 C)] 98.9 F (37.2 C) (07/11 0553) Pulse Rate:  [88-106] 88 (07/11 0553) Resp:  [18-19] 19 (07/11 0553) BP: (118-126)/(70-74) 118/70 mmHg (07/11 0553) SpO2:  [99 %-100 %] 99 % (07/11 0553) Last BM Date: 11/20/15  Intake/Output from previous day: 07/10 0701 - 07/11 0700 In: 480 [P.O.:480] Out: 1950 [Emesis/NG output:1950] Intake/Output this shift:   PE: Gen:  Alert, NAD, pleasant Card:  RRR, no M/G/R heard Pulm:  CTA, no W/R/R Abd: Soft, mildly TTP, ND, +BS  Lab Results:   Recent Labs  11/19/15 0415 11/20/15 0454  WBC 13.0* 7.7  HGB 10.9* 9.2*  HCT 33.6* 28.7*  PLT 335 275   BMET  Recent Labs  11/20/15 0454 11/21/15 0457  NA 138 136  K 3.9 3.1*  CL 111 100*  CO2 22 25  GLUCOSE 66 81  BUN 8 5*  CREATININE 0.62 0.74  CALCIUM 7.7* 8.4*   PT/INR No results for input(s): LABPROT, INR in the last 72 hours. CMP     Component Value Date/Time   NA 136 11/21/2015 0457   NA 140 06/27/2015 1631   K 3.1* 11/21/2015 0457   CL 100* 11/21/2015 0457   CO2 25 11/21/2015 0457   GLUCOSE 81 11/21/2015 0457   GLUCOSE 73 06/27/2015 1631   BUN 5* 11/21/2015 0457   BUN 13 06/27/2015 1631   CREATININE 0.74 11/21/2015 0457   CREATININE 0.75 07/03/2013 1003   CALCIUM 8.4* 11/21/2015 0457   PROT 6.8 11/17/2015 0450   PROT 7.2 06/27/2015 1631   ALBUMIN 3.4* 11/17/2015 0450   ALBUMIN 4.1 06/27/2015 1631   AST 20 11/17/2015 0450   ALT 10* 11/17/2015 0450   ALKPHOS  49 11/17/2015 0450   BILITOT 0.3 11/17/2015 0450   BILITOT 0.3 06/27/2015 1631   GFRNONAA >60 11/21/2015 0457   GFRNONAA >89 07/03/2013 1003   GFRAA >60 11/21/2015 0457   GFRAA >89 07/03/2013 1003   Lipase     Component Value Date/Time   LIPASE 47 11/17/2015 0450   Studies/Results: Dg Abd 1 View  11/20/2015  CLINICAL DATA:  Severe abdominal pain, small-bowel obstruction, nasogastric tube in place,, history of adjustable gastric band placement. EXAM: ABDOMEN - 1 VIEW COMPARISON:  Abdominal radiographs of November 19, 2015 FINDINGS: There has been interval decrease in the volume of small bowel gas present. There is some gas in the stomach which contains the nasogastric tube whose tip lies in the region of the pylorus. The gastric band device is in stable position. The stool and gas pattern within the colon and rectum appears normal. The bony structures exhibit no acute abnormalities. There are surgical clips projecting over the right iliac crest from previous surgery for perforated appendicitis in March of 2016. IMPRESSION: Further interval improvement in the appearance of the small bowel. Only a small amount of mildly distended gas-filled small bowel persists in the right upper abdomen. Electronically Signed   By: David  Martinique M.D.   On: 11/20/2015 07:43    Anti-infectives:  Anti-infectives    None     Assessment/Plan Significant gastric distension with Lap Band in place Gastroenteritis Leukocytosis - resolved Hypokalemia- 3.1 this AM, medicine restarting [place] w/ KCl Hyperglycemia  FEN: NGT clamping trial, clear liquids  ID: None DVT: Lovenox  Plan: try another clamping trial today with clear liquids - patient informed to take it slow, she does not need to finish entire clear liquid food tray.    LOS: 4 days    [facility] 11/21/2015, 8:25 AM Pager: [phone] Consults: [phone] Mon-Fri 7:00 am-4:30 pm Sat-Sun 7:00 am-11:30  am

## 2015-11-21 NOTE — Progress Notes (Signed)
   Subjective: Meagan Dean was evaluated this morning on rounds.  Her NG was clamped yesterday and she tried a clear liquid diet.  She reported feeling stomach pain and distension after her diet, similar to the pain she was having when she was admitted.  She did not vomit after the meal and has had a few bowel movements yesterday but none today. She reports being able to pass gas today.  Her NG tube was restarted and she stated she felt immediate relief.  She is able to ambulate.  She denies SOB, chest pain, vomiting.  Objective: Vital signs in last 24 hours: Filed Vitals:   11/20/15 0540 11/20/15 1358 11/20/15 2140 11/21/15 0553  BP: 112/69 126/74 120/74 118/70  Pulse:  95 106 88  Temp: 99.3 F (37.4 C) 98.5 F (36.9 C) 99.7 F (37.6 C) 98.9 F (37.2 C)  TempSrc: Oral Oral Oral Oral  Resp:  18 18 19   Height:      Weight:      SpO2: 100% 100% 100% 99%   Physical Exam Physical Exam  Constitutional: She is oriented to person, place, and time.  Mildly uncomfortable laying in bed  HENT:  Head: Normocephalic and atraumatic.  Cardiovascular: Normal rate, regular rhythm and normal heart sounds.   Pulmonary/Chest: Effort normal and breath sounds normal. No respiratory distress.  Abdominal: She exhibits distension. There is tenderness.  No bowel sounds were appreciated except for one high pitched bowel sound.  Musculoskeletal: She exhibits no edema.  Neurological: She is alert and oriented to person, place, and time.  Skin: Skin is warm and dry.  Psychiatric: Mood and affect normal.     Assessment/Plan: Partial bowel obstruction ([place]) Surgery clamped the NG tube yesterday and patient was given a clear liquid diet. After her meal her abdomen became distended and began to have pain.  Her NG tube was restarted.  Patient has received morphine for her abdominal pain during admission and has been requiring less throughout her stay.  Surgery has recommended another clamping trial with clear  liquids.    - Restart NS with KCl 40 mEq @ 125 ml/hr for 12 hours - Continue morphine as needed for pain 2mg  Q4PRN - Continue Zofran - Continue to ambulate   Hypokalemia K is 3.1.  Mag level this morning was low at 1.6 - Restart NS with KCl 40 mEq @ 125 ml/hr for 12 hours - Magnesium 2g once today and once in the am.  Dispo: Anticipated discharge in approximately 3 day(s).   LOS: 4 days   Valinda Party, DO 11/21/2015, 10:23 AM Pager: [phone]

## 2015-11-22 ENCOUNTER — Inpatient Hospital Stay (HOSPITAL_COMMUNITY): Payer: Self-pay

## 2015-11-22 LAB — BASIC METABOLIC PANEL
Anion gap: 8 (ref 5–15)
BUN: 6 mg/dL (ref 6–20)
CALCIUM: 8.2 mg/dL — AB (ref 8.9–10.3)
CO2: 25 mmol/L (ref 22–32)
CREATININE: 0.7 mg/dL (ref 0.44–1.00)
Chloride: 103 mmol/L (ref 101–111)
GFR calc Af Amer: >60 mL/min (ref >60)
GLUCOSE: 69 mg/dL (ref 65–99)
Potassium: 3.3 mmol/L — ABNORMAL LOW (ref 3.5–5.1)
Sodium: 136 mmol/L (ref 135–145)

## 2015-11-22 MED ORDER — POTASSIUM CHLORIDE IN NACL 40-0.9 MEQ/L-% IV SOLN
INTRAVENOUS | Status: AC
Start: 2015-11-22 — End: 2015-11-23
  Administered 2015-11-22: 125 mL/h via INTRAVENOUS
  Filled 2015-11-22 (×2): qty 1000

## 2015-11-22 MED ORDER — PHENOL 1.4 % MT LIQD
1.0000 | OROMUCOSAL | Status: DC | PRN
  Filled 2015-11-22: qty 177

## 2015-11-22 NOTE — Progress Notes (Signed)
   Subjective: Meagan Dean was evaluated this morning on rounds.  She stated she had a "bad night" She had another NG clamp trial yesterday and tried some clear liquids.  She developed abdominal pain and distension.  She denied vomiting.  A CT scan was done yesterday and after returning to her room her NG tube suction was restarted because of continuing abdominal pain.  She reported immediate relief from the NG suction.  In the room the NG canister had bilious emesis. Patient had several episodes of diarrhea and states she has been able to pass gas.  Patient denies SOB, chest pain, lower leg swelling.  Objective: Vital signs in last 24 hours: Filed Vitals:   11/21/15 0553 11/21/15 1419 11/21/15 2113 11/22/15 0553  BP: 118/70 103/58 113/69 105/69  Pulse: 88 109 100 87  Temp: 98.9 F (37.2 C) 100.1 F (37.8 C) 98.9 F (37.2 C) 98.6 F (37 C)  TempSrc: Oral Oral Oral Oral  Resp: 19 18 18 18   Height:      Weight:      SpO2: 99% 100% 97% 97%   Physical Exam Physical Exam  Constitutional: She is oriented to person, place, and time.  Mild discomfort laying in bed  HENT:  Head: Normocephalic and atraumatic.  Cardiovascular: Normal rate, regular rhythm and normal heart sounds.   Pulmonary/Chest: Effort normal and breath sounds normal. No respiratory distress.  Abdominal: She exhibits distension. There is tenderness.  No bowel sounds present  Musculoskeletal: She exhibits no edema.  Neurological: She is alert and oriented to person, place, and time.  Skin: Skin is warm.  Psychiatric: Mood and affect normal.     Assessment/Plan: Partial bowel obstruction ([place]) Patient had NG tube clamped yesterday and after trying some clear fluids the NG suction was restarted due to increased abdominal pain.  Abdominal CT and findings are highly suspicious for small bowel obstruction.  There was no contrast material noted within distal small bowel or within colon.  Patient may need surgery after 2 failed  attempts with NG clamping and persistent abdominal pain and distension. - Awaiting surgical decision - NS with KCl 40 mEq @ 125 ml/hr for 12 hours - Continue morphine as needed for pain 2mg  Q4PRN - Continue Zofran - Continue to ambulate  Hypokalemia Patients K is currently 3.3 - NS with KCl 40 mEq @ 125 ml/hr for 12 hours -BMET am      Dispo: Anticipated discharge in approximately 2 day(s).   LOS: 5 days   Valinda Party, DO 11/22/2015, 12:54 PM Pager: [phone]

## 2015-11-22 NOTE — Progress Notes (Signed)
Pt's cheek/neck behind the ear are noted to be slightly swollen on both sides but more to the right side than left. Pt can swallow ok. VSS. O2 sat is 100% on RA. C/o of sore throat more because of NG tube. Notified on call (Dr.Sullivan). No new orders at this time. Will continue to monitor.

## 2015-11-22 NOTE — Progress Notes (Signed)
[facility] Progress Note     Subjective: CT scan of abdomen yesterday significant for SBO with transition zone. After contrast through NG, pt had ~3 low-volume, loose BMs. Patient reports no relief of abdominal discomfort or distention afterwards. Still feels "full" and asked the nurse to hook her NGT back up to suction. Pain minimally improved with pain medication, worse with movement. No BM or flatus yet this AM. Ambulating. Urinating. Discussed the CT scan results and possibility of surgery with the patient in detail.   NG cannister in room completely full of clear/green fluid.   Objective: Vital signs in last 24 hours: Temp:  [98.6 F (37 C)-100.1 F (37.8 C)] 98.6 F (37 C) (07/12 0553) Pulse Rate:  [87-109] 87 (07/12 0553) Resp:  [18] 18 (07/12 0553) BP: (103-113)/(58-69) 105/69 mmHg (07/12 0553) SpO2:  [97 %-100 %] 97 % (07/12 0553) Last BM Date: 11/20/15  Intake/Output from previous day: 07/11 0701 - 07/12 0700 In: 1317.5 [P.O.:240; I.V.:437.5; NG/GT:590; IV Piggyback:50] Out: 950 [Emesis/NG output:950] Intake/Output this shift:    PE: Gen:  Alert, NAD, pleasant Pulm:  CTA, no W/R/R Abd: Soft, mildly TTP in all 4 quadrants with worse pain in lower abdomen, hypoactive BS, lap band palpable in RUQ, no peritonitis or guarding.  Ext:  No erythema, edema, or tenderness   Lab Results:   Recent Labs  11/20/15 0454  WBC 7.7  HGB 9.2*  HCT 28.7*  PLT 275   BMET  Recent Labs  11/21/15 0457 11/22/15 0727  NA 136 136  K 3.1* 3.3*  CL 100* 103  CO2 25 25  GLUCOSE 81 69  BUN 5* 6  CREATININE 0.74 0.70  CALCIUM 8.4* 8.2*   PT/INR No results for input(s): LABPROT, INR in the last 72 hours. CMP     Component Value Date/Time   NA 136 11/22/2015 0727   NA 140 06/27/2015 1631   K 3.3* 11/22/2015 0727   CL 103 11/22/2015 0727   CO2 25 11/22/2015 0727   GLUCOSE 69 11/22/2015 0727   GLUCOSE 73 06/27/2015 1631   BUN 6 11/22/2015 0727   BUN 13  06/27/2015 1631   CREATININE 0.70 11/22/2015 0727   CREATININE 0.75 07/03/2013 1003   CALCIUM 8.2* 11/22/2015 0727   PROT 6.8 11/17/2015 0450   PROT 7.2 06/27/2015 1631   ALBUMIN 3.4* 11/17/2015 0450   ALBUMIN 4.1 06/27/2015 1631   AST 20 11/17/2015 0450   ALT 10* 11/17/2015 0450   ALKPHOS 49 11/17/2015 0450   BILITOT 0.3 11/17/2015 0450   BILITOT 0.3 06/27/2015 1631   GFRNONAA >60 11/22/2015 0727   GFRNONAA >89 07/03/2013 1003   GFRAA >60 11/22/2015 0727   GFRAA >89 07/03/2013 1003   Lipase     Component Value Date/Time   LIPASE 47 11/17/2015 0450   Studies/Results: Ct Abdomen Pelvis W Contrast  11/21/2015  CLINICAL DATA:  Possible partial small bowel obstruction EXAM: CT ABDOMEN AND PELVIS WITH CONTRAST TECHNIQUE: Multidetector CT imaging of the abdomen and pelvis was performed using the standard protocol following bolus administration of intravenous contrast. CONTRAST:  128mL ISOVUE-300 IOPAMIDOL (ISOVUE-300) INJECTION 61% COMPARISON:  08/01/2014 FINDINGS: Lower chest: The lung bases shows small bilateral pleural effusion. There is small atelectasis or infiltrate in right base posteriorly. Mild thickening of distal esophageal wall. There is NG tube in place with tip within mid stomach. Hepatobiliary: Enhanced liver shows no biliary ductal dilatation. Small perihepatic ascites. Gallbladder is contracted without evidence of calcified gallstones. Pancreas: Enhanced pancreas is  unremarkable. No evidence of pancreatitis. Spleen: Enhanced spleen is unremarkable. Adrenals/Urinary Tract: No adrenal gland mass. Enhanced kidneys are symmetrical in size. No hydronephrosis or hydroureter. Delayed renal images shows bilateral renal symmetrical excretion. Bilateral visualized proximal ureter is unremarkable. Stomach/Bowel: There is moderate gastric distension with gas and some oral contrast material. No evidence of gastric outlet obstruction. There is a gastric band noted in proximal stomach. Oral  contrast material noted within proximal small bowel loops. There is moderate small bowel distension with multiple air-fluid levels. Fluid distended mid small bowel loops are noted with air-fluid levels. In axial image 62 there is transition point in caliber of small bowel. Findings are highly suspicious for small bowel obstruction. Most distal terminal small bowel is small caliber decompressed. Subtle mild thickening of small bowel wall in distal small bowel in right lower quadrant please see axial image 58. Mild small bowel wall edema cannot be excluded. Some liquid stool noted in right colon. Vascular/Lymphatic: No retroperitoneal or mesenteric adenopathy. No aortic aneurysm. Reproductive: Mild anteflexed uterus. Question anterior wall fibroid measures 2.4 cm. Other: Abundant pelvic ascites is noted. No adnexal masses noted. There is small umbilical hernia containing omental fat without evidence of acute complication measures 3 cm by 1.3 cm. Please see sagittal image 84. No free abdominal air. Musculoskeletal: No destructive bony lesions are noted. Minimal degenerative changes thoracolumbar spine. IMPRESSION: 1. There is moderate gastric distension with oral contrast material and gas without evidence of gastric outlet obstruction. NG tube in place. 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 2. There are distended small bowel loops with air-fluid levels. There is transition in caliber of small bowel in mid lower abdomen axial image 62. Distal small bowel loops are decompressed small caliber. Findings are highly suspicious for small bowel obstruction. No any contrast material noted within distal small bowel or within colon. 3. There is no evidence of free abdominal air. Abundant pelvic ascites. 4. Subtle mild thickening of the wall of distal small bowel loops in right lower abdomen mild small bowel wall edema cannot be excluded. 5. Normal size anteflexed uterus. Question anterior wall uterine fibroid measures 2.4 cm please see  sagittal image 73. Further correlation with pelvic ultrasound and GYN exam could be performed. Electronically Signed   By: Lahoma Crocker M.D.   On: 11/21/2015 16:47   Anti-infectives: Anti-infectives    None     Assessment/Plan Significant gastric distension with Lap Band in place Gastroenteritis - clinically not improving - Abd films ordered for this AM  Leukocytosis - resolved Hypokalemia- 3.3 this AM, per medicine Hyperglycemia  FEN: NGT clamping trial, clear liquids  ID: None DVT: Lovenox, SCD's  Plan: NPO, bowel rest, keep NG to intermittent suction. check abdominal films this AM.  Will discuss continuation of non-operative management vs exploratory laparotomy with Dr. Grandville Silos. Patient has failed 2 NGT clamping trials and has persistent N/V and abdominal distention/pain, suspect she will need surgical exploration.     LOS: 5 days    Jill Alexanders , [facility] 11/22/2015, 8:44 AM Pager: [phone] Consults: [phone] Mon-Fri 7:00 am-4:30 pm Sat-Sun 7:00 am-11:30 am

## 2015-11-23 ENCOUNTER — Inpatient Hospital Stay (HOSPITAL_COMMUNITY): Payer: Self-pay

## 2015-11-23 DIAGNOSIS — E162 Hypoglycemia, unspecified: Secondary | ICD-10-CM

## 2015-11-23 LAB — BASIC METABOLIC PANEL
ANION GAP: 11 (ref 5–15)
BUN: 7 mg/dL (ref 6–20)
CO2: 17 mmol/L — AB (ref 22–32)
Calcium: 8.1 mg/dL — ABNORMAL LOW (ref 8.9–10.3)
Chloride: 107 mmol/L (ref 101–111)
Creatinine, Ser: 0.73 mg/dL (ref 0.44–1.00)
GFR calc non Af Amer: >60 mL/min (ref >60)
GLUCOSE: 51 mg/dL — AB (ref 65–99)
POTASSIUM: 4.4 mmol/L (ref 3.5–5.1)
Sodium: 135 mmol/L (ref 135–145)

## 2015-11-23 LAB — MAGNESIUM: Magnesium: 1.7 mg/dL (ref 1.7–2.4)

## 2015-11-23 MED ORDER — MAGNESIUM SULFATE 2 GM/50ML IV SOLN
2.0000 g | Freq: Once | INTRAVENOUS | Status: AC
  Administered 2015-11-23: 2 g via INTRAVENOUS
  Filled 2015-11-23: qty 50

## 2015-11-23 MED ORDER — POTASSIUM CHLORIDE IN NACL 20-0.45 MEQ/L-% IV SOLN
INTRAVENOUS | Status: DC
  Administered 2015-11-24: 02:00:00 via INTRAVENOUS
  Filled 2015-11-23 (×2): qty 1000

## 2015-11-23 MED ORDER — DEXTROSE-NACL 5-0.45 % IV SOLN
INTRAVENOUS | Status: AC
  Administered 2015-11-23: 13:00:00 via INTRAVENOUS

## 2015-11-23 NOTE — Progress Notes (Signed)
[facility] Progress Note     Subjective: Laying in bed. Pain unchanged. One severe episode of sharp abdominal pain yesterday making her want to "pull my knees up", that eventually resolved after a few minutes and some ice chips. Reports 2 episodes of flatus in 24h. No BM. Urinating without hesitancy. Denies nausea. Complaining of dry mouth and throat irritation from NGT, however is unsure if she is ready to have it removed.  24h NGT OP: 1,950 cc; was drinking clears yesterday prior to replacing NGT to suction; OP is clear.   Objective: Vital signs in last 24 hours: Temp:  [97.9 F (36.6 C)-99.7 F (37.6 C)] 98.6 F (37 C) (07/13 0439) Pulse Rate:  [85-97] 85 (07/13 0439) Resp:  [18-19] 19 (07/13 0439) BP: (105-113)/(62-67) 105/62 mmHg (07/13 0439) SpO2:  [100 %] 100 % (07/13 0439) Last BM Date: 11/21/15  Intake/Output from previous day: 07/12 0701 - 07/13 0700 In: 2580 [P.O.:950; I.V.:1550; NG/GT:30; IV Piggyback:50] Out: 2350 [Urine:400; Emesis/NG output:1950] Intake/Output this shift:    PE: Gen: Alert, NAD, pleasant HEENT: NG in right nare,  Pulm: CTA, no W/R/R Abd: Soft, TTP LUQ and LLQ, mild distention, high pitched BS, lap band palpable in RUQ, no peritonitis or guarding.  Ext: No erythema, edema, or tenderness   Lab Results:  No results for input(s): WBC, HGB, HCT, PLT in the last 72 hours. BMET  Recent Labs  11/22/15 0727 11/23/15 0527  NA 136 135  K 3.3* 4.4  CL 103 107  CO2 25 17*  GLUCOSE 69 51*  BUN 6 7  CREATININE 0.70 0.73  CALCIUM 8.2* 8.1*   PT/INR No results for input(s): LABPROT, INR in the last 72 hours. CMP     Component Value Date/Time   NA 135 11/23/2015 0527   NA 140 06/27/2015 1631   K 4.4 11/23/2015 0527   CL 107 11/23/2015 0527   CO2 17* 11/23/2015 0527   GLUCOSE 51* 11/23/2015 0527   GLUCOSE 73 06/27/2015 1631   BUN 7 11/23/2015 0527   BUN 13 06/27/2015 1631   CREATININE 0.73 11/23/2015 0527   CREATININE  0.75 07/03/2013 1003   CALCIUM 8.1* 11/23/2015 0527   PROT 6.8 11/17/2015 0450   PROT 7.2 06/27/2015 1631   ALBUMIN 3.4* 11/17/2015 0450   ALBUMIN 4.1 06/27/2015 1631   AST 20 11/17/2015 0450   ALT 10* 11/17/2015 0450   ALKPHOS 49 11/17/2015 0450   BILITOT 0.3 11/17/2015 0450   BILITOT 0.3 06/27/2015 1631   GFRNONAA >60 11/23/2015 0527   GFRNONAA >89 07/03/2013 1003   GFRAA >60 11/23/2015 0527   GFRAA >89 07/03/2013 1003   Lipase     Component Value Date/Time   LIPASE 47 11/17/2015 0450       Studies/Results: Ct Abdomen Pelvis W Contrast  11/21/2015  CLINICAL DATA:  Possible partial small bowel obstruction EXAM: CT ABDOMEN AND PELVIS WITH CONTRAST TECHNIQUE: Multidetector CT imaging of the abdomen and pelvis was performed using the standard protocol following bolus administration of intravenous contrast. CONTRAST:  13mL ISOVUE-300 IOPAMIDOL (ISOVUE-300) INJECTION 61% COMPARISON:  08/01/2014 FINDINGS: Lower chest: The lung bases shows small bilateral pleural effusion. There is small atelectasis or infiltrate in right base posteriorly. Mild thickening of distal esophageal wall. There is NG tube in place with tip within mid stomach. Hepatobiliary: Enhanced liver shows no biliary ductal dilatation. Small perihepatic ascites. Gallbladder is contracted without evidence of calcified gallstones. Pancreas: Enhanced pancreas is unremarkable. No evidence of pancreatitis. Spleen: Enhanced spleen is  unremarkable. Adrenals/Urinary Tract: No adrenal gland mass. Enhanced kidneys are symmetrical in size. No hydronephrosis or hydroureter. Delayed renal images shows bilateral renal symmetrical excretion. Bilateral visualized proximal ureter is unremarkable. Stomach/Bowel: There is moderate gastric distension with gas and some oral contrast material. No evidence of gastric outlet obstruction. There is a gastric band noted in proximal stomach. Oral contrast material noted within proximal small bowel loops.  There is moderate small bowel distension with multiple air-fluid levels. Fluid distended mid small bowel loops are noted with air-fluid levels. In axial image 62 there is transition point in caliber of small bowel. Findings are highly suspicious for small bowel obstruction. Most distal terminal small bowel is small caliber decompressed. Subtle mild thickening of small bowel wall in distal small bowel in right lower quadrant please see axial image 58. Mild small bowel wall edema cannot be excluded. Some liquid stool noted in right colon. Vascular/Lymphatic: No retroperitoneal or mesenteric adenopathy. No aortic aneurysm. Reproductive: Mild anteflexed uterus. Question anterior wall fibroid measures 2.4 cm. Other: Abundant pelvic ascites is noted. No adnexal masses noted. There is small umbilical hernia containing omental fat without evidence of acute complication measures 3 cm by 1.3 cm. Please see sagittal image 84. No free abdominal air. Musculoskeletal: No destructive bony lesions are noted. Minimal degenerative changes thoracolumbar spine. IMPRESSION: 1. There is moderate gastric distension with oral contrast material and gas without evidence of gastric outlet obstruction. NG tube in place. 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 2. There are distended small bowel loops with air-fluid levels. There is transition in caliber of small bowel in mid lower abdomen axial image 62. Distal small bowel loops are decompressed small caliber. Findings are highly suspicious for small bowel obstruction. No any contrast material noted within distal small bowel or within colon. 3. There is no evidence of free abdominal air. Abundant pelvic ascites. 4. Subtle mild thickening of the wall of distal small bowel loops in right lower abdomen mild small bowel wall edema cannot be excluded. 5. Normal size anteflexed uterus. Question anterior wall uterine fibroid measures 2.4 cm please see sagittal image 73. Further correlation with pelvic ultrasound  and GYN exam could be performed. Electronically Signed   By: Lahoma Crocker M.D.   On: 11/21/2015 16:47   Dg Abd Portable 1v  11/22/2015  CLINICAL DATA:  Small bowel obstruction EXAM: PORTABLE ABDOMEN - 1 VIEW COMPARISON:  11/20/2015 and 11/21/2015 FINDINGS: NG tube in place with tip in distal stomach. Gastric band is unchanged in position. Mild gaseous distended small bowel loops in mid abdomen suspicious for ileus or partial bowel obstruction. Some contrast material noted within right colon and transverse colon. IMPRESSION: Mild gaseous distended small bowel loops mid abdomen suspicious for ileus or partial small bowel obstruction. Contrast material noted within right colon and transverse colon. NG tube in place. Electronically Signed   By: Lahoma Crocker M.D.   On: 11/22/2015 10:54    Anti-infectives: Anti-infectives    None     Assessment/Plan pSBO Significant gastric distension with Lap Band in place - clinically not improving  Leukocytosis - resolved Hypokalemia- resolved Hyperglycemia  ID: None DVT: Lovenox, SCD's  Plan: clinically the patient seems to be at a stand still - persistent pain, minimal flatus, no BM, high pitched bowel sounds. Will discuss continuation of conservative management vs surgery with Dr. Grandville Silos.    LOS: 6 days    Jill Alexanders , [facility] 11/23/2015, 8:15 AM Pager: [phone] Consults: [phone] Mon-Fri 7:00 am-4:30 pm  Sat-Sun 7:00 am-11:30 am

## 2015-11-23 NOTE — Progress Notes (Signed)
   Subjective: Meagan Dean was evaluated on rounds this morning.  She reports an improvement from yesterday but does not feel well in general.  Her NG tube continues to suction and has not been stopped since yesterday.  She still complains of abdominal pain with right sided sharp pain that comes and goes.  She has not had a bowel movement in the past 24 hours but has passed gas a couple times.  She also states she developed right sided cheek swelling that is painful.  Objective: Vital signs in last 24 hours: Filed Vitals:   11/22/15 0553 11/22/15 1355 11/22/15 2013 11/23/15 0439  BP: 105/69 105/67 113/65 105/62  Pulse: 87 89 97 85  Temp: 98.6 F (37 C) 97.9 F (36.6 C) 99.7 F (37.6 C) 98.6 F (37 C)  TempSrc: Oral Oral Oral Oral  Resp: 18 18 18 19   Height:      Weight:      SpO2: 97% 100% 100% 100%   Physical Exam Physical Exam  Constitutional: She is oriented to person, place, and time.  Mild distress sitting up in bed  HENT:  Head: Normocephalic and atraumatic.  Cardiovascular: Normal rate, regular rhythm and normal heart sounds.   Pulmonary/Chest: Effort normal and breath sounds normal. No respiratory distress.  Abdominal: She exhibits distension. There is tenderness.  No bowel sounds present  Musculoskeletal:  No bilateral lower leg swelling  Neurological: She is alert and oriented to person, place, and time.  Skin: Skin is warm and dry.  Right sided cheek swelling with mild erythema.  No swelling or erythema noted inside mouth.    Psychiatric: Mood and affect normal.     Assessment/Plan:   Partial bowel obstruction ([place]) Patient has had NG tube continue to suction in the past 24 hrs. She has endorsed abdominal pain that has not resolved since admission.  Will await surgery's recommendations for the partial small bowel obstruction.    Hypokalemia K is currently 4.4 will continue to monitor as patient has required potassium replacement several times during  admission -BMET am    Hypoglycemia Patients glucose is 51 and likely due to not being able to it eat solids or liquids in the past 6 days. - D5W 1/2NS     Dispo: Anticipated discharge in approximately 3 day(s).   LOS: 6 days   Valinda Party, DO 11/23/2015, 11:02 AM Pager: [phone]

## 2015-11-23 NOTE — Progress Notes (Signed)
Offered pt a bath and pt, stated she would do it a bit later. I set up everything pt. Needs. Let her know if she needs any help to let me know

## 2015-11-24 ENCOUNTER — Inpatient Hospital Stay (HOSPITAL_COMMUNITY): Payer: Self-pay | Admitting: Anesthesiology

## 2015-11-24 ENCOUNTER — Encounter (HOSPITAL_COMMUNITY)
Admission: EM | Disposition: A | Discharge status: Home / Self Care | Payer: Self-pay | Source: Home / Self Care | Attending: Internal Medicine

## 2015-11-24 ENCOUNTER — Encounter (HOSPITAL_COMMUNITY): Payer: Self-pay | Admitting: Certified Registered"

## 2015-11-24 ENCOUNTER — Inpatient Hospital Stay (HOSPITAL_COMMUNITY): Payer: MEDICAID | Admitting: Anesthesiology

## 2015-11-24 DIAGNOSIS — Z9889 Other specified postprocedural states: Secondary | ICD-10-CM

## 2015-11-24 HISTORY — PX: LAPAROTOMY: SHX154

## 2015-11-24 HISTORY — PX: LYSIS OF ADHESION: SHX5961

## 2015-11-24 LAB — TYPE AND SCREEN
ABO/RH(D): A POS
Antibody Screen: NEGATIVE

## 2015-11-24 LAB — CBC
HCT: 28.2 % — ABNORMAL LOW (ref 36.0–46.0)
Hemoglobin: 9.4 g/dL — ABNORMAL LOW (ref 12.0–15.0)
MCH: 27.6 pg (ref 26.0–34.0)
MCHC: 33.3 g/dL (ref 30.0–36.0)
MCV: 82.9 fL (ref 78.0–100.0)
PLATELETS: 374 10*3/uL (ref 150–400)
RBC: 3.4 MIL/uL — ABNORMAL LOW (ref 3.87–5.11)
RDW: 13.6 % (ref 11.5–15.5)
WBC: 5.7 10*3/uL (ref 4.0–10.5)

## 2015-11-24 LAB — SURGICAL PCR SCREEN
MRSA, PCR: NEGATIVE
Staphylococcus aureus: NEGATIVE

## 2015-11-24 LAB — BASIC METABOLIC PANEL
Anion gap: 12 (ref 5–15)
CO2: 21 mmol/L — ABNORMAL LOW (ref 22–32)
CREATININE: 0.66 mg/dL (ref 0.44–1.00)
Calcium: 8.5 mg/dL — ABNORMAL LOW (ref 8.9–10.3)
Chloride: 101 mmol/L (ref 101–111)
GFR calc Af Amer: >60 mL/min (ref >60)
GLUCOSE: 64 mg/dL — AB (ref 65–99)
Potassium: 3.7 mmol/L (ref 3.5–5.1)
SODIUM: 134 mmol/L — AB (ref 135–145)

## 2015-11-24 LAB — ABO/RH: ABO/RH(D): A POS

## 2015-11-24 SURGERY — LAPAROTOMY, EXPLORATORY
Anesthesia: General | Site: Abdomen

## 2015-11-24 MED ORDER — 0.9 % SODIUM CHLORIDE (POUR BTL) OPTIME
TOPICAL | Status: DC | PRN
  Administered 2015-11-24 (×3): 1000 mL

## 2015-11-24 MED ORDER — SODIUM CHLORIDE 0.9% FLUSH: 9.0000 mL | INTRAVENOUS | Status: DC | PRN

## 2015-11-24 MED ORDER — DIPHENHYDRAMINE HCL 50 MG/ML IJ SOLN: 12.5000 mg | Freq: Four times a day (QID) | INTRAMUSCULAR | Status: DC | PRN

## 2015-11-24 MED ORDER — HYDROMORPHONE HCL 1 MG/ML IJ SOLN
INTRAMUSCULAR | Status: DC | PRN
  Administered 2015-11-24 (×2): 0.5 mg via INTRAVENOUS

## 2015-11-24 MED ORDER — HYDROMORPHONE 1 MG/ML IV SOLN
INTRAVENOUS | Status: AC
  Filled 2015-11-24: qty 25

## 2015-11-24 MED ORDER — HYDROMORPHONE 1 MG/ML IV SOLN
INTRAVENOUS | Status: DC
Start: 2015-11-24 — End: 2015-11-28
  Administered 2015-11-24: 3.3 mg via INTRAVENOUS
  Administered 2015-11-24: 12:00:00 via INTRAVENOUS
  Administered 2015-11-24: 3 mg via INTRAVENOUS
  Administered 2015-11-25 (×3): 1.8 mg via INTRAVENOUS
  Administered 2015-11-25: 0.3 mg via INTRAVENOUS
  Administered 2015-11-25: 1.6 mg via INTRAVENOUS
  Administered 2015-11-25: 1.5 mg via INTRAVENOUS
  Administered 2015-11-26: 2 mg via INTRAVENOUS
  Administered 2015-11-26: 1.8 mg via INTRAVENOUS
  Administered 2015-11-26: 0.6 mg via INTRAVENOUS
  Administered 2015-11-26 (×2): 0.3 mg via INTRAVENOUS
  Administered 2015-11-27: 1.2 mg via INTRAVENOUS
  Administered 2015-11-27 (×2): 0 mg via INTRAVENOUS
  Administered 2015-11-27: 0.6 mg via INTRAVENOUS
  Filled 2015-11-24: qty 25

## 2015-11-24 MED ORDER — HYDROMORPHONE HCL 1 MG/ML IJ SOLN
INTRAMUSCULAR | Status: AC
  Administered 2015-11-24: 0.5 mg via INTRAVENOUS
  Filled 2015-11-24: qty 2

## 2015-11-24 MED ORDER — PROMETHAZINE HCL 25 MG/ML IJ SOLN: 6.2500 mg | INTRAMUSCULAR | Status: DC | PRN

## 2015-11-24 MED ORDER — ROCURONIUM BROMIDE 50 MG/5ML IV SOLN
INTRAVENOUS | Status: AC
  Filled 2015-11-24: qty 1

## 2015-11-24 MED ORDER — SUCCINYLCHOLINE CHLORIDE 20 MG/ML IJ SOLN
INTRAMUSCULAR | Status: DC | PRN
  Administered 2015-11-24: 100 mg via INTRAVENOUS

## 2015-11-24 MED ORDER — ESMOLOL HCL 100 MG/10ML IV SOLN
INTRAVENOUS | Status: DC | PRN
  Administered 2015-11-24: 20 mg via INTRAVENOUS

## 2015-11-24 MED ORDER — PHENYLEPHRINE HCL 10 MG/ML IJ SOLN
INTRAMUSCULAR | Status: DC | PRN
  Administered 2015-11-24: 160 ug via INTRAVENOUS

## 2015-11-24 MED ORDER — ENOXAPARIN SODIUM 40 MG/0.4ML ~~LOC~~ SOLN
40.0000 mg | SUBCUTANEOUS | Status: DC
  Administered 2015-11-25 – 2015-11-27 (×3): 40 mg via SUBCUTANEOUS
  Filled 2015-11-24 (×3): qty 0.4

## 2015-11-24 MED ORDER — PROPOFOL 10 MG/ML IV BOLUS
INTRAVENOUS | Status: DC | PRN
  Administered 2015-11-24: 200 mg via INTRAVENOUS

## 2015-11-24 MED ORDER — FENTANYL CITRATE (PF) 250 MCG/5ML IJ SOLN
INTRAMUSCULAR | Status: AC
  Filled 2015-11-24: qty 5

## 2015-11-24 MED ORDER — KCL IN DEXTROSE-NACL 10-5-0.45 MEQ/L-%-% IV SOLN
INTRAVENOUS | Status: AC
  Administered 2015-11-24: 07:00:00 via INTRAVENOUS
  Filled 2015-11-24 (×2): qty 1000

## 2015-11-24 MED ORDER — ROCURONIUM BROMIDE 100 MG/10ML IV SOLN
INTRAVENOUS | Status: DC | PRN
  Administered 2015-11-24: 10 mg via INTRAVENOUS
  Administered 2015-11-24: 20 mg via INTRAVENOUS
  Administered 2015-11-24: 40 mg via INTRAVENOUS

## 2015-11-24 MED ORDER — MIDAZOLAM HCL 2 MG/2ML IJ SOLN
INTRAMUSCULAR | Status: DC | PRN
  Administered 2015-11-24: 2 mg via INTRAVENOUS

## 2015-11-24 MED ORDER — CEFOXITIN SODIUM 2 G IV SOLR
2.0000 g | INTRAVENOUS | Status: AC
  Administered 2015-11-24: 2 g via INTRAVENOUS
  Filled 2015-11-24: qty 2

## 2015-11-24 MED ORDER — ONDANSETRON HCL 4 MG/2ML IJ SOLN
INTRAMUSCULAR | Status: AC
  Filled 2015-11-24: qty 2

## 2015-11-24 MED ORDER — ONDANSETRON HCL 4 MG/2ML IJ SOLN
4.0000 mg | Freq: Four times a day (QID) | INTRAMUSCULAR | Status: DC | PRN
Start: 2015-11-24 — End: 2015-11-28
  Administered 2015-11-25 – 2015-11-28 (×5): 4 mg via INTRAVENOUS
  Filled 2015-11-24 (×2): qty 2

## 2015-11-24 MED ORDER — LIDOCAINE 2% (20 MG/ML) 5 ML SYRINGE
INTRAMUSCULAR | Status: AC
  Filled 2015-11-24: qty 5

## 2015-11-24 MED ORDER — HYDROMORPHONE HCL 1 MG/ML IJ SOLN
INTRAMUSCULAR | Status: AC
  Filled 2015-11-24: qty 1

## 2015-11-24 MED ORDER — LIDOCAINE HCL (CARDIAC) 20 MG/ML IV SOLN
INTRAVENOUS | Status: DC | PRN
  Administered 2015-11-24: 80 mg via INTRAVENOUS

## 2015-11-24 MED ORDER — FENTANYL CITRATE (PF) 250 MCG/5ML IJ SOLN
INTRAMUSCULAR | Status: DC | PRN
  Administered 2015-11-24: 50 ug via INTRAVENOUS
  Administered 2015-11-24 (×2): 100 ug via INTRAVENOUS

## 2015-11-24 MED ORDER — SUGAMMADEX SODIUM 200 MG/2ML IV SOLN
INTRAVENOUS | Status: DC | PRN
  Administered 2015-11-24: 200 mg via INTRAVENOUS

## 2015-11-24 MED ORDER — ESMOLOL HCL 100 MG/10ML IV SOLN
INTRAVENOUS | Status: AC
  Filled 2015-11-24: qty 10

## 2015-11-24 MED ORDER — NALOXONE HCL 0.4 MG/ML IJ SOLN: 0.4000 mg | INTRAMUSCULAR | Status: DC | PRN

## 2015-11-24 MED ORDER — ONDANSETRON HCL 4 MG/2ML IJ SOLN
INTRAMUSCULAR | Status: DC | PRN
  Administered 2015-11-24: 4 mg via INTRAVENOUS

## 2015-11-24 MED ORDER — SUGAMMADEX SODIUM 200 MG/2ML IV SOLN
INTRAVENOUS | Status: AC
  Filled 2015-11-24: qty 2

## 2015-11-24 MED ORDER — HYDROMORPHONE HCL 1 MG/ML IJ SOLN
0.2500 mg | INTRAMUSCULAR | Status: DC | PRN
  Administered 2015-11-24 (×4): 0.5 mg via INTRAVENOUS

## 2015-11-24 MED ORDER — DIPHENHYDRAMINE HCL 12.5 MG/5ML PO ELIX: 12.5000 mg | ORAL_SOLUTION | Freq: Four times a day (QID) | ORAL | Status: DC | PRN

## 2015-11-24 MED ORDER — LACTATED RINGERS IV SOLN
INTRAVENOUS | Status: DC
  Administered 2015-11-24 – 2015-11-25 (×4): via INTRAVENOUS

## 2015-11-24 MED ORDER — PROPOFOL 10 MG/ML IV BOLUS
INTRAVENOUS | Status: AC
  Filled 2015-11-24: qty 20

## 2015-11-24 MED ORDER — MIDAZOLAM HCL 2 MG/2ML IJ SOLN
INTRAMUSCULAR | Status: AC
  Filled 2015-11-24: qty 2

## 2015-11-24 SURGICAL SUPPLY — 38 items
BLADE SURG ROTATE 9660 (MISCELLANEOUS) IMPLANT
CANISTER SUCTION 2500CC (MISCELLANEOUS) ×4 IMPLANT
CHLORAPREP W/TINT 26ML (MISCELLANEOUS) ×4 IMPLANT
COVER SURGICAL LIGHT HANDLE (MISCELLANEOUS) ×4 IMPLANT
DRAPE LAPAROSCOPIC ABDOMINAL (DRAPES) ×4 IMPLANT
DRAPE WARM FLUID 44X44 (DRAPE) ×4 IMPLANT
DRSG OPSITE POSTOP 4X10 (GAUZE/BANDAGES/DRESSINGS) IMPLANT
DRSG OPSITE POSTOP 4X8 (GAUZE/BANDAGES/DRESSINGS) IMPLANT
ELECT BLADE 6.5 EXT (BLADE) ×2 IMPLANT
ELECT CAUTERY BLADE 6.4 (BLADE) ×4 IMPLANT
ELECT REM PT RETURN 9FT ADLT (ELECTROSURGICAL) ×4
ELECTRODE REM PT RTRN 9FT ADLT (ELECTROSURGICAL) ×2 IMPLANT
GLOVE BIO SURGEON STRL SZ8 (GLOVE) ×4 IMPLANT
GLOVE BIOGEL PI IND STRL 8 (GLOVE) ×2 IMPLANT
GLOVE BIOGEL PI INDICATOR 8 (GLOVE) ×2
GOWN STRL REUS W/ TWL LRG LVL3 (GOWN DISPOSABLE) ×2 IMPLANT
GOWN STRL REUS W/ TWL XL LVL3 (GOWN DISPOSABLE) ×2 IMPLANT
GOWN STRL REUS W/TWL LRG LVL3 (GOWN DISPOSABLE) ×4
GOWN STRL REUS W/TWL XL LVL3 (GOWN DISPOSABLE) ×4
KIT BASIN OR (CUSTOM PROCEDURE TRAY) ×4 IMPLANT
KIT ROOM TURNOVER OR (KITS) ×4 IMPLANT
LIGASURE IMPACT 36 18CM CVD LR (INSTRUMENTS) IMPLANT
NS IRRIG 1000ML POUR BTL (IV SOLUTION) ×8 IMPLANT
PACK GENERAL/GYN (CUSTOM PROCEDURE TRAY) ×4 IMPLANT
PAD ARMBOARD 7.5X6 YLW CONV (MISCELLANEOUS) ×4 IMPLANT
SPECIMEN JAR LARGE (MISCELLANEOUS) IMPLANT
SPONGE GAUZE 4X4 12PLY STER LF (GAUZE/BANDAGES/DRESSINGS) ×2 IMPLANT
SPONGE LAP 18X18 X RAY DECT (DISPOSABLE) IMPLANT
STAPLER VISISTAT 35W (STAPLE) ×4 IMPLANT
SUCTION POOLE TIP (SUCTIONS) ×4 IMPLANT
SUT PDS AB 1 TP1 96 (SUTURE) ×8 IMPLANT
SUT SILK 2 0 SH CR/8 (SUTURE) ×4 IMPLANT
SUT SILK 2 0 TIES 10X30 (SUTURE) ×4 IMPLANT
SUT SILK 3 0 SH CR/8 (SUTURE) ×4 IMPLANT
SUT SILK 3 0 TIES 10X30 (SUTURE) ×4 IMPLANT
TOWEL OR 17X26 10 PK STRL BLUE (TOWEL DISPOSABLE) ×4 IMPLANT
TRAY FOLEY CATH 16FRSI W/METER (SET/KITS/TRAYS/PACK) ×2 IMPLANT
YANKAUER SUCT BULB TIP NO VENT (SUCTIONS) IMPLANT

## 2015-11-24 NOTE — Transfer of Care (Signed)
Immediate Anesthesia Transfer of Care Note  Patient: Meagan Dean  Procedure(s) Performed: Procedure(s): EXPLORATORY LAPAROTOMY (N/A) LYSIS OF ADHESION (N/A)  Patient Location: PACU  Anesthesia Type:General  Level of Consciousness: lethargic and responds to stimulation  Airway & Oxygen Therapy: Patient Spontanous Breathing and Patient connected to face mask oxygen  Post-op Assessment: Report given to RN  Post vital signs: Reviewed and stable  Last Vitals:  Filed Vitals:   11/24/15 0444 11/24/15 0842  BP: 101/90 112/70  Pulse: 101 75  Temp: 36.6 C 37.3 C  Resp: 18 18    Last Pain:  Filed Vitals:   11/24/15 0843  PainSc: Asleep      Patients Stated Pain Goal: 3 ([phone] [id number])  Complications: No apparent anesthesia complications

## 2015-11-24 NOTE — Anesthesia Preprocedure Evaluation (Addendum)
Anesthesia Evaluation  Patient identified by MRN, date of birth, ID band Patient awake    Reviewed: Allergy & Precautions, NPO status , Patient's Chart, lab work & pertinent test results  Airway Mallampati: I       Dental  (+) Teeth Intact, Dental Advisory Given   Pulmonary asthma ,    breath sounds clear to auscultation       Cardiovascular  Rhythm:Regular Rate:Normal     Neuro/Psych    GI/Hepatic hiatal hernia, GERD  ,Previous gastric banding   Endo/Other    Renal/GU      Musculoskeletal   Abdominal   Peds  Hematology  (+) Blood dyscrasia, anemia ,   Anesthesia Other Findings   Reproductive/Obstetrics                            Anesthesia Physical  Anesthesia Plan  ASA: II  Anesthesia Plan: General   Post-op Pain Management:    Induction: Intravenous and Rapid sequence  Airway Management Planned: Oral ETT  Additional Equipment:   Intra-op Plan:   Post-operative Plan: Extubation in OR  Informed Consent: I have reviewed the patients History and Physical, chart, labs and discussed the procedure including the risks, benefits and alternatives for the proposed anesthesia with the patient or authorized representative who has indicated his/her understanding and acceptance.   Dental advisory given  Plan Discussed with: CRNA and Surgeon  Anesthesia Plan Comments:         Anesthesia Quick Evaluation

## 2015-11-24 NOTE — Progress Notes (Signed)
Day of Surgery  Subjective: crampy pain and nausea overnight  Objective: Vital signs in last 24 hours: Temp:  [97.8 F (36.6 C)-99 F (37.2 C)] 97.8 F (36.6 C) (07/14 0444) Pulse Rate:  [82-101] 101 (07/14 0444) Resp:  [18] 18 (07/14 0444) BP: (101-107)/(63-90) 101/90 mmHg (07/14 0444) SpO2:  [98 %-100 %] 98 % (07/14 0444) Last BM Date: 11/21/15  Intake/Output from previous day: 07/13 0701 - 07/14 0700 In: 877.5 [P.O.:280; I.V.:567.5; NG/GT:30] Out: 3550 [Urine:2500; Emesis/NG output:1050] Intake/Output this shift:    General appearance: cooperative Resp: clear to auscultation bilaterally Cardio: regular rate and rhythm GI: soft, mild tenderness LUQ, few BS, no generalized tenderness  Lab Results:  No results for input(s): WBC, HGB, HCT, PLT in the last 72 hours. BMET  Recent Labs  11/23/15 0527 11/24/15 0503  NA 135 134*  K 4.4 3.7  CL 107 101  CO2 17* 21*  GLUCOSE 51* 64*  BUN 7 <5*  CREATININE 0.73 0.66  CALCIUM 8.1* 8.5*   PT/INR No results for input(s): LABPROT, INR in the last 72 hours. ABG No results for input(s): PHART, HCO3 in the last 72 hours.  Invalid input(s): PCO2, PO2  Studies/Results: Dg Abd Portable 1v  11/22/2015  CLINICAL DATA:  Small bowel obstruction EXAM: PORTABLE ABDOMEN - 1 VIEW COMPARISON:  11/20/2015 and 11/21/2015 FINDINGS: NG tube in place with tip in distal stomach. Gastric band is unchanged in position. Mild gaseous distended small bowel loops in mid abdomen suspicious for ileus or partial bowel obstruction. Some contrast material noted within right colon and transverse colon. IMPRESSION: Mild gaseous distended small bowel loops mid abdomen suspicious for ileus or partial small bowel obstruction. Contrast material noted within right colon and transverse colon. NG tube in place. Electronically Signed   By: Lahoma Crocker M.D.   On: 11/22/2015 10:54   Dg Abd Portable 2v  11/23/2015  CLINICAL DATA:  45 year old with prior laparoscopic  banding gastric procedure who presented this admission with a partial small bowel obstruction. Followup. EXAM: PORTABLE ABDOMEN - 2 VIEW COMPARISON:  Abdomen x-ray yesterday, 11/20/2015 and earlier. CT abdomen and pelvis 11/21/2015. FINDINGS: Persistent gaseous distention of scattered loops of small bowel in the left upper quadrant, though the degree of distention is less and there are fewer distended loops of small bowel on the current examination when compared to yesterday. Oral contrast material from the CT 2 days ago is present throughout decompressed colon to the rectum. There is no evidence of free air on the lateral decubitus image. OG tube tip in the distal body of the stomach. Laparoscopic gastric band appropriately oriented with intact tubing. IMPRESSION: Improving partial small bowel obstruction. Electronically Signed   By: Evangeline Dakin M.D.   On: 11/23/2015 16:22    Anti-infectives: Anti-infectives    Start     Dose/Rate Route Frequency Ordered Stop   11/24/15 1000  cefOXitin (MEFOXIN) 2 g in dextrose 5 % 50 mL IVPB     2 g 100 mL/hr over 30 Minutes Intravenous  Once 11/24/15 [id number]        Assessment/Plan: PSBO - not improving, for ex lap, possible bowel resection this AM. Procedure, risks, and benefits D/W her and she agrees. ID - cefoxitin on call to OR Lap band in place - will plan fill in office once home and recovered VTE - hold Lovenox  LOS: 7 days    Marsh Heckler E 11/24/2015

## 2015-11-24 NOTE — Op Note (Signed)
11/17/2015 - 11/24/2015  11:08 AM  PATIENT:  Meagan Dean  45 y.o. female  PRE-OPERATIVE DIAGNOSIS:  small bowel obstruction  POST-OPERATIVE DIAGNOSIS:  small bowel obstruction  PROCEDURE:  Procedure(s): EXPLORATORY LAPAROTOMY LYSIS OF ADHESIONS 30MIN  SURGEON:  Surgeon(s): Georganna Skeans, MD  ASSISTANTS: Melina Modena, PAC   ANESTHESIA:   general  EBL:  Total I/O In: 1533.3 [I.V.:1533.3] Out: 600 [Urine:300; Emesis/NG output:200; Blood:100]  BLOOD ADMINISTERED:none  DRAINS: none   SPECIMEN:  No Specimen  DISPOSITION OF SPECIMEN:  N/A  COUNTS:  YES  DICTATION: .Dragon Dictation  Findings: Multiple small bowel adhesions in the right lower quadrant, large pelvic adnexal cyst  Procedure in detail: [name] presents for exploratory laparotomy for small bowel obstruction. She was identified in the preop holding area. Informed consent was obtained. She received intravenous and buttocks. She was brought to the operating room and general endotracheal anesthesia was administered by the anesthesia staff. Foley catheter was placed by nursing. Her abdomen was prepped and draped in sterile fashion. Time out procedure was performed. Midline incision was made. Subcutaneous tissues were dissected down revealing the anterior fascia which was divided along the midline. Peritoneal cavity was entered carefully under direct vision. The fascia was then gradually open to the length of the incision being very careful of her lap band port tubing. Exploration revealed no significant adhesions up to the anterior abdominal wall, however there were some adhesions down in the right lower quadrant. The bowel was run from the ligament of Treitz downward. Left upper quadrant proximal small bowel was somewhat dilated but all viable. As we approached the right lower quadrant there were several adhesions involving multiple loops of more distal small bowel. These were carefully taken down over 30 minutes. There were no  enterotomies. This freed up the bowel to the ileocecal valve. Bowel was run one more time and no other points obstruction were identified. There is a very large pelvic adnexal cyst consistent with her CT findings. Her lap band port tubing was positioned appropriately anterior to the bowel. Abdomen was copiously irrigated. Hemostasis was ensured. Nasogastric tube was repositioned in her stomach for appropriate drainage. The fascia was closed with 2 lengths of running #1 looped PDS tied in the middle. Subcutaneous tissues were irrigated and the skin was closed with staples. All counts were correct. She tolerated the procedure well without apparent complications and was taken recovery in stable condition. PATIENT DISPOSITION:  PACU - hemodynamically stable.   Delay start of Pharmacological VTE agent (>24hrs) due to surgical blood loss or risk of bleeding:  no  Georganna Skeans, MD, MPH, FACS Pager: [phone]  7/14/201711:08 AM

## 2015-11-24 NOTE — Anesthesia Postprocedure Evaluation (Signed)
Anesthesia Post Note  Patient: Meagan Dean  Procedure(s) Performed: Procedure(s) (LRB): EXPLORATORY LAPAROTOMY (N/A) LYSIS OF ADHESION (N/A)  Patient location during evaluation: PACU Anesthesia Type: General Level of consciousness: awake and alert Pain management: pain level controlled Vital Signs Assessment: post-procedure vital signs reviewed and stable Respiratory status: spontaneous breathing, nonlabored ventilation, respiratory function stable and patient connected to nasal cannula oxygen Cardiovascular status: blood pressure returned to baseline and stable Postop Assessment: no signs of nausea or vomiting Anesthetic complications: no    Last Vitals:  Filed Vitals:   11/24/15 1155 11/24/15 1223  BP:  123/78  Pulse:  92  Temp: 36.6 C 36.6 C  Resp:      Last Pain:  Filed Vitals:   11/24/15 1224  PainSc: 10-Worst pain ever                 Zenaida Deed

## 2015-11-24 NOTE — Progress Notes (Signed)
Initial Nutrition Assessment  DOCUMENTATION CODES:   Obesity unspecified  INTERVENTION:   -RD will follow for diet advancement and supplement as appropriate -If prolonged NPO/clear liquid status is anticipated, consider initiation of nutrition support  NUTRITION DIAGNOSIS:   Inadequate oral intake related to altered GI function as evidenced by NPO status.  GOAL:   Patient will meet greater than or equal to 90% of their needs  MONITOR:   Diet advancement, Labs, Weight trends, Skin, I & O's  REASON FOR ASSESSMENT:   NPO/Clear Liquid Diet    ASSESSMENT:   [name] is a 45 yo female with PMHx of lap band procedure in 2010, ureterolithasis, perforated appendicitis s/p lap appendectomy in March 2016, and fibroids who presented to the ED last night with complaint of vomiting and abdominal pain. Patient states she felt slightly nauseous yesterday and constipated. Her symptoms were progressive through the night to the point where she was in severe diffuse abdominal pain, nauseous and with persistent vomiting. She also felt bloated and could not have a bowel movement. She denied any fever, chills, chest pain, shortness of breath, weakness, lightheadedness, blood in her stools, or dysuria. However, in the ED, she later developed diarrhea with 4 watery bowel movements  Pt admitted with SBO. Noted pt with hx of lap band in 2010; band was drained on 11/17/15.   Pt remains with NGT for decompression; noted approximately 1200 ml output within the past 24 hours. Clamping trials initiated on 11/20/15. Pt failed clamping trials x 2. She is to undergo ex lap with lysis of adhesions today.   Pt down in PACU at time of visit. No family present. Unable to complete Nutrition-Focused physical exam at this time.   Wt hx reviewed. UBW around 147#. Noted slight wt gain trend over the past 2 years.   Pt has received inadequate nutrition during hospital admission (NPO/clear liquids x 7 days). If prolonged  NPO/clear liquid stats is anticipated, consider initiation of nutrition support.   Labs reviewed: Na: 134 (on IV supplementation).   Diet Order:  Diet NPO time specified  Skin:  Reviewed, no issues  Last BM:  11/21/15  Height:   Ht Readings from Last 1 Encounters:  11/17/15 5\' 2"  (1.575 m)    Weight:   Wt Readings from Last 1 Encounters:  11/17/15 165 lb 3.2 oz (74.934 kg)    Ideal Body Weight:  50 kg  BMI:  Body mass index is 30.21 kg/(m^2).  Estimated Nutritional Needs:   Kcal:  1600-1800  Protein:  80-95 grams  Fluid:  1.6-1.8 L  EDUCATION NEEDS:   No education needs identified at this time  Bodhi Stenglein A. Jimmye Norman, RD, LDN, CDE Pager: [phone] After hours Pager: [phone]

## 2015-11-24 NOTE — Progress Notes (Signed)
   Subjective: Ms. Geitz was evaluated this afternoon after her abdominal surgery for partial small bowel obstruction. She was somnolent and soft spoken when interviewed.  She was able to report that she felt sore but the pain and bloating was a little better.  NGT is in place with suction. She denied SOB or chest pain. Objective: Vital signs in last 24 hours: Filed Vitals:   11/24/15 1155 11/24/15 1223 11/24/15 1435 11/24/15 1613  BP:  123/78 119/70   Pulse:  92 90 100  Temp: 97.8 F (36.6 C) 97.9 F (36.6 C) 98.7 F (37.1 C)   TempSrc:  Oral Oral   Resp:   16 14  Height:      Weight:      SpO2:  99% 93% 96%   Physical Exam Physical Exam  Constitutional:  Mild distress  HENT:  Head: Normocephalic and atraumatic.  Cardiovascular: Normal rate, regular rhythm and normal heart sounds.   Pulmonary/Chest: Effort normal and breath sounds normal. No respiratory distress.  Abdominal:  Dressing in place midline of abdomen  No bowel sounds appreciated  Musculoskeletal: She exhibits no edema.  Skin: Skin is warm and dry.     Assessment/Plan: Partial bowel obstruction [facility]) Patient underwent Exploratory Laparotomy with lysis of adhesions today 7/14. Surgery's note stated that on exploration there were some adhesions down in the right lower quadrant and several adhesions involving multiple loops of more distal small bowel. These adhesions were taken down.  On interview a history was difficult to obtain due to opioid management post surgery. -BMET in am       Dispo: Anticipated discharge in approximately 3 day(s).   LOS: 7 days   Valinda Party, DO 11/24/2015, 4:50 PM Pager: [phone]

## 2015-11-24 NOTE — Anesthesia Procedure Notes (Signed)
Procedure Name: Intubation Date/Time: 11/24/2015 10:10 AM Performed by: Sampson Si E Pre-anesthesia Checklist: Patient identified, Emergency Drugs available, Suction available, Patient being monitored and Timeout performed Patient Re-evaluated:Patient Re-evaluated prior to inductionOxygen Delivery Method: Circle system utilized Preoxygenation: Pre-oxygenation with 100% oxygen Intubation Type: IV induction, Rapid sequence and Cricoid Pressure applied Laryngoscope Size: Mac and 3 Grade View: Grade I Tube type: Subglottic suction tube Tube size: 7.0 mm Number of attempts: 1 Airway Equipment and Method: Stylet Placement Confirmation: ETT inserted through vocal cords under direct vision,  positive ETCO2 and breath sounds checked- equal and bilateral Secured at: 21 cm Tube secured with: Tape Dental Injury: Teeth and Oropharynx as per pre-operative assessment

## 2015-11-25 MED ORDER — FAMOTIDINE IN NACL 20-0.9 MG/50ML-% IV SOLN
20.0000 mg | Freq: Once | INTRAVENOUS | Status: AC
  Administered 2015-11-25: 20 mg via INTRAVENOUS
  Filled 2015-11-25: qty 50

## 2015-11-25 NOTE — Progress Notes (Signed)
1 Day Post-Op  Subjective: Stable and alert. NG and Foley in place.  Excellent urine output Pain reasonably controlled Hemoglobin 9.4.  WBC 5700 (yesterday) Operative findings discussed with patient.  Objective: Vital signs in last 24 hours: Temp:  [97.3 F (36.3 C)-100.6 F (38.1 C)] 100 F (37.8 C) (07/15 0527) Pulse Rate:  [90-108] 103 (07/15 0527) Resp:  [14-22] 20 (07/15 0341) BP: (107-128)/(66-86) 116/67 mmHg (07/15 0527) SpO2:  [93 %-100 %] 98 % (07/15 0527) FiO2 (%):  [92 %-98 %] 98 % (07/15 0341) Last BM Date: 11/21/15  Intake/Output from previous day: 07/14 0701 - 07/15 0700 In: 2431.7 [I.V.:2431.7] Out: 2550 [Urine:2250; Emesis/NG output:200; Blood:100] Intake/Output this shift:    General appearance: Alert.  Mild distress from pain.  Middle status normal. Resp: clear to auscultation bilaterally GI: Appropriately tender but soft.  Wound looks fine.  Bowel sounds absent.  Lab Results:  No results found for this or any previous visit (from the past 24 hour(s)).   Studies/Results: No results found.  Marland Kitchen antiseptic oral rinse  7 mL Mouth Rinse q12n4p  . chlorhexidine  15 mL Mouth Rinse BID  . enoxaparin (LOVENOX) injection  40 mg Subcutaneous Q24H  . HYDROmorphone   Intravenous Q4H  . loratadine  10 mg Per NG tube Daily     Assessment/Plan: s/p Procedure(s): EXPLORATORY LAPAROTOMY LYSIS OF ADHESION  POD #1.  Laparotomy with lysis of adhesions for SBO secondary to adhesions. Stable Expected ileus Continue NG tube Mobilize Incentive spirometry  Status post adjustable laparoscopic band.  Reportedly band has been deflated. DVT-lovenox @PROBHOSP @  LOS: 8 days    Quinnlyn Hearns M 11/25/2015  . .prob

## 2015-11-25 NOTE — Progress Notes (Signed)
   Subjective: Ms. Sprenkle was laying in bed watching TV when evaluated today.  She states she feels a sense of relief and although she is sore she reports feeling improvement from her abdominal pain.  She denies SOB, chest pain, leg swelling.  Objective: Vital signs in last 24 hours: Filed Vitals:   11/25/15 1009 11/25/15 1155 11/25/15 1419 11/25/15 1615  BP: 107/66  102/64   Pulse: 113  100   Temp: 98.6 F (37 C)  99.1 F (37.3 C)   TempSrc: Oral  Oral   Resp: 20 14 21 20   Height:      Weight:      SpO2: 98% 97% 93% 95%   Physical Exam Physical Exam  Constitutional: She is oriented to person, place, and time. No distress.  NGT in place  HENT:  Head: Normocephalic and atraumatic.  Eyes: Pupils are equal, round, and reactive to light.  Cardiovascular: Normal rate, regular rhythm and normal heart sounds.   Pulmonary/Chest: Effort normal and breath sounds normal. No respiratory distress.  Abdominal: There is tenderness.  Post surgery bandage on abdomen all 4 quadrants  Musculoskeletal: She exhibits no edema.  Neurological: She is alert and oriented to person, place, and time.  Skin: Skin is dry.  Psychiatric: Mood and affect normal.     Assessment/Plan: Partial bowel obstruction ([place]) This is day 1 of post abdominal laparotomy for partial small bowel obstruction.  Patient reports soreness  but her pain is being well controlled.  She denies the same abdominal pain she was experiencing during admission.  Patient appears more alert today and in no distress.       - BMET and CBC in am - need to consider nutritional replacement options   Dispo: Anticipated discharge in approximately 3 day(s).   LOS: 8 days   Valinda Party, DO 11/25/2015, 6:17 PM Pager: [phone]

## 2015-11-26 DIAGNOSIS — K567 Ileus, unspecified: Secondary | ICD-10-CM

## 2015-11-26 LAB — BASIC METABOLIC PANEL
ANION GAP: 12 (ref 5–15)
BUN: <5 mg/dL — ABNORMAL LOW (ref 6–20)
CO2: 24 mmol/L (ref 22–32)
Calcium: 8.7 mg/dL — ABNORMAL LOW (ref 8.9–10.3)
Chloride: 100 mmol/L — ABNORMAL LOW (ref 101–111)
Creatinine, Ser: 0.83 mg/dL (ref 0.44–1.00)
GFR calc Af Amer: >60 mL/min (ref >60)
Glucose, Bld: 77 mg/dL (ref 65–99)
POTASSIUM: 3.5 mmol/L (ref 3.5–5.1)
SODIUM: 136 mmol/L (ref 135–145)

## 2015-11-26 LAB — CBC
HCT: 31.3 % — ABNORMAL LOW (ref 36.0–46.0)
HEMOGLOBIN: 10.4 g/dL — AB (ref 12.0–15.0)
MCH: 27.7 pg (ref 26.0–34.0)
MCHC: 33.2 g/dL (ref 30.0–36.0)
MCV: 83.5 fL (ref 78.0–100.0)
PLATELETS: 484 10*3/uL — AB (ref 150–400)
RBC: 3.75 MIL/uL — AB (ref 3.87–5.11)
RDW: 13.3 % (ref 11.5–15.5)
WBC: 10.1 10*3/uL (ref 4.0–10.5)

## 2015-11-26 LAB — GLUCOSE, CAPILLARY: GLUCOSE-CAPILLARY: 82 mg/dL (ref 65–99)

## 2015-11-26 MED ORDER — POTASSIUM CHLORIDE 2 MEQ/ML IV SOLN
INTRAVENOUS | Status: DC
  Filled 2015-11-26 (×7): qty 1000

## 2015-11-26 NOTE — Progress Notes (Signed)
   Subjective: Meagan Dean was evaluated on rounds this morning.  She states that she has seen improvement in her abdominal pain but is sore from the surgery.  She continues to have the NG tube.  She was been able to walk and use the bathroom.  She has not had a bowel movement or passed gas.  She denies SOB, facial pain or leg swelling.  She states her pain is managed well at this time.   Objective: Vital signs in last 24 hours: Filed Vitals:   11/26/15 0442 11/26/15 0800 11/26/15 0931 11/26/15 1204  BP: 111/64  103/63   Pulse: 90  97   Temp: 98.7 F (37.1 C)  98.6 F (37 C)   TempSrc: Oral  Oral   Resp: 20 20  17   Height:      Weight:      SpO2: 93% 94% 98% 99%   Physical Exam Physical Exam  Constitutional:  Mild distress NGT in place  HENT:  Head: Normocephalic and atraumatic.  Cardiovascular: Normal rate, regular rhythm and normal heart sounds.   Pulmonary/Chest: Effort normal and breath sounds normal. No respiratory distress.  Abdominal: There is tenderness.  Bandage in all 4 abdominal quadrants No bowel sounds present     Assessment/Plan: Partial bowel obstruction ([place]) This is day 2 post surgery.  Patient denies a bowel movement or passing gas since surgery.  Ileus is not surprising at this time.  Patient will need to ambulate more to help with bowels.  Patient post surgery may benefit from incentive spirometry to help lung function. - ambulate with assistance - incentive spirometry - Per surgery recommendations NG tube will continue - lovenox for DVT prophylaxis        Hypokalemia Currently 3.5 - Continue lactated ringer with potassium chloride      Dispo: Anticipated discharge in approximately 3 day(s).   LOS: 9 days   Valinda Party, DO 11/26/2015, 12:49 PM Pager: [phone]

## 2015-11-26 NOTE — Progress Notes (Signed)
2 Days Post-Op  Subjective: Alert.  Mild distress.  Denies difficulty breathing.  Voiding without difficulty.  Ambulating to bathroom in room.  Has not been in hall. No flatus or stool yet NG output low Says she still needs to PCA  WBC 10,100.  Hemoglobin 10.4.  Potassium 3.5.  Creatinine 0.83.  Glucose 77.  Objective: Vital signs in last 24 hours: Temp:  [98.6 F (37 C)-100 F (37.8 C)] 98.7 F (37.1 C) (07/16 0442) Pulse Rate:  [90-113] 90 (07/16 0442) Resp:  [14-28] 20 (07/16 0442) BP: (102-116)/(64-72) 111/64 mmHg (07/16 0442) SpO2:  [93 %-98 %] 93 % (07/16 0442) FiO2 (%):  [92 %-93 %] 92 % (07/16 0421) Last BM Date: 11/21/15 (prior to surgery)  Intake/Output from previous day: 07/15 0701 - 07/16 0700 In: 424.2 [I.V.:424.2] Out: 1375 [Urine:1275; Emesis/NG output:100] Intake/Output this shift: Total I/O In: -  Out: 800 [Urine:800]  General appearance: Alert and cooperative.  Mental status normal.  Minimal distress. Resp: clear to auscultation bilaterally GI: Soft.  Nondistended.  No bowel sounds.  Wound clean and dry.  Lab Results:  Results for orders placed or performed during the hospital encounter of 11/17/15 (from the past 24 hour(s))  Glucose, capillary     Status: None   Collection Time: 11/26/15 12:43 AM  Result Value Ref Range   Glucose-Capillary 82 65 - 99 mg/dL  CBC     Status: Abnormal   Collection Time: 11/26/15  4:15 AM  Result Value Ref Range   WBC 10.1 4.0 - 10.5 K/uL   RBC 3.75 (L) 3.87 - 5.11 MIL/uL   Hemoglobin 10.4 (L) 12.0 - 15.0 g/dL   HCT 31.3 (L) 36.0 - 46.0 %   MCV 83.5 78.0 - 100.0 fL   MCH 27.7 26.0 - 34.0 pg   MCHC 33.2 30.0 - 36.0 g/dL   RDW 13.3 11.5 - 15.5 %   Platelets 484 (H) 150 - 400 K/uL  Basic metabolic panel     Status: Abnormal   Collection Time: 11/26/15  4:15 AM  Result Value Ref Range   Sodium 136 135 - 145 mmol/L   Potassium 3.5 3.5 - 5.1 mmol/L   Chloride 100 (L) 101 - 111 mmol/L   CO2 24 22 - 32 mmol/L   Glucose, Bld 77 65 - 99 mg/dL   BUN <5 (L) 6 - 20 mg/dL   Creatinine, Ser 0.83 0.44 - 1.00 mg/dL   Calcium 8.7 (L) 8.9 - 10.3 mg/dL   GFR calc non Af Amer >60 >60 mL/min   GFR calc Af Amer >60 >60 mL/min   Anion gap 12 5 - 15     Studies/Results: No results found.  Marland Kitchen antiseptic oral rinse  7 mL Mouth Rinse q12n4p  . chlorhexidine  15 mL Mouth Rinse BID  . enoxaparin (LOVENOX) injection  40 mg Subcutaneous Q24H  . HYDROmorphone   Intravenous Q4H  . loratadine  10 mg Per NG tube Daily     Assessment/Plan: s/p Procedure(s): EXPLORATORY LAPAROTOMY LYSIS OF ADHESION  POD #2. Laparotomy with lysis of adhesions for SBO secondary to adhesions. Stable Expected ileus Continue NG tube Mobilize more Incentive spirometry  Status post adjustable laparoscopic band. Reportedly band has been deflated. DVT-lovenox  @PROBHOSP @  LOS: 9 days    Julanne Schlueter M 11/26/2015  . .prob

## 2015-11-27 ENCOUNTER — Encounter (HOSPITAL_COMMUNITY): Payer: Self-pay | Admitting: General Surgery

## 2015-11-27 DIAGNOSIS — K56609 Unspecified intestinal obstruction, unspecified as to partial versus complete obstruction: Secondary | ICD-10-CM | POA: Insufficient documentation

## 2015-11-27 LAB — BASIC METABOLIC PANEL
Anion gap: 15 (ref 5–15)
CO2: 17 mmol/L — ABNORMAL LOW (ref 22–32)
CREATININE: 0.87 mg/dL (ref 0.44–1.00)
Calcium: 8.5 mg/dL — ABNORMAL LOW (ref 8.9–10.3)
Chloride: 103 mmol/L (ref 101–111)
GFR calc Af Amer: >60 mL/min (ref >60)
Glucose, Bld: 80 mg/dL (ref 65–99)
POTASSIUM: 3.3 mmol/L — AB (ref 3.5–5.1)
SODIUM: 135 mmol/L (ref 135–145)

## 2015-11-27 LAB — MAGNESIUM: Magnesium: 1.5 mg/dL — ABNORMAL LOW (ref 1.7–2.4)

## 2015-11-27 LAB — CBC
HCT: 33.1 % — ABNORMAL LOW (ref 36.0–46.0)
Hemoglobin: 11 g/dL — ABNORMAL LOW (ref 12.0–15.0)
MCH: 27.5 pg (ref 26.0–34.0)
MCHC: 33.2 g/dL (ref 30.0–36.0)
MCV: 82.8 fL (ref 78.0–100.0)
PLATELETS: 531 10*3/uL — AB (ref 150–400)
RBC: 4 MIL/uL (ref 3.87–5.11)
RDW: 13.7 % (ref 11.5–15.5)
WBC: 14.9 10*3/uL — AB (ref 4.0–10.5)

## 2015-11-27 MED ORDER — ACETAMINOPHEN 10 MG/ML IV SOLN
1000.0000 mg | Freq: Four times a day (QID) | INTRAVENOUS | Status: AC
  Administered 2015-11-27 – 2015-11-28 (×4): 1000 mg via INTRAVENOUS
  Filled 2015-11-27 (×4): qty 100

## 2015-11-27 MED ORDER — KCL IN DEXTROSE-NACL 20-5-0.45 MEQ/L-%-% IV SOLN
INTRAVENOUS | Status: DC
  Administered 2015-11-27 – 2015-11-28 (×2): via INTRAVENOUS
  Filled 2015-11-27 (×4): qty 1000

## 2015-11-27 MED ORDER — MAGNESIUM SULFATE 2 GM/50ML IV SOLN
2.0000 g | Freq: Once | INTRAVENOUS | Status: AC
  Administered 2015-11-27: 2 g via INTRAVENOUS
  Filled 2015-11-27: qty 50

## 2015-11-27 NOTE — Progress Notes (Signed)
3 Days Post-Op  Subjective: abd pain, no flatus/bm, says she has been up  Objective: Vital signs in last 24 hours: Temp:  [98 F (36.7 C)-99.8 F (37.7 C)] 98 F (36.7 C) (07/17 0350) Pulse Rate:  [89-97] 97 (07/17 0350) Resp:  [17-25] 25 (07/17 0813) BP: (100-115)/(60-70) 115/70 mmHg (07/17 0350) SpO2:  [96 %-99 %] 96 % (07/17 0813) Last BM Date: 11/26/15  Intake/Output from previous day:   Intake/Output this shift: Total I/O In: -  Out: 400 [Emesis/NG output:400]  GI: soft tender to palpation, wound clean, some bs present  Lab Results:   Recent Labs  11/26/15 0415  WBC 10.1  HGB 10.4*  HCT 31.3*  PLT 484*   BMET  Recent Labs  11/26/15 0415  NA 136  K 3.5  CL 100*  CO2 24  GLUCOSE 77  BUN <5*  CREATININE 0.83  CALCIUM 8.7*    Anti-infectives: Anti-infectives    Start     Dose/Rate Route Frequency Ordered Stop   11/24/15 1000  [MAR Hold]  cefOXitin (MEFOXIN) 2 g in dextrose 5 % 50 mL IVPB     (MAR Hold since 11/24/15 0911)   2 g 100 mL/hr over 30 Minutes Intravenous To [facility] 11/24/15 0738 11/24/15 1046      Assessment/Plan: POD #2. Laparotomy with lysis of adhesions for SBO secondary to adhesions. Dr Grandville Silos S/p elap- has ileus, will continue ng tube for now, npo, will add iv tylenol for 24 hours for pain control Status post adjustable laparoscopic band.  band has been deflated. DVT-lovenox, scds   [facility] 11/27/2015

## 2015-11-27 NOTE — Progress Notes (Signed)
Pt has had several loose bowel movements today. Attempts to ambulate around unit declined by pt as she is worried of having an 'accident' while walking

## 2015-11-27 NOTE — Progress Notes (Signed)
3 Days Post-Op  Subjective: Intermittent nausea. Pain well controlled. No vomiting. Denies flatus. NG tube output 400 mL.   Objective: Vital signs in last 24 hours: Temp:  [98 F (36.7 C)-99.8 F (37.7 C)] 98 F (36.7 C) (07/17 0350) Pulse Rate:  [89-97] 97 (07/17 0350) Resp:  [17-20] 20 (07/17 0529) BP: (100-115)/(60-70) 115/70 mmHg (07/17 0350) SpO2:  [96 %-99 %] 97 % (07/17 0529) Last BM Date: 11/26/15  Intake/Output from previous day:   Intake/Output this shift: Total I/O In: -  Out: 400 [Emesis/NG output:400]  General appearance  Supine. Awake and watching television.  Head: Normocephalic/atraumatic. PERRL bilaterally. Extraocular eye muscles in tact.  Neck: FROM Cardiovascular: Regular rate and rhythm without gallops murmurs or rubs Respiratory: Lung sounds vesicular. No wheezes, rales or rhonchi auscultated Abdomen: Soft and mildly tender without guarding, distension, masses, or rigidity. Bowel sounds hypoactive. Laparotomy incision without erythema, streaking or exudate. Incision closed with staples and covered with dressing.    Lab Results:   Recent Labs  11/24/15 0820 11/26/15 0415  WBC 5.7 10.1  HGB 9.4* 10.4*  HCT 28.2* 31.3*  PLT 374 484*   BMET  Recent Labs  11/26/15 0415  NA 136  K 3.5  CL 100*  CO2 24  GLUCOSE 77  BUN <5*  CREATININE 0.83  CALCIUM 8.7*   PT/INR No results for input(s): LABPROT, INR in the last 72 hours. ABG No results for input(s): PHART, HCO3 in the last 72 hours.  Invalid input(s): PCO2, PO2  Studies/Results: No results found.  Anti-infectives: Anti-infectives    Start     Dose/Rate Route Frequency Ordered Stop   11/24/15 1000  [MAR Hold]  cefOXitin (MEFOXIN) 2 g in dextrose 5 % 50 mL IVPB     (MAR Hold since 11/24/15 0911)   2 g 100 mL/hr over 30 Minutes Intravenous To [facility] 11/24/15 0738 11/24/15 1046      Assessment/Plan: Small bowel obstruction  POD #3 s/p Exploratory Laparotomy and Lysis  of Adhesions by Dr. Georganna Skeans on 11/24/2015. Continue NG tube (current output 400 mL). Encourage ambulation.  FEN: NPO VTE: Lovenox  Dispo: Ileus    Lannie Fields PASII  11/27/2015

## 2015-11-27 NOTE — Progress Notes (Signed)
Pt scoring pain at 10/10 but has not used any pca medicine over the last 4 hours. Says the dilaudid in pca pump makes her stomach feel twisted

## 2015-11-27 NOTE — Progress Notes (Signed)
I saw Meagan Dean this afternoon and she stated she had 3 bowel movements.  I discussed alternative pain medication as she was not using the PCA pump.  However, she declined switching from her PCA pump and thinks the bowel movements will help improve the pain.  She was made aware she can continue to use the PCA button.  She was encouraged to continue to ambulate.

## 2015-11-27 NOTE — Progress Notes (Signed)
   Subjective: Ms. Tilmon was evaluated this morning on rounds.  She complains of cramp like abdominal pain that comes and goes.  She denies flatus or bowel movement.  She states she was able to ambulate the halls two times yesterday.  Her NGT is still in place. She states her pain is 10/10.  She denies SOB or chest pain   Objective: Vital signs in last 24 hours: Filed Vitals:   11/27/15 0042 11/27/15 0350 11/27/15 0529 11/27/15 0813  BP:  115/70    Pulse:  97    Temp:  98 F (36.7 C)    TempSrc:  Oral    Resp: 18 19 20 25   Height:      Weight:      SpO2: 99% 99% 97% 96%   Physical Exam Physical Exam  Constitutional: She is oriented to person, place, and time.  Mild distress  HENT:  Head: Normocephalic and atraumatic.  Cardiovascular: Normal rate, regular rhythm and normal heart sounds.   Pulmonary/Chest: Effort normal and breath sounds normal. No respiratory distress.  Abdominal: Soft. There is tenderness.  No bowel sounds appreciated   Musculoskeletal: She exhibits no edema.  Neurological: She is alert and oriented to person, place, and time.  Skin: Skin is warm and dry.     Assessment/Plan: Partial bowel obstruction (H[facility]) Post surgery day 3.  Patient has not had a bowel movement or been able to pass gas since surgery on 7/14.  Although she has ambulated the halls she has only been able walk up and down the halls two times in the last 24hrs.  She states her pain is a 10/10.  However, she has not pushed the PCA button for her pain medication in the last 6 hrs because she reports the medication makes her feel further uncomfortable.  Today she will be started on IV acetaminophen per surgery's recommendation.  I am hopefull that if her pain is more controlled she can begin to ambulate more.   - IV acetaminophen 1,000mg  - Ambulate halls  - NGT in place, awaiting surgeries recommendations for starting clear liquids   - BMET, CBC done this morning waiting on results    Dispo:  Anticipated discharge in approximately 3 day(s).   LOS: 10 days   Valinda Party, DO 11/27/2015, 10:41 AM Pager: [phone]

## 2015-11-28 LAB — BASIC METABOLIC PANEL
Anion gap: 10 (ref 5–15)
CO2: 24 mmol/L (ref 22–32)
Calcium: 8.3 mg/dL — ABNORMAL LOW (ref 8.9–10.3)
Chloride: 101 mmol/L (ref 101–111)
Creatinine, Ser: 0.57 mg/dL (ref 0.44–1.00)
GFR calc Af Amer: >60 mL/min (ref >60)
GLUCOSE: 136 mg/dL — AB (ref 65–99)
POTASSIUM: 3 mmol/L — AB (ref 3.5–5.1)
Sodium: 135 mmol/L (ref 135–145)

## 2015-11-28 LAB — MAGNESIUM: MAGNESIUM: 2.1 mg/dL (ref 1.7–2.4)

## 2015-11-28 LAB — CBC
HEMATOCRIT: 31.3 % — AB (ref 36.0–46.0)
Hemoglobin: 10.6 g/dL — ABNORMAL LOW (ref 12.0–15.0)
MCH: 27.6 pg (ref 26.0–34.0)
MCHC: 33.9 g/dL (ref 30.0–36.0)
MCV: 81.5 fL (ref 78.0–100.0)
Platelets: 536 10*3/uL — ABNORMAL HIGH (ref 150–400)
RBC: 3.84 MIL/uL — ABNORMAL LOW (ref 3.87–5.11)
RDW: 13.8 % (ref 11.5–15.5)
WBC: 11.2 10*3/uL — ABNORMAL HIGH (ref 4.0–10.5)

## 2015-11-28 MED ORDER — ONDANSETRON HCL 4 MG/2ML IJ SOLN: 4.0000 mg | Freq: Four times a day (QID) | INTRAMUSCULAR | Status: DC | PRN

## 2015-11-28 MED ORDER — DIPHENHYDRAMINE HCL 50 MG/ML IJ SOLN: 12.5000 mg | Freq: Four times a day (QID) | INTRAMUSCULAR | Status: DC | PRN

## 2015-11-28 MED ORDER — SODIUM CHLORIDE 0.9% FLUSH: 9.0000 mL | INTRAVENOUS | Status: DC | PRN

## 2015-11-28 MED ORDER — DIPHENHYDRAMINE HCL 12.5 MG/5ML PO ELIX: 12.5000 mg | ORAL_SOLUTION | Freq: Four times a day (QID) | ORAL | Status: DC | PRN

## 2015-11-28 MED ORDER — HYDROMORPHONE 1 MG/ML IV SOLN: INTRAVENOUS | Status: DC

## 2015-11-28 MED ORDER — PROMETHAZINE HCL 25 MG/ML IJ SOLN
6.2500 mg | Freq: Four times a day (QID) | INTRAMUSCULAR | Status: DC | PRN
  Administered 2015-11-28: 6.25 mg via INTRAVENOUS
  Filled 2015-11-28 (×2): qty 1

## 2015-11-28 MED ORDER — ENOXAPARIN SODIUM 40 MG/0.4ML ~~LOC~~ SOLN
40.0000 mg | SUBCUTANEOUS | Status: DC
  Administered 2015-11-28 – 2015-12-01 (×4): 40 mg via SUBCUTANEOUS
  Filled 2015-11-28 (×4): qty 0.4

## 2015-11-28 MED ORDER — NALOXONE HCL 0.4 MG/ML IJ SOLN: 0.4000 mg | INTRAMUSCULAR | Status: DC | PRN

## 2015-11-28 MED ORDER — OXYCODONE HCL 5 MG PO TABS
5.0000 mg | ORAL_TABLET | ORAL | Status: DC | PRN
  Administered 2015-11-28 – 2015-11-30 (×5): 5 mg via ORAL
  Filled 2015-11-28 (×5): qty 1

## 2015-11-28 MED ORDER — HYDROMORPHONE HCL 1 MG/ML IJ SOLN
0.5000 mg | INTRAMUSCULAR | Status: DC | PRN
  Administered 2015-11-28 – 2015-11-30 (×9): 1 mg via INTRAVENOUS
  Filled 2015-11-28 (×9): qty 1

## 2015-11-28 NOTE — Discharge Summary (Signed)
Name: Meagan Dean MRN: [id number] DOB: [birth year] 45 y.o. PCP: No Pcp Per Patient  Date of Admission: 11/17/2015  4:43 AM Date of Discharge: 11/30/2015 Attending Physician: Oval Linsey, MD  Discharge Diagnosis: 1. Partial Bowel Obstruction 2. Hypokalemia   Discharge Medications:   Medication List    TAKE these medications   oxyCODONE 5 MG immediate release tablet Commonly known as:  Oxy IR/ROXICODONE Take 1 tablet (5 mg total) by mouth every 4 (four) hours as needed for moderate pain.   Potassium Chloride ER 20 MEQ Tbcr Take 20 mEq by mouth daily.   promethazine 12.5 MG tablet Commonly known as:  PHENERGAN Take 1 tablet (12.5 mg total) by mouth every 6 (six) hours as needed for nausea or vomiting.   pseudoephedrine 30 MG tablet Commonly known as:  SUDAFED Take 30 mg by mouth every 6 (six) hours.       Disposition and follow-up:   Meagan Dean was discharged from [facility] in stable condition.  At the hospital follow up visit please address:  1.  Chronic Hypokalemia  2.  Labs / imaging needed at time of follow-up: BMP  3.  Pending labs/ test needing follow-up: none  Follow-up Appointments: Follow-up Information    Edward Jolly, MD.   Specialty:  General Surgery Why:  Call and make an appointment to be seen about 4-5 weeks after discharge and discuss putting flluid back in the band.  You can gain weight again with the band deflated. Contact information: [address] [place] [address] [phone]        [facility], [place] Follow up in 1 week(s).   Specialty:  General Surgery Why:  For suture removal Contact information: [address] [place] [phone]       Schedule an appointment as soon as possible for a visit today with [place].   Contact information: [address]  [place] Hospital Course by problem list:  Partial bowel obstruction [facility]) Meagan Dean is a 45 yo female with past surgical history of lap band procedure, exploratory lapartomy/myomectomy and lap appendectomy for perforated appendicitis.  She presented on 7/7 with severe diffuse abdominal pain, nausea and persistent vomiting.  Abdominal X ray showed diffuse distention of small bowel loops, with scattered air-fluid levels, concerning for small bowel obstruction.  Nasogastric tube was placed.  Despite NG tube patient continued to endorse abdominal pain.  Two separate clamp trials for clear liquids were tried however, patient could not tolerate the liquids due to increasing abdominal pain and both times the NG tubes had to be restarted. Throughout admission patient's pain was managed with Dilaudid, Oxycodone or Morphine.  Patient did not have resolution of the partial bowel obstruction despite several days with NG tube and patient underwent exploratory laparotomy with lysis of adhesions on 7/14.  Patient slowly showed improvement in abdominal pain and was able to have several bowel movements.  She was able to tolerate clear liquids after a clamp trial and the NG tube was removed.  Prior to discharge patient was able to eat solid foods.  She was able to ambulate the hallways and bathe herself.  She was discharged with Oxycodone and promethazine and has follow up with surgery for suture removal.      Hypokalemia Patient's potassium was 2.5 on admission.  Throughout admission she would  intermittently be given IV Kdur to keep her potassium from dropping below normal range.  When patient was able to tolerate clear liquids she was switched to [place].  Her potassium was 3.6 on discharge and potassium tablets were sent to her pharmacy.  Patient was told to follow up in [facility] to address the chronic hypokalemia.  Discharge Vitals:   BP 101/64 (BP Location: Left Arm)   Pulse 79   Temp 97.6 F (36.4  C) (Oral)   Resp 18   Ht 5\' 2"  (1.575 m)   Wt 165 lb 3.2 oz (74.9 kg)   LMP 11/17/2015   SpO2 99%   BMI 30.22 kg/m    Pertinent Labs, Studies, and Procedures:  CT ABDOMEN AND PELVIS WITH CONTRAST IMPRESSION: 1. There is moderate gastric distension with oral contrast material and gas without evidence of gastric outlet obstruction. NG tube in place. 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 2. There are distended small bowel loops with air-fluid levels. There is transition in caliber of small bowel in mid lower abdomen axial image 62. Distal small bowel loops are decompressed small caliber. Findings are highly suspicious for small bowel obstruction. No any contrast material noted within distal small bowel or within colon. 3. There is no evidence of free abdominal air. Abundant pelvic ascites. 4. Subtle mild thickening of the wall of distal small bowel loops in right lower abdomen mild small bowel wall edema cannot be excluded. 5. Normal size anteflexed uterus. Question anterior wall uterine fibroid measures 2.4 cm please see sagittal image 73. Further correlation with pelvic ultrasound and GYN exam could be performed.  Discharge Instructions: Discharge Instructions    Call MD for:  difficulty breathing, headache or visual disturbances    Complete by:  As directed   Call MD for:  persistant nausea and vomiting    Complete by:  As directed   Call MD for:  redness, tenderness, or signs of infection (pain, swelling, redness, odor or green/yellow discharge around incision site)    Complete by:  As directed   Call MD for:  severe uncontrolled pain    Complete by:  As directed   Call MD for:  temperature >100.4    Complete by:  As directed   Diet - low sodium heart healthy    Complete by:  As directed   Diet - low sodium heart healthy    Complete by:  As directed   Discharge instructions    Complete by:  As directed   Driving Restrictions    Complete by:  As directed   No driving while on narcotic  pain medication   Increase activity slowly    Complete by:  As directed   Increase activity slowly    Complete by:  As directed   Lifting restrictions    Complete by:  As directed   No lifting greater than 20lb for 3 weeks      Signed: Valinda Party, DO 12/04/2015, 8:48 PM   Pager: [phone]

## 2015-11-28 NOTE — Progress Notes (Signed)
   Subjective: Meagan Dean was evaluated this morning on rounds and states that her abdominal pain has improved since yesterday.  She has had around 6-7 bowel movements in the last 24hrs.  Her pain is currently 7/10 with the PCA pump.  She has not been able to ambulate the halls in the last day afraid she may have accidents in the hallway.  She denies SOB or chest pain.    Objective: Vital signs in last 24 hours: Filed Vitals:   11/28/15 0000 11/28/15 0400 11/28/15 0511 11/28/15 0845  BP:   118/76   Pulse:   97   Temp:   98 F (36.7 C)   TempSrc:   Oral   Resp: 14 14 19 12   Height:      Weight:      SpO2: 100% 100% 100% 100%   Physical Exam Physical Exam  Constitutional: She is oriented to person, place, and time.  Laying on right side in mild distress  HENT:  Head: Normocephalic and atraumatic.  Cardiovascular: Normal rate, regular rhythm and normal heart sounds.   Pulmonary/Chest: Effort normal and breath sounds normal. No respiratory distress.  Abdominal:  Bowel sounds appreciated  Musculoskeletal: She exhibits no edema.  Neurological: She is alert and oriented to person, place, and time.  Skin: Skin is warm and dry.  Incision site shows no erythema or pus      Assessment/Plan: Partial bowel obstruction ([place]) Patient has had cramp like abdominal pain since surgery but states the pain is improving.  She has had several bowel movements starting yesterday afternoon. She stated using the PCA button early this morning with some benefit.  Per surgery's recommendations patient will try an NGT clamp trial and see how well she tolerates clear fluids.   - Per surgery she was taken off the PCA pump and has Dilaudid 0.5-1mg  Q3PRN - Ambulate with assistance Q4 - NGT clamp trial, clear fluids  Hypokalemia K is currently 3.0, KCl 55mEq is currently given in fluid.  Will reassess BMET in morning - BMET  Hypomagnesemia  Electrolyte imbalance may further delay bowel movement and since  patient was not having a bowel movement as of yesterday morning a Magnesium level was checked. Yesterday Magnesium was 1.5 Magnesum sulfate IVPB 2g was given. Helping correct the magnesium can also further improve the hypokalemia. This morning Magnesium level is within normal limits at 2.1.  No need to reassess further.         Dispo: Anticipated discharge in approximately 3 day(s).   LOS: 11 days   Meagan Party, DO 11/28/2015, 10:55 AM Pager: [phone]

## 2015-11-28 NOTE — Progress Notes (Signed)
4 Days Post-Op  Subjective: Pain well controlled. Nauseated this morning after pain medication. Passing flatus. Multiple bowel movements last night and today. VSS. NG output 750 mL. WBC trending down (11.2).  Objective: Vital signs in last 24 hours: Temp:  [98 F (36.7 C)-98.1 F (36.7 C)] 98 F (36.7 C) (07/18 0511) Pulse Rate:  [93-97] 97 (07/18 0511) Resp:  [12-25] 19 (07/18 0511) BP: (108-118)/(74-76) 118/76 mmHg (07/18 0511) SpO2:  [96 %-100 %] 100 % (07/18 0511) Last BM Date: 11/27/15  Intake/Output from previous day: 07/17 0701 - 07/18 0700 In: 1186.7 [I.V.:1186.7] Out: 750 [Emesis/NG output:750] Intake/Output this shift:   Physical Exam:  General appearance Supine. Sleepy.  Head: Normocephalic/atraumatic. PERRL bilaterally. Extraocular eye muscles in tact.  Neck: FROM Cardiovascular: Regular rate and rhythm without gallops murmurs or rubs Respiratory: Lung sounds vesicular. No wheezes, rales or rhonchi auscultated Abdomen: Soft and mildly tender without guarding, distension, masses, or rigidity. Bowel sounds positive in all four quadrants. Laparotomy incision without erythema, streaking or exudate. Incision closed with staples and uncovered.  Lab Results:   Recent Labs  11/27/15 1134 11/28/15 0317  WBC 14.9* 11.2*  HGB 11.0* 10.6*  HCT 33.1* 31.3*  PLT 531* 536*   BMET  Recent Labs  11/27/15 1134 11/28/15 0317  NA 135 135  K 3.3* 3.0*  CL 103 101  CO2 17* 24  GLUCOSE 80 136*  BUN <5* <5*  CREATININE 0.87 0.57  CALCIUM 8.5* 8.3*   PT/INR No results for input(s): LABPROT, INR in the last 72 hours. ABG No results for input(s): PHART, HCO3 in the last 72 hours.  Invalid input(s): PCO2, PO2  Studies/Results: No results found.  Anti-infectives: Anti-infectives    Start     Dose/Rate Route Frequency Ordered Stop   11/24/15 1000  [MAR Hold]  cefOXitin (MEFOXIN) 2 g in dextrose 5 % 50 mL IVPB     (MAR Hold since 11/24/15 0911)   2 g 100 mL/hr  over 30 Minutes Intravenous To [facility] 11/24/15 0738 11/24/15 1046      Assessment/Plan: Small bowel obstruction  POD #4 s/p Exploratory Laparotomy and Lysis of Adhesions by Dr. Georganna Skeans on 11/24/2015. Had bowel movements and passing flatus. NG clamping trial today.  FEN: Clear liquids  VTE: Lovenox  Dispo: Ileus   Lannie Fields [facility] 11/28/2015

## 2015-11-29 ENCOUNTER — Inpatient Hospital Stay (HOSPITAL_COMMUNITY): Payer: MEDICAID

## 2015-11-29 LAB — BASIC METABOLIC PANEL
ANION GAP: 7 (ref 5–15)
CHLORIDE: 101 mmol/L (ref 101–111)
CO2: 27 mmol/L (ref 22–32)
Calcium: 8 mg/dL — ABNORMAL LOW (ref 8.9–10.3)
Creatinine, Ser: 0.61 mg/dL (ref 0.44–1.00)
GFR calc Af Amer: >60 mL/min (ref >60)
GFR calc non Af Amer: >60 mL/min (ref >60)
GLUCOSE: 93 mg/dL (ref 65–99)
POTASSIUM: 3 mmol/L — AB (ref 3.5–5.1)
SODIUM: 135 mmol/L (ref 135–145)

## 2015-11-29 LAB — PHOSPHORUS: PHOSPHORUS: 2.6 mg/dL (ref 2.5–4.6)

## 2015-11-29 MED ORDER — MAGNESIUM SULFATE 2 GM/50ML IV SOLN
2.0000 g | Freq: Once | INTRAVENOUS | Status: AC
  Administered 2015-11-29: 2 g via INTRAVENOUS
  Filled 2015-11-29: qty 50

## 2015-11-29 MED ORDER — KCL IN DEXTROSE-NACL 40-5-0.45 MEQ/L-%-% IV SOLN
INTRAVENOUS | Status: AC
  Administered 2015-11-29: 12:00:00 via INTRAVENOUS
  Filled 2015-11-29 (×2): qty 1000

## 2015-11-29 MED ORDER — POLYETHYLENE GLYCOL 3350 17 G PO PACK: 17.0000 g | PACK | Freq: Two times a day (BID) | ORAL | Status: DC | PRN

## 2015-11-29 MED ORDER — SODIUM CHLORIDE 0.45 % IV SOLN
INTRAVENOUS | Status: AC
  Administered 2015-11-30: 01:00:00 via INTRAVENOUS

## 2015-11-29 MED ORDER — POTASSIUM PHOSPHATES 15 MMOLE/5ML IV SOLN
10.0000 mmol | Freq: Once | INTRAVENOUS | Status: AC
  Administered 2015-11-29: 10 mmol via INTRAVENOUS
  Filled 2015-11-29: qty 3.33

## 2015-11-29 MED ORDER — ONDANSETRON HCL 4 MG/2ML IJ SOLN
4.0000 mg | Freq: Three times a day (TID) | INTRAMUSCULAR | Status: DC
  Administered 2015-11-29 – 2015-12-01 (×6): 4 mg via INTRAVENOUS
  Filled 2015-11-29 (×6): qty 2

## 2015-11-29 MED ORDER — DOCUSATE SODIUM 50 MG/5ML PO LIQD: 100.0000 mg | Freq: Two times a day (BID) | ORAL | Status: DC | PRN

## 2015-11-29 MED ORDER — PROMETHAZINE HCL 25 MG/ML IJ SOLN
6.2500 mg | INTRAMUSCULAR | Status: DC | PRN
  Administered 2015-11-29 (×2): 6.25 mg via INTRAVENOUS
  Filled 2015-11-29 (×2): qty 1

## 2015-11-29 NOTE — Progress Notes (Signed)
5 Days Post-Op  Subjective: Pain not controlled. Very nauseated. No vomiting. Passing flatus. WBC 11.2 today, trending down from 14.9 on 11/27/15. VSS. Clears started yesterday. Did not drink much due to nausea.   Objective: Vital signs in last 24 hours: Temp:  [98.4 F (36.9 C)-98.9 F (37.2 C)] 98.4 F (36.9 C) (07/19 0517) Pulse Rate:  [80-86] 86 (07/19 0517) Resp:  [12-18] 17 (07/19 0517) BP: (96-107)/(63-75) 96/65 mmHg (07/19 0517) SpO2:  [98 %-100 %] 98 % (07/19 0517) Last BM Date: 11/27/15  Intake/Output from previous day: 07/18 0701 - 07/19 0700 In: 733.3 [I.V.:703.3; NG/GT:30] Out: 450 [Urine:300; Emesis/NG output:150] Intake/Output this shift: Total I/O In: 700 [I.V.:700] Out: -   General appearance  Supine. Sleepy. Head: Normocephalic/atraumatic. PERRL bilaterally. Extraocular eye muscles in tact.  Neck: FROM Cardiovascular: Regular rate and rhythm without gallops murmurs or rubs Respiratory: Lung sounds vesicular. No wheezes, rales or rhonchi auscultated Abdomen: No distension visualized. Soft and diffusely tender to light palpation. BS+. 3cmx3cm focal hardness to palpation in right upper quadrant. Laparotomy site visualized without streaking or erythema.   Lab Results:   Recent Labs  11/27/15 1134 11/28/15 0317  WBC 14.9* 11.2*  HGB 11.0* 10.6*  HCT 33.1* 31.3*  PLT 531* 536*   BMET  Recent Labs  11/28/15 0317 11/29/15 0217  NA 135 135  K 3.0* 3.0*  CL 101 101  CO2 24 27  GLUCOSE 136* 93  BUN <5* <5*  CREATININE 0.57 0.61  CALCIUM 8.3* 8.0*   PT/INR No results for input(s): LABPROT, INR in the last 72 hours. ABG No results for input(s): PHART, HCO3 in the last 72 hours.  Invalid input(s): PCO2, PO2  Studies/Results: No results found.  Anti-infectives: Anti-infectives    Start     Dose/Rate Route Frequency Ordered Stop   11/24/15 1000  [MAR Hold]  cefOXitin (MEFOXIN) 2 g in dextrose 5 % 50 mL IVPB     (MAR Hold since 11/24/15 0911)   2 g 100 mL/hr over 30 Minutes Intravenous To [facility] 11/24/15 0738 11/24/15 1046      Assessment/Plan: Small bowel obstruction  POD #5 s/p Exploratory Laparotomy and Lysis of Adhesions by Dr. Georganna Skeans on 11/24/2015. Passing flatus. Diffusely tender to palpation. Abdominal film this am. Will keep on clears due to nausea for now.  FEN: Clear liquids  VTE: Lovenox  Dispo: Ileus   Lannie Fields [facility] 11/29/2015

## 2015-11-29 NOTE — Progress Notes (Signed)
   Subjective: Ms. Butter was evaluated on rounds this morning.  She was laying in bed watching TV and she no longer has the NG tube.  She has not had a bowel movement since yesterday morning but states she has been able to pass gas.  She continues to have crampy like abdominal pain she rates an 8/10  And continues to endorse nausea.  She has been able to drink some apple juice and denies any vomiting. She ambulated the halls once yesterday.  She denies SOB or chest pain  Objective: Vital signs in last 24 hours: Filed Vitals:   11/28/15 0845 11/28/15 1322 11/28/15 2122 11/29/15 0517  BP:  107/75 96/63 96/65   Pulse:  82 80 86  Temp:  98.7 F (37.1 C) 98.9 F (37.2 C) 98.4 F (36.9 C)  TempSrc:  Oral Oral Oral  Resp: 12 18 17 17   Height:      Weight:      SpO2: 100% 100% 98% 98%   Physical Exam Physical Exam  Constitutional: She is oriented to person, place, and time.  NG tube taken out, facial distress has improved  HENT:  Head: Normocephalic and atraumatic.  Cardiovascular: Normal rate, regular rhythm and normal heart sounds.   Pulmonary/Chest: Effort normal and breath sounds normal. No respiratory distress.  Abdominal: There is tenderness. There is no guarding.  Slight bowel sounds present Tenderness in right upper quadrant  Musculoskeletal: She exhibits no edema.  Neurological: She is alert and oriented to person, place, and time.  Skin: Skin is warm and dry.  Wound looks clean, no drainage, no erythema noted  Psychiatric: Mood and affect normal.     Assessment/Plan: Partial bowel obstruction (HCC) Post op day 5.  Patient continues to complain of abdominal pain.  She has been able to pass gas but has not had a bowel movement since yesterday morning.  She has not had vomiting with the clear liquids and ileus appears to be resolving slowly.  She currently recieves Dilaudid 0.5-1.0mg  Q3PRN and has had 3 doses in the last 24hrs. And Oxycodone 5mg  Q4PRN and received 2 doses in  the last 24hrs.  She is on scheduled zofran 4mg  Q8 for her nausea.  Per surgeries recommendations will start patient on full liquid diet.  Patient had imaging showing a mild gaseous distended small bowel loops mid abdomen suspicious for ileus or partial bowel obstruction. Stable position of gastric band.  Patient was encouraged to continue ambulating. - Dilaudid 0.5-1.0mg  Q3PRN  - Oxycodone 5mg  Q4PRN  - zofran 4mg  Q8  - checking phosphorus level   Hypokalemia Patients K continues to be 3.0  -D5 1/2NS with KCL 48mEq/L for 12hrs  Hypomagnesemia Because K continues to be low, magnesium is likely low.  Will continue to give patient magnesium sulfate IVPB 2g 37ml    Dispo: Anticipated discharge in approximately 3 day(s).   LOS: 12 days   Valinda Party, DO 11/29/2015, 8:31 AM Pager: [phone]

## 2015-11-30 LAB — BASIC METABOLIC PANEL
Anion gap: 5 (ref 5–15)
CHLORIDE: 101 mmol/L (ref 101–111)
CO2: 28 mmol/L (ref 22–32)
Calcium: 8 mg/dL — ABNORMAL LOW (ref 8.9–10.3)
Creatinine, Ser: 0.67 mg/dL (ref 0.44–1.00)
GFR calc Af Amer: >60 mL/min (ref >60)
GFR calc non Af Amer: >60 mL/min (ref >60)
Glucose, Bld: 92 mg/dL (ref 65–99)
POTASSIUM: 3.4 mmol/L — AB (ref 3.5–5.1)
Sodium: 134 mmol/L — ABNORMAL LOW (ref 135–145)

## 2015-11-30 MED ORDER — POTASSIUM CHLORIDE CRYS ER 20 MEQ PO TBCR
40.0000 meq | EXTENDED_RELEASE_TABLET | Freq: Once | ORAL | Status: AC
  Administered 2015-11-30: 40 meq via ORAL
  Filled 2015-11-30: qty 2

## 2015-11-30 MED ORDER — POTASSIUM CHLORIDE 20 MEQ PO PACK
40.0000 meq | PACK | Freq: Once | ORAL | Status: DC
  Filled 2015-11-30 (×2): qty 2

## 2015-11-30 MED ORDER — OXYCODONE HCL 5 MG PO TABS: 5.0000 mg | ORAL_TABLET | ORAL | Status: DC | PRN

## 2015-11-30 MED ORDER — PROMETHAZINE HCL 12.5 MG PO TABS: 12.5000 mg | ORAL_TABLET | Freq: Four times a day (QID) | ORAL | Status: DC | PRN

## 2015-11-30 MED ORDER — OXYCODONE HCL 5 MG PO TABS
5.0000 mg | ORAL_TABLET | ORAL | Status: DC | PRN
  Administered 2015-11-30 – 2015-12-01 (×3): 10 mg via ORAL
  Filled 2015-11-30 (×3): qty 2

## 2015-11-30 NOTE — Progress Notes (Signed)
6 Days Post-Op  Subjective: Pain better controlled today. Nausea improved. Denies vomiting. Passed flatus last night and this morning. No BM. Full liquid diet well tolerated. VSS.   Objective: Vital signs in last 24 hours: Temp:  [98.1 F (36.7 C)-99.4 F (37.4 C)] 98.9 F (37.2 C) (07/20 0531) Pulse Rate:  [85-93] 93 (07/20 0531) Resp:  [16-18] 16 (07/20 0531) BP: (98-108)/(60-74) 98/60 mmHg (07/20 0531) SpO2:  [98 %] 98 % (07/20 0531) Last BM Date: 11/28/15  Intake/Output from previous day: 07/19 0701 - 07/20 0700 In: 1509.2 [I.V.:1509.2] Out: -  Intake/Output this shift:    General appearance  Supine. Awake and talkative.  Head: Normocephalic/atraumatic. PERRL bilaterally. Extraocular eye muscles in tact.  Neck: FROM Cardiovascular: Regular rate and rhythm without gallops, murmurs or rubs Respiratory: Lung sounds vesicular. No wheezes, crackles or rhonchi auscultated Abdomen: Soft without guarding, masses, or rigidity. Diffuse tenderness to palpation, improved since yesterday. Laparotomy incision visualized. +BS    Lab Results:   Recent Labs  11/27/15 1134 11/28/15 0317  WBC 14.9* 11.2*  HGB 11.0* 10.6*  HCT 33.1* 31.3*  PLT 531* 536*   BMET  Recent Labs  11/29/15 0217 11/30/15 0515  NA 135 134*  K 3.0* 3.4*  CL 101 101  CO2 27 28  GLUCOSE 93 92  BUN <5* <5*  CREATININE 0.61 0.67  CALCIUM 8.0* 8.0*   PT/INR No results for input(s): LABPROT, INR in the last 72 hours. ABG No results for input(s): PHART, HCO3 in the last 72 hours.  Invalid input(s): PCO2, PO2  Studies/Results: Dg Abd Portable 1v  11/29/2015  CLINICAL DATA:  Abdominal pain EXAM: PORTABLE ABDOMEN - 1 VIEW COMPARISON:  11/23/2015 FINDINGS: Mild gaseous distended small bowel loops mid abdomen suspicious for ileus or partial bowel obstruction. Again noted gastric band unchanged in position. Surgical clips in right lower quadrant again noted. Some colonic gas noted in transverse colon and  descending colon. The NG tube has been removed. Midline lower abdomen and pelvis skin staples. IMPRESSION: Mild gaseous distended small bowel loops mid abdomen suspicious for ileus or partial bowel obstruction. Stable position of gastric band. NG tube has been removed. Electronically Signed   By: Lahoma Crocker M.D.   On: 11/29/2015 10:13    Anti-infectives: Anti-infectives    Start     Dose/Rate Route Frequency Ordered Stop   11/24/15 1000  [MAR Hold]  cefOXitin (MEFOXIN) 2 g in dextrose 5 % 50 mL IVPB     (MAR Hold since 11/24/15 0911)   2 g 100 mL/hr over 30 Minutes Intravenous To [facility] 11/24/15 0738 11/24/15 1046      Assessment/Plan: Small bowel obstruction  POD #6 s/p Exploratory Laparotomy and Lysis of Adhesions by Dr. Georganna Skeans on 11/24/2015. Passing flatus. Pain better controlled. Advance to soft diet. Abdominal film indicated mild distended small bowel loops, consistent with residual ileus.  FEN: Soft diet.  VTE: Lovenox  Dispo: If soft diet is tolerated, home today.   Lannie Fields PASII 11/30/2015

## 2015-11-30 NOTE — Progress Notes (Signed)
Nutrition Follow-up  DOCUMENTATION CODES:   Obesity unspecified  INTERVENTION:  -RD to continue to monitor -PT declined ONS  NUTRITION DIAGNOSIS:   Inadequate oral intake related to altered GI function as evidenced by NPO status. -resolving  GOAL:   Patient will meet greater than or equal to 90% of their needs -progressing  MONITOR:   Diet advancement, Labs, Weight trends, Skin, I & O's  REASON FOR ASSESSMENT:   NPO/Clear Liquid Diet    ASSESSMENT:   Meagan Dean is a 45 yo female with PMHx of lap band procedure in 2010, ureterolithasis, perforated appendicitis s/p lap appendectomy in March 2016, and fibroids who presented to the ED last night with complaint of vomiting and abdominal pain. Patient states she felt slightly nauseous yesterday and constipated. Her symptoms were progressive through the night to the point where she was in severe diffuse abdominal pain, nauseous and with persistent vomiting. She also felt bloated and could not have a bowel movement. She denied any fever, chills, chest pain, shortness of breath, weakness, lightheadedness, blood in her stools, or dysuria. However, in the ED, she later developed diarrhea with 4 watery bowel movements  -Meagan Dean is feeling a little better -Still having some nausea and pain -PO improving but only because she is making herself eat, food still tastes bad to her. -Had a liquid tray sitting in her room this morning she consumed about half of. Advanced to soft diet now, will follow for PO intake. -Declined ONS at this time. -No new Wts  Labs and medications reviewed: Na 134, K 3.4, KCL 6mEq; Colace; Miralax; Zofran  Diet Order:  DIET SOFT Room service appropriate?: Yes; Fluid consistency:: Thin  Skin:  Reviewed, no issues  Last BM:  11/21/15  Height:   Ht Readings from Last 1 Encounters:  11/17/15 5\' 2"  (1.575 m)    Weight:   Wt Readings from Last 1 Encounters:  11/17/15 165 lb 3.2 oz (74.934 kg)    Ideal  Body Weight:  50 kg  BMI:  Body mass index is 30.21 kg/(m^2).  Estimated Nutritional Needs:   Kcal:  1600-1800  Protein:  80-95 grams  Fluid:  1.6-1.8 L  EDUCATION NEEDS:   No education needs identified at this time  Satira Anis. Dandrae Kustra, MS, RD LDN Inpatient Clinical Dietitian Pager [phone]

## 2015-11-30 NOTE — Discharge Instructions (Signed)
[facility]      [facility] [phone]  OPEN ABDOMINAL SURGERY: POST OP INSTRUCTIONS  Always review your discharge instruction sheet given to you by the facility where your surgery was performed.  IF YOU HAVE DISABILITY OR FAMILY LEAVE FORMS, YOU MUST BRING THEM TO THE OFFICE FOR PROCESSING.  PLEASE DO NOT GIVE THEM TO YOUR DOCTOR.  1. A prescription for pain medication may be given to you upon discharge.  Take your pain medication as prescribed, if needed.  If narcotic pain medicine is not needed, then you may take acetaminophen (Tylenol) or ibuprofen (Advil) as needed. 2. Take your usually prescribed medications unless otherwise directed. 3. If you need a refill on your pain medication, please contact your pharmacy. They will contact our office to request authorization.  Prescriptions will not be filled after 5pm or on week-ends. 4. You should follow a light diet the first few days after arrival home, such as soup and crackers, pudding, etc.unless your doctor has advised otherwise. A high-fiber, low fat diet can be resumed as tolerated.   Be sure to include lots of fluids daily. Most patients will experience some swelling and bruising on the chest and neck area.  Ice packs will help.  Swelling and bruising can take several days to resolve 5. Most patients will experience some swelling and bruising in the area of the incision. Ice pack will help. Swelling and bruising can take several days to resolve..  6. It is common to experience some constipation if taking pain medication after surgery.  Increasing fluid intake and taking a stool softener will usually help or prevent this problem from occurring.  A mild laxative (Milk of Magnesia or Miralax) should be taken according to package directions if there are no bowel movements after 48 hours. 7.  You may have steri-strips (small skin tapes) in place directly over the incision.  These strips should be left on the skin for 7-10 days.  If your  surgeon used skin glue on the incision, you may shower in 24 hours.  The glue will flake off over the next 2-3 weeks.  Any sutures or staples will be removed at the office during your follow-up visit. You may find that a light gauze bandage over your incision may keep your staples from being rubbed or pulled. You may shower and replace the bandage daily. 8. ACTIVITIES:  You may resume regular (light) daily activities beginning the next day--such as daily self-care, walking, climbing stairs--gradually increasing activities as tolerated.  You may have sexual intercourse when it is comfortable.  Refrain from any heavy lifting or straining until approved by your doctor. a. You may drive when you no longer are taking prescription pain medication, you can comfortably wear a seatbelt, and you can safely maneuver your car and apply brakes b. Return to Work: ___________________________________ 9. You should see your doctor in the office for a follow-up appointment approximately two weeks after your surgery.  Make sure that you call for this appointment within a day or two after you arrive home to insure a convenient appointment time. OTHER INSTRUCTIONS:  _____________________________________________________________ _____________________________________________________________  WHEN TO CALL YOUR DOCTOR: 1. Fever over 101.0 2. Inability to urinate 3. Nausea and/or vomiting 4. Extreme swelling or bruising 5. Continued bleeding from incision. 6. Increased pain, redness, or drainage from the incision. 7. Difficulty swallowing or breathing 8. Muscle cramping or spasms. 9. Numbness or tingling in hands or feet or around lips.  The clinic staff is available to   answer your questions during regular business hours.  Please don't hesitate to call and ask to speak to one of the nurses if you have concerns.  For further questions, please visit [email or web address]   

## 2015-11-30 NOTE — Progress Notes (Signed)
   Subjective: Meagan Dean was evaluated on rounds this morning.  She was sitting up comfortably in bed.  She was able to eat some grits and consume fluids without having nausea or vomiting.  She states she has been able to pass gas but has not had a bowel movement in the last 24hrs.  She reports ambulating the halls and feels that her abdominal pain is improving.  She denies SOB or chest pain.  Objective: Vital signs in last 24 hours: Filed Vitals:   11/29/15 1451 11/29/15 2129 11/30/15 0531 11/30/15 1355  BP: 104/74 108/72 98/60 109/70  Pulse: 85 89 93 90  Temp: 98.1 F (36.7 C) 99.4 F (37.4 C) 98.9 F (37.2 C) 97.9 F (36.6 C)  TempSrc: Oral Oral Oral Oral  Resp: 18 17 16 17   Height:      Weight:      SpO2: 98% 98% 98% 100%   Physical Exam Physical Exam  Constitutional: She is oriented to person, place, and time.  Appears more alert than previous day   HENT:  Head: Normocephalic and atraumatic.  Cardiovascular: Normal rate, regular rhythm and normal heart sounds.   Pulmonary/Chest: Effort normal and breath sounds normal. No respiratory distress.  Abdominal: Bowel sounds are normal. There is tenderness. There is no guarding.  Musculoskeletal: She exhibits no edema.  Neurological: She is alert and oriented to person, place, and time.  Skin: Skin is warm and dry.  Psychiatric: Mood and affect normal.     Assessment/Plan: Partial bowel obstruction ([place]) Patient is able to tolerate soft food diet.  Nausea has improved and patient denies vomiting.  Per surgeries recommendations if patient can tolerate lunch she may be ready for discharge from their stand point. - Incentive spirometry - PT/OT evaluate and treat  Hypokalemia Currently K 3.4  - 40 mEq Kdur - BMET      Dispo: Anticipated discharge in approximately 1 day(s).   LOS: 13 days   Valinda Party, DO 11/30/2015, 3:55 PM Pager: [phone]

## 2015-12-01 LAB — BASIC METABOLIC PANEL
Anion gap: 6 (ref 5–15)
CHLORIDE: 100 mmol/L — AB (ref 101–111)
CO2: 30 mmol/L (ref 22–32)
Calcium: 8.5 mg/dL — ABNORMAL LOW (ref 8.9–10.3)
Creatinine, Ser: 0.66 mg/dL (ref 0.44–1.00)
GFR calc Af Amer: >60 mL/min (ref >60)
GFR calc non Af Amer: >60 mL/min (ref >60)
Glucose, Bld: 95 mg/dL (ref 65–99)
POTASSIUM: 3.6 mmol/L (ref 3.5–5.1)
SODIUM: 136 mmol/L (ref 135–145)

## 2015-12-01 MED ORDER — POTASSIUM CHLORIDE ER 20 MEQ PO TBCR: 20.0000 meq | EXTENDED_RELEASE_TABLET | Freq: Every day | ORAL | Status: DC

## 2015-12-01 NOTE — Progress Notes (Signed)
   Subjective: Ms. Berth was evaluated this morning.  She was eating breakfast and denied nausea or vomiting.  She has continued to have flatus.  She is ambulating well in the hallways and was able to wash up on her own this morning.  She has mild abdominal pain that is being well controlled with oral medication.  She denies SOB and chest pain  Objective: Vital signs in last 24 hours: Filed Vitals:   11/30/15 0531 11/30/15 1355 11/30/15 2208 12/01/15 0454  BP: 98/60 109/70 107/63 101/64  Pulse: 93 90 84 79  Temp: 98.9 F (37.2 C) 97.9 F (36.6 C) 98 F (36.7 C) 97.6 F (36.4 C)  TempSrc: Oral Oral Oral Oral  Resp: 16 17 18 18   Height:      Weight:      SpO2: 98% 100% 100% 99%   Physical Exam Physical Exam  Constitutional: She is oriented to person, place, and time and well-developed, well-nourished, and in no distress.  HENT:  Head: Normocephalic and atraumatic.  Eyes: Pupils are equal, round, and reactive to light.  Cardiovascular: Normal rate, regular rhythm and normal heart sounds.   Pulmonary/Chest: Effort normal and breath sounds normal. No respiratory distress.  Abdominal: Bowel sounds are normal. There is tenderness.  Musculoskeletal: She exhibits no edema.  Neurological: She is alert and oriented to person, place, and time.  Skin: Skin is warm and dry.     Assessment/Plan: Partial bowel obstruction (HCC) Day 7 post op.  Patient is able to tolerate solids and liquids and abdominal pain is currently well managed with oral oxycodone 5 -10mg  Q4PRN. -Discharge to home - Patient has follow up with surgery in one week for suture removal - Oxycodon 5mg  Q4PRN -30 tablets for 5 days  - Follow up with [facility], patient will be contacted  Hypokalemia Currently K 3.6.  Patient has a history of hypokalemia.  BMP on hospital follow up clinic visit.  - Discharged on Potassium Chloride 20 mEq .      Dispo: Anticipated discharge today.  LOS: 14 days   Valinda Party,  DO 12/01/2015, 12:51 PM Pager: [phone]

## 2015-12-01 NOTE — Care Management Note (Signed)
Case Management Note  Patient Details  Name: Meagan Dean MRN: [id number] Date of Birth: [birth year]  Subjective/Objective:                    Action/Plan:  Provided [place] letter with 14 day exception for oxycodone IR . Patient and visitor voiced understanding. Provided [employer] and [facility] information, however, patient stated she is following up in Internal Medicine Clinic here at [facility] . Expected Discharge Date:                  Expected Discharge Plan:  Home/Self Care  In-House Referral:     Discharge planning Services  CM Consult, Medication Assistance  Post Acute Care Choice:    Choice offered to:  Patient  DME Arranged:    DME Agency:     HH Arranged:    [place] Agency:     Status of Service:  Completed, signed off  If discussed at [employer] of Stay Meetings, dates discussed:    Additional Comments:  Marilu Favre, RN 12/01/2015, 10:23 AM

## 2015-12-01 NOTE — Progress Notes (Signed)
Discharge paperwork given to patient. Prescription given. Patient ready for discharge.

## 2015-12-01 NOTE — Progress Notes (Signed)
7 Days Post-Op  Subjective: Abdominal pain better today than yesterday. Still nauseated but overall improved. Passing flatus. Ambulating. Tolerated soft diet. VSS. Anticipating going home today. Good social support from friend.   Objective: Vital signs in last 24 hours: Temp:  [97.6 F (36.4 C)-98 F (36.7 C)] 97.6 F (36.4 C) (07/21 0454) Pulse Rate:  [79-90] 79 (07/21 0454) Resp:  [17-18] 18 (07/21 0454) BP: (101-109)/(63-70) 101/64 mmHg (07/21 0454) SpO2:  [99 %-100 %] 99 % (07/21 0454) Last BM Date: 11/28/15  Intake/Output from previous day: 07/20 0701 - 07/21 0700 In: 660 [P.O.:660] Out: -  Intake/Output this shift:   General appearance Supine. Awake and talkative.  Head: Normocephalic/atraumatic. PERRL bilaterally. Extraocular eye muscles in tact.  Neck: FROM Cardiovascular: Regular rate and rhythm without gallops, murmurs or rubs Respiratory: Lung sounds vesicular. No wheezes, crackles or rhonchi auscultated Abdomen: Soft without guarding, masses, or rigidity. Expected tenderness to palpation. Laparotomy incision visualized. +BS   Lab Results:  No results for input(s): WBC, HGB, HCT, PLT in the last 72 hours. BMET  Recent Labs  11/30/15 0515 12/01/15 0209  NA 134* 136  K 3.4* 3.6  CL 101 100*  CO2 28 30  GLUCOSE 92 95  BUN <5* <5*  CREATININE 0.67 0.66  CALCIUM 8.0* 8.5*   PT/INR No results for input(s): LABPROT, INR in the last 72 hours. ABG No results for input(s): PHART, HCO3 in the last 72 hours.  Invalid input(s): PCO2, PO2  Studies/Results: Dg Abd Portable 1v  11/29/2015  CLINICAL DATA:  Abdominal pain EXAM: PORTABLE ABDOMEN - 1 VIEW COMPARISON:  11/23/2015 FINDINGS: Mild gaseous distended small bowel loops mid abdomen suspicious for ileus or partial bowel obstruction. Again noted gastric band unchanged in position. Surgical clips in right lower quadrant again noted. Some colonic gas noted in transverse colon and descending colon. The NG tube  has been removed. Midline lower abdomen and pelvis skin staples. IMPRESSION: Mild gaseous distended small bowel loops mid abdomen suspicious for ileus or partial bowel obstruction. Stable position of gastric band. NG tube has been removed. Electronically Signed   By: Lahoma Crocker M.D.   On: 11/29/2015 10:13    Anti-infectives: Anti-infectives    Start     Dose/Rate Route Frequency Ordered Stop   11/24/15 1000  [MAR Hold]  cefOXitin (MEFOXIN) 2 g in dextrose 5 % 50 mL IVPB     (MAR Hold since 11/24/15 0911)   2 g 100 mL/hr over 30 Minutes Intravenous To [facility] 11/24/15 0738 11/24/15 1046      Assessment/Plan:  Small bowel obstruction  POD #7 s/p Exploratory Laparotomy and Lysis of Adhesions by Dr. Georganna Skeans on 11/24/2015. Advanced to diet as tolerated. Ambulation encouraged.  FEN: Diet as tolerated VTE: Lovenox  Dispo: Home today   Lannie Fields PASII 12/01/2015

## 2015-12-15 ENCOUNTER — Encounter: Payer: Self-pay | Admitting: Internal Medicine

## 2015-12-15 ENCOUNTER — Ambulatory Visit (INDEPENDENT_AMBULATORY_CARE_PROVIDER_SITE_OTHER): Payer: Self-pay | Admitting: Internal Medicine

## 2015-12-15 VITALS — BP 109/73 | HR 92 | Temp 97.8°F | Ht 62.0 in | Wt 161.1 lb

## 2015-12-15 DIAGNOSIS — K56600 Partial intestinal obstruction, unspecified as to cause: Secondary | ICD-10-CM

## 2015-12-15 DIAGNOSIS — R197 Diarrhea, unspecified: Secondary | ICD-10-CM | POA: Insufficient documentation

## 2015-12-15 DIAGNOSIS — E876 Hypokalemia: Secondary | ICD-10-CM

## 2015-12-15 DIAGNOSIS — K5669 Other intestinal obstruction: Secondary | ICD-10-CM

## 2015-12-15 NOTE — Assessment & Plan Note (Addendum)
Patient was recently admitted with a partial small bowel obstruction which failed to improve with conservative therapy and NGT placement. Patient underwent exploratory laparotomy with lysis of adhesions and improved clinically.  Patient continues to have nausea, generalized abdominal pain. She has also develop loose, watery stools- up to 7 bowel movement per day. She denies vomiting. She denies fever or chills. Given recently hospitalization and exposure to antibiotics, I have concern for C. Diff. Will check a C. Diff PCR and Toxin. However, if negative, diarrhea is likely functional in nature.  Plan: -Check C. Diff PCR and Toxin   Addendum: Patient did not leave stool specimen. I called patient to check on her and she states her bowel movements have become less watery and less frequent. She reports 3 bowel movements per day. We discussed return precautions.

## 2015-12-15 NOTE — Assessment & Plan Note (Addendum)
Patient has a history of chronic hypokalemia of unclear etiology. Recent hypokalemia is explained due to SBO and diarrhea. She reports compliance with KDur 20 meq daily.  Plan: -Check BMET -Continue KDur 20 mEq -Would consider work up for etiology if hypokalemia persists after resolution of diarrhea  Addendum: -Potassium 4.0 -Continue KDur 20 mEq daily

## 2015-12-15 NOTE — Patient Instructions (Signed)
CONTINUE TAKING YOUR MEDICATIONS AS PRESCRIBED.  WE WILL CHECK YOUR BLOOD WORK TODAY.   PLEASE BRING IN A STOOL SAMPLE ON Monday IF YOU CONTINUE TO HAVE MORE THAN 5 WATERY STOOLS PER DAY

## 2015-12-15 NOTE — Assessment & Plan Note (Signed)
Patient was recently admitted with a partial small bowel obstruction which failed to improved with conservative therapy and NGT placement. Patient underwent exploratory laparotomy with lysis of adhesions and improved clinically. She followed up with her general surgeon, Dr. Excell Seltzer, today. Patient continues to have nausea, generalized abdominal pain. She has also develop loose, watery stools- up to 7 bowel movement per day. She denies vomiting. She denies fever or chills. Overall, she does feel that she is improving slowly.   Plan: -Phenergan for nausea -Continue oxycodone for pain, would refill is necessary

## 2015-12-15 NOTE — Progress Notes (Signed)
Medicine attending: Medical history, presenting problems, physical findings, and medications, reviewed with resident physician Dr Alexa Burns on the day of the patient visit and I concur with her evaluation and management plan. 

## 2015-12-15 NOTE — Progress Notes (Signed)
    CC: follow up for hypokalemia and SBO  HPI: [name] is a 45 y.o. female with PMHx of partial SBO, Chronic Hypokalemia, and h/o lap band placement who presents to the clinic for follow up for partial SBO and hypokalemia.  Patient was recently admitted with a partial small bowel obstruction which failed to improved with conservative therapy and NGT placement. Patient underwent exploratory laparotomy with lysis of adhesions and improved clinically. She followed up with her general surgeon, Dr. Excell Seltzer, today. Patient continues to have nausea, generalized abdominal pain. She has also develop loose, watery stools- up to 7 bowel movement per day. She denies vomiting. She denies fever or chills. Overall, she does feel that she is improving slowly.   Past Medical History:  Diagnosis Date  . Asthma due to seasonal allergies    MILD--  NO INHALER  . Endometriosis of pelvis   . GERD (gastroesophageal reflux disease)   . H/O hiatal hernia   . History of hypertension    NO ISSUES SINCE WT LOSS AFTER GASTRIC BANDING  . History of obstructive sleep apnea    DX'D PRIOR TO GASTRIC BANDING  2010 --  NO ISSUES SINCE WT LOSS  . Renal calculus, left    NON-OBSTRUCTING  . Right ureteral stone   . Urgency of urination    Review of Systems: A complete ROS was negative except as noted in HPI.   Physical Exam: Vitals:   12/15/15 1541  BP: 109/73  Pulse: 92  Temp: 97.8 F (36.6 C)  TempSrc: Oral  SpO2: 100%  Weight: 161 lb 1.6 oz (73.1 kg)  Height: 5\' 2"  (1.575 m)   General: Vital signs reviewed.  Patient is well-developed and well-nourished, in no acute distress and cooperative with exam.  Cardiovascular: RRR, S1 normal, S2 normal, no murmurs, gallops, or rubs. Pulmonary/Chest: Clear to auscultation bilaterally, no wheezes, rales, or rhonchi. Abdominal: Soft, diffusely mildly tender, non-distended, BS +, no masses, organomegaly, or guarding present. Surgical incision sites well  healed. Extremities: No lower extremity edema bilaterally, pulses symmetric and intact bilaterally.  Skin: Warm, dry and intact. No rashes or erythema. Psychiatric: Normal mood and affect. speech and behavior is normal. Cognition and memory are normal.   Assessment & Plan:  See encounters tab for problem based medical decision making. Patient discussed with Dr. Beryle Beams.

## 2015-12-16 LAB — BMP8+ANION GAP
Anion Gap: 17 mmol/L (ref 10.0–18.0)
BUN / CREAT RATIO: 10 (ref 9–23)
BUN: 7 mg/dL (ref 6–24)
CO2: 23 mmol/L (ref 18–29)
CREATININE: 0.69 mg/dL (ref 0.57–1.00)
Calcium: 8.7 mg/dL (ref 8.7–10.2)
Chloride: 102 mmol/L (ref 96–106)
GFR calc Af Amer: 122 mL/min/{1.73_m2} (ref >59)
GFR, EST NON AFRICAN AMERICAN: 106 mL/min/{1.73_m2} (ref >59)
Glucose: 112 mg/dL — ABNORMAL HIGH (ref 65–99)
Potassium: 4 mmol/L (ref 3.5–5.2)
SODIUM: 142 mmol/L (ref 134–144)

## 2015-12-16 LAB — CBC
HEMATOCRIT: 30.7 % — AB (ref 34.0–46.6)
Hemoglobin: 10 g/dL — ABNORMAL LOW (ref 11.1–15.9)
MCH: 27.1 pg (ref 26.6–33.0)
MCHC: 32.6 g/dL (ref 31.5–35.7)
MCV: 83 fL (ref 79–97)
PLATELETS: 484 10*3/uL — AB (ref 150–379)
RBC: 3.69 x10E6/uL — ABNORMAL LOW (ref 3.77–5.28)
RDW: 15.7 % — AB (ref 12.3–15.4)
WBC: 7.4 10*3/uL (ref 3.4–10.8)

## 2015-12-22 NOTE — Addendum Note (Signed)
Addended by: Martyn Malay R on: 12/22/2015 12:38 PM   Modules accepted: Orders

## 2016-01-02 DIAGNOSIS — Z8669 Personal history of other diseases of the nervous system and sense organs: Secondary | ICD-10-CM | POA: Diagnosis present

## 2016-01-29 ENCOUNTER — Ambulatory Visit (INDEPENDENT_AMBULATORY_CARE_PROVIDER_SITE_OTHER): Payer: Self-pay | Admitting: *Deleted

## 2016-01-29 DIAGNOSIS — Z111 Encounter for screening for respiratory tuberculosis: Secondary | ICD-10-CM

## 2016-01-29 DIAGNOSIS — Z23 Encounter for immunization: Secondary | ICD-10-CM

## 2016-01-31 LAB — TB SKIN TEST
Induration: 0 mm
TB Skin Test: NEGATIVE

## 2016-07-14 ENCOUNTER — Encounter (HOSPITAL_COMMUNITY): Payer: Self-pay

## 2016-07-14 ENCOUNTER — Emergency Department (HOSPITAL_COMMUNITY)
Admission: EM | Admit: 2016-07-14 | Discharge: 2016-07-14 | Disposition: A | Discharge status: Home / Self Care | Payer: Self-pay | Attending: Emergency Medicine | Admitting: Emergency Medicine

## 2016-07-14 ENCOUNTER — Emergency Department (HOSPITAL_COMMUNITY): Payer: Self-pay

## 2016-07-14 DIAGNOSIS — I10 Essential (primary) hypertension: Secondary | ICD-10-CM | POA: Insufficient documentation

## 2016-07-14 DIAGNOSIS — R1030 Lower abdominal pain, unspecified: Secondary | ICD-10-CM

## 2016-07-14 DIAGNOSIS — J45909 Unspecified asthma, uncomplicated: Secondary | ICD-10-CM | POA: Insufficient documentation

## 2016-07-14 DIAGNOSIS — E876 Hypokalemia: Secondary | ICD-10-CM | POA: Insufficient documentation

## 2016-07-14 LAB — COMPREHENSIVE METABOLIC PANEL
ALT: 11 U/L — ABNORMAL LOW (ref 14–54)
ANION GAP: 7 (ref 5–15)
AST: 16 U/L (ref 15–41)
Albumin: 3.7 g/dL (ref 3.5–5.0)
Alkaline Phosphatase: 59 U/L (ref 38–126)
BUN: 8 mg/dL (ref 6–20)
CO2: 23 mmol/L (ref 22–32)
Calcium: 9.2 mg/dL (ref 8.9–10.3)
Chloride: 109 mmol/L (ref 101–111)
Creatinine, Ser: 0.8 mg/dL (ref 0.44–1.00)
GFR calc Af Amer: >60 mL/min (ref >60)
Glucose, Bld: 87 mg/dL (ref 65–99)
Potassium: 3 mmol/L — ABNORMAL LOW (ref 3.5–5.1)
Sodium: 139 mmol/L (ref 135–145)
TOTAL PROTEIN: 7.4 g/dL (ref 6.5–8.1)

## 2016-07-14 LAB — LIPASE, BLOOD: Lipase: 28 U/L (ref 11–51)

## 2016-07-14 LAB — CBC
HEMATOCRIT: 38.7 % (ref 36.0–46.0)
HEMOGLOBIN: 12.7 g/dL (ref 12.0–15.0)
MCH: 28.2 pg (ref 26.0–34.0)
MCHC: 32.8 g/dL (ref 30.0–36.0)
MCV: 86 fL (ref 78.0–100.0)
Platelets: 450 10*3/uL — ABNORMAL HIGH (ref 150–400)
RBC: 4.5 MIL/uL (ref 3.87–5.11)
RDW: 13.5 % (ref 11.5–15.5)
WBC: 9.1 10*3/uL (ref 4.0–10.5)

## 2016-07-14 LAB — I-STAT BETA HCG BLOOD, ED (MC, WL, AP ONLY): I-stat hCG, quantitative: <5 m[IU]/mL (ref <5)

## 2016-07-14 MED ORDER — DOCUSATE SODIUM 100 MG PO CAPS: 100.0000 mg | ORAL_CAPSULE | Freq: Two times a day (BID) | ORAL | 0 refills | Status: DC

## 2016-07-14 MED ORDER — POTASSIUM CHLORIDE CRYS ER 20 MEQ PO TBCR: 20.0000 meq | EXTENDED_RELEASE_TABLET | Freq: Two times a day (BID) | ORAL | 0 refills | Status: DC

## 2016-07-14 MED ORDER — TRAMADOL HCL 50 MG PO TABS
50.0000 mg | ORAL_TABLET | Freq: Once | ORAL | Status: AC
  Administered 2016-07-14: 50 mg via ORAL
  Filled 2016-07-14: qty 1

## 2016-07-14 MED ORDER — IOPAMIDOL (ISOVUE-300) INJECTION 61%
INTRAVENOUS | Status: AC
  Administered 2016-07-14: 100 mL
  Filled 2016-07-14: qty 100

## 2016-07-14 MED ORDER — KETOROLAC TROMETHAMINE 30 MG/ML IJ SOLN
30.0000 mg | Freq: Once | INTRAMUSCULAR | Status: AC
  Administered 2016-07-14: 30 mg via INTRAVENOUS
  Filled 2016-07-14: qty 1

## 2016-07-14 MED ORDER — TRAMADOL HCL 50 MG PO TABS: 50.0000 mg | ORAL_TABLET | Freq: Four times a day (QID) | ORAL | 0 refills | Status: DC | PRN

## 2016-07-14 MED ORDER — SODIUM CHLORIDE 0.9 % IV BOLUS (SEPSIS)
1000.0000 mL | Freq: Once | INTRAVENOUS | Status: AC
  Administered 2016-07-14: 1000 mL via INTRAVENOUS

## 2016-07-14 NOTE — ED Triage Notes (Signed)
Patient complains of abdominal pain with back pain and some vaginal bleeding since Friday. 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 Thinks she pulled something or has internal injury, skin warm and dry, VSS

## 2016-07-14 NOTE — Discharge Instructions (Signed)
You have been seen in the Emergency Department (ED) for abdominal pain.  Your evaluation showed an small hernia near the belly button and a fibroid. One or both of these may be contributing to your pain. I have provided the contact information for an OB/Gyn and General Surgeon to address the above problems in the clinic.   I am also giving you a small amount of potassium as your level today was 3. Take this as directed for two day and have it re-checked by your PCP in the coming week.   Please follow up as instructed above regarding today?s emergent visit and the symptoms that are bothering you.  Return to the ED if your abdominal pain worsens or fails to improve, you develop bloody vomiting, bloody diarrhea, you are unable to tolerate fluids due to vomiting, fever greater than 101, or other symptoms that concern you.

## 2016-07-14 NOTE — ED Provider Notes (Signed)
Emergency Department Provider Note   I have reviewed the triage vital signs and the nursing notes.   HISTORY  Chief Complaint Abdominal Pain and Back Pain   HPI Meagan Dean is a 46 y.o. female with PMH of GERD, HTN, and prior history of fibroids presents to the ED for evaluation of of abdominal and pelvic pain for the last 3 days. Patient states that her pain begins in the midabdomen and radiates down into her pelvis and occasionally her rectum. She reports symptoms are worse with palpation or movement. No change with eating. She has some associated nausea but no vomiting or diarrhea. No fevers or chills. She did have surgery on her abdomen for small bowel obstruction in July of last year which is got her concerned. No chest pain or difficulty breathing. No radiation of pain other than what is described. She describes pain as severe and intermittent.    Past Medical History:  Diagnosis Date  . Asthma due to seasonal allergies    MILD--  NO INHALER  . Endometriosis of pelvis   . GERD (gastroesophageal reflux disease)   . H/O hiatal hernia   . History of hypertension    NO ISSUES SINCE WT LOSS AFTER GASTRIC BANDING  . History of obstructive sleep apnea    DX'D PRIOR TO GASTRIC BANDING  2010 --  NO ISSUES SINCE WT LOSS  . Renal calculus, left    NON-OBSTRUCTING  . Right ureteral stone   . Urgency of urination     Patient Active Problem List   Diagnosis Date Noted  . History of obstructive sleep apnea   . Diarrhea 12/15/2015  . History of laparoscopic adjustable gastric banding   . Partial small bowel obstruction 11/17/2015  . Screen for STD (sexually transmitted disease) 06/27/2015  . Routine health maintenance 04/03/2015  . Perforated appendicitis s/p lap appy 07/28/2014 07/28/2014  . Hypokalemia 06/17/2013  . Calculus of ureter 06/16/2013    Past Surgical History:  Procedure Laterality Date  . CYSTOSCOPY WITH RETROGRADE PYELOGRAM, URETEROSCOPY AND STENT PLACEMENT  Right 06/18/2013   Procedure: CYSTOSCOPY WITH RETROGRADE PYELOGRAM, URETEROSCOPY AND STENT PLACEMENT;  Surgeon: Franchot Gallo, MD;  Location: [facility];  Service: Urology;  Laterality: Right;  . CYSTOSCOPY WITH RETROGRADE PYELOGRAM, URETEROSCOPY AND STENT PLACEMENT Right 07/05/2013   Procedure: CYSTOSCOPY , URETEROSCOPY ANDstone extraction;  Surgeon: Franchot Gallo, MD;  Location: [facility];  Service: Urology;  Laterality: Right;  . EXPLORATORY LAPARTOMY/  MYOMECTOMY  2005  . LAPAROSCOPIC APPENDECTOMY N/A 07/28/2014   Procedure: APPENDECTOMY LAPAROSCOPIC;  Surgeon: Jackolyn Confer, MD;  Location: [facility];  Service: General;  Laterality: N/A;  . LAPAROSCOPIC GASTRIC BANDING  06-06-2008  . LAPAROSCOPY /  OVARIAN CYSTECTOMY/  LASER ABLATION ENDOMETRIOSIS  2003  . LAPAROTOMY N/A 11/24/2015   Procedure: EXPLORATORY LAPAROTOMY;  Surgeon: Georganna Skeans, MD;  Location: [facility];  Service: General;  Laterality: N/A;  . LYSIS OF ADHESION N/A 11/24/2015   Procedure: LYSIS OF ADHESION;  Surgeon: Georganna Skeans, MD;  Location: [facility];  Service: General;  Laterality: N/A;    Current Outpatient Rx  . Order #: [id number] Class: Normal  . Order #: [id number] Class: Historical Med  . Order #: [id number] Class: Print  . Order #: [id number] Class: Print  . Order #: [id number] Class: Print    Allergies Other and Vicodin [hydrocodone-acetaminophen]  Family History  Problem Relation Age of Onset  . Cancer Maternal Grandmother     Social History Social History  Substance Use Topics  . Smoking  status: Never Smoker  . Smokeless tobacco: Never Used  . Alcohol use 0.0 oz/week     Comment: occasionally    Review of Systems  10-point ROS otherwise negative.  ____________________________________________   PHYSICAL EXAM:  VITAL SIGNS: ED Triage Vitals  Enc Vitals Group     BP 07/14/16 1441 122/99     Pulse Rate 07/14/16 1441 95     Resp 07/14/16 1441 18     Temp 07/14/16 1441 98.2 F (36.8  C)     Temp Source 07/14/16 1441 Oral     SpO2 07/14/16 1441 99 %     Pain Score 07/14/16 1439 10   Constitutional: Alert and oriented. Well appearing and in no acute distress. Eyes: Conjunctivae are normal. Head: Atraumatic. Nose: No congestion/rhinnorhea. Mouth/Throat: Mucous membranes are moist.  Oropharynx non-erythematous. Neck: No stridor.   Cardiovascular: Normal rate, regular rhythm. Good peripheral circulation. Grossly normal heart sounds.   Respiratory: Normal respiratory effort.  No retractions. Lungs CTAB. Gastrointestinal: Soft with focal umbilical and B/L lower abdominal tenderness. No rebound or guarding.  No distention.  Musculoskeletal: No lower extremity tenderness nor edema. No gross deformities of extremities. Neurologic:  Normal speech and language. No gross focal neurologic deficits are appreciated.  Skin:  Skin is warm, dry and intact. No rash noted. Psychiatric: Mood and affect are normal. Speech and behavior are normal.  ____________________________________________   LABS (all labs ordered are listed, but only abnormal results are displayed)  Labs Reviewed  COMPREHENSIVE METABOLIC PANEL - Abnormal; Notable for the following:       Result Value   Potassium 3.0 (*)    ALT 11 (*)    Total Bilirubin <0.1 (*)    All other components within normal limits  CBC - Abnormal; Notable for the following:    Platelets 450 (*)    All other components within normal limits  LIPASE, BLOOD  I-STAT BETA HCG BLOOD, ED ([facility], [facility], [facility] ONLY)   ____________________________________________  RADIOLOGY  Ct Abdomen Pelvis W Contrast  Result Date: 07/14/2016 CLINICAL DATA:  Diffuse abdominal pain for 2 days, extending down the legs. EXAM: CT ABDOMEN AND PELVIS WITH CONTRAST TECHNIQUE: Multidetector CT imaging of the abdomen and pelvis was performed using the standard protocol following bolus administration of intravenous contrast. CONTRAST:  128mL ISOVUE-300 IOPAMIDOL (ISOVUE-300)  INJECTION 61% COMPARISON:  11/21/2015 FINDINGS: Lower chest: No acute abnormality. Hepatobiliary: No focal liver abnormality is seen. No gallstones, gallbladder wall thickening, or biliary dilatation. Pancreas: Unremarkable. No pancreatic ductal dilatation or surrounding inflammatory changes. Spleen: Normal in size without focal abnormality. Adrenals/Urinary Tract: Adrenal glands are unremarkable. Kidneys are normal, without renal calculi, focal lesion, or hydronephrosis. Bladder is unremarkable. Stomach/Bowel: Mild distention of the distal esophagus above the gastric lap band. Stomach is otherwise unremarkable. Small bowel is normal in caliber, without obstruction or inflammation. A knuckle of small bowel extends into the umbilical hernia. Previously, the umbilical hernia contain only fat. The protruding small bowel does not appear to be inflamed and is nonobstructed. There is no inflammatory stranding or fluid within the hernia sac. Colon is unremarkable.  Appendectomy clips noted at the cecal tip. Vascular/Lymphatic: No significant vascular findings are present. No enlarged abdominal or pelvic lymph nodes. Reproductive: 3 cm anterior myometrial mass, presumably a fibroid. No adnexal abnormalities. Other: No focal inflammatory changes. No ascites. No extraluminal gas. Musculoskeletal: No significant skeletal lesion. IMPRESSION: 1. A knuckle of small bowel extends into a small umbilical hernia, without obstruction or significant inflammation. The umbilical  hernia contained only fat on 11/21/2015. 2. No focal inflammatory changes. No ascites, bowel obstruction, or extraluminal gas. 3. Presumed 3 cm anterior fundal fibroid. Electronically Signed   By: Andreas Newport M.D.   On: 07/14/2016 21:35    ____________________________________________   PROCEDURES  Procedure(s) performed:   Procedures  None ____________________________________________   INITIAL IMPRESSION / ASSESSMENT AND PLAN / ED  COURSE  Pertinent labs & imaging results that were available during my care of the patient were reviewed by me and considered in my medical decision making (see chart for details).  Patient presents to the emergency department for evaluation of abdominal discomfort as described above. She has mild to moderate tenderness to palpation in the lower abdomen. CT scan shows small knuckle of bowel in an umbilical hernia. No evidence of obstruction. Patient also has a fibroid on exam. Unclear what is causing her discomfort at this time. She has been treated for fibroids in the past. She will discuss with her OB/GYN regarding the fibroid and I have deferred her to general surgeon to discuss the umbilical hernia. Discussed return precautions in detail. Potassium was 3.0. Plan for 2 days of potassium supplementation and recheck with primary care physician.   At this time, I do not feel there is any life-threatening condition present. I have reviewed and discussed all results (EKG, imaging, lab, urine as appropriate), exam findings with patient. I have reviewed nursing notes and appropriate previous records.  I feel the patient is safe to be discharged home without further emergent workup. Discussed usual and customary return precautions. Patient and family (if present) verbalize understanding and are comfortable with this plan.  Patient will follow-up with their primary care provider. If they do not have a primary care provider, information for follow-up has been provided to them. All questions have been answered.  ____________________________________________  FINAL CLINICAL IMPRESSION(S) / ED DIAGNOSES  Final diagnoses:  Lower abdominal pain  Hypokalemia     MEDICATIONS GIVEN DURING THIS VISIT:  Medications  ketorolac (TORADOL) 30 MG/ML injection 30 mg (30 mg Intravenous Given 07/14/16 1959)  sodium chloride 0.9 % bolus 1,000 mL (0 mLs Intravenous Stopped 07/14/16 2212)  iopamidol (ISOVUE-300) 61 %  injection (100 mLs  Contrast Given 07/14/16 2039)  traMADol (ULTRAM) tablet 50 mg (50 mg Oral Given 07/14/16 2222)     NEW OUTPATIENT MEDICATIONS STARTED DURING THIS VISIT:  Discharge Medication List as of 07/14/2016 10:10 PM    START taking these medications   Details  docusate sodium (COLACE) 100 MG capsule Take 1 capsule (100 mg total) by mouth every 12 (twelve) hours., Starting Sun 07/14/2016, Print    potassium chloride SA (K-DUR,KLOR-CON) 20 MEQ tablet Take 1 tablet (20 mEq total) by mouth 2 (two) times daily., Starting Sun 07/14/2016, Until Tue 07/16/2016, Print    traMADol (ULTRAM) 50 MG tablet Take 1 tablet (50 mg total) by mouth every 6 (six) hours as needed., Starting Sun 07/14/2016, Print        Note:  This document was prepared using Dragon voice recognition software and may include unintentional dictation errors.  Nanda Quinton, MD Emergency Medicine   Margette Fast, MD 07/14/16 [phone]

## 2016-08-30 ENCOUNTER — Emergency Department (HOSPITAL_COMMUNITY): Payer: Self-pay

## 2016-08-30 ENCOUNTER — Encounter (HOSPITAL_COMMUNITY): Payer: Self-pay

## 2016-08-30 ENCOUNTER — Emergency Department (HOSPITAL_COMMUNITY)
Admission: EM | Admit: 2016-08-30 | Discharge: 2016-08-31 | Disposition: A | Discharge status: Home / Self Care | Payer: Self-pay | Attending: Emergency Medicine | Admitting: Emergency Medicine

## 2016-08-30 DIAGNOSIS — J45909 Unspecified asthma, uncomplicated: Secondary | ICD-10-CM | POA: Insufficient documentation

## 2016-08-30 DIAGNOSIS — R1084 Generalized abdominal pain: Secondary | ICD-10-CM | POA: Insufficient documentation

## 2016-08-30 LAB — COMPREHENSIVE METABOLIC PANEL
ALT: 11 U/L — ABNORMAL LOW (ref 14–54)
ANION GAP: 14 (ref 5–15)
AST: 19 U/L (ref 15–41)
Albumin: 4.2 g/dL (ref 3.5–5.0)
Alkaline Phosphatase: 63 U/L (ref 38–126)
BUN: 10 mg/dL (ref 6–20)
CHLORIDE: 104 mmol/L (ref 101–111)
CO2: 22 mmol/L (ref 22–32)
CREATININE: 0.83 mg/dL (ref 0.44–1.00)
Calcium: 9.2 mg/dL (ref 8.9–10.3)
GFR calc Af Amer: >60 mL/min (ref >60)
Glucose, Bld: 110 mg/dL — ABNORMAL HIGH (ref 65–99)
POTASSIUM: 2.5 mmol/L — AB (ref 3.5–5.1)
Sodium: 140 mmol/L (ref 135–145)
Total Bilirubin: 0.9 mg/dL (ref 0.3–1.2)
Total Protein: 7.9 g/dL (ref 6.5–8.1)

## 2016-08-30 LAB — URINALYSIS, ROUTINE W REFLEX MICROSCOPIC
BACTERIA UA: NONE SEEN
BILIRUBIN URINE: NEGATIVE
GLUCOSE, UA: NEGATIVE mg/dL
KETONES UR: 5 mg/dL — AB
LEUKOCYTES UA: NEGATIVE
Nitrite: NEGATIVE
PH: 5 (ref 5.0–8.0)
Protein, ur: NEGATIVE mg/dL
Specific Gravity, Urine: 1.032 — ABNORMAL HIGH (ref 1.005–1.030)

## 2016-08-30 LAB — LIPASE, BLOOD: LIPASE: 29 U/L (ref 11–51)

## 2016-08-30 LAB — CBC WITH DIFFERENTIAL/PLATELET
BASOS ABS: 0 10*3/uL (ref 0.0–0.1)
Basophils Relative: 0 %
EOS PCT: 0 %
Eosinophils Absolute: 0 10*3/uL (ref 0.0–0.7)
HEMATOCRIT: 39 % (ref 36.0–46.0)
Hemoglobin: 12.8 g/dL (ref 12.0–15.0)
LYMPHS PCT: 22 %
Lymphs Abs: 2.7 10*3/uL (ref 0.7–4.0)
MCH: 28.1 pg (ref 26.0–34.0)
MCHC: 32.8 g/dL (ref 30.0–36.0)
MCV: 85.5 fL (ref 78.0–100.0)
MONO ABS: 0.4 10*3/uL (ref 0.1–1.0)
MONOS PCT: 3 %
NEUTROS ABS: 9.5 10*3/uL — AB (ref 1.7–7.7)
Neutrophils Relative %: 75 %
PLATELETS: 413 10*3/uL — AB (ref 150–400)
RBC: 4.56 MIL/uL (ref 3.87–5.11)
RDW: 13.3 % (ref 11.5–15.5)
WBC: 12.5 10*3/uL — AB (ref 4.0–10.5)

## 2016-08-30 LAB — I-STAT CG4 LACTIC ACID, ED
Lactic Acid, Venous: 3.31 mmol/L (ref 0.5–1.9)
Lactic Acid, Venous: 2.22 mmol/L (ref 0.5–1.9)

## 2016-08-30 MED ORDER — PROMETHAZINE HCL 12.5 MG PO TABS: 12.5000 mg | ORAL_TABLET | Freq: Four times a day (QID) | ORAL | 0 refills | Status: DC | PRN

## 2016-08-30 MED ORDER — TRAMADOL HCL 50 MG PO TABS: 50.0000 mg | ORAL_TABLET | Freq: Four times a day (QID) | ORAL | 0 refills | Status: DC | PRN

## 2016-08-30 MED ORDER — MORPHINE SULFATE (PF) 4 MG/ML IV SOLN
4.0000 mg | Freq: Once | INTRAVENOUS | Status: AC
  Administered 2016-08-30: 4 mg via INTRAVENOUS
  Filled 2016-08-30: qty 1

## 2016-08-30 MED ORDER — IOPAMIDOL (ISOVUE-300) INJECTION 61%
INTRAVENOUS | Status: AC
  Administered 2016-08-30: 100 mL
  Filled 2016-08-30: qty 100

## 2016-08-30 MED ORDER — POTASSIUM CHLORIDE CRYS ER 20 MEQ PO TBCR
20.0000 meq | EXTENDED_RELEASE_TABLET | Freq: Two times a day (BID) | ORAL | 0 refills | Status: DC
Start: 2016-08-30 — End: 2017-02-01

## 2016-08-30 MED ORDER — POTASSIUM CHLORIDE CRYS ER 20 MEQ PO TBCR
40.0000 meq | EXTENDED_RELEASE_TABLET | Freq: Once | ORAL | Status: AC
  Administered 2016-08-30: 40 meq via ORAL
  Filled 2016-08-30: qty 2

## 2016-08-30 MED ORDER — SODIUM CHLORIDE 0.9 % IV SOLN: INTRAVENOUS | Status: DC

## 2016-08-30 MED ORDER — SODIUM CHLORIDE 0.9 % IV BOLUS (SEPSIS)
1000.0000 mL | Freq: Once | INTRAVENOUS | Status: AC
  Administered 2016-08-30: 1000 mL via INTRAVENOUS

## 2016-08-30 NOTE — ED Provider Notes (Signed)
[facility] DEPT Provider Note   CSN: [id number] Arrival date & time: 08/30/16  1740     History   Chief Complaint Chief Complaint  Patient presents with  . Abdominal Pain    HPI Meagan Dean is a 46 y.o. female.  The history is provided by the patient, medical records and a friend.  Abdominal Pain   This is a recurrent problem. The current episode started 1 to 2 hours ago (Has been present intermittently for several weeks). The problem has not changed since onset.Associated with: No obvious trigger. The pain is located in the periumbilical region. Quality: unable to specify. The pain is severe. Associated symptoms include nausea and vomiting. Pertinent negatives include fever, dysuria, hematuria and arthralgias. The symptoms are aggravated by palpation. Nothing relieves the symptoms. Past workup includes surgery. Past medical history comments: Multiple abdominal surgeries.    Past Medical History:  Diagnosis Date  . Asthma due to seasonal allergies    MILD--  NO INHALER  . Endometriosis of pelvis   . GERD (gastroesophageal reflux disease)   . H/O hiatal hernia   . History of hypertension    NO ISSUES SINCE WT LOSS AFTER GASTRIC BANDING  . History of obstructive sleep apnea    DX'D PRIOR TO GASTRIC BANDING  2010 --  NO ISSUES SINCE WT LOSS  . Renal calculus, left    NON-OBSTRUCTING  . Right ureteral stone   . Urgency of urination     Patient Active Problem List   Diagnosis Date Noted  . History of obstructive sleep apnea   . Diarrhea 12/15/2015  . History of laparoscopic adjustable gastric banding   . Partial small bowel obstruction ([place]) 11/17/2015  . Screen for STD (sexually transmitted disease) 06/27/2015  . Routine health maintenance 04/03/2015  . Perforated appendicitis s/p lap appy 07/28/2014 07/28/2014  . Hypokalemia 06/17/2013  . Calculus of ureter 06/16/2013    Past Surgical History:  Procedure Laterality Date  . CYSTOSCOPY WITH RETROGRADE PYELOGRAM,  URETEROSCOPY AND STENT PLACEMENT Right 06/18/2013   Procedure: CYSTOSCOPY WITH RETROGRADE PYELOGRAM, URETEROSCOPY AND STENT PLACEMENT;  Surgeon: Franchot Gallo, MD;  Location: [facility];  Service: Urology;  Laterality: Right;  . CYSTOSCOPY WITH RETROGRADE PYELOGRAM, URETEROSCOPY AND STENT PLACEMENT Right 07/05/2013   Procedure: CYSTOSCOPY , URETEROSCOPY ANDstone extraction;  Surgeon: Franchot Gallo, MD;  Location: [facility];  Service: Urology;  Laterality: Right;  . EXPLORATORY LAPARTOMY/  MYOMECTOMY  2005  . LAPAROSCOPIC APPENDECTOMY N/A 07/28/2014   Procedure: APPENDECTOMY LAPAROSCOPIC;  Surgeon: Jackolyn Confer, MD;  Location: [facility];  Service: General;  Laterality: N/A;  . LAPAROSCOPIC GASTRIC BANDING  06-06-2008  . LAPAROSCOPY /  OVARIAN CYSTECTOMY/  LASER ABLATION ENDOMETRIOSIS  2003  . LAPAROTOMY N/A 11/24/2015   Procedure: EXPLORATORY LAPAROTOMY;  Surgeon: Georganna Skeans, MD;  Location: [facility];  Service: General;  Laterality: N/A;  . LYSIS OF ADHESION N/A 11/24/2015   Procedure: LYSIS OF ADHESION;  Surgeon: Georganna Skeans, MD;  Location: [facility];  Service: General;  Laterality: N/A;    OB History    No data available       Home Medications    Prior to Admission medications   Medication Sig Start Date End Date Taking? Authorizing Provider  docusate sodium (COLACE) 100 MG capsule Take 1 capsule (100 mg total) by mouth every 12 (twelve) hours. 07/14/16   Margette Fast, MD  potassium chloride SA (K-DUR,KLOR-CON) 20 MEQ tablet Take 1 tablet (20 mEq total) by mouth 2 (two) times  daily. 07/14/16 07/16/16  Margette Fast, MD  promethazine (PHENERGAN) 12.5 MG tablet Take 1 tablet (12.5 mg total) by mouth every 6 (six) hours as needed for nausea or vomiting. 11/30/15   Rolm Bookbinder, MD  pseudoephedrine-acetaminophen (TYLENOL SINUS) 30-500 MG TABS tablet Take 1 tablet by mouth every 4 (four) hours as needed (congestion).    Historical Provider, MD  traMADol (ULTRAM) 50 MG tablet  Take 1 tablet (50 mg total) by mouth every 6 (six) hours as needed. 07/14/16   Margette Fast, MD    Family History Family History  Problem Relation Age of Onset  . Cancer Maternal Grandmother     Social History Social History  Substance Use Topics  . Smoking status: Never Smoker  . Smokeless tobacco: Never Used  . Alcohol use 0.0 oz/week     Comment: occasionally     Allergies   Other and Vicodin [hydrocodone-acetaminophen]   Review of Systems Review of Systems  Constitutional: Negative for chills and fever.  HENT: Negative for ear pain and sore throat.   Eyes: Negative for pain and visual disturbance.  Respiratory: Negative for cough and shortness of breath.   Cardiovascular: Negative for chest pain and palpitations.  Gastrointestinal: Positive for abdominal pain, nausea and vomiting.  Genitourinary: Negative for dysuria and hematuria.  Musculoskeletal: Negative for arthralgias and back pain.  Skin: Negative for color change and rash.  Neurological: Negative for seizures and syncope.  All other systems reviewed and are negative.    Physical Exam Updated Vital Signs LMP 07/28/2016 (Approximate)   Physical Exam  Constitutional: She is oriented to person, place, and time. She appears well-developed and well-nourished. No distress.  HENT:  Head: Normocephalic and atraumatic.  Eyes: Conjunctivae are normal.  Neck: Neck supple.  Cardiovascular: Normal rate and regular rhythm.   No murmur heard. Pulmonary/Chest: Effort normal and breath sounds normal. No respiratory distress.  Abdominal: Soft. There is tenderness.  Exquisite diffuse TTP, palpating anywhere causes severe pain left of the umbilicus, scars from multiple abdominal surgeries  Musculoskeletal: She exhibits no edema.  Neurological: She is alert and oriented to person, place, and time.  Skin: Skin is warm and dry.  Psychiatric: She has a normal mood and affect.  Nursing note and vitals reviewed.    ED  Treatments / Results  Labs (all labs ordered are listed, but only abnormal results are displayed) Labs Reviewed  COMPREHENSIVE METABOLIC PANEL - Abnormal; Notable for the following:       Result Value   Potassium 2.5 (*)    Glucose, Bld 110 (*)    ALT 11 (*)    All other components within normal limits  CBC WITH DIFFERENTIAL/PLATELET - Abnormal; Notable for the following:    WBC 12.5 (*)    Platelets 413 (*)    Neutro Abs 9.5 (*)    All other components within normal limits  URINALYSIS, ROUTINE W REFLEX MICROSCOPIC - Abnormal; Notable for the following:    APPearance HAZY (*)    Specific Gravity, Urine 1.032 (*)    Hgb urine dipstick LARGE (*)    Ketones, ur 5 (*)    Squamous Epithelial / LPF 0-5 (*)    All other components within normal limits  I-STAT CG4 LACTIC ACID, ED - Abnormal; Notable for the following:    Lactic Acid, Venous 3.31 (*)    All other components within normal limits  I-STAT CG4 LACTIC ACID, ED - Abnormal; Notable for the following:    Lactic Acid,  Venous 2.22 (*)    All other components within normal limits  LIPASE, BLOOD    EKG  EKG Interpretation None       Radiology No results found.  Procedures Procedures (including critical care time)  Medications Ordered in ED Medications - No data to display   Initial Impression / Assessment and Plan / ED Course  I have reviewed the triage vital signs and the nursing notes.  Pertinent labs & imaging results that were available during my care of the patient were reviewed by me and considered in my medical decision making (see chart for details).    Pt with h/o multiple abdominal surgeries presents with abdominal pain and nausea. Says she has been having bouts of severe abdominal pain for the last several weeks, but this afternoon she had one that was worse than the others & was associated with nausea & vomiting of "froth". She has had an SBO in the past, and says this feels similar. Not currently  nauseated, but complains of abdominal pain. Denies F/C, HA, lightheadedness, cough, CP, SOB, constipation/diarrhea, urinary symptoms, or pelvic pain. Last BM yesterday or the day before & was normal.  VS & exam as above. IV pain medication & NS bolus given. Labs remarkable for LA 3.31, K 2.5, WBC 12.5; potassium replenished orally. CT A&P without explanation for the patient's symptoms. Cause of the patient's symptoms unclear, but doubt emergent etiology given history, exam, workup.  Explained all results to the Pt. Will discharge the Pt home with prescriptions for some of her home medications. Recommending follow-up with PCP. ED return precautions provided. Pt acknowledged understanding of, and concurrence with the plan. All questions answered to her satisfaction. In stable condition at the time of discharge.  Final Clinical Impressions(s) / ED Diagnoses   Final diagnoses:  Generalized abdominal pain    New Prescriptions New Prescriptions   No medications on file     Jenny Reichmann, MD 08/30/16 [id number]    Elnora Morrison, MD 08/31/16 [phone]

## 2016-08-30 NOTE — ED Notes (Signed)
Patient transported to CT 

## 2016-08-30 NOTE — ED Notes (Signed)
Dr Reather Converse given a copy of lactic acid results 2.22

## 2016-08-30 NOTE — ED Triage Notes (Signed)
Per EMS: Pt complaining of abdominal pain. Pt states hx of bowel obstruction, states feels. Pt states LBM yesterday. Pt complaining of n/v. Pt states 1 episode of emesis today.

## 2016-09-11 ENCOUNTER — Telehealth: Payer: Self-pay | Admitting: Internal Medicine

## 2016-09-11 NOTE — Telephone Encounter (Signed)
APT. REMINDER CALL, NO ANSWER, NO VOICEMAIL °

## 2016-09-12 ENCOUNTER — Encounter: Payer: Self-pay | Admitting: Internal Medicine

## 2017-01-24 ENCOUNTER — Ambulatory Visit: Payer: Self-pay

## 2017-01-31 ENCOUNTER — Encounter (INDEPENDENT_AMBULATORY_CARE_PROVIDER_SITE_OTHER): Payer: Self-pay

## 2017-01-31 ENCOUNTER — Ambulatory Visit (INDEPENDENT_AMBULATORY_CARE_PROVIDER_SITE_OTHER): Payer: Self-pay | Admitting: Internal Medicine

## 2017-01-31 VITALS — BP 132/93 | HR 89 | Temp 98.1°F | Ht 62.0 in | Wt 163.3 lb

## 2017-01-31 DIAGNOSIS — Z8742 Personal history of other diseases of the female genital tract: Secondary | ICD-10-CM

## 2017-01-31 DIAGNOSIS — D259 Leiomyoma of uterus, unspecified: Secondary | ICD-10-CM

## 2017-01-31 DIAGNOSIS — R350 Frequency of micturition: Secondary | ICD-10-CM

## 2017-01-31 DIAGNOSIS — Z87442 Personal history of urinary calculi: Secondary | ICD-10-CM

## 2017-01-31 DIAGNOSIS — R3915 Urgency of urination: Secondary | ICD-10-CM | POA: Insufficient documentation

## 2017-01-31 DIAGNOSIS — R1030 Lower abdominal pain, unspecified: Secondary | ICD-10-CM

## 2017-01-31 DIAGNOSIS — R3129 Other microscopic hematuria: Secondary | ICD-10-CM

## 2017-01-31 DIAGNOSIS — R300 Dysuria: Secondary | ICD-10-CM

## 2017-01-31 DIAGNOSIS — M545 Low back pain: Secondary | ICD-10-CM

## 2017-01-31 NOTE — Progress Notes (Signed)
   CC: inc urinary urgency and frequency  HPI:  [name] is a 46 y.o. with a PMH of gastric banding surgery, SBO, uterine fibroids presenting to clinic for inc urinary urgency and frequency.  Patient presents with weeks long history of increased urinary frequency, urgency, minimal dysuria, suprapubic pressure and low back pressure with radiates bilaterally to her suprapubic region. She denies discharge, vaginal bleeding, genital lesions, hematuria, feeling of fullness or pressure, organ prolapse, fevers, chills, nausea, vomiting, diarrhea. Patient denies concern for STDs. She states that her symptoms are similar to her previous symptomatic fibroid in combination with previous nephrolithiasis. She has had a myomectomy in 2003 and ureteral stent placement and removal in 2015. Most recent imaging from April 2018 shows no urinary tract abnormalities, and did show multiple uterine fibroids.   Please see problem based Assessment and Plan for status of patients chronic conditions.  Past Medical History:  Diagnosis Date  . Asthma due to seasonal allergies    MILD--  NO INHALER  . Endometriosis of pelvis   . GERD (gastroesophageal reflux disease)   . H/O hiatal hernia   . History of hypertension    NO ISSUES SINCE WT LOSS AFTER GASTRIC BANDING  . History of obstructive sleep apnea    DX'D PRIOR TO GASTRIC BANDING  2010 --  NO ISSUES SINCE WT LOSS  . Renal calculus, left    NON-OBSTRUCTING  . Right ureteral stone   . Urgency of urination     Review of Systems:   ROS Per ROS  Physical Exam:  Vitals:   01/31/17 1442  BP: (!) 132/93  Pulse: 89  Temp: 98.1 F (36.7 C)  TempSrc: Oral  SpO2: 99%  Weight: 163 lb 4.8 oz (74.1 kg)  Height: 5\' 2"  (1.575 m)   GENERAL- alert, co-operative, appears as stated age, not in any distress. HEENT- Atraumatic, normocephalic CARDIAC- RRR, no murmurs, rubs or gallops. RESP- Moving equal volumes of air, and clear to auscultation bilaterally, no  wheezes or crackles. ABDOMEN- Soft, bowel sounds present, nondistended, mild tenderness to palpation of lower abdomen and suprapubic fullness. GU: no lesions; scant white discharge at cervical os and vulva, cervix displaced laterally to right, pelvic exam reveals gen tenderness and suprapubic fullness. No inguinal lymphadenopathy appreciated. No CVA tenderness. NEURO- No obvious Cr N abnormality. EXTREMITIES- pulse 2+, symmetric, no pedal edema. SKIN- Warm, dry, no rash or lesion. PSYCH- Normal mood and affect, appropriate thought content and speech.  Assessment & Plan:   See Encounters Tab for problem based charting.   Patient seen with Dr. Nilsa Nutting, MD Internal Medicine PGY2

## 2017-01-31 NOTE — Patient Instructions (Signed)
I will call you when I get the results of your test.  For your pain and pressure, use hot water bottle and tylenol (can use 500mg  every 6 hours).

## 2017-02-01 DIAGNOSIS — R3129 Other microscopic hematuria: Secondary | ICD-10-CM

## 2017-02-01 DIAGNOSIS — R319 Hematuria, unspecified: Secondary | ICD-10-CM | POA: Insufficient documentation

## 2017-02-01 NOTE — Assessment & Plan Note (Signed)
Patient with microscopic hematuria today and 08/2016 (had previous as well but those may have been in association with UTI). She has a history of nephrolithiasis in 2015 with ureteral stent placement and removal. Cystoscopy at that time was unremarkable. CT abd in April 2018 did not show any renal abnormalities. Patient endorses remote history of occasional cigarette use, otherwise denies chemical exposures. She denies a family history of cancer.  Patient currently uninsured due to cost and likely not candidate for orange card/medicaid as she is employed full time. We discussed meeting with our financial counselor for other options for her. I advised patient to f/u urology for evaluation of microscopic hematuria as it could be related to further nephrolithiasis, renal or bladder tumors.

## 2017-02-01 NOTE — Assessment & Plan Note (Signed)
Patient presents with weeks long history of increased urinary frequency, urgency, minimal dysuria, suprapubic pressure and low back pressure with radiates bilaterally to her suprapubic region. She denies discharge, vaginal bleeding, genital lesions, hematuria, feeling of fullness or pressure, organ prolapse, fevers, chills, nausea, vomiting, diarrhea. Patient denies concern for STDs. She states that her symptoms are similar to her previous symptomatic fibroid in combination with previous nephrolithiasis. She has had a myomectomy in 2003 and ureteral stent placement and removal in 2015. Most recent imaging from April 2018 shows no urinary tract abnormalities, and did show multiple uterine fibroids.  Exam reveals scant vaginal discharge, laterally displaced cervix, and generalized lower abd tenderness.  Dipstick urine negative for nitrites or leukocytes; positive for large blood.  Patient's symptoms not due to UTI based on history and urine studies; also not consistent with nephrolithiasis; could be due to fibroids or infection.   Plan: --f/u wet prep --patient will likely need transvaginal ultrasound in the future esp if her wet prep is negative

## 2017-02-03 ENCOUNTER — Telehealth: Payer: Self-pay

## 2017-02-03 LAB — POCT URINALYSIS DIPSTICK
Glucose, UA: NEGATIVE
LEUKOCYTES UA: NEGATIVE
Nitrite, UA: NEGATIVE
PH UA: 6.5 (ref 5.0–8.0)
PROTEIN UA: 100
Urobilinogen, UA: 2 E.U./dL — AB

## 2017-02-03 LAB — CERVICOVAGINAL ANCILLARY ONLY
Bacterial vaginitis: POSITIVE — AB
CHLAMYDIA, DNA PROBE: NEGATIVE
Candida vaginitis: NEGATIVE
Neisseria Gonorrhea: NEGATIVE
Trichomonas: NEGATIVE

## 2017-02-03 NOTE — Progress Notes (Signed)
Internal Medicine Clinic Attending  Case discussed with Dr. Svalina  at the time of the visit.  We reviewed the resident's history and exam and pertinent patient test results.  I agree with the assessment, diagnosis, and plan of care documented in the resident's note.  

## 2017-02-03 NOTE — Telephone Encounter (Signed)
Want med for UTI. Please call pt back.

## 2017-02-03 NOTE — Telephone Encounter (Signed)
rtc to pt, offered new appt, refused at this time. She will call back if she changes her mind

## 2017-02-04 ENCOUNTER — Telehealth: Payer: Self-pay | Admitting: Internal Medicine

## 2017-02-04 NOTE — Telephone Encounter (Signed)
Attempted x2 to call patient about wet prep results indicating bacterial vaginitis, however I was unable to reach her. I left a message to call the clinic back.  Will send in metronidazole 500mg  BID x 7 days.  Alphonzo Grieve, MD IMTS - PGY2 Pager [phone]

## 2017-02-05 MED ORDER — METRONIDAZOLE 500 MG PO TABS: 500.0000 mg | ORAL_TABLET | Freq: Two times a day (BID) | ORAL | 0 refills | Status: AC

## 2017-02-05 NOTE — Telephone Encounter (Signed)
Pt returning call from MD-pt was informed that she has a bacteria infection and MD will be sending in rx for metronidazole 500mg  BID x 7 days.  Pt verbalized understanding and will pick up rx later today.Regenia Skeeter, Darlene [name]9/26/20189:12 AM

## 2017-02-24 ENCOUNTER — Telehealth: Payer: Self-pay | Admitting: *Deleted

## 2017-02-24 NOTE — Telephone Encounter (Signed)
Pt states she has a "yeast infection". Symptoms (itching/cottage cheese like discharge)  started after taking one week supply of flagyl that was rx'd on 02/05/2017.  Will send diflucan request to pcp and pt will keep appt on 10/26 as scheduled.  Please advise.Despina Hidden [name]10/15/20189:41 AM

## 2017-02-27 MED ORDER — FLUCONAZOLE 150 MG PO TABS: 150.0000 mg | ORAL_TABLET | Freq: Once | ORAL | 0 refills | Status: AC

## 2017-02-27 NOTE — Telephone Encounter (Signed)
Pt aware.Meagan Hidden [name]10/18/201810:34 AM

## 2017-02-27 NOTE — Telephone Encounter (Signed)
I will send her a one time dose of fluconazole.  If not improved within 72 hours of that medication, she will need to come and be seen in clinic for further evaluation.

## 2017-02-27 NOTE — Telephone Encounter (Signed)
Pt calling back-symptoms "getting worse"-would like to know what to do.  Will send to attending for review, please advise.Despina Hidden [name]10/18/201810:12 AM

## 2017-03-07 ENCOUNTER — Ambulatory Visit (INDEPENDENT_AMBULATORY_CARE_PROVIDER_SITE_OTHER): Payer: Self-pay | Admitting: Internal Medicine

## 2017-03-07 ENCOUNTER — Encounter (INDEPENDENT_AMBULATORY_CARE_PROVIDER_SITE_OTHER): Payer: Self-pay

## 2017-03-07 ENCOUNTER — Ambulatory Visit: Payer: Self-pay

## 2017-03-07 VITALS — BP 127/82 | HR 68 | Temp 98.1°F | Wt 165.4 lb

## 2017-03-07 DIAGNOSIS — Z87891 Personal history of nicotine dependence: Secondary | ICD-10-CM

## 2017-03-07 DIAGNOSIS — E876 Hypokalemia: Secondary | ICD-10-CM

## 2017-03-07 DIAGNOSIS — R3129 Other microscopic hematuria: Secondary | ICD-10-CM

## 2017-03-07 DIAGNOSIS — Z87448 Personal history of other diseases of urinary system: Secondary | ICD-10-CM

## 2017-03-07 DIAGNOSIS — Z9689 Presence of other specified functional implants: Secondary | ICD-10-CM

## 2017-03-07 DIAGNOSIS — Z124 Encounter for screening for malignant neoplasm of cervix: Secondary | ICD-10-CM

## 2017-03-07 DIAGNOSIS — R87810 Cervical high risk human papillomavirus (HPV) DNA test positive: Secondary | ICD-10-CM

## 2017-03-07 DIAGNOSIS — N912 Amenorrhea, unspecified: Secondary | ICD-10-CM

## 2017-03-07 DIAGNOSIS — R87610 Atypical squamous cells of undetermined significance on cytologic smear of cervix (ASC-US): Secondary | ICD-10-CM

## 2017-03-07 DIAGNOSIS — Z0000 Encounter for general adult medical examination without abnormal findings: Secondary | ICD-10-CM

## 2017-03-07 DIAGNOSIS — R109 Unspecified abdominal pain: Secondary | ICD-10-CM

## 2017-03-07 DIAGNOSIS — Z8742 Personal history of other diseases of the female genital tract: Secondary | ICD-10-CM

## 2017-03-07 DIAGNOSIS — N898 Other specified noninflammatory disorders of vagina: Secondary | ICD-10-CM | POA: Insufficient documentation

## 2017-03-07 LAB — POCT URINE PREGNANCY: Preg Test, Ur: NEGATIVE

## 2017-03-07 NOTE — Progress Notes (Signed)
   CC: Vaginal discharge  HPI:  Meagan Dean is a 46 y.o. female with past medical history outlined below here for vaginal discharge. For the details of today's visit, please refer to the assessment and plan.  Past Medical History:  Diagnosis Date  . Asthma due to seasonal allergies    MILD--  NO INHALER  . Endometriosis of pelvis   . GERD (gastroesophageal reflux disease)   . H/O hiatal hernia   . History of hypertension    NO ISSUES SINCE WT LOSS AFTER GASTRIC BANDING  . History of obstructive sleep apnea    DX'D PRIOR TO GASTRIC BANDING  2010 --  NO ISSUES SINCE WT LOSS  . Renal calculus, left    NON-OBSTRUCTING  . Right ureteral stone   . Urgency of urination     Review of Systems  Constitutional: Negative for fever.  Genitourinary: Positive for flank pain.    Physical Exam:  Vitals:   03/07/17 1549  BP: 127/82  Pulse: 68  Temp: 98.1 F (36.7 C)  SpO2: 100%  Weight: 165 lb 6.4 oz (75 kg)    Constitutional: NAD, appears comfortable Abdominal: Soft, non tender, non distended. +BS. Mild left CVA tenderness.  GU: Normal external genitalia. Diffuse white discharge in the vaginal vault. Cervix identified by visualization obscured by diffuse discharge.  Neurological: A&Ox3, CN II - XII grossly intact.  Skin: No rashes or erythema  Psychiatric: Normal mood and affect  Assessment & Plan:   See Encounters Tab for problem based charting.  Patient discussed with Dr. Lynnae January

## 2017-03-07 NOTE — Assessment & Plan Note (Addendum)
Patient has persistent microscopic hematuria today on point-of-care urinalysis. She is complaining of left flank pain and abdominal pain. She has a significant history of nephrolithiasis in 2015 with hydronephrosis requiring hospitalization, stent placement, lithotripsy. Cystoscopy at that time was unremarkable. She has not followed up with urology due to being uninsured. I'm concerned for recurrent nephrolithiasis given her ongoing hematuria and flank pain. She is agreeable to pay out of pocket for CT renal stone study for further evaluation. If negative, will need referral to urology for follow-up. She endorses a remote history of occasional cigarette use, otherwise denied chemical exposures. She has no family history of cancer. -- F/u CT renal stone study  -- F/u complete urinalysis  -- F/u BMP -- Urology referral pending CT results   ADDENDUM: BMP with normal renal function, potassium 2.7. Sent prescription for supplements to pharmacy. CT pending.

## 2017-03-07 NOTE — Assessment & Plan Note (Addendum)
F/u PAP smear.   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 Called patient with results. Placed referral to gynecology.

## 2017-03-07 NOTE — Assessment & Plan Note (Addendum)
Hx of amenorrhea since March 2018. Sexually active but POC urine pregnancy test today is negative. Possibly perimenopausal, will defer work up for now.  -- F/u PCP

## 2017-03-07 NOTE — Assessment & Plan Note (Addendum)
Patient is here today with complaint of vaginal discharge. She was treated 1 month ago for bacterial vaginosis with metronidazole. She subsequently developed symptoms of what she thought was a yeast infection, and was given a prescription for fluconazole 1. Despite this, she's had persistent suprapubic pain, and diffuse vaginal discharge. She is sexually active with one partner, does not use protection. Repeat pelvic exam today shows diffuse white discharge in the vaginal vault, sent for testing. -- F/u wet prep, GC/chlamydia, trichomonas   ADDENDUM: Wet prep, GC/chlamydia, and trichomonas all negative. Called patient with results. Likely normal psychogogic discharge.

## 2017-03-07 NOTE — Patient Instructions (Signed)
Meagan Dean,  It was a pleasure to meet you today. I will call you with the results of your testing & blood work tomorrow. I have ordered a CT to evaluate for kidney stones, you will be called next week to have this scheduled. Schedule a follow up with [place] in 2 weeks, you may cancel if you are feeling better. If you have any questions or concerns, call our clinic at [phone] or after hours call [phone] and ask for the internal medicine resident on call. Thank you!  - Dr. Philipp Ovens

## 2017-03-08 LAB — URINALYSIS, ROUTINE W REFLEX MICROSCOPIC
BILIRUBIN UA: NEGATIVE
GLUCOSE, UA: NEGATIVE
LEUKOCYTES UA: NEGATIVE
Nitrite, UA: NEGATIVE
Specific Gravity, UA: >=1.03 — AB (ref 1.005–1.030)
Urobilinogen, Ur: 1 mg/dL (ref 0.2–1.0)
pH, UA: 5.5 (ref 5.0–7.5)

## 2017-03-08 LAB — MICROSCOPIC EXAMINATION: Casts: NONE SEEN /lpf

## 2017-03-08 LAB — BMP8+ANION GAP
Anion Gap: 18 mmol/L (ref 10.0–18.0)
BUN/Creatinine Ratio: 16 (ref 9–23)
BUN: 12 mg/dL (ref 6–24)
CO2: 25 mmol/L (ref 20–29)
Calcium: 9.6 mg/dL (ref 8.7–10.2)
Chloride: 102 mmol/L (ref 96–106)
Creatinine, Ser: 0.76 mg/dL (ref 0.57–1.00)
GFR, EST AFRICAN AMERICAN: 110 mL/min/{1.73_m2} (ref >59)
GFR, EST NON AFRICAN AMERICAN: 95 mL/min/{1.73_m2} (ref >59)
Glucose: 81 mg/dL (ref 65–99)
Potassium: 2.7 mmol/L — ABNORMAL LOW (ref 3.5–5.2)
Sodium: 145 mmol/L — ABNORMAL HIGH (ref 134–144)

## 2017-03-10 LAB — CERVICOVAGINAL ANCILLARY ONLY
BACTERIAL VAGINITIS: NEGATIVE
CANDIDA VAGINITIS: NEGATIVE
Chlamydia: NEGATIVE
NEISSERIA GONORRHEA: NEGATIVE
TRICH (WINDOWPATH): NEGATIVE

## 2017-03-11 MED ORDER — POTASSIUM CHLORIDE ER 20 MEQ PO TBCR: 40.0000 meq | EXTENDED_RELEASE_TABLET | Freq: Every day | ORAL | 0 refills | Status: DC

## 2017-03-11 NOTE — Addendum Note (Signed)
Addended by: Jodean Lima on: 03/11/2017 08:44 AM   Modules accepted: Orders

## 2017-03-11 NOTE — Assessment & Plan Note (Signed)
ADDENDUM: BMP with potassium 2.7. Called patient with results. Sent prescription for potassium 40 mEq daily x 5 days. F/u scheduled on 11/9, recommend repeat BMP.

## 2017-03-12 NOTE — Progress Notes (Signed)
Internal Medicine Clinic Attending  Case discussed with Dr. Guilloud at the time of the visit.  We reviewed the resident's history and exam and pertinent patient test results.  I agree with the assessment, diagnosis, and plan of care documented in the resident's note.  

## 2017-03-14 LAB — CYTOLOGY - PAP
DIAGNOSIS: UNDETERMINED — AB
HPV (WINDOPATH): DETECTED — AB
HPV 16/18/45 GENOTYPING: NEGATIVE

## 2017-03-15 DIAGNOSIS — R87610 Atypical squamous cells of undetermined significance on cytologic smear of cervix (ASC-US): Secondary | ICD-10-CM | POA: Insufficient documentation

## 2017-03-15 DIAGNOSIS — R87810 Cervical high risk human papillomavirus (HPV) DNA test positive: Secondary | ICD-10-CM

## 2017-03-15 HISTORY — DX: Cervical high risk human papillomavirus (HPV) DNA test positive: R87.810

## 2017-03-15 HISTORY — DX: Atypical squamous cells of undetermined significance on cytologic smear of cervix (ASC-US): R87.610

## 2017-03-15 NOTE — Addendum Note (Signed)
Addended by: Jodean Lima on: 03/15/2017 01:54 PM   Modules accepted: Orders

## 2017-03-18 ENCOUNTER — Ambulatory Visit (HOSPITAL_COMMUNITY): Payer: Self-pay

## 2017-03-21 ENCOUNTER — Inpatient Hospital Stay (HOSPITAL_COMMUNITY)
Admission: AD | Admit: 2017-03-21 | Discharge: 2017-03-23 | DRG: 641 | Disposition: A | Discharge status: Home / Self Care | Payer: Self-pay | Source: Ambulatory Visit | Attending: Internal Medicine | Admitting: Internal Medicine

## 2017-03-21 ENCOUNTER — Other Ambulatory Visit: Payer: Self-pay

## 2017-03-21 ENCOUNTER — Other Ambulatory Visit: Payer: Self-pay | Admitting: Oncology

## 2017-03-21 ENCOUNTER — Ambulatory Visit (HOSPITAL_COMMUNITY)
Admission: RE | Admit: 2017-03-21 | Discharge: 2017-03-21 | Disposition: A | Discharge status: Home / Self Care | Payer: Self-pay | Source: Ambulatory Visit | Attending: Internal Medicine | Admitting: Internal Medicine

## 2017-03-21 ENCOUNTER — Encounter: Payer: Self-pay | Admitting: Internal Medicine

## 2017-03-21 ENCOUNTER — Ambulatory Visit (INDEPENDENT_AMBULATORY_CARE_PROVIDER_SITE_OTHER): Payer: Self-pay | Admitting: Internal Medicine

## 2017-03-21 VITALS — BP 143/90 | HR 70 | Temp 97.9°F | Wt 164.5 lb

## 2017-03-21 DIAGNOSIS — K219 Gastro-esophageal reflux disease without esophagitis: Secondary | ICD-10-CM | POA: Diagnosis present

## 2017-03-21 DIAGNOSIS — R87810 Cervical high risk human papillomavirus (HPV) DNA test positive: Secondary | ICD-10-CM

## 2017-03-21 DIAGNOSIS — R87610 Atypical squamous cells of undetermined significance on cytologic smear of cervix (ASC-US): Secondary | ICD-10-CM

## 2017-03-21 DIAGNOSIS — R112 Nausea with vomiting, unspecified: Secondary | ICD-10-CM | POA: Diagnosis present

## 2017-03-21 DIAGNOSIS — Z79899 Other long term (current) drug therapy: Secondary | ICD-10-CM

## 2017-03-21 DIAGNOSIS — E876 Hypokalemia: Principal | ICD-10-CM | POA: Diagnosis present

## 2017-03-21 DIAGNOSIS — R350 Frequency of micturition: Secondary | ICD-10-CM | POA: Diagnosis not present

## 2017-03-21 DIAGNOSIS — N809 Endometriosis, unspecified: Secondary | ICD-10-CM | POA: Diagnosis present

## 2017-03-21 DIAGNOSIS — R1030 Lower abdominal pain, unspecified: Secondary | ICD-10-CM | POA: Diagnosis present

## 2017-03-21 DIAGNOSIS — D259 Leiomyoma of uterus, unspecified: Secondary | ICD-10-CM | POA: Diagnosis present

## 2017-03-21 DIAGNOSIS — R3129 Other microscopic hematuria: Secondary | ICD-10-CM

## 2017-03-21 DIAGNOSIS — R109 Unspecified abdominal pain: Secondary | ICD-10-CM

## 2017-03-21 DIAGNOSIS — I10 Essential (primary) hypertension: Secondary | ICD-10-CM | POA: Diagnosis present

## 2017-03-21 DIAGNOSIS — R200 Anesthesia of skin: Secondary | ICD-10-CM | POA: Diagnosis present

## 2017-03-21 DIAGNOSIS — N912 Amenorrhea, unspecified: Secondary | ICD-10-CM

## 2017-03-21 DIAGNOSIS — R300 Dysuria: Secondary | ICD-10-CM | POA: Diagnosis not present

## 2017-03-21 DIAGNOSIS — Z885 Allergy status to narcotic agent status: Secondary | ICD-10-CM

## 2017-03-21 DIAGNOSIS — Z9884 Bariatric surgery status: Secondary | ICD-10-CM

## 2017-03-21 LAB — BASIC METABOLIC PANEL
Anion gap: 7 (ref 5–15)
BUN: 10 mg/dL (ref 6–20)
CHLORIDE: 104 mmol/L (ref 101–111)
CO2: 30 mmol/L (ref 22–32)
CREATININE: 0.8 mg/dL (ref 0.44–1.00)
Calcium: 9.2 mg/dL (ref 8.9–10.3)
Glucose, Bld: 78 mg/dL (ref 65–99)
POTASSIUM: 2.4 mmol/L — AB (ref 3.5–5.1)
SODIUM: 141 mmol/L (ref 135–145)

## 2017-03-21 LAB — CBC
HCT: 33.5 % — ABNORMAL LOW (ref 36.0–46.0)
HEMOGLOBIN: 10.9 g/dL — AB (ref 12.0–15.0)
MCH: 27.3 pg (ref 26.0–34.0)
MCHC: 32.5 g/dL (ref 30.0–36.0)
MCV: 84 fL (ref 78.0–100.0)
Platelets: 361 10*3/uL (ref 150–400)
RBC: 3.99 MIL/uL (ref 3.87–5.11)
RDW: 14.5 % (ref 11.5–15.5)
WBC: 7.9 10*3/uL (ref 4.0–10.5)

## 2017-03-21 LAB — URINALYSIS, ROUTINE W REFLEX MICROSCOPIC
Bilirubin Urine: NEGATIVE
GLUCOSE, UA: NEGATIVE mg/dL
Hgb urine dipstick: NEGATIVE
KETONES UR: 5 mg/dL — AB
LEUKOCYTES UA: NEGATIVE
NITRITE: NEGATIVE
Protein, ur: 30 mg/dL — AB
Specific Gravity, Urine: 1.029 (ref 1.005–1.030)
pH: 5 (ref 5.0–8.0)

## 2017-03-21 LAB — CK: Total CK: 72 U/L (ref 38–234)

## 2017-03-21 LAB — MAGNESIUM: Magnesium: 2.1 mg/dL (ref 1.7–2.4)

## 2017-03-21 LAB — TSH: TSH: 1.539 u[IU]/mL (ref 0.350–4.500)

## 2017-03-21 MED ORDER — ENOXAPARIN SODIUM 40 MG/0.4ML ~~LOC~~ SOLN
40.0000 mg | SUBCUTANEOUS | Status: DC
  Administered 2017-03-21 – 2017-03-22 (×2): 40 mg via SUBCUTANEOUS
  Filled 2017-03-21 (×2): qty 0.4

## 2017-03-21 MED ORDER — POTASSIUM CHLORIDE 10 MEQ/100ML IV SOLN
10.0000 meq | INTRAVENOUS | Status: AC
  Administered 2017-03-21 – 2017-03-22 (×6): 10 meq via INTRAVENOUS
  Filled 2017-03-21 (×6): qty 100

## 2017-03-21 MED ORDER — ACETAMINOPHEN 325 MG PO TABS: 650.0000 mg | ORAL_TABLET | Freq: Four times a day (QID) | ORAL | Status: DC | PRN

## 2017-03-21 MED ORDER — ACETAMINOPHEN 650 MG RE SUPP: 650.0000 mg | Freq: Four times a day (QID) | RECTAL | Status: DC | PRN

## 2017-03-21 MED ORDER — ONDANSETRON HCL 4 MG/2ML IJ SOLN
4.0000 mg | Freq: Three times a day (TID) | INTRAMUSCULAR | Status: DC | PRN
  Administered 2017-03-21: 4 mg via INTRAVENOUS
  Filled 2017-03-21: qty 2

## 2017-03-21 MED ORDER — POTASSIUM CHLORIDE CRYS ER 20 MEQ PO TBCR
40.0000 meq | EXTENDED_RELEASE_TABLET | Freq: Two times a day (BID) | ORAL | Status: DC
  Administered 2017-03-21 – 2017-03-23 (×4): 40 meq via ORAL
  Filled 2017-03-21 (×4): qty 2

## 2017-03-21 MED ORDER — HYDROMORPHONE HCL 1 MG/ML IJ SOLN
0.5000 mg | INTRAMUSCULAR | Status: DC | PRN
  Administered 2017-03-21 – 2017-03-22 (×4): 0.5 mg via INTRAVENOUS
  Filled 2017-03-21 (×4): qty 1

## 2017-03-21 NOTE — Assessment & Plan Note (Signed)
Assessment: Patient presents with progressively worsening abdominal pain for greater than 4 weeks.  Patient recently seen in our clinic at which time multiple testing modalities were performed with CT with renal stone protocol ordered.  CT is scheduled for 4:15 PM today. Patient is most likely suffering from endometriosis versus abdominal adhesion versus less likely renal stone given her presentation and history and lack of CVA tenderness as well as the radiation of the pain into her groin and down her legs.  Plan: Follow-up on CT results. Again attempting to schedule with OB/GYN concerning the risk for endometriosis based etiology Advised patient consider ibuprofen 600 mg 3 times daily scheduled, not to wait until the pain is severe before taking a single dose of 800 mg.

## 2017-03-21 NOTE — Progress Notes (Signed)
   CC: abdominal pain  HPI:  [name] is a 46 y.o. female who presents today for follow-up of her abdominal pain, hypokalemia, and results of her recent pelvic exam with testing.  She states that since her visit on the 26th her abdominal pain is continued to worsen, is progressed to the point is beginning to impair her ability to work.  The pain presents with generalized bilateral lower quadrant abdominal pain that refers to the groin and down each leg producing a tingling sensation when severe.  The pain is moderate relieved with ibuprofen, however, she is only attempted this treatment on a single occasion.  The patient tested to a history of endometriosis in 2001 requiring surgery with laser removal.  Since that time she has had appendicitis s/p appendectomy, renal calculi requiring surgical removal, and a small bowel obstruction requiring hospitalization.  She attempted to obtain an appointment with gynecology, however, given her lack of insurance she was unable to schedule this appointment.  Hypokalemia: Patient with previously diagnosed with hypokalemia was called and notified of her potassium level being 2.7.  PO potassium tablets at 20 mEq twice daily was prescribed which she completed.  In addition she requests explanation regarding the finding of the Pap smear indicating ASCUS into the specific meaning of this finding.  Past Medical History:  Diagnosis Date  . Asthma due to seasonal allergies    MILD--  NO INHALER  . Endometriosis of pelvis   . GERD (gastroesophageal reflux disease)   . H/O hiatal hernia   . History of hypertension    NO ISSUES SINCE WT LOSS AFTER GASTRIC BANDING  . History of obstructive sleep apnea    DX'D PRIOR TO GASTRIC BANDING  2010 --  NO ISSUES SINCE WT LOSS  . Renal calculus, left    NON-OBSTRUCTING  . Right ureteral stone   . Urgency of urination    Review of Systems:  ROS negative except as per HPI>  Physical Exam:  There were no vitals  filed for this visit. Physical Exam  Constitutional: She appears well-developed and well-nourished.  Non-toxic appearance. She does not appear ill.  Cardiovascular: Normal rate, regular rhythm and normal heart sounds.  Pulmonary/Chest: Effort normal and breath sounds normal.  Abdominal: Normal appearance, normal aorta and bowel sounds are normal. She exhibits no distension and no mass. There is tenderness (Bilateral lower quadrants radiating to the groin) in the right lower quadrant, suprapubic area and left lower quadrant. There is no rigidity, no rebound, no guarding and no CVA tenderness.  Musculoskeletal: She exhibits tenderness (Tenderness to palpation overlying the posterior iliac crest). She exhibits no edema.  Vitals reviewed.   Assessment & Plan:   See Encounters Tab for problem based charting.  Patient seen with Dr. Beryle Beams

## 2017-03-21 NOTE — Assessment & Plan Note (Signed)
Assessment: Explained the finding of atypical squamous cells of undetermined significance  Plan: Confirmed that the referral to gynecology have been placed.  However, the patient does not have insurance and is unable to pay the [place] of [place] cost for further evaluation. In communication with the OB/GYN services of [place] requesting their assistance with this patient given her need for evaluation and lack of insurance.

## 2017-03-21 NOTE — Patient Instructions (Signed)
If your pain worsens, you develop fever, chills, night sweats, muscle aches or other concerning symptoms please call our clinic number any time of the day or visit the emergency department.  We will call you with the results of the CT scan and blood work.  Please call and work with scheduling an appointment with the obstetrician. This is essential for evaluation of your abdominal pain and the findings of your PAP smear.   Thank you for you visit to the [facility].

## 2017-03-21 NOTE — Progress Notes (Signed)
Medicine attending: I personally interviewed and briefly examined this patient on the day of the patient visit and reviewed pertinent clinical ,laboratory, and radiographic data  with resident physician Dr. Kathi Ludwig and we discussed a management plan. Follow up for lower abd pain just eval in this office by Dr Alan Ripper. Normal pelvic. Neg for STDs. Pos HPV. +Micro hematuria. Hx of renal stones. Hx of laser Rx in past for endometriosis. Hx abd surg for bowel obstruction.  Broad differential but based on current exam, I favor recurrent endometriosis. CT today - if no obstructing stones, GYN referral. Discussed at length w pt.

## 2017-03-21 NOTE — Assessment & Plan Note (Signed)
Assessment: Patient noted to be hypokalemic with a routine BMP on her last visit. Patient was given p.o. potassium 20 mEq twice daily and has completed this course.  Plan: Repeat BMP today Will prescribe additional potassium as indicated.

## 2017-03-21 NOTE — H&P (Signed)
Date: 03/21/2017               Patient Name:  Meagan Dean MRN: [id number]  DOB: 1970-08-23 Age / Sex: 46 y.o., female   PCP: Valinda Party, DO         Medical Service: Internal Medicine Teaching Service         Attending Physician: Dr. Bartholomew Crews, MD    First Contact: Dr. Isac Sarna Pager: [phone]  Second Contact: Dr. Philipp Ovens  Pager: [phone]       After Hours (After 5p/  First Contact Pager: [phone]  weekends / holidays): Second Contact Pager: [phone]   Chief Complaint: Hypokalemia   History of Present Illness:  Meagan Dean is a 46 y.o. Female with history of GERD, HTN, gastric banding, nephrolithiasis who presents as a direct admission after being found to be hypokalemic. Patient was seen in clinic today for follow up of generalized abdominal pain and hypokalemia (K 2.7) noted on 10/26 clinic visit. Repeat BMP revealed K 2.0 and patient was called for need for hospital admission. When seen, patient complained of ongoing lower abdominal pain and asked for pain medications multiple times. States pain has been present for 2 months and has progressively worsened. Reports good urine output and denies urinary symptoms. Good appetite and states she is whatever she likes, not following any specific diet at this time. Reports nausea and chronic emesis (2x/week). She denies fever and chills. She also reports a funny feeling in her heart but no chest pain, muscle cramps, and muscle weakness that started a few days ago. No history of cardiac disease. Reports family history of hypokalemia in sibling and possibly father as well. Appeared stable when seen.   Review of Systems: A complete ROS was negative except as per HPI.   Meds:  Current Meds  Medication Sig  . cetirizine (ZYRTEC) 10 MG tablet Take 10 mg daily by mouth.  . diphenhydrAMINE (BENADRYL) 25 mg capsule Take 25 mg every 6 (six) hours as needed by mouth for allergies or sleep.  . phenylephrine  (SUDAFED PE) 10 MG TABS tablet Take 10 mg every 4 (four) hours as needed by mouth (sinus congestion).     Allergies: Allergies as of 03/21/2017 - Review Complete 03/21/2017  Allergen Reaction Noted  . Other Itching 11/17/2015  . Vicodin [hydrocodone-acetaminophen] Other (See Comments) 06/06/2011   Past Medical History:  Diagnosis Date  . Asthma due to seasonal allergies    MILD--  NO INHALER  . Endometriosis of pelvis   . GERD (gastroesophageal reflux disease)   . H/O hiatal hernia   . History of hypertension    NO ISSUES SINCE WT LOSS AFTER GASTRIC BANDING  . History of obstructive sleep apnea    DX'D PRIOR TO GASTRIC BANDING  2010 --  NO ISSUES SINCE WT LOSS  . Renal calculus, left    NON-OBSTRUCTING  . Right ureteral stone   . Urgency of urination     Family History:  Family History  Problem Relation Age of Onset  . Cancer Maternal Grandmother     Social History:  Social History   Tobacco Use  . Smoking status: Never Smoker  . Smokeless tobacco: Never Used  Substance Use Topics  . Alcohol use: Yes    Alcohol/week: 0.0 oz    Comment: occasionally  . Drug use: No     Physical Exam: Blood pressure 138/73, pulse (!) 50, temperature 98.5 F (36.9 C), temperature source Oral, resp.  rate 17, height 5\' 2"  (1.575 m), weight 165 lb 11.2 oz (75.2 kg), SpO2 98 %.  General: very pleasant female, well-nourished, well-developed, in pain but in no acute distress  Cardiac: regular rate and rhythm, nl S1/S2, no murmurs, rubs or gallops  Pulm: CTAB, no wheezes or crackles, no increased work of breathing  Abd: soft, NTND, bowel sounds present, CVA tenderness bilaterally worse on the L  Neuro: A&Ox3, able to move all 4 extremities with no focal deficits noted  Ext: warm and well perfused, no peripheral edema   EKG: pending    Assessment & Plan by Problem:  Meagan Dean is a 46 y.o. Female with history of GERD, HTN, gastric banding, nephrolithiasis who presents as a  direct admission after being found to be hypokalemic with K 2.0, unclear etiology at this time.   # Hypokalemia: Patient presents from clinic with K 2.0. She reports a "funny feeling" in her heart and muscle cramps. Review of chart reveals that she has been persistently hypokalemic since gastric banding approximately 1 year ago, though never this severe. I question whether this GI procedure is the etiology of her hypokalemia, as K absorption occurs mostly in the small bowel and her gastric mucosa remains intact. Sodium and bicarbonate are normal and she is currently normotensive arguing against an abnormal endocrinology process such as hyperaldosteronism. She endorses nausea and chronic emesis (2x/week), but low suspicion for GI losses as emesis not severe. RTA remains in the differential, specifically type I given her history of nephrolithiasis. However, no anion gap or metabolic acidosis present. She also reports a family history of hypokalemia and nephrolithiasis in one of her siblings. Not currently on medications that could induce hypokalemia. She is currently stable from a cardiac standpoint. EKG not available for review. Will check Magnesium and give IV and PO repletion.  - F/u CBC, BMP, Mg, urine pH, UA, TSH, and CK  - 79mEq IV potassium x6 + 40 mEq Kdur BID  - On telemetry   # Abdominal pain: Patient presented to clinic for follow up of generalized lower abdominal pain that is thought to be secondary to chronic nephrolithiasis. She was previously seen in clinic on 10/26 at which time her renal function was normal and she was referred to urology. Unfortunately, the patient did not follow up due to lack of insurance. CT renal done today with visible stones at the ureterovesical junction, official report pending. CT A/P from 4/20 without evidence of nephrolithiasis and hydronephrosis. She does have a history of nephrolithiasis in 2015 with hydronephrosis requiring hospitalization, stent placement, and  lithotripsy. - Urology consult in AM pending CT report  - IV dilaudid 0.5 mg q6h PRN  - IV Zofran q8h PRN   F: none  E: will continue to monitor K and replete as needed  N: Regular diet   VTE ppx: SQ Lovenox   Code status: Full code, not confirmed on admission   Dispo: Admit patient to Observation with expected length of stay less than 2 midnights.  Signed: Welford Roche, MD  Internal Medicine PGY-1  P [phone]

## 2017-03-21 NOTE — Addendum Note (Signed)
Addended by: Nicola Girt on: 03/21/2017 09:04 PM   Modules accepted: Level of Service

## 2017-03-21 NOTE — Progress Notes (Signed)
Report called to Raven , RN on [place].  Patient is alert and oriented.  Accompanied by friend.  Patient to be transported to [place] via wheelchair.  Sander Nephew, RN 03/21/2017 5:18 PM.

## 2017-03-21 NOTE — Assessment & Plan Note (Signed)
Assessment: Amenorrhea since March 2018. CT in March and April of same year fail to note ovarian pathology. Unknown family history concerning for early menopause as patient's mother passed at age 46.  Plan: Referred to gynecology for further evaluation.

## 2017-03-22 LAB — BASIC METABOLIC PANEL
ANION GAP: 6 (ref 5–15)
Anion gap: 5 (ref 5–15)
BUN: 9 mg/dL (ref 6–20)
BUN: 8 mg/dL (ref 6–20)
CO2: 25 mmol/L (ref 22–32)
CO2: 27 mmol/L (ref 22–32)
CREATININE: 0.75 mg/dL (ref 0.44–1.00)
Calcium: 8.1 mg/dL — ABNORMAL LOW (ref 8.9–10.3)
Calcium: 8.6 mg/dL — ABNORMAL LOW (ref 8.9–10.3)
Chloride: 109 mmol/L (ref 101–111)
Chloride: 107 mmol/L (ref 101–111)
Creatinine, Ser: 0.69 mg/dL (ref 0.44–1.00)
GFR calc Af Amer: >60 mL/min (ref >60)
GFR calc non Af Amer: >60 mL/min (ref >60)
GLUCOSE: 83 mg/dL (ref 65–99)
Glucose, Bld: 78 mg/dL (ref 65–99)
POTASSIUM: 3 mmol/L — AB (ref 3.5–5.1)
Potassium: 2.9 mmol/L — ABNORMAL LOW (ref 3.5–5.1)
SODIUM: 139 mmol/L (ref 135–145)
Sodium: 140 mmol/L (ref 135–145)

## 2017-03-22 LAB — NA AND K (SODIUM & POTASSIUM), RAND UR
Potassium Urine: 22 mmol/L
SODIUM UR: 190 mmol/L

## 2017-03-22 LAB — IRON AND TIBC
Iron: 56 ug/dL (ref 28–170)
SATURATION RATIOS: 16 % (ref 10.4–31.8)
TIBC: 351 ug/dL (ref 250–450)
UIBC: 295 ug/dL

## 2017-03-22 LAB — OSMOLALITY, URINE: Osmolality, Ur: 840 mOsm/kg (ref 300–900)

## 2017-03-22 LAB — FERRITIN: Ferritin: 15 ng/mL (ref 11–307)

## 2017-03-22 LAB — HIV ANTIBODY (ROUTINE TESTING W REFLEX): HIV Screen 4th Generation wRfx: NONREACTIVE

## 2017-03-22 LAB — SAVE SMEAR

## 2017-03-22 LAB — LACTATE DEHYDROGENASE: LDH: 128 U/L (ref 98–192)

## 2017-03-22 LAB — OSMOLALITY: Osmolality: 289 mOsm/kg (ref 275–295)

## 2017-03-22 MED ORDER — KETOROLAC TROMETHAMINE 30 MG/ML IJ SOLN
30.0000 mg | Freq: Three times a day (TID) | INTRAMUSCULAR | Status: DC | PRN
  Administered 2017-03-23 (×2): 30 mg via INTRAVENOUS
  Filled 2017-03-22 (×2): qty 1

## 2017-03-22 MED ORDER — POTASSIUM CHLORIDE 10 MEQ/100ML IV SOLN
10.0000 meq | INTRAVENOUS | Status: AC
  Administered 2017-03-22 (×3): 10 meq via INTRAVENOUS
  Filled 2017-03-22 (×3): qty 100

## 2017-03-22 MED ORDER — KETOROLAC TROMETHAMINE 30 MG/ML IJ SOLN
30.0000 mg | Freq: Three times a day (TID) | INTRAMUSCULAR | Status: DC
  Administered 2017-03-22: 30 mg via INTRAVENOUS
  Filled 2017-03-22: qty 1

## 2017-03-22 MED ORDER — LORATADINE 10 MG PO TABS
10.0000 mg | ORAL_TABLET | Freq: Every day | ORAL | Status: DC
  Administered 2017-03-22 – 2017-03-23 (×2): 10 mg via ORAL
  Filled 2017-03-22 (×2): qty 1

## 2017-03-22 NOTE — Progress Notes (Signed)
   Subjective:  No acute event overnight.  Continues to complain of lower abdominal pain.  Minimal improvement with Dilaudid.  Continues to endorse numbness in her legs.  Denies chest pain.  No other acute complaints this morning. Of note, patient reported mother died of sudden death after C-section.  She also has a causing who died from a fatal PE after starting contraceptive therapy.   Objective:  Vital signs in last 24 hours: Vitals:   03/21/17 1757 03/21/17 2307  BP: 138/73   Pulse: (!) 50   Resp: 17   Temp: 98.5 F (36.9 C)   TempSrc: Oral   SpO2: 98% 98%  Weight: 165 lb 11.2 oz (75.2 kg)   Height: 5\' 2"  (1.575 m)    General: Very pleasant female, well-nourished, well-developed, lying in bed in no acute distress Cardiac: regular rate and rhythm, nl S1/S2, no murmurs, rubs or gallops  Pulm: CTAB, no wheezes or crackles, no increased work of breathing  Abd: soft, NTND, bowel sounds present Neuro: A&Ox3, able to move all 4 extremities with no focal deficits noted Ext: warm and well perfused, no peripheral edema   Assessment/Plan:  Active Problems:   Hypokalemia  Meagan Dean is a 46 y.o. Female with history of GERD, HTN, gastric banding, nephrolithiasis who presents as a direct admission after being found to be hypokalemic with K 2.0, unclear etiology at this time.   # Hypokalemia: K 2.9 this AM, therefore will continue to replete as below. Workup for hypokalemia pending. Patient continues to report lower extremity numbness, otherwise doing well.  Currently hemodynamically stable.  EKG normal without signs of ischemia. Discussed patient with Dr. Justin Mend who states diuretic abuse possible in the setting of weight loss issues.  Borchers and Gettleman syndrome also in the differential given family history.  However, the patient would need genetic testing for this she is uninsured.  Recommended obtaining lab work to calculate trans-tubular potassium gradient (TTK gradient) to determine  if K loss is renal versus GI. If TTK gradient high, it would be indicative of renal losses.  We will follow-up workup as below and continue to replete potassium.  - F/u VBG, hyperaldo studies, ferritin, peripheral blood smear  - F/u Uosm, U Na, U potassium, and serum osm  - LDH nl  - 63mEq IV potassium x3 + 40 mEq Kdur BID  - On telemetry  - BMP in AM  - Consider genetic testing in the outpatient setting once patient gets insurance   # Abdominal pain:  UA without hematuria and CT with out evidence of nephrolithiasis.  Abdominal pain thought to be secondary to uterine fibroids demonstrated on previous imaging versus endometriosis which patient has require exploratory laparotomy and surgical intervention for in the past.  Will need GYN consult as an outpatient for further evaluation and management. - IV Toradol q8h PRN  - IV Zofran q8h PRN   F: none  E: will continue to monitor K and replete as needed  N: Regular diet   VTE ppx: SQ Lovenox   Code status: Full code, not confirmed on admission    Dispo: Anticipated discharge in approximately 2-3 day(s) pending improvement in potassium level.   Welford Roche, MD  Internal Medicine PGY-1  P [phone]

## 2017-03-22 NOTE — Progress Notes (Signed)
Patient mentioned having mild discomfort when urinating. MD notified.

## 2017-03-22 NOTE — Progress Notes (Signed)
Date: 03/22/2017  Patient name: Meagan Dean  Medical record number: [id number]  Date of birth: 10-31-1970   I have seen and evaluated Meagan Dean and discussed their care with the Residency Team. Meagan Dean is a 46 year old woman who had a history of hypertension and OSA which both resolved after gastric banding in 2010. In 2015, he started having hypokalemia on her blood work. This is not associated with a metabolic alkalosis and no apparent etiology was found. She also had recurrent hematuria and had a cystoscopy in 2015 due to right hydronephrosis and she had stent placement. She has continued to have intermittent hematuria despite no kidney stones on imaging.  She has a history of endometriosis which was treated surgically and [place]. Of note, pathology notes from her appendectomy in 2016 did not note any endometrial tissue on the appendix. Subsequently, she was treated for fibroids at [facility]. She has had fibroids documented on numerous imaging studies.  On November 9, she presented to the [facility] for follow-up of chronic abdominal pain of 2 months duration and hypokalemia. Her potassium was found to be 2.4 and she was admitted to the hospital from the clinic. On admission, she endorsed generalized abdominal pain which has been worsening over 2 months. It has not affected her appetite. She has intermittent nausea and emesis but only about 2 times per week. She denies any myalgias or weakness. This morning, she has had no significant change in her status. Her potassium has increased with supplementation to 2.9.  PMHx, Fam Hx, and/or Soc Hx : Mother unexpected sudden death after C section. Cousin fatal PE. 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 No orange card - makes too much. No insurance through job - too $.  Vitals:   03/21/17 1757 03/21/17 2307  BP: 138/73   Pulse: (!) 50   Resp: 17   Temp: 98.5 F (36.9 C)   SpO2: 98% 98%  NAD, sitting SOB Neuro " alert, moving all 4,  speech clear Skin warm and dry  K consistently low since 0998 No metabolic alkalosis 3 low mag levels 2015, otherwise nl Cr nl HgB 10.9 about baseline MCV 84 RDW 14.5 UA 30 protein, pH 5, 0-5 RBC  I personally viewed the CT images and confirmed my reading with the official read. Decompressed bladder, no renal stones, large uterus  I personally viewed the EKG and confirmed my reading with the official read. Sinus,nl axis, nl intervals, no ischemic changes  Assessment and Plan: I have seen and evaluated the patient as outlined above. I agree with the formulated Assessment and Plan as detailed in the residents' note, with the following changes: Meagan Dean is a 46 year old woman with history of gastric sleeve placement, hypokalemia, endometriosis, uterine fibroids, and 2 months of increasing abdominal pain. She was admitted for hypokalemia. Hypokalemia is improving but she continues to require supplementation.   1.  Hypokalemia - she is on no medication that would result in hyperkalemia. She reports no frequent and significant GI loss and is not alkalotic on BMP nor is her urine specific gravity elevated so I doubt GI losses as etiology. She is not on a diuretic which would increase urine potassium losses but we will check a renin and aldosterone level. Her magnesium level is normal. Rare causes would include Gitelman syndrome which would require genetic testing which would not be prudent in her due to lack of payor source. Type I or type II RTA would be unlikely as not  acidotic. The team discussed with Meagan Dean who added diuretic use to the differential. The team looked into ordering a diuretic screen that that is a send out test and she is uninsured so we will not pursue it. We will inquire about diuretic use. He also thought bulimia/vomiting is likely. She only endorsed a couple episodes a week but we will also approach this differential sensitively. He also agreed Gittleman's is on the differential  but that is a send out test for genetic studies. We can mention this option to her that I feel financially it may be out of her reach. Finally, we are checking urine studies. If we can find no etiology and she denies frequent vomiting or diuretic use, we can consider spironolactone plus oral potassium with very close follow-up.  2. Abdominal pain for 2 months  - despite imaging, we have found no etiology. Due to her history of endometriosis and fibroids, it is likely that these could be causing her pain. However, endometriosis should resolve with menopause and she is now perimenopausal. However, it can leave scar tissue. Additionally, she has had abdominal surgeries which can also leave scar tissue and adhesions. We are encouraging follow-up with gynecology as an outpatient either with lack of insurance, this may be difficult. Avoid opioids.  Meagan Crews, MD 11/10/201811:20 AM

## 2017-03-23 LAB — BASIC METABOLIC PANEL
ANION GAP: 4 — AB (ref 5–15)
ANION GAP: 5 (ref 5–15)
BUN: 11 mg/dL (ref 6–20)
BUN: 12 mg/dL (ref 6–20)
CHLORIDE: 108 mmol/L (ref 101–111)
CHLORIDE: 107 mmol/L (ref 101–111)
CO2: 27 mmol/L (ref 22–32)
CO2: 27 mmol/L (ref 22–32)
Calcium: 8.6 mg/dL — ABNORMAL LOW (ref 8.9–10.3)
Calcium: 8.5 mg/dL — ABNORMAL LOW (ref 8.9–10.3)
Creatinine, Ser: 0.73 mg/dL (ref 0.44–1.00)
Creatinine, Ser: 0.69 mg/dL (ref 0.44–1.00)
GFR calc non Af Amer: >60 mL/min (ref >60)
GFR calc non Af Amer: >60 mL/min (ref >60)
Glucose, Bld: 97 mg/dL (ref 65–99)
Glucose, Bld: 81 mg/dL (ref 65–99)
POTASSIUM: 3.3 mmol/L — AB (ref 3.5–5.1)
Potassium: 3.9 mmol/L (ref 3.5–5.1)
SODIUM: 139 mmol/L (ref 135–145)
Sodium: 139 mmol/L (ref 135–145)

## 2017-03-23 LAB — T4, FREE: Free T4: 0.88 ng/dL (ref 0.61–1.12)

## 2017-03-23 MED ORDER — SPIRONOLACTONE 25 MG PO TABS: 12.5000 mg | ORAL_TABLET | Freq: Once | ORAL | 0 refills | Status: DC

## 2017-03-23 MED ORDER — POTASSIUM CHLORIDE 10 MEQ/100ML IV SOLN: 10.0000 meq | INTRAVENOUS | Status: DC

## 2017-03-23 MED ORDER — POTASSIUM CHLORIDE 10 MEQ/100ML IV SOLN
10.0000 meq | INTRAVENOUS | Status: AC
  Administered 2017-03-23 (×3): 10 meq via INTRAVENOUS
  Filled 2017-03-23 (×4): qty 100

## 2017-03-23 MED ORDER — SPIRONOLACTONE 25 MG PO TABS: 12.5000 mg | ORAL_TABLET | Freq: Every day | ORAL | 0 refills | Status: DC

## 2017-03-23 MED ORDER — POTASSIUM CHLORIDE CRYS ER 20 MEQ PO TBCR: 40.0000 meq | EXTENDED_RELEASE_TABLET | Freq: Every day | ORAL | 0 refills | Status: DC

## 2017-03-23 MED ORDER — SPIRONOLACTONE 25 MG PO TABS
12.5000 mg | ORAL_TABLET | Freq: Once | ORAL | Status: AC
  Administered 2017-03-23: 12.5 mg via ORAL
  Filled 2017-03-23: qty 1

## 2017-03-23 NOTE — Discharge Summary (Signed)
Name: Meagan Dean MRN: [id number] DOB: [birth year] 46 y.o. PCP: Valinda Party, DO  Date of Admission: 03/21/2017  5:43 PM Date of Discharge: 03/23/2017 Attending Physician: Bartholomew Crews, MD  Discharge Diagnosis: 1. Hypokalemia  2. Abdominal Pain   Discharge Medications: Allergies as of 03/23/2017      Reactions   Other Itching   Reaction to powder in gloves   Vicodin [hydrocodone-acetaminophen] Other (See Comments)   Made her feel "high"      Medication List    TAKE these medications   cetirizine 10 MG tablet Commonly known as:  ZYRTEC Take 10 mg daily by mouth.   diphenhydrAMINE 25 mg capsule Commonly known as:  BENADRYL Take 25 mg every 6 (six) hours as needed by mouth for allergies or sleep.   phenylephrine 10 MG Tabs tablet Commonly known as:  SUDAFED PE Take 10 mg every 4 (four) hours as needed by mouth (sinus congestion).   potassium chloride SA 20 MEQ tablet Commonly known as:  K-DUR,KLOR-CON Take 2 tablets (40 mEq total) daily by mouth.   spironolactone 25 MG tablet Commonly known as:  ALDACTONE Take 0.5 tablets (12.5 mg total) once for 1 dose by mouth.       Disposition and follow-up:   [name] was discharged from [facility] in Stable condition.  At the hospital follow up visit please address:  1.  Hypokalemia: Chronic, unclear etiology. Please repeat BMP at follow up. Patient was discharged on 40 mEq of potassium daily and spironolactone 12.5 mg. Please follow up BP to ensure she can tolerate diuretic, she was normotensive throughout her hospitalization.   2. Abdominal Pain: CT renal stone study negative. Likely secondary to endometriosis and uterine fibroids. Please ensure patient has follow up with gynecology.   2.  Labs / imaging needed at time of follow-up: BMP  3.  Pending labs/ test needing follow-up: Aldosterone + Renin activity and ratio   Follow-up Appointments:   Hospital Course by  problem list:  1. Hypokalemia: Patient was admitted from IM clinic after labs resulted with a potassium of 2.4. She endorsed symptoms of lower extremity muscle cramps and a "funny feeling" in her chest. EKG was sinus rhythm. She was placed on telemetry and given both IV and PO potassium replacement. Potassium up trended appropriately during her stay, 3.9 on discharge. Etiology of her hypokalemia is unclear. This is a chronic issue for her dating back to at least 2015 per our records and requiring hospitalization in the past. Her trans tubular potassium gradient was calculated this hospitalization and appropriately low at 2.61 in the setting of her hypokalemia, indicating a non renal etiology for her potassium loss. She denied diuretic use/ abuse and takes no prescription medications. Renal tubular acidosis was felt to be unlikely as she was not acidotic and her bicarb was normal. Hyperaldosteronism was also felt to be unlikely as she was normotensive, however renin aldosterone ratio was checked and pending on discharge. Differential on discharge was felt to include GI losses vs. Genetic inherited disorders like gitelman or bartter syndrome. Patient does endorse chronic nausea and vomiting s/p lab band procedure 1 year ago complicated by SBO requiring lysis of adhesions - however her hypokalemia pre dates her GI procedures. She reports vomiting at most 1-2 times a week. It is unlikely that this would cause such significant electrolyte abnormality, but it is possible that patient is down playing her GI symptoms. Bulimia nervosa is also on the differential, however  this can be difficulty to diagnosis and she currently denies excessive or self induced vomitting. Interestingly, patient reports a brother that also has chronic hypokalemia and is otherwise healthy. If hypokalemia persists, genetic testing may be beneficial. Given that she is currently uninsured we held off on testing thsi hospitalization. Genetic studies  are not offered here at [facility] and would require send out labs to [facility] or [place]. She was discharged on 40 mEq of potassium daily and 12.5 mg of spironolactone. Please repeat BMP at follow up.   2. Abdominal Pain: Patient has lower abdominal pain that radiates to her pelvis that has been constant over the past 2 months. She was seen in clinic one month ago and there was initial concern for nephrolithiasis. She was noted to have hematuria on UA and significant urological history requiring stenting and lithotripsy in the past. However repeat CT on admission was negative for stones. Repeat urinalysis showed resolution of her hematuria. Patient endorsed a history of uterine fibroids requiring surgical resection and endometriosis requiring surgical ablation a few years ago at [facility]. Pain is likely due to recurrence / growth of her uterine fibroids and or endometriosis. She was recently referred to gynecology for abnormal pap smear. She was encouraged to follow up with gynecology for further management.   Discharge Vitals:   BP 138/73 (BP Location: Right Arm)   Pulse (!) 50   Temp 98.5 F (36.9 C) (Oral)   Resp 17   Ht 5\' 2"  (1.575 m)   Wt 165 lb 11.2 oz (75.2 kg)   SpO2 98%   BMI 30.31 kg/m   Pertinent Labs, Studies, and Procedures:   Results for Meagan Dean, Meagan Dean (MRN [id number]) as of 03/23/2017 14:05  Ref. Range 03/21/2017  03/22/2017  03/22/2017  03/23/2017  03/23/2017   Sodium Latest Ref Range: 135 - 145 mmol/L 141 140 139 139 139  Potassium Latest Ref Range: 3.5 - 5.1 mmol/L 2.4 (LL) 2.9 (L) 3.0 (L) 3.3 (L) 3.9  Chloride Latest Ref Range: 101 - 111 mmol/L 104 109 107 108 107  CO2 Latest Ref Range: 22 - 32 mmol/L 30 25 27 27 27   Glucose Latest Ref Range: 65 - 99 mg/dL 78 78 83 97 81  BUN Latest Ref Range: 6 - 20 mg/dL 10 9 8 11 12   Creatinine Latest Ref Range: 0.44 - 1.00 mg/dL 0.80 0.69 0.75 0.73 0.69  Calcium Latest Ref Range: 8.9 - 10.3 mg/dL 9.2 8.1 (L) 8.6 (L) 8.6 (L) 8.5 (L)  Anion gap  Latest Ref Range: 5 - 15  7 6 5 4  (L) 5    03/21/2017 CT Renal Stone Study:  IMPRESSION: 1. Negative for nephrolithiasis, hydronephrosis, or ureteral stone 2. Status post gastric banding. Mildly dilated distal esophagus with fluid and mild wall thickening. 3. No change in small umbilical/periumbilical hernia containing small bowel. Negative for an obstruction.  Discharge Instructions: Discharge Instructions    Call MD for:  difficulty breathing, headache or visual disturbances   Complete by:  As directed    Call MD for:  persistant dizziness or light-headedness   Complete by:  As directed    Call MD for:  persistant nausea and vomiting   Complete by:  As directed    Diet - low sodium heart healthy   Complete by:  As directed    Discharge instructions   Complete by:  As directed    [name],  It was a pleasure taking care of you. Please continue to take potassium  supplements 40 mEq once daily. I have also started you on spironolactone 12.5 mg (1/2 of a 25 mg tablet) daily to prevent your potassium from dropping. Please follow up in our clinic next week to have your blood rechecked. We will work on getting you in to see a gynecologist for your fibroids. If you have any questions or concerns, call our clinic at [phone] or after hours call [phone] and ask for the internal medicine resident on call. Thank you!  - Dr. Philipp Ovens   Increase activity slowly   Complete by:  As directed       Signed: Velna Ochs, MD 03/23/2017, 2:02 PM   Pager: [phone]

## 2017-03-23 NOTE — Plan of Care (Signed)
Patient progressing 

## 2017-03-23 NOTE — Progress Notes (Signed)
   Subjective: Reports that she feels "drained" today. Persistent abdominal pain, now with dysuria and urinary frequency.   Objective:  Vital signs in last 24 hours: Vitals:   03/21/17 1757 03/21/17 2307  BP: 138/73   Pulse: (!) 50   Resp: 17   Temp: 98.5 F (36.9 C)   TempSrc: Oral   SpO2: 98% 98%  Weight: 165 lb 11.2 oz (75.2 kg)   Height: 5\' 2"  (1.575 m)     General: Very pleasant female, well-nourished, well-developed, lying in bed in no acute distress Cardiac: regular rate and rhythm, nl S1/S2, no murmurs, rubs or gallops  Pulm: CTAB, no wheezes or crackles, no increased work of breathing  Abd: soft, NTND, bowel sounds present Neuro: A&Ox3, able to move all 4 extremities with no focal deficits noted Ext: warm and well perfused, no peripheral edema  Assessment/Plan:  Meagan Dean is a 46 y.o. Female with history of GERD, HTN, gastric banding, nephrolithiasis who presents as a direct admission after being found to be hypokalemic with K 2.0, unclear etiology at this time.  # Hypokalemia: Improving, 3.3 this AM from 2.4 on admission. Unclear etiology. This is a chronic issue requiring hospitalization in the past. Trans tubular potassium gradient is 2.61, appropriately low in the setting of her hypokalemia indicated a non renal etiology for her potassium loss. She denies diuretic use and is currently on no prescription medication. RTAs and hyperaldosteronism are unlikely given that she is not acidotic with a normal bicarb and she is normotensive. Differential at this point includes GI losses from her chronic N/V, however patient reports vomiting at most two times a week which would be unlikely to cause such significant electrolyte abnormalities. It is possible that patient is down playing her GI symptoms. Bulimia nervosa is also on the differential, however this can be very difficult to diagnose and she current denies. Other etiologies also include genetic inherited disorders such as  gitelman and bartter syndrome, especially given her family history of hypokalemia in her brother, however this requires genetic testing that is not offered here at [facility]. Given that she is uninsured and this would not change current management, we will hold off on testing for now. Discussed starting spironolactone to help retain potassium and patient is agreeable. She is currently normotensive, we will monitor her BP today to ensure she can tolerate a diuretic.  -- Start spironolactone 25 mg daily -- Additional 39mEq IV potassium x3 today  -- Continue 40 mEq Kdur PO BID -- F/u VBG, hyperaldo studies, ferritin, peripheral blood smear  -- On telemetry -- BMP this afternoon, if stable my discharge  -- Consider genetic testing in the outpatient setting once patient gets insurance   # Abdominal pain: UA without hematuria and CT with out evidence of nephrolithiasis.  Abdominal pain thought to be secondary to uterine fibroids demonstrated on previous imaging versus endometriosis which patient has require exploratory laparotomy and surgical intervention for in the past.  Will need GYN consult as an outpatient for further evaluation and management. -- IV Toradol q8h PRN  -- IV Zofran q8h PRN -- F/u Gynecology outpatient   F: none  E: will continue to monitor K and repleteas needed N: Regular diet   VTE ppx:SQ Lovenox  Code status: Full code, not confirmed on admission  Dispo: Anticipated discharge in approximately 1-2  day(s).   Velna Ochs, MD 03/23/2017, 7:58 AM Pager: [phone]

## 2017-03-23 NOTE — Progress Notes (Signed)
[name] to be D/C'd Home per MD order. Discussed with the patient and all questions fully answered.  Allergies as of 03/23/2017      Reactions   Other Itching   Reaction to powder in gloves   Vicodin [hydrocodone-acetaminophen] Other (See Comments)   Made her feel "high"      Medication List    TAKE these medications   cetirizine 10 MG tablet Commonly known as:  ZYRTEC Take 10 mg daily by mouth.   diphenhydrAMINE 25 mg capsule Commonly known as:  BENADRYL Take 25 mg every 6 (six) hours as needed by mouth for allergies or sleep.   phenylephrine 10 MG Tabs tablet Commonly known as:  SUDAFED PE Take 10 mg every 4 (four) hours as needed by mouth (sinus congestion).   potassium chloride SA 20 MEQ tablet Commonly known as:  K-DUR,KLOR-CON Take 2 tablets (40 mEq total) daily by mouth.   spironolactone 25 MG tablet Commonly known as:  ALDACTONE Take 0.5 tablets (12.5 mg total) daily by mouth. Start taking on:  03/24/2017       VVS, Skin clean, dry and intact without evidence of skin break down, no evidence of skin tears noted.  IV catheter discontinued intact. Site without signs and symptoms of complications. Dressing and pressure applied.  An After Visit Summary was printed and given to the patient.  Patient escorted via [place], and D/C home via private auto.  Melonie Florida  03/23/2017 6:03 PM

## 2017-03-26 LAB — ALDOSTERONE + RENIN ACTIVITY W/ RATIO
ALDO / PRA RATIO: 0.9 (ref 0.0–30.0)
Aldosterone: 1.6 ng/dL (ref 0.0–30.0)
PRA LC/MS/MS: 1.71 ng/mL/h (ref 0.167–5.380)

## 2017-03-28 ENCOUNTER — Encounter: Payer: Self-pay | Admitting: Internal Medicine

## 2017-03-28 ENCOUNTER — Ambulatory Visit: Payer: Self-pay | Admitting: Internal Medicine

## 2017-03-28 VITALS — BP 134/92 | HR 84 | Temp 97.7°F | Ht 62.0 in | Wt 162.5 lb

## 2017-03-28 DIAGNOSIS — I10 Essential (primary) hypertension: Secondary | ICD-10-CM

## 2017-03-28 DIAGNOSIS — R109 Unspecified abdominal pain: Secondary | ICD-10-CM

## 2017-03-28 DIAGNOSIS — Z8742 Personal history of other diseases of the female genital tract: Secondary | ICD-10-CM

## 2017-03-28 DIAGNOSIS — D259 Leiomyoma of uterus, unspecified: Secondary | ICD-10-CM

## 2017-03-28 DIAGNOSIS — E876 Hypokalemia: Secondary | ICD-10-CM

## 2017-03-28 HISTORY — DX: Personal history of other diseases of the female genital tract: Z87.42

## 2017-03-28 HISTORY — DX: Essential (primary) hypertension: I10

## 2017-03-28 LAB — BASIC METABOLIC PANEL
Anion gap: 7 (ref 5–15)
BUN: 10 mg/dL (ref 6–20)
CALCIUM: 9.2 mg/dL (ref 8.9–10.3)
CHLORIDE: 106 mmol/L (ref 101–111)
CO2: 27 mmol/L (ref 22–32)
CREATININE: 0.92 mg/dL (ref 0.44–1.00)
GFR calc non Af Amer: >60 mL/min (ref >60)
Glucose, Bld: 102 mg/dL — ABNORMAL HIGH (ref 65–99)
Potassium: 3.4 mmol/L — ABNORMAL LOW (ref 3.5–5.1)
SODIUM: 140 mmol/L (ref 135–145)

## 2017-03-28 MED ORDER — POTASSIUM CHLORIDE ER 20 MEQ PO TBCR: 20.0000 meq | EXTENDED_RELEASE_TABLET | Freq: Every day | ORAL | 0 refills | Status: DC

## 2017-03-28 NOTE — Patient Instructions (Addendum)
Thank you for your visit to the [facility] today. We are glad to see you feeling much better.   Please continue to take your potassium as previously directed.   If at any time you were to develop any symptoms that concern you please do not hesitate to call the clinic.   Please schedule for your PCP Dr. Kalman Shan at her next available appointment on your way out today.

## 2017-03-28 NOTE — Assessment & Plan Note (Addendum)
Abdominal Pain: Assessment: Much better today, 3/10. Still radiating into the back, feels like there is something pressing against her anus. This is consistent with enlarging fibroids causing mass effect on the rectum inducing the sensation of the need to defecate. This was explained briefly with the patient.   Plan: Still needs to follow up with OBGYN.  Greatly concerned that this is worsening endometriosis vs enlarging uterine fibroids given her past history of the both Amenorrhea present since March of this year (2018), CT negative for acute process in April and November with the exception of the increased fibroid size and number.

## 2017-03-28 NOTE — Assessment & Plan Note (Addendum)
HTN: Assessment: Started on Spironolactone given long history or hypokalemia and recent diagnosis of HTN   Plan: Continue spironolactone at this time. Will reassess at her next appointment.

## 2017-03-28 NOTE — Progress Notes (Signed)
   CC: hypokalemia and abdominal pain  HPI:  [name] is a 46 y.o. female who presents today for follow-up of her hospital physician for abdominal pain and hypokalemia. She was initially hospitalized for potassium of 2.4 w/ associated abdominal pain approximately 1 wk prior. Following a brief stay for IV potassium, an abdominal CT and hyperaldosteronism evaluation she was released. PO potassium was prescribed. The CT was unremarkable, did not demonstrate renal stones or other definite etiological source of the pain. It is thought that her pain is most likely secondary to endometriosis to which she has yet been unable to obtain an appointment with an obstetric gynecologist. Denied nausea, vomiting, chest pain, leg pain, headache, fever, chills, dysuria, hematuria or blood in the stool. Attested to mild pain with defecation.   Past Medical History:  Diagnosis Date  . Asthma due to seasonal allergies    MILD--  NO INHALER  . Endometriosis of pelvis   . GERD (gastroesophageal reflux disease)   . H/O hiatal hernia   . History of endometriosis 03/28/2017  . History of hypertension    NO ISSUES SINCE WT LOSS AFTER GASTRIC BANDING  . History of obstructive sleep apnea    DX'D PRIOR TO GASTRIC BANDING  2010 --  NO ISSUES SINCE WT LOSS  . Renal calculus, left    NON-OBSTRUCTING  . Right ureteral stone   . Urgency of urination    Review of Systems:  ROS negative except as per HPI.  Physical Exam:  Vitals:   03/28/17 1443  BP: (!) 134/92  Pulse: 84  Temp: 97.7 F (36.5 C)  TempSrc: Oral  SpO2: 100%  Weight: 162 lb 8 oz (73.7 kg)  Height: 5\' 2"  (1.575 m)   Physical Exam  Constitutional: She appears well-nourished. No distress.  Cardiovascular: Normal rate and regular rhythm.  No murmur heard. Pulmonary/Chest: Effort normal and breath sounds normal. No stridor.  Abdominal: Soft. Bowel sounds are normal. She exhibits no distension. There is no tenderness.  Musculoskeletal: She  exhibits no edema or tenderness.  Vitals reviewed.  Reviewed CT with renal study from 11/09-enlarging fibroids noted which could be contributing to her presentation  Labs reviewed from 11/11-K+ 3.9 on discharge  Assessment & Plan:   See Encounters Tab for problem based charting.  Abdominal Pain: Assessment: Much better today, 3/10. Still radiating into the back, feels like there is something pressing against her anus. This is consistent with enlarging fibroids causing mass effect on the rectum inducing the sensation of the need to defecate. This was explained briefly with the patient.   Plan: Still needs to follow up with OBGYN.  Greatly concerned that this is worsening endometriosis give her past history of the same Amenorrhea, CT negative for acute process in April and November with the exception of the increased fibroid size and number.   Hypokalemia: Assessment: Patient feels well today. No acute complaints. Coul not fill her potassium script until this past Wednesday, 3 days.   Plan: Stat BMP for potassium check Continue potassium, will adjust as indicated  HTN: Assessment: Started on Spironolactone given long history or hypokalemia and recent diagnosis of HTN   Plan: Continue spironolactone at this time. Will reassess at her next appointment.   Patient seen with Dr. Angelia Mould

## 2017-03-28 NOTE — Assessment & Plan Note (Addendum)
Hypokalemia: Assessment: Patient feels well today. No acute complaints. Coul not fill her potassium script until this past Wednesday, 3 days.   Plan: Stat BMP for potassium check Continue potassium, will adjust as indicated  Addendum: Potassium 3.4, called patient and notified her to continue her current 40 BID and called in 20 daily thereafter. To be seen by her PCP in mid December who may consider BMP at that time and adjust as needed. P

## 2017-03-28 NOTE — Addendum Note (Signed)
Addended by: Nicola Girt on: 03/28/2017 04:46 PM   Modules accepted: Orders

## 2017-04-01 NOTE — Progress Notes (Signed)
Internal Medicine Clinic Attending  I saw and evaluated the patient.  I personally confirmed the key portions of the history and exam documented by Dr. Harbrecht and I reviewed pertinent patient test results.  The assessment, diagnosis, and plan were formulated together and I agree with the documentation in the resident's note.  

## 2017-04-02 ENCOUNTER — Other Ambulatory Visit: Payer: Self-pay | Admitting: Obstetrics and Gynecology

## 2017-04-02 DIAGNOSIS — Z1231 Encounter for screening mammogram for malignant neoplasm of breast: Secondary | ICD-10-CM

## 2017-04-23 NOTE — Progress Notes (Signed)
   CC: Follow up on hypertension  HPI:  [name] is a 46 y.o. female with history noted below that presents to the Internal Medicine Clinic for follow up on hypertension. Please see problem based charting for the status of patient's chronic medical conditions.  Past Medical History:  Diagnosis Date  . Asthma due to seasonal allergies    MILD--  NO INHALER  . Endometriosis of pelvis   . GERD (gastroesophageal reflux disease)   . H/O hiatal hernia   . History of endometriosis 03/28/2017  . History of hypertension    NO ISSUES SINCE WT LOSS AFTER GASTRIC BANDING  . History of obstructive sleep apnea    DX'D PRIOR TO GASTRIC BANDING  2010 --  NO ISSUES SINCE WT LOSS  . Hypertension 03/28/2017  . Renal calculus, left    NON-OBSTRUCTING  . Right ureteral stone   . Urgency of urination     Review of Systems:  Review of Systems  Constitutional: Negative for malaise/fatigue.  Respiratory: Negative for shortness of breath.   Cardiovascular: Negative for chest pain.     Physical Exam:  Vitals:   04/24/17 1540  BP: 123/83  Pulse: 76  Temp: 98.4 F (36.9 C)  TempSrc: Oral  SpO2: 100%  Weight: 168 lb 11.2 oz (76.5 kg)  Height: 5\' 2"  (1.575 m)   Physical Exam  Constitutional: She is well-developed, well-nourished, and in no distress.  Cardiovascular: Normal rate, regular rhythm and normal heart sounds. Exam reveals no gallop and no friction rub.  No murmur heard. Pulmonary/Chest: Effort normal and breath sounds normal. No respiratory distress. She has no wheezes. She has no rales.  Musculoskeletal: She exhibits no edema.  Skin: Skin is warm and dry.    Assessment & Plan:   See encounters tab for problem based medical decision making.    Patient discussed with Dr. Evette Doffing

## 2017-04-24 ENCOUNTER — Encounter: Payer: Self-pay | Admitting: Internal Medicine

## 2017-04-24 ENCOUNTER — Ambulatory Visit: Payer: Self-pay | Admitting: Internal Medicine

## 2017-04-24 VITALS — BP 123/83 | HR 76 | Temp 98.4°F | Ht 62.0 in | Wt 168.7 lb

## 2017-04-24 DIAGNOSIS — I10 Essential (primary) hypertension: Secondary | ICD-10-CM

## 2017-04-24 DIAGNOSIS — E876 Hypokalemia: Secondary | ICD-10-CM

## 2017-04-24 DIAGNOSIS — Z79899 Other long term (current) drug therapy: Secondary | ICD-10-CM

## 2017-04-24 DIAGNOSIS — Z0000 Encounter for general adult medical examination without abnormal findings: Secondary | ICD-10-CM

## 2017-04-24 DIAGNOSIS — R87610 Atypical squamous cells of undetermined significance on cytologic smear of cervix (ASC-US): Secondary | ICD-10-CM

## 2017-04-24 DIAGNOSIS — R87810 Cervical high risk human papillomavirus (HPV) DNA test positive: Secondary | ICD-10-CM

## 2017-04-24 MED ORDER — SPIRONOLACTONE 25 MG PO TABS: 12.5000 mg | ORAL_TABLET | Freq: Every day | ORAL | 3 refills | Status: DC

## 2017-04-24 NOTE — Patient Instructions (Signed)
[name],  It was a pleasure seeing you today.  Please follow up in 6 months.  In the meantime please call the clinic with any questions or concerns.  If you have not heard from the OBGYN office in the next 2 weeks you need to call back the Internal Medicine Clinic.

## 2017-04-25 LAB — BMP8+ANION GAP
Anion Gap: 12 mmol/L (ref 10.0–18.0)
BUN / CREAT RATIO: 14 (ref 9–23)
BUN: 10 mg/dL (ref 6–24)
CO2: 29 mmol/L (ref 20–29)
CREATININE: 0.7 mg/dL (ref 0.57–1.00)
Calcium: 9.2 mg/dL (ref 8.7–10.2)
Chloride: 98 mmol/L (ref 96–106)
GFR calc non Af Amer: 104 mL/min/{1.73_m2} (ref >59)
GFR, EST AFRICAN AMERICAN: 120 mL/min/{1.73_m2} (ref >59)
GLUCOSE: 85 mg/dL (ref 65–99)
Potassium: 3.2 mmol/L — ABNORMAL LOW (ref 3.5–5.2)
Sodium: 139 mmol/L (ref 134–144)

## 2017-04-25 NOTE — Progress Notes (Signed)
Internal Medicine Clinic Attending  Case discussed with Dr. Hoffman at the time of the visit.  We reviewed the resident's history and exam and pertinent patient test results.  I agree with the assessment, diagnosis, and plan of care documented in the resident's note.  

## 2017-04-25 NOTE — Assessment & Plan Note (Signed)
Assessment: Healthcare maintenance Patient due for influenza vaccine. Offered in office, patient declined.

## 2017-04-25 NOTE — Assessment & Plan Note (Addendum)
Assessment: Essential hypertension Patient was started on spironolactone on 03/2017 for long history of hypokalemia and recent diagnosis of  hypertension. Today's blood pressure is 123/83, stable and at goal.  Will get bmet.  Plan -refill spironolactone  -BMET

## 2017-04-25 NOTE — Assessment & Plan Note (Signed)
Assessment: Abnormal Pap smear Patient had a Pap smear in 02/2017 with ASCUS with positive high risk HPV cervical. Referral was made to gynecology and patient has appointment on 12/31 however patient was unaware of appointment.   Called office today however no answer.  Will need to continue to follow up to assure patient has follow up for her pap smear results.    Plan -OBGYN appointment - Nursing staff to continue to call to assure appointment is correct

## 2017-05-08 ENCOUNTER — Ambulatory Visit (HOSPITAL_COMMUNITY): Payer: Self-pay

## 2017-05-12 ENCOUNTER — Ambulatory Visit: Payer: Self-pay | Admitting: Obstetrics and Gynecology

## 2017-05-27 ENCOUNTER — Ambulatory Visit (HOSPITAL_COMMUNITY)
Admission: RE | Admit: 2017-05-27 | Discharge: 2017-05-27 | Disposition: A | Discharge status: Home / Self Care | Payer: Self-pay | Source: Ambulatory Visit | Attending: Obstetrics and Gynecology | Admitting: Obstetrics and Gynecology

## 2017-05-27 ENCOUNTER — Encounter (HOSPITAL_COMMUNITY): Payer: Self-pay

## 2017-05-27 ENCOUNTER — Ambulatory Visit
Admission: RE | Admit: 2017-05-27 | Discharge: 2017-05-27 | Disposition: A | Discharge status: Home / Self Care | Payer: No Typology Code available for payment source | Source: Ambulatory Visit | Attending: Obstetrics and Gynecology | Admitting: Obstetrics and Gynecology

## 2017-05-27 VITALS — BP 118/70 | Ht 62.0 in | Wt 162.0 lb

## 2017-05-27 DIAGNOSIS — R87810 Cervical high risk human papillomavirus (HPV) DNA test positive: Secondary | ICD-10-CM

## 2017-05-27 DIAGNOSIS — Z1231 Encounter for screening mammogram for malignant neoplasm of breast: Secondary | ICD-10-CM

## 2017-05-27 DIAGNOSIS — R87610 Atypical squamous cells of undetermined significance on cytologic smear of cervix (ASC-US): Secondary | ICD-10-CM

## 2017-05-27 DIAGNOSIS — Z1239 Encounter for other screening for malignant neoplasm of breast: Secondary | ICD-10-CM

## 2017-05-27 NOTE — Progress Notes (Signed)
Patient referred to [facility] by [facility] due to having an abnormal Pap smear 03/07/2017 that a colposcopy is recommended for follow-up.  Pap Smear: Pap smear not completed today. Last Pap smear was 03/07/2017 at [facility] and ASCUS with positive HPV. Referred patient to the [facility] [place] [facility] for a colpscopy. Appointment scheduled for Friday, May 30, 2017 at 1415. Per patient has a history of another abnormal Pap smear in 2001 that a colposcopy was completed for follow-up. Last pap smear result is in Epic.  Physical exam: Breasts Breasts symmetrical. No skin abnormalities bilateral breasts. No nipple retraction bilateral breasts. No nipple discharge bilateral breasts. No lymphadenopathy. No lumps palpated bilateral breasts. Complaints of bilateral diffuse breast tenderness during exam. Referred patient to the [facility] for a screening mammogram. Appointment scheduled for Tuesday, May 27, 2017 at 1020.        Pelvic/Bimanual No Pap smear completed today since last Pap smear was 03/07/2017. Pap smear not indicated per BCCCP guidelines.   Smoking History: Patient has never smoked.  Patient Navigation: Patient education provided. Access to services provided for patient through [facility] program.

## 2017-05-27 NOTE — Patient Instructions (Signed)
Explained breast self awareness with Meagan Dean. Patient did not need a Pap smear today due to last Pap smear was 03/07/2017. Explained the colposcopy the recommended follow-up for her abnormal Pap smear. Referred patient to the [facility] for a colpscopy. Appointment scheduled for Friday, May 30, 2017 at 1415. Referred patient to the [facility] for a screening mammogram. Appointment scheduled for Tuesday, May 27, 2017 at 1020. Patient aware of appointments and will be there. Let patient know the [facility] will follow up with her within the next couple weeks with results of mammogram by letter or phone. [name] verbalized understanding.  [place], Arvil Chaco, RN 9:12 AM

## 2017-05-28 ENCOUNTER — Encounter (HOSPITAL_COMMUNITY): Payer: Self-pay | Admitting: *Deleted

## 2017-05-30 ENCOUNTER — Other Ambulatory Visit (HOSPITAL_COMMUNITY)
Admission: RE | Admit: 2017-05-30 | Discharge: 2017-05-30 | Disposition: A | Discharge status: Home / Self Care | Payer: No Typology Code available for payment source | Source: Ambulatory Visit | Attending: Family Medicine | Admitting: Family Medicine

## 2017-05-30 ENCOUNTER — Ambulatory Visit (INDEPENDENT_AMBULATORY_CARE_PROVIDER_SITE_OTHER): Payer: Self-pay | Admitting: Family Medicine

## 2017-05-30 ENCOUNTER — Encounter: Payer: Self-pay | Admitting: Family Medicine

## 2017-05-30 VITALS — BP 127/86 | HR 64 | Ht 62.0 in | Wt 163.1 lb

## 2017-05-30 DIAGNOSIS — R87810 Cervical high risk human papillomavirus (HPV) DNA test positive: Secondary | ICD-10-CM

## 2017-05-30 DIAGNOSIS — Z113 Encounter for screening for infections with a predominantly sexual mode of transmission: Secondary | ICD-10-CM

## 2017-05-30 DIAGNOSIS — R87610 Atypical squamous cells of undetermined significance on cytologic smear of cervix (ASC-US): Secondary | ICD-10-CM

## 2017-05-30 NOTE — Addendum Note (Signed)
Addended by: Bethanne Ginger on: 05/30/2017 03:14 PM   Modules accepted: Orders

## 2017-05-30 NOTE — Patient Instructions (Signed)
Colposcopy, Care After  This sheet gives you information about how to care for yourself after your procedure. Your doctor may also give you more specific instructions. If you have problems or questions, contact your doctor.  What can I expect after the procedure?  If you did not have a tissue sample removed (did not have a biopsy), you may only have some spotting for a few days. You can go back to your normal activities.  If you had a tissue sample removed, it is common to have:  · Soreness and pain. This may last for a few days.  · Light-headedness.  · Mild bleeding from your vagina or dark-colored, grainy discharge from your vagina. This may last for a few days. You may need to wear a sanitary pad.  · Spotting for at least 48 hours after the procedure.    Follow these instructions at home:  · Take over-the-counter and prescription medicines only as told by your doctor. Ask your doctor what medicines you can start taking again. This is very important if you take blood-thinning medicine.  · Do not drive or use heavy machinery while taking prescription pain medicine.  · For 3 days, or as long as your doctor tells you, avoid:  ? Douching.  ? Using tampons.  ? Having sex.  · If you use birth control (contraception), keep using it.  · Limit activity for the first day after the procedure. Ask your doctor what activities are safe for you.  · It is up to you to get the results of your procedure. Ask your doctor when your results will be ready.  · Keep all follow-up visits as told by your doctor. This is important.  Contact a doctor if:  · You get a skin rash.  Get help right away if:  · You are bleeding a lot from your vagina. It is a lot of bleeding if you are using more than one pad an hour for 2 hours in a row.  · You have clumps of blood (blood clots) coming from your vagina.  · You have a fever.  · You have chills  · You have pain in your lower belly (pelvic area).  · You have signs of infection, such as vaginal  discharge that is:  ? Different than usual.  ? Yellow.  ? Bad-smelling.  · You have very pain or cramps in your lower belly that do not get better with medicine.  · You feel light-headed.  · You feel dizzy.  · You pass out (faint).  Summary  · If you did not have a tissue sample removed (did not have a biopsy), you may only have some spotting for a few days. You can go back to your normal activities.  · If you had a tissue sample removed, it is common to have mild pain and spotting for 48 hours.  · For 3 days, or as long as your doctor tells you, avoid douching, using tampons and having sex.  · Get help right away if you have bleeding, very bad pain, or signs of infection.  This information is not intended to replace advice given to you by your health care provider. Make sure you discuss any questions you have with your health care provider.  Document Released: 10/16/2007 Document Revised: 01/17/2016 Document Reviewed: 01/17/2016  Elsevier Interactive Patient Education © 2018 Elsevier Inc.

## 2017-05-30 NOTE — Progress Notes (Signed)
    GYNECOLOGY CLINIC COLPOSCOPY PROCEDURE NOTE  47 y.o. G1P0010 here for colposcopy for ASCUS with POSITIVE high risk HPV pap smear in October 2018. Discussed role for HPV in cervical dysplasia, need for surveillance.  Patient given informed consent, signed copy in the chart, time out was performed.  Placed in lithotomy position. Cervix viewed with speculum and colposcope after application of acetic acid.   Colposcopy adequate? Yes  no visible lesions;  biopsies obtained at 12 and 6 oclock.  ECC specimen obtained. All specimens were labeled and sent to pathology.   Patient was given post procedure instructions.  Will follow up pathology and manage accordingly; patient will be contacted with results and recommendations.  Routine preventative health maintenance measures emphasized.    Dannielle Huh, [place] for [employer], [place]

## 2017-06-03 LAB — GC/CHLAMYDIA PROBE AMP (~~LOC~~) NOT AT ARMC
CHLAMYDIA, DNA PROBE: NEGATIVE
Neisseria Gonorrhea: NEGATIVE

## 2017-06-10 ENCOUNTER — Ambulatory Visit: Payer: Self-pay | Admitting: Obstetrics & Gynecology

## 2017-06-10 ENCOUNTER — Encounter: Payer: Self-pay | Admitting: *Deleted

## 2017-06-10 ENCOUNTER — Encounter: Payer: Self-pay | Admitting: Obstetrics & Gynecology

## 2017-06-10 VITALS — BP 124/90 | HR 68 | Wt 167.5 lb

## 2017-06-10 DIAGNOSIS — N809 Endometriosis, unspecified: Secondary | ICD-10-CM

## 2017-06-10 DIAGNOSIS — D219 Benign neoplasm of connective and other soft tissue, unspecified: Secondary | ICD-10-CM

## 2017-06-10 DIAGNOSIS — R102 Pelvic and perineal pain: Secondary | ICD-10-CM

## 2017-06-10 DIAGNOSIS — G8929 Other chronic pain: Secondary | ICD-10-CM

## 2017-06-10 MED ORDER — NORGESTIMATE-ETH ESTRADIOL 0.25-35 MG-MCG PO TABS
1.0000 | ORAL_TABLET | Freq: Every day | ORAL | 11 refills | Status: DC
Start: 2017-06-10 — End: 2017-09-02

## 2017-06-10 NOTE — Progress Notes (Signed)
History:  47 y.o. G1P0010 here today for eval of uterine fibroids.  LMP Mar 2018 Pt reports pain in her pelvis. She reports that since Feb or March she has had vaginal pain and back pain.  She has a h/o uterine fibroids and is s/p a myomectomy.    Pt has a h/o hematuria. Has been to her primary care provider for eval. Recently has a colpo for abnormal PAP. She feel like there is more pain since the biopsy. Pt did have pain with intercourse when she was sexually active, She is no longer sexually active.   Pt has been dx'd with endometriosis. She has a h/o nephrolithiasis with stent placement. She reports pelvic pain on both sides and rectal pain.   Myomectomy 2004 Ruptured appy 2016 Lap band 2010  Exp Lap with bowel obstruction 2017   The following portions of the patient's history were reviewed and updated as appropriate: allergies, current medications, past family history, past medical history, past social history, past surgical history and problem list.  Review of Systems:  Pertinent items are noted in HPI.   Objective:  Physical Exam Blood pressure 124/90, pulse 68, weight 167 lb 8 oz (76 kg), last menstrual period 07/10/2016.  CONSTITUTIONAL: Well-developed, well-nourished female in no acute distress.  HENT:  Normocephalic, atraumatic EYES: Conjunctivae and EOM are normal. No scleral icterus.  NECK: Normal range of motion SKIN: Skin is warm and dry. No rash noted. Not diaphoretic.No pallor. [place]: Alert and oriented to person, place, and time. Normal coordination Lungs: CTA CV: RRR.  Abd: Soft, diffusely tender but, nondistended Pelvic: Normal appearing external genitalia; normal appearing vaginal mucosa and cervix.  Normal discharge.  The uterus is difficult to palpate due to pts body habitus and tenderness however, it does not appear to be appreciably enlarge. No other palpable masses, no uterine or adnexal tenderness  Labs and Imaging  03/21/2017 CLINICAL DATA:  Bilateral  flank pain  EXAM: CT ABDOMEN AND PELVIS WITHOUT CONTRAST  TECHNIQUE: Multidetector CT imaging of the abdomen and pelvis was performed following the standard protocol without IV contrast.  COMPARISON:  08/30/2016, 07/14/2016, 11/21/2015  FINDINGS: Lower chest: Lung bases demonstrate no acute consolidation or effusion. Normal heart size  Hepatobiliary: No focal liver abnormality is seen. No gallstones, gallbladder wall thickening, or biliary dilatation.  Pancreas: Unremarkable. No pancreatic ductal dilatation or surrounding inflammatory changes.  Spleen: Normal in size without focal abnormality.  Adrenals/Urinary Tract: Adrenal glands are unremarkable. Kidneys are normal, without renal calculi, focal lesion, or hydronephrosis. Bladder is unremarkable.  Stomach/Bowel: Status post gastric banding without interval change. Distension of the distal esophagus with small amount of fluid and mild wall thickening. No evidence for a small bowel obstruction. Status post appendectomy. No colon wall thickening.  Vascular/Lymphatic: No significant vascular findings are present. No enlarged abdominal or pelvic lymph nodes.  Reproductive: Lobulated uterus compatible with fibroids. No adnexal mass  Other: No free air or free fluid. Re- demonstrated umbilical/periumbilical hernia containing small bowel.  Musculoskeletal: No acute or significant osseous findings.  IMPRESSION: 1. Negative for nephrolithiasis, hydronephrosis, or ureteral stone 2. Status post gastric banding. Mildly dilated distal esophagus with fluid and mild wall thickening. 3. No change in small umbilical/periumbilical hernia containing small bowel. Negative for an obstruction.  Assessment & Plan:  Pelvic pain- I suspect that this is due to pts endometriosis and not the uterine fibroids. Will obtain an [place] to get a clearer pic of the uterus.   Pelvic US Sprintec 1 po q day-  no contraindications to  OCPs  Rec f/u in 1-2 weeks after [place]  Total face-to-face time with patient was 20 min.  Greater than 50% was spent in counseling and coordination of care with the patient.   Meagan Dean, M.D., [place]

## 2017-06-12 ENCOUNTER — Ambulatory Visit (HOSPITAL_COMMUNITY)
Admission: RE | Admit: 2017-06-12 | Discharge: 2017-06-12 | Disposition: A | Discharge status: Home / Self Care | Payer: Self-pay | Source: Ambulatory Visit | Attending: Obstetrics & Gynecology | Admitting: Obstetrics & Gynecology

## 2017-06-12 DIAGNOSIS — D219 Benign neoplasm of connective and other soft tissue, unspecified: Secondary | ICD-10-CM

## 2017-06-12 DIAGNOSIS — D259 Leiomyoma of uterus, unspecified: Secondary | ICD-10-CM | POA: Insufficient documentation

## 2017-06-12 DIAGNOSIS — N854 Malposition of uterus: Secondary | ICD-10-CM | POA: Insufficient documentation

## 2017-06-16 ENCOUNTER — Encounter: Payer: Self-pay | Admitting: Obstetrics & Gynecology

## 2017-06-16 ENCOUNTER — Ambulatory Visit (INDEPENDENT_AMBULATORY_CARE_PROVIDER_SITE_OTHER): Payer: Self-pay | Admitting: Obstetrics & Gynecology

## 2017-06-16 VITALS — BP 133/87 | HR 75 | Wt 164.1 lb

## 2017-06-16 DIAGNOSIS — D219 Benign neoplasm of connective and other soft tissue, unspecified: Secondary | ICD-10-CM

## 2017-06-16 DIAGNOSIS — Z3202 Encounter for pregnancy test, result negative: Secondary | ICD-10-CM

## 2017-06-16 DIAGNOSIS — R102 Pelvic and perineal pain: Secondary | ICD-10-CM

## 2017-06-16 DIAGNOSIS — N951 Menopausal and female climacteric states: Secondary | ICD-10-CM

## 2017-06-16 LAB — POCT PREGNANCY, URINE: Preg Test, Ur: NEGATIVE

## 2017-06-16 NOTE — Patient Instructions (Signed)
Hysterectomy Information A hysterectomy is a surgery to remove your uterus. After surgery, you will no longer have periods. Also, you will not be able to get pregnant. Reasons for this surgery  You have bleeding that is not normal and keeps coming back.  You have lasting (chronic) lower belly (pelvic) pain.  You have a lasting infection.  The lining of your uterus grows outside your uterus.  The lining of your uterus grows in the muscle of your uterus.  Your uterus falls down into your vagina.  You have a growth in your uterus that causes problems.  You have cells that could turn into cancer (precancerous cells).  You have cancer of the uterus or cervix. Types There are 3 types of hysterectomies. Depending on the type, the surgery will:  Remove the top part of the uterus only.  Remove the uterus and the cervix.  Remove the uterus, cervix, and tissue that holds the uterus in place in the lower belly.  Ways a hysterectomy can be performed There are 5 ways this surgery can be performed.  A cut (incision) is made in the belly (abdomen). The uterus is taken out through the cut.  A cut is made in the vagina. The uterus is taken out through the cut.  Three or four cuts are made in the belly. A surgical device with a camera is put through one of the cuts. The uterus is cut into small pieces. The uterus is taken out through the cuts or the vagina.  Three or four cuts are made in the belly. A surgical device with a camera is put through one of the cuts. The uterus is taken out through the vagina.  Three or four cuts are made in the belly. A surgical device that is controlled by a computer makes a visual image. The device helps the surgeon control the surgical tools. The uterus is cut into small pieces. The pieces are taken out through the cuts or through the vagina.  What can I expect after the surgery?  You will be given pain medicine.  You will need help at home for 3-5 days  after surgery.  You will need to see your doctor in 2-4 weeks after surgery.  You may get hot flashes, have night sweats, and have trouble sleeping.  You may need to have Pap tests in the future if your surgery was related to cancer. Talk to your doctor. It is still good to have regular exams. This information is not intended to replace advice given to you by your health care provider. Make sure you discuss any questions you have with your health care provider. Document Released: 07/22/2011 Document Revised: 10/05/2015 Document Reviewed: 01/04/2013 Elsevier Interactive Patient Education  2018 Elsevier Inc. Abdominal Hysterectomy, Care After This sheet gives you information about how to care for yourself after your procedure. Your doctor may also give you more specific instructions. If you have problems or questions, contact your doctor. Follow these instructions at home: Bathing  Do not take baths, swim, or use a hot tub until your doctor says it is okay. Ask your doctor if you can take showers. You may only be allowed to take sponge baths for bathing.  Keep the bandage (dressing) dry until your doctor says it can be taken off. Surgical cut ( incision) care  Follow instructions from your doctor about how to take care of your cut from surgery. Make sure you: ? Wash your hands with soap and water before you change   your bandage (dressing). If you cannot use soap and water, use hand sanitizer. ? Change your bandage as told by your doctor. ? Leave stitches (sutures), skin glue, or skin tape (adhesive) strips in place. They may need to stay in place for 2 weeks or longer. If tape strips get loose and curl up, you may trim the loose edges. Do not remove tape strips completely unless your doctor says it is okay.  Check your surgical cut area every day for signs of infection. Check for: ? Redness, swelling, or pain. ? Fluid or blood. ? Warmth. ? Pus or a bad smell. Activity  Do gentle, daily  exercise as told by your doctor. You may be told to take short walks every day and go farther each time.  Do not lift anything that is heavier than 10 lb (4.5 kg), or the limit that your doctor tells you, until he or she says that it is safe.  Do not drive or use heavy machinery while taking prescription pain medicine.  Do not drive for 24 hours if you were given a medicine to help you relax (sedative).  Follow your doctor's advice about exercise, driving, and general activities. Ask your doctor what activities are safe for you. Lifestyle  Do not douche, use tampons, or have sex for at least 6 weeks or as told by your doctor.  Do not drink alcohol until your doctor says it is okay.  Drink enough fluid to keep your pee (urine) clear or pale yellow.  Try to have someone at home with you for the first 1-2 weeks to help.  Do not use any products that contain nicotine or tobacco, such as cigarettes and e-cigarettes. These can slow down healing. If you need help quitting, ask your doctor. General instructions  Take over-the-counter and prescription medicines only as told by your doctor.  Do not take aspirin or ibuprofen. These medicines can cause bleeding.  To prevent or treat constipation while you are taking prescription pain medicine, your doctor may suggest that you: ? Drink enough fluid to keep your urine clear or pale yellow. ? Take over-the-counter or prescription medicines. ? Eat foods that are high in fiber, such as:  Fresh fruits and vegetables.  Whole grains.  Beans. ? Limit foods that are high in fat and processed sugars, such as fried and sweet foods.  Keep all follow-up visits as told by your doctor. This is important. Contact a doctor if:  You have chills or fever.  You have redness, swelling, or pain around your cut.  You have fluid or blood coming from your cut.  Your cut feels warm to the touch.  You have pus or a bad smell coming from your cut.  Your  cut breaks open.  You feel dizzy or light-headed.  You have pain or bleeding when you pee.  You keep having watery poop (diarrhea).  You keep feeling sick to your stomach (nauseous) or keep throwing up (vomiting).  You have unusual fluid (discharge) coming from your vagina.  You have a rash.  You have a reaction to your medicine.  Your pain medicine does not help. Get help right away if:  You have a fever and your symptoms get worse all of a sudden.  You have very bad belly (abdominal) pain.  You are short of breath.  You pass out (faint).  You have pain, swelling, or redness of your leg.  You bleed a lot from your vagina and notice clumps of blood (  clots). Summary  Do not take baths, swim, or use a hot tub until your doctor says it is okay. Ask your doctor if you can take showers. You may only be allowed to take sponge baths for bathing.  Follow your doctor's advice about exercise, driving, and general activities. Ask your doctor what activities are safe for you.  Do not lift anything that is heavier than 10 lb (4.5 kg), or the limit that your doctor tells you, until he or she says that it is safe.  Try to have someone at home with you for the first 1-2 weeks to help. This information is not intended to replace advice given to you by your health care provider. Make sure you discuss any questions you have with your health care provider. Document Released: 02/06/2008 Document Revised: 04/17/2016 Document Reviewed: 04/17/2016 Elsevier Interactive Patient Education  2017 Elsevier Inc.  

## 2017-06-16 NOTE — Addendum Note (Signed)
Addended by: Riccardo Dubin on: 06/16/2017 05:04 PM   Modules accepted: Orders

## 2017-06-16 NOTE — Progress Notes (Signed)
History:  47 y.o. G1P0010 here today for f/u of pelvic pain and pressure. Pts LMP was almost 1 year prev.  She reports that she is taking the OCPs but, she gets bloating and she does not like the way that it makes her feel. She does not want other tx for the fibroids due to her prior myomectomy. Pt would like definitve treatment.  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   The following portions of the patient's history were reviewed and updated as appropriate: allergies, current medications, past family history, past medical history, past social history, past surgical history and problem list.  Review of Systems:  Pertinent items are noted in HPI.   Objective:  Physical Exam Blood pressure 133/87, pulse 75, weight 164 lb 1.6 oz (74.4 kg). Gen: NAD Abd: Soft, nontender and nondistended Pelvic: Normal appearing external genitalia; normal appearing vaginal mucosa and cervix.  Normal discharge.  Small uterus, no other palpable masses, no uterine or adnexal tenderness  Labs and Imaging US Pelvis Transvanginal Non-ob (tv Only)  Result Date: 06/12/2017 CLINICAL DATA:  Chronic pelvic pain. History of fibroids, endometriosis. History of myomectomy and ovarian cystectomy. EXAM: TRANSABDOMINAL AND TRANSVAGINAL ULTRASOUND OF PELVIS TECHNIQUE: Both transabdominal and transvaginal ultrasound examinations of the pelvis were performed. Transabdominal technique was performed for global imaging of the pelvis including uterus, ovaries, adnexal regions, and pelvic cul-de-sac. It was necessary to proceed with endovaginal exam following the transabdominal exam to visualize the uterus, endometrium, ovaries, adnexal regions. COMPARISON:  CT 03/21/2017 FINDINGS: Uterus Measurements: 6.7 x 4.8 x 4.7 centimeters. Uterus is retroverted. At least 3 discrete fibroids are identified. Posterior left uterine fibroid is 2.9 x 2.6 x 2.5 centimeters. Posterior fundal myometrial  fibroid is 1.1 x 0.9 x 0.7 centimeters. Anterior fundal fibroid is 1.4 x 1.3 x 1.1 centimeters. Endometrium Thickness: 5.9 millimeter.  Visualized portion is unremarkable. Right ovary Measurements: 2.9 x 1.4 x 1.4 centimeters. Normal appearance/no adnexal mass. Left ovary Measurements: 2.5 x 1.8 x 1.4 centimeter. Normal appearance/no adnexal mass. Other findings No abnormal free fluid. IMPRESSION: 1. Retroverted uterus with multiple fibroids, largest measuring 2.9 centimeters. 2. Normal appearance of the endometrium. 3. Normal appearance of both ovaries. Electronically Signed   By: Nolon Nations M.D.   On: 06/12/2017 13:56   US Pelvis Complete  Result Date: 06/12/2017 CLINICAL DATA:  Chronic pelvic pain. History of fibroids, endometriosis. History of myomectomy and ovarian cystectomy. EXAM: TRANSABDOMINAL AND TRANSVAGINAL ULTRASOUND OF PELVIS TECHNIQUE: Both transabdominal and transvaginal ultrasound examinations of the pelvis were performed. Transabdominal technique was performed for global imaging of the pelvis including uterus, ovaries, adnexal regions, and pelvic cul-de-sac. It was necessary to proceed with endovaginal exam following the transabdominal exam to visualize the uterus, endometrium, ovaries, adnexal regions. COMPARISON:  CT 03/21/2017 FINDINGS: Uterus Measurements: 6.7 x 4.8 x 4.7 centimeters. Uterus is retroverted. At least 3 discrete fibroids are identified. Posterior left uterine fibroid is 2.9 x 2.6 x 2.5 centimeters. Posterior fundal myometrial fibroid is 1.1 x 0.9 x 0.7 centimeters. Anterior fundal fibroid is 1.4 x 1.3 x 1.1 centimeters. Endometrium Thickness: 5.9 millimeter.  Visualized portion is unremarkable. Right ovary Measurements: 2.9 x 1.4 x 1.4 centimeters. Normal appearance/no adnexal mass. Left ovary Measurements: 2.5 x 1.8 x 1.4 centimeter. Normal appearance/no adnexal mass. Other findings No abnormal free fluid. IMPRESSION: 1. Retroverted uterus with multiple fibroids,  largest measuring 2.9 centimeters. 2. Normal appearance of the endometrium. 3. Normal  appearance of both ovaries. Electronically Signed   By: Nolon Nations M.D.   On: 06/12/2017 13:56   Ms Digital Screening Bilateral  Result Date: 05/27/2017 CLINICAL DATA:  Screening. EXAM: DIGITAL SCREENING BILATERAL MAMMOGRAM WITH CAD COMPARISON:  None. ACR Breast Density Category a: The breast tissue is almost entirely fatty. FINDINGS: There are no findings suspicious for malignancy. Images were processed with CAD. IMPRESSION: No mammographic evidence of malignancy. A result letter of this screening mammogram will be mailed directly to the patient. RECOMMENDATION: Screening mammogram in one year. (Code:SM-B-01Y) BI-RADS CATEGORY  1: Negative. Electronically Signed   By: Nolon Nations M.D.   On: 05/27/2017 11:04    Assessment & Plan:  Pelvic pain and pressure. LMP ~ 1 year prev.  Pt with uterine fibroids and a h/o endometriosis. Pt with a h/o multiple prior surgeries. I have discussed with the pt conservative options including meds and a [place]. Pt declines these options.   Patient desires surgical management with TAH with bilateral salpingectomy.  The risks of surgery were discussed in detail with the patient including but not limited to: bleeding which may require transfusion or reoperation; infection which may require prolonged hospitalization or re-hospitalization and antibiotic therapy; injury to bowel, bladder, ureters and major vessels or other surrounding organs; need for additional procedures including laparotomy; thromboembolic phenomenon, incisional problems and other postoperative or anesthesia complications.  Patient was told that the likelihood that her condition and symptoms will be treated effectively with this surgical management was very high; the postoperative expectations were also discussed in detail. The patient also understands the alternative treatment options which were discussed in full. All  questions were answered.  She was told that she will be contacted by our surgical scheduler regarding the time and date of her surgery; routine preoperative instructions of having nothing to eat or drink after midnight on the day prior to surgery and also coming to the hospital 1 1/2 hours prior to her time of surgery were also emphasized.  She was told she may be called for a preoperative appointment about a week prior to surgery and will be given further preoperative instructions at that visit. Printed patient education handouts about the procedure were given to the patient to review at home.  Op notes from 11/24/2015 reviewed. I explained to her that given her prev adhesions there is an increased risk of problems with adhesions including bowel injury. Pt is aware and expressed understanding.   Total face-to-face time with patient was 20 min.  Greater than 50% was spent in counseling and coordination of care with the patient.    Aquila Menzie L. Harraway-Smith, M.D., [place]

## 2017-06-17 ENCOUNTER — Encounter (HOSPITAL_COMMUNITY): Payer: Self-pay

## 2017-06-18 ENCOUNTER — Other Ambulatory Visit: Payer: Self-pay | Admitting: Obstetrics & Gynecology

## 2017-06-18 ENCOUNTER — Telehealth: Payer: Self-pay | Admitting: *Deleted

## 2017-06-18 MED ORDER — DICLOFENAC POTASSIUM 50 MG PO TABS: 50.0000 mg | ORAL_TABLET | Freq: Three times a day (TID) | ORAL | 1 refills | Status: DC

## 2017-06-18 MED ORDER — TRAMADOL HCL 50 MG PO TABS: 50.0000 mg | ORAL_TABLET | Freq: Four times a day (QID) | ORAL | 0 refills | Status: DC | PRN

## 2017-06-18 NOTE — Telephone Encounter (Signed)
Spoke with Dr. Ihor Dow via phone.  States she will order pain medication for patient.  Requests I return call to patient to make her aware.  Also requests I tell patient to stop taking any motrin or diclofenac two weeks prior to surgery.  Surgery is scheduled for 09/02/17.    Spoke with patient via phone.  Explained Dr. Ihor Dow is calling pain medication in to her pharmacy.  Explained her surgery is scheduled for 09/02/17 and that she needs to stop Motrin or diclofenac two weeks prior to surgery.  Patient states understanding.

## 2017-06-18 NOTE — Telephone Encounter (Signed)
Received call left on nurse voicemail on 06/18/17 at 1513.  Patient states she was here today and spoke with "Dr. Thompson Caul" nurse.  States she needs pain medication.  States the person mentioned they were going to talk to "Dr. Tamala Julian" about possibly giving her toradol.  I contacted the [facility] office and left a message with Ezzard Flax to have Dr. Ihor Dow call me before she leaves the office.

## 2017-06-27 ENCOUNTER — Encounter: Payer: No Typology Code available for payment source | Admitting: Obstetrics and Gynecology

## 2017-06-27 ENCOUNTER — Telehealth (HOSPITAL_COMMUNITY): Payer: Self-pay

## 2017-06-27 NOTE — Telephone Encounter (Signed)
Called and spoke w/ Meagan Dean. Informed her that Dr. Ihor Dow had a cancellation for an earlier surgery date and wanted to see if she would like to move up. Patient declined wanted to stay on April 23rd date.

## 2017-07-08 ENCOUNTER — Telehealth: Payer: Self-pay | Admitting: General Practice

## 2017-07-08 DIAGNOSIS — B379 Candidiasis, unspecified: Secondary | ICD-10-CM

## 2017-07-08 MED ORDER — FLUCONAZOLE 150 MG PO TABS: 150.0000 mg | ORAL_TABLET | Freq: Once | ORAL | 0 refills | Status: AC

## 2017-07-08 NOTE — Telephone Encounter (Signed)
Patient called and left message on nurse line stating she thinks she has a yeast infection. Patient states she was tested previously for it but never heard back about the results. Patient states she is having a discharge and is certain that is what it is. Patient would like something sent in.  Called patient stating I am trying to reach her to return her phone call and asked what symptoms she is having. Patient reports thick white clumpy discharge with itching. Informed patient I will send in a Rx for a yeast infection to her pharmacy. Patient verbalized understanding and had no questions

## 2017-07-24 ENCOUNTER — Encounter: Payer: Self-pay | Admitting: Obstetrics & Gynecology

## 2017-07-24 NOTE — Progress Notes (Signed)
Pt didn't need to see Dr.

## 2017-08-14 IMAGING — CT CT ABD-PELV W/ CM
2 of 5 series · 9 of 46 positions shown, 10 images · IV contrast (Iodine)
Comparison: 08/01/2014

CLINICAL DATA: Possible partial small bowel obstruction

EXAM:
CT ABDOMEN AND PELVIS WITH CONTRAST
TECHNIQUE: Multidetector CT imaging of the abdomen and pelvis was performed
using the standard protocol following bolus administration of
intravenous contrast.
CONTRAST:  100mL [id number] IOPAMIDOL ([id number]) INJECTION 61%

[Series 201: routine, idose (2) · axial · 0.78mm/px · z∈[-121,+269]mm · 6 of 102 slices shown, 7 images]
[im 12/102  soft-tissue]
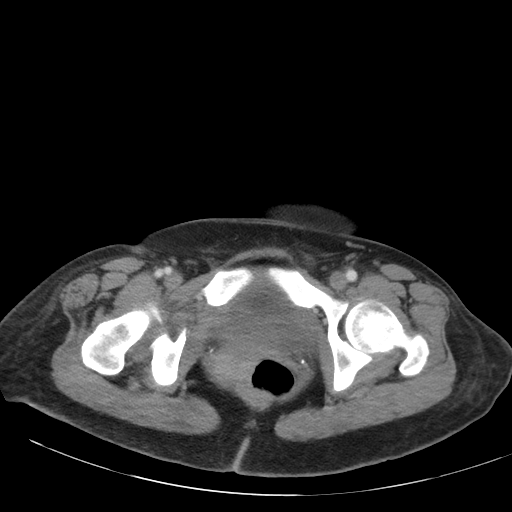
[im 12/102  bone]
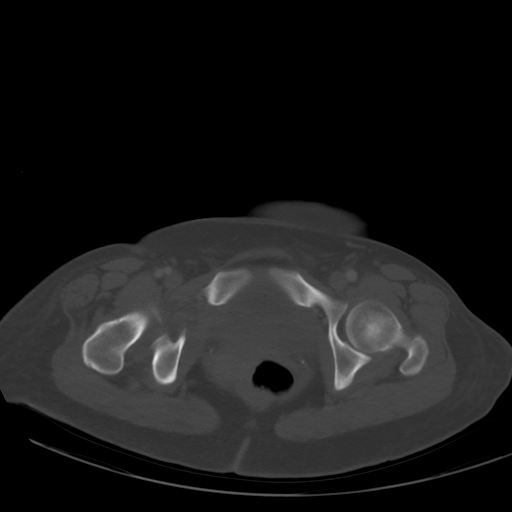
[im 29/102  soft-tissue]
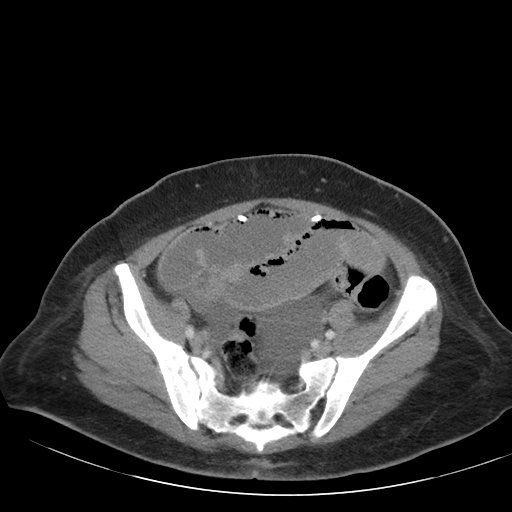
[im 45/102  soft-tissue]
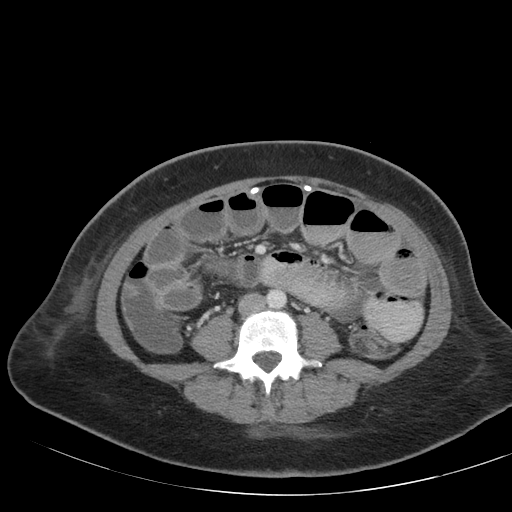
[im 57/102  soft-tissue]
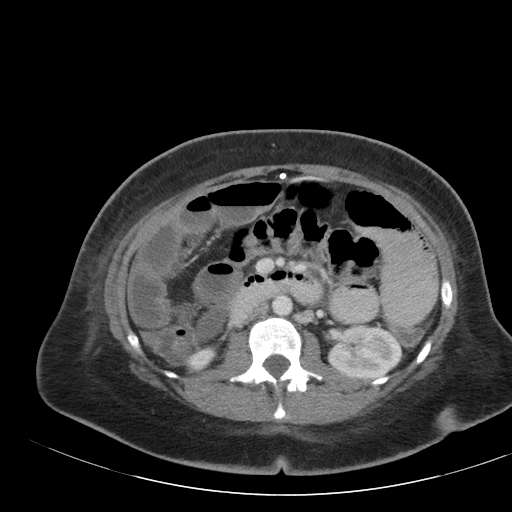
[im 73/102  soft-tissue]
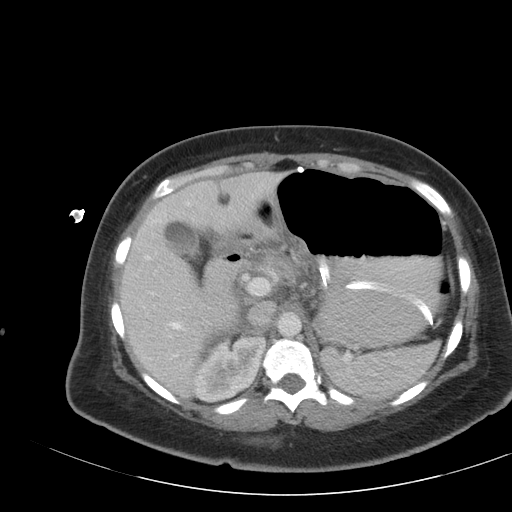
[im 90/102  soft-tissue]
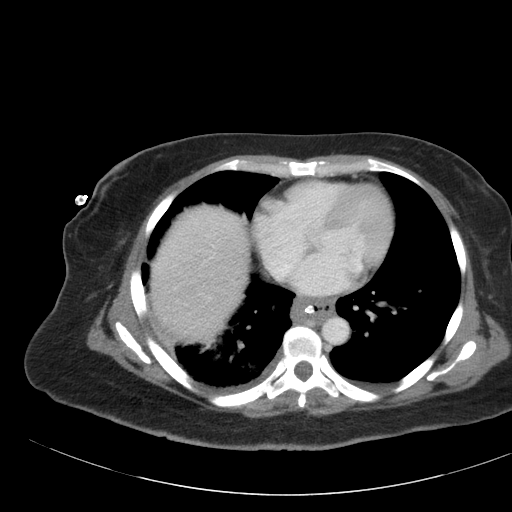

[Series 203: coronals, idose (2) · coronal · 0.45mm/px · 3 of 121 slices shown]
[im 41/121  soft-tissue]
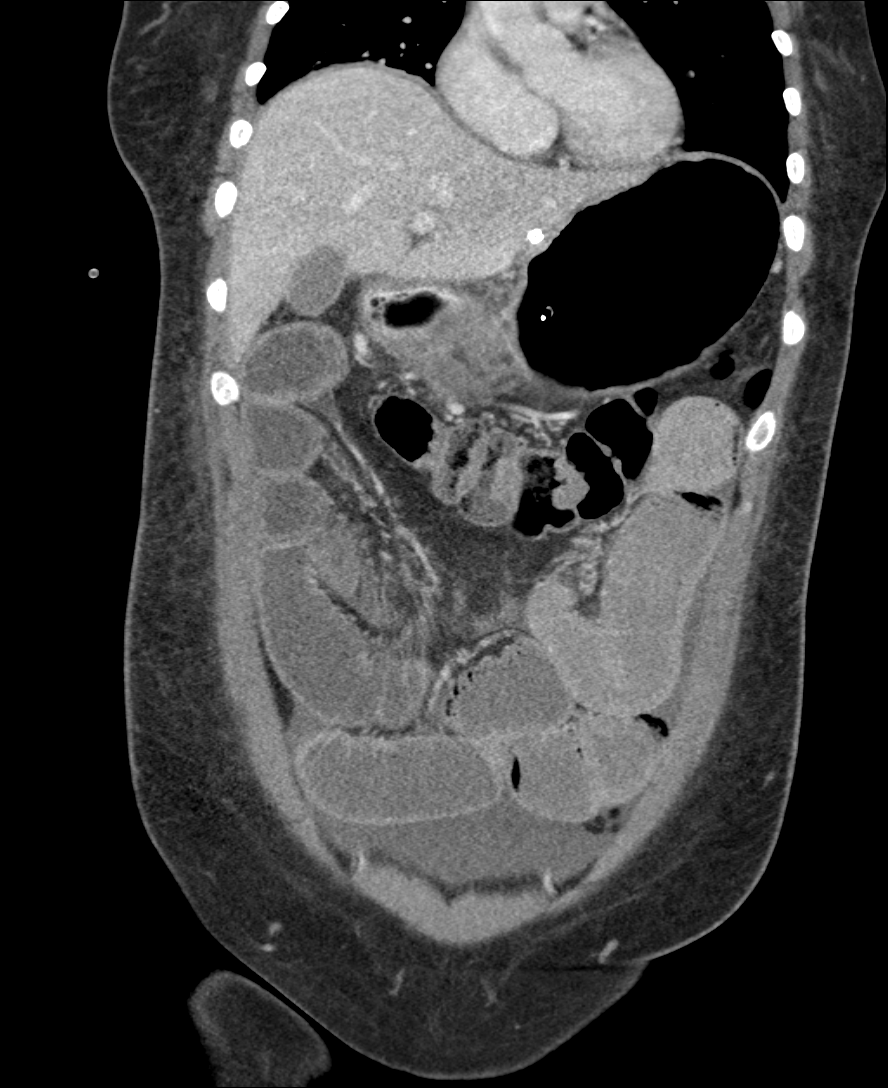
[im 54/121  soft-tissue]
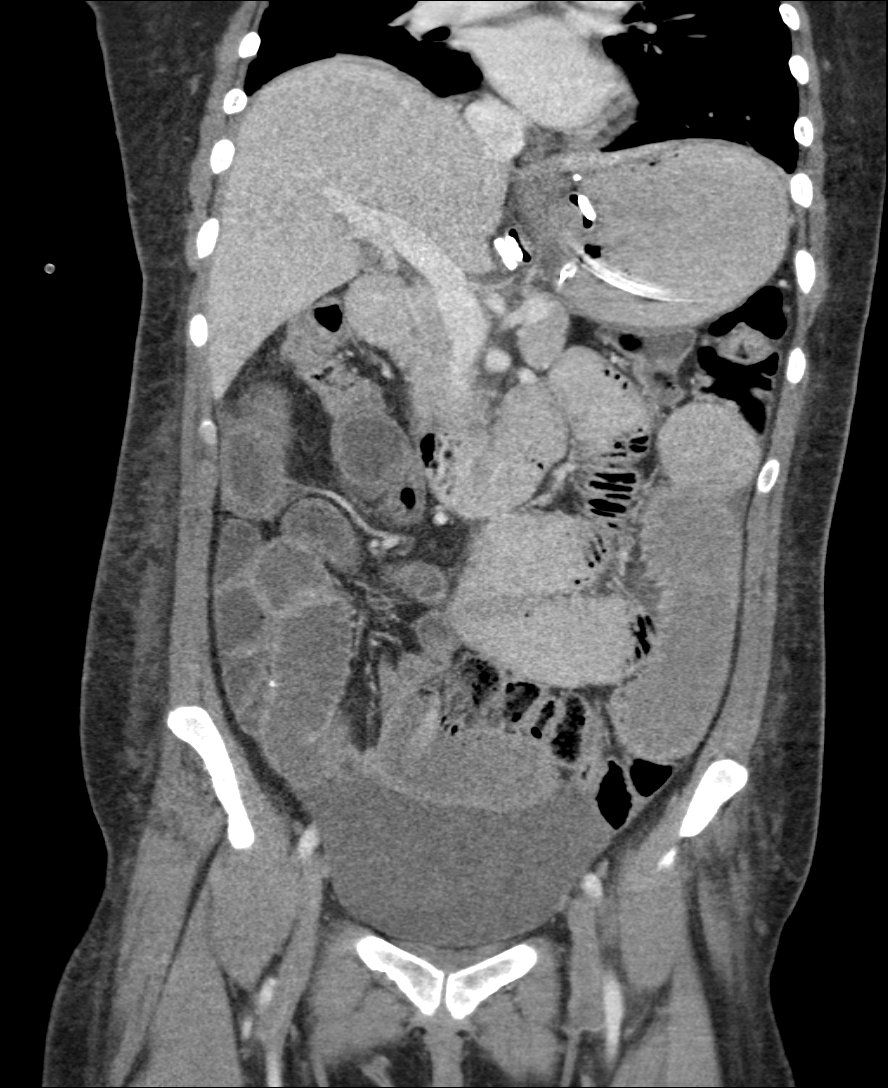
[im 67/121  soft-tissue]
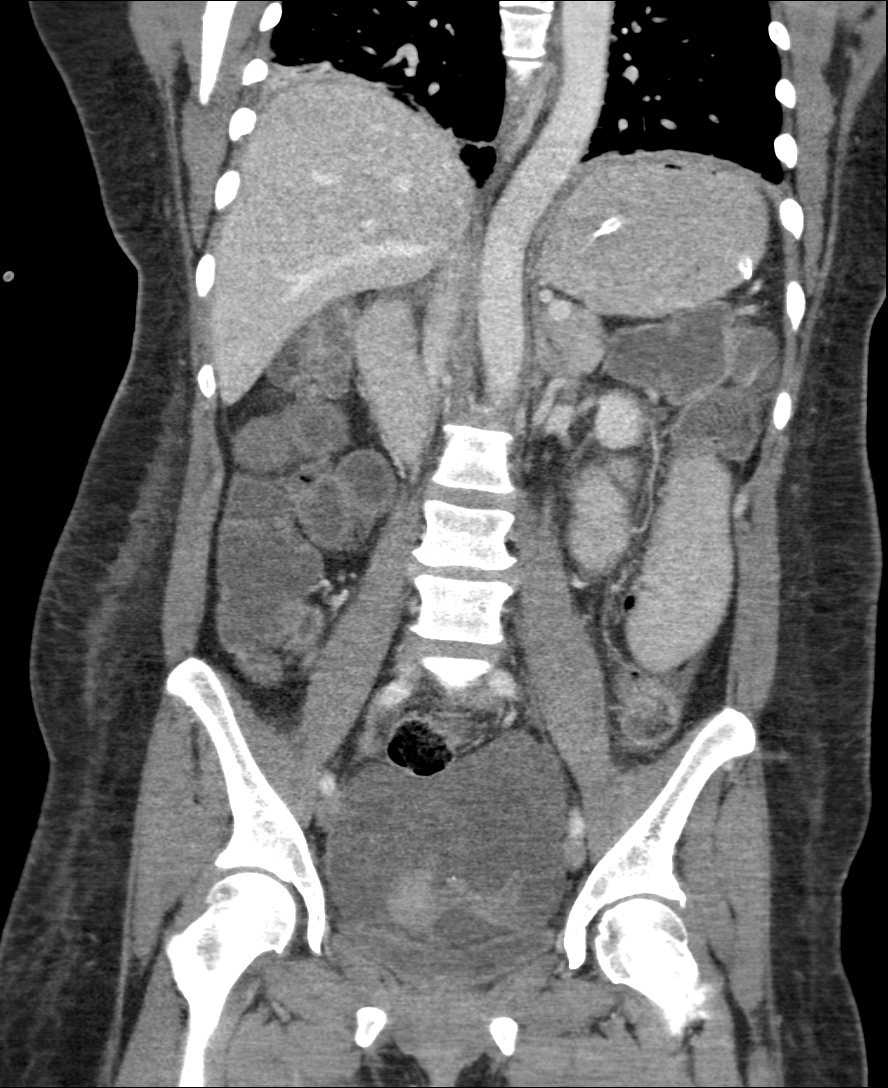

[9 of 46 positions shown; findings below may reference images not displayed]

FINDINGS: Lower chest: The lung bases shows small bilateral pleural effusion.
There is small atelectasis or infiltrate in right base posteriorly.
Mild thickening of distal esophageal wall. There is NG tube in place
with tip within mid stomach.

Hepatobiliary: Enhanced liver shows no biliary ductal dilatation.
Small perihepatic ascites. Gallbladder is contracted without
evidence of calcified gallstones.

Pancreas: Enhanced pancreas is unremarkable. No evidence of
pancreatitis.

Spleen: Enhanced spleen is unremarkable.

Adrenals/Urinary Tract: No adrenal gland mass. Enhanced kidneys are
symmetrical in size. No hydronephrosis or hydroureter. Delayed renal
images shows bilateral renal symmetrical excretion. Bilateral
visualized proximal ureter is unremarkable.

Stomach/Bowel: There is moderate gastric distension with gas and
some oral contrast material. No evidence of gastric outlet
obstruction. There is a gastric band noted in proximal stomach. Oral
contrast material noted within proximal small bowel loops. There is
moderate small bowel distension with multiple air-fluid levels.
Fluid distended mid small bowel loops are noted with air-fluid
levels. In axial image 62 there is transition point in caliber of
small bowel. Findings are highly suspicious for small bowel
obstruction. Most distal terminal small bowel is small caliber
decompressed. Subtle mild thickening of small bowel wall in distal
small bowel in right lower quadrant please see axial image 58. Mild
small bowel wall edema cannot be excluded. Some liquid stool noted
in right colon.

Vascular/Lymphatic: No retroperitoneal or mesenteric adenopathy. No
aortic aneurysm.

Reproductive: Mild anteflexed uterus. Question anterior wall fibroid
measures 2.4 cm.

Other: Abundant pelvic ascites is noted. No adnexal masses noted.
There is small umbilical hernia containing omental fat without
evidence of acute complication measures 3 cm by 1.3 cm. Please see
sagittal image 84. No free abdominal air.

Musculoskeletal: No destructive bony lesions are noted. Minimal
degenerative changes thoracolumbar spine.
IMPRESSION: 1. There is moderate gastric distension with oral contrast material
and gas without evidence of gastric outlet obstruction. NG tube in
place. Gastric lap band in place.
2. There are distended small bowel loops with air-fluid levels.
There is transition in caliber of small bowel in mid lower abdomen
axial image 62. Distal small bowel loops are decompressed small
caliber. Findings are highly suspicious for small bowel obstruction.
No any contrast material noted within distal small bowel or within
colon.
3. There is no evidence of free abdominal air. Abundant pelvic
ascites.
4. Subtle mild thickening of the wall of distal small bowel loops in
right lower abdomen mild small bowel wall edema cannot be excluded.
5. Normal size anteflexed uterus. Question anterior wall uterine
fibroid measures 2.4 cm please see sagittal image 73. Further
correlation with pelvic ultrasound and GYN exam could be performed.

## 2017-08-22 ENCOUNTER — Encounter: Payer: Self-pay | Admitting: General Practice

## 2017-08-22 NOTE — Progress Notes (Signed)
Called [employer] for prior auth for upcoming surgery on 4/23. Prior auth pending review. Reference # [id number]

## 2017-08-26 NOTE — Patient Instructions (Addendum)
Your procedure is scheduled on: Tuesday September 02, 2017 at 2:40 PM  Enter through the Main Entrance of [facility] at: 12:20 PM  Pick up the phone at the desk and dial [phone].  Call this number if you have problems the morning of surgery: [phone].  Remember: Do NOT eat food: after Midnight on Monday April 22 Do NOT drink clear liquids after: 8:00 am Take these medicines the morning of surgery with a SIP OF WATER: ZYRTEC, CAN TAKE SUDAFED DICLOFENAC, BENADRYL IF NEEDED  STOP ALL VITAMINS AND SUPPLEMENTS 1 WEEK PRIOR TO SURGERY  DO NOT SMOKE DAY OF SURGERY  Do NOT wear jewelry (body piercing), metal hair clips/bobby pins, make-up, or nail polish. Do NOT wear lotions, powders, or perfumes.  You may wear deoderant. Do NOT shave for 48 hours prior to surgery. Do NOT bring valuables to the hospital. Contacts, dentures, or bridgework may not be worn into surgery. Leave suitcase in car.  After surgery it may be brought to your room.    For patients admitted to the hospital, checkout time is 11:00 AM the day of discharge.

## 2017-08-27 ENCOUNTER — Encounter (HOSPITAL_COMMUNITY)
Admission: RE | Admit: 2017-08-27 | Discharge: 2017-08-27 | Disposition: A | Discharge status: Home / Self Care | Payer: BLUE CROSS/BLUE SHIELD | Source: Ambulatory Visit | Attending: Obstetrics & Gynecology | Admitting: Obstetrics & Gynecology

## 2017-08-27 ENCOUNTER — Other Ambulatory Visit: Payer: Self-pay

## 2017-08-27 ENCOUNTER — Encounter (HOSPITAL_COMMUNITY): Payer: Self-pay

## 2017-08-27 DIAGNOSIS — Z01812 Encounter for preprocedural laboratory examination: Secondary | ICD-10-CM | POA: Diagnosis not present

## 2017-08-27 HISTORY — DX: Sleep apnea, unspecified: G47.30

## 2017-08-27 HISTORY — DX: Anemia, unspecified: D64.9

## 2017-08-27 HISTORY — DX: Headache, unspecified: R51.9

## 2017-08-27 HISTORY — DX: Headache: R51

## 2017-08-27 HISTORY — DX: Hypokalemia: E87.6

## 2017-08-27 LAB — CBC
HEMATOCRIT: 35.2 % — AB (ref 36.0–46.0)
Hemoglobin: 11.8 g/dL — ABNORMAL LOW (ref 12.0–15.0)
MCH: 28 pg (ref 26.0–34.0)
MCHC: 33.5 g/dL (ref 30.0–36.0)
MCV: 83.6 fL (ref 78.0–100.0)
Platelets: 261 10*3/uL (ref 150–400)
RBC: 4.21 MIL/uL (ref 3.87–5.11)
RDW: 14 % (ref 11.5–15.5)
WBC: 5.1 10*3/uL (ref 4.0–10.5)

## 2017-08-27 LAB — COMPREHENSIVE METABOLIC PANEL
ALT: 13 U/L — ABNORMAL LOW (ref 14–54)
ANION GAP: 10 (ref 5–15)
AST: 21 U/L (ref 15–41)
Albumin: 3.8 g/dL (ref 3.5–5.0)
Alkaline Phosphatase: 57 U/L (ref 38–126)
BUN: 15 mg/dL (ref 6–20)
CO2: 27 mmol/L (ref 22–32)
Calcium: 8.5 mg/dL — ABNORMAL LOW (ref 8.9–10.3)
Chloride: 105 mmol/L (ref 101–111)
Creatinine, Ser: 0.83 mg/dL (ref 0.44–1.00)
GFR calc Af Amer: >60 mL/min (ref >60)
Glucose, Bld: 104 mg/dL — ABNORMAL HIGH (ref 65–99)
POTASSIUM: 2.3 mmol/L — AB (ref 3.5–5.1)
Sodium: 142 mmol/L (ref 135–145)
TOTAL PROTEIN: 7.7 g/dL (ref 6.5–8.1)
Total Bilirubin: 0.7 mg/dL (ref 0.3–1.2)

## 2017-08-27 LAB — TYPE AND SCREEN
ABO/RH(D): A POS
Antibody Screen: NEGATIVE

## 2017-08-27 LAB — ABO/RH: ABO/RH(D): A POS

## 2017-08-27 NOTE — Pre-Procedure Instructions (Signed)
PATIENT HAS A CRITICAL K LEVEL OF 2.3. DR. Seward Speck AWARE. HE RECOMMENDS K-DUR 40 MEQ A DAY STARTING TODAY UNTIL SAY OF SURGERY. PATIENT CALLED AND MESSAGE LEFT FOR Meagan Dean, [place] AWARE OF EKG

## 2017-08-27 NOTE — Pre-Procedure Instructions (Signed)
Received call from [place] that she got the message about k of 2.1l Dr. Ihor Dow aware and will take care of it

## 2017-08-28 ENCOUNTER — Other Ambulatory Visit: Payer: Self-pay | Admitting: Obstetrics & Gynecology

## 2017-08-28 ENCOUNTER — Telehealth: Payer: Self-pay | Admitting: Obstetrics & Gynecology

## 2017-08-28 DIAGNOSIS — E876 Hypokalemia: Secondary | ICD-10-CM

## 2017-08-28 MED ORDER — POTASSIUM CHLORIDE CRYS ER 20 MEQ PO TBCR: 40.0000 meq | EXTENDED_RELEASE_TABLET | Freq: Every day | ORAL | 1 refills | Status: DC

## 2017-08-28 NOTE — Telephone Encounter (Signed)
Pt is on Spironolactone. On her pre op labs she had a low potassium. I have left a msg that the [facility] is at her pharmacy.  Ladarrion Telfair L. Harraway-Smith, M.D., Cherlynn June

## 2017-09-02 ENCOUNTER — Encounter (HOSPITAL_COMMUNITY)
Admission: AD | Disposition: A | Discharge status: Home / Self Care | Payer: Self-pay | Source: Ambulatory Visit | Attending: Obstetrics & Gynecology

## 2017-09-02 ENCOUNTER — Inpatient Hospital Stay (HOSPITAL_COMMUNITY)
Admission: AD | Admit: 2017-09-02 | Discharge: 2017-09-04 | DRG: 743 | Disposition: A | Discharge status: Home / Self Care | Payer: BLUE CROSS/BLUE SHIELD | Source: Ambulatory Visit | Attending: Obstetrics & Gynecology | Admitting: Obstetrics & Gynecology

## 2017-09-02 ENCOUNTER — Inpatient Hospital Stay (HOSPITAL_COMMUNITY): Payer: BLUE CROSS/BLUE SHIELD | Admitting: Registered Nurse

## 2017-09-02 ENCOUNTER — Encounter (HOSPITAL_COMMUNITY): Payer: Self-pay | Admitting: *Deleted

## 2017-09-02 ENCOUNTER — Other Ambulatory Visit: Payer: Self-pay

## 2017-09-02 DIAGNOSIS — R102 Pelvic and perineal pain: Secondary | ICD-10-CM | POA: Diagnosis present

## 2017-09-02 DIAGNOSIS — E876 Hypokalemia: Secondary | ICD-10-CM | POA: Diagnosis present

## 2017-09-02 DIAGNOSIS — Z9889 Other specified postprocedural states: Secondary | ICD-10-CM

## 2017-09-02 DIAGNOSIS — D649 Anemia, unspecified: Secondary | ICD-10-CM | POA: Diagnosis present

## 2017-09-02 DIAGNOSIS — I10 Essential (primary) hypertension: Secondary | ICD-10-CM | POA: Diagnosis present

## 2017-09-02 DIAGNOSIS — R109 Unspecified abdominal pain: Secondary | ICD-10-CM

## 2017-09-02 DIAGNOSIS — K56600 Partial intestinal obstruction, unspecified as to cause: Secondary | ICD-10-CM | POA: Diagnosis present

## 2017-09-02 DIAGNOSIS — N803 Endometriosis of pelvic peritoneum: Secondary | ICD-10-CM | POA: Diagnosis present

## 2017-09-02 DIAGNOSIS — K3532 Acute appendicitis with perforation and localized peritonitis, without abscess: Secondary | ICD-10-CM | POA: Diagnosis present

## 2017-09-02 DIAGNOSIS — R87610 Atypical squamous cells of undetermined significance on cytologic smear of cervix (ASC-US): Secondary | ICD-10-CM | POA: Diagnosis present

## 2017-09-02 DIAGNOSIS — D219 Benign neoplasm of connective and other soft tissue, unspecified: Secondary | ICD-10-CM

## 2017-09-02 DIAGNOSIS — Z8742 Personal history of other diseases of the female genital tract: Secondary | ICD-10-CM

## 2017-09-02 DIAGNOSIS — D259 Leiomyoma of uterus, unspecified: Secondary | ICD-10-CM | POA: Diagnosis present

## 2017-09-02 DIAGNOSIS — Z8669 Personal history of other diseases of the nervous system and sense organs: Secondary | ICD-10-CM

## 2017-09-02 DIAGNOSIS — Z9884 Bariatric surgery status: Secondary | ICD-10-CM

## 2017-09-02 DIAGNOSIS — R87810 Cervical high risk human papillomavirus (HPV) DNA test positive: Secondary | ICD-10-CM | POA: Diagnosis not present

## 2017-09-02 DIAGNOSIS — R1030 Lower abdominal pain, unspecified: Secondary | ICD-10-CM | POA: Diagnosis present

## 2017-09-02 HISTORY — DX: Other seasonal allergic rhinitis: J30.2

## 2017-09-02 HISTORY — PX: HYSTERECTOMY ABDOMINAL WITH SALPINGECTOMY: SHX6725

## 2017-09-02 HISTORY — DX: Benign neoplasm of connective and other soft tissue, unspecified: D21.9

## 2017-09-02 HISTORY — PX: LYSIS OF ADHESION: SHX5961

## 2017-09-02 HISTORY — DX: Personal history of urinary calculi: Z87.442

## 2017-09-02 LAB — PREGNANCY, URINE: Preg Test, Ur: NEGATIVE

## 2017-09-02 SURGERY — HYSTERECTOMY, TOTAL, ABDOMINAL, WITH SALPINGECTOMY
Anesthesia: General | Site: Abdomen

## 2017-09-02 MED ORDER — EPHEDRINE SULFATE-NACL 50-0.9 MG/10ML-% IV SOSY
PREFILLED_SYRINGE | INTRAVENOUS | Status: DC | PRN
  Administered 2017-09-02: 15 mg via INTRAVENOUS

## 2017-09-02 MED ORDER — PHENYLEPHRINE 40 MCG/ML (10ML) SYRINGE FOR IV PUSH (FOR BLOOD PRESSURE SUPPORT)
PREFILLED_SYRINGE | INTRAVENOUS | Status: DC | PRN
  Administered 2017-09-02: 80 ug via INTRAVENOUS
  Administered 2017-09-02: 120 ug via INTRAVENOUS

## 2017-09-02 MED ORDER — DIPHENHYDRAMINE HCL 50 MG/ML IJ SOLN: 12.5000 mg | Freq: Four times a day (QID) | INTRAMUSCULAR | Status: DC | PRN

## 2017-09-02 MED ORDER — PANTOPRAZOLE SODIUM 40 MG PO TBEC
40.0000 mg | DELAYED_RELEASE_TABLET | Freq: Every day | ORAL | Status: DC
  Administered 2017-09-03 – 2017-09-04 (×2): 40 mg via ORAL
  Filled 2017-09-02 (×2): qty 1

## 2017-09-02 MED ORDER — MEPERIDINE HCL 25 MG/ML IJ SOLN: 6.2500 mg | INTRAMUSCULAR | Status: DC | PRN

## 2017-09-02 MED ORDER — POLYETHYLENE GLYCOL 3350 17 G PO PACK: 17.0000 g | PACK | Freq: Every day | ORAL | Status: DC | PRN

## 2017-09-02 MED ORDER — LACTATED RINGERS IV SOLN: INTRAVENOUS | Status: DC

## 2017-09-02 MED ORDER — PHENYLEPHRINE HCL 10 MG PO TABS: 10.0000 mg | ORAL_TABLET | ORAL | Status: DC | PRN

## 2017-09-02 MED ORDER — SCOPOLAMINE 1 MG/3DAYS TD PT72
1.0000 | MEDICATED_PATCH | Freq: Once | TRANSDERMAL | Status: DC
  Administered 2017-09-02: 1.5 mg via TRANSDERMAL

## 2017-09-02 MED ORDER — HYDROMORPHONE HCL 1 MG/ML IJ SOLN
INTRAMUSCULAR | Status: AC
  Administered 2017-09-02: 0.5 mg via INTRAVENOUS
  Filled 2017-09-02: qty 1

## 2017-09-02 MED ORDER — BUPIVACAINE HCL (PF) 0.5 % IJ SOLN
INTRAMUSCULAR | Status: DC | PRN
  Administered 2017-09-02: 44 mL

## 2017-09-02 MED ORDER — DIPHENHYDRAMINE HCL 12.5 MG/5ML PO ELIX: 12.5000 mg | ORAL_SOLUTION | Freq: Four times a day (QID) | ORAL | Status: DC | PRN

## 2017-09-02 MED ORDER — SCOPOLAMINE 1 MG/3DAYS TD PT72
MEDICATED_PATCH | TRANSDERMAL | Status: AC
  Filled 2017-09-02: qty 1

## 2017-09-02 MED ORDER — KETOROLAC TROMETHAMINE 30 MG/ML IJ SOLN
30.0000 mg | Freq: Four times a day (QID) | INTRAMUSCULAR | Status: DC
  Administered 2017-09-02 – 2017-09-03 (×3): 30 mg via INTRAVENOUS
  Filled 2017-09-02 (×3): qty 1

## 2017-09-02 MED ORDER — EPHEDRINE 5 MG/ML INJ
INTRAVENOUS | Status: AC
  Filled 2017-09-02: qty 10

## 2017-09-02 MED ORDER — MENTHOL 3 MG MT LOZG: 1.0000 | LOZENGE | OROMUCOSAL | Status: DC | PRN

## 2017-09-02 MED ORDER — FENTANYL CITRATE (PF) 100 MCG/2ML IJ SOLN
25.0000 ug | INTRAMUSCULAR | Status: DC | PRN
  Administered 2017-09-02 (×3): 50 ug via INTRAVENOUS

## 2017-09-02 MED ORDER — ONDANSETRON HCL 4 MG/2ML IJ SOLN
INTRAMUSCULAR | Status: AC
  Filled 2017-09-02: qty 2

## 2017-09-02 MED ORDER — SUGAMMADEX SODIUM 500 MG/5ML IV SOLN
INTRAVENOUS | Status: AC
  Filled 2017-09-02: qty 5

## 2017-09-02 MED ORDER — MIDAZOLAM HCL 5 MG/5ML IJ SOLN
INTRAMUSCULAR | Status: DC | PRN
  Administered 2017-09-02: 2 mg via INTRAVENOUS

## 2017-09-02 MED ORDER — CEFAZOLIN SODIUM-DEXTROSE 2-4 GM/100ML-% IV SOLN
INTRAVENOUS | Status: AC
  Filled 2017-09-02: qty 100

## 2017-09-02 MED ORDER — SUCCINYLCHOLINE CHLORIDE 200 MG/10ML IV SOSY
PREFILLED_SYRINGE | INTRAVENOUS | Status: AC
  Filled 2017-09-02: qty 10

## 2017-09-02 MED ORDER — PROPOFOL 10 MG/ML IV BOLUS
INTRAVENOUS | Status: AC
  Filled 2017-09-02: qty 20

## 2017-09-02 MED ORDER — PROPOFOL 10 MG/ML IV BOLUS
INTRAVENOUS | Status: DC | PRN
  Administered 2017-09-02: 200 mg via INTRAVENOUS

## 2017-09-02 MED ORDER — IBUPROFEN 600 MG PO TABS
600.0000 mg | ORAL_TABLET | Freq: Four times a day (QID) | ORAL | Status: DC | PRN
  Administered 2017-09-03 (×2): 600 mg via ORAL
  Filled 2017-09-02 (×2): qty 1

## 2017-09-02 MED ORDER — PSEUDOEPHEDRINE HCL 30 MG PO TABS: 30.0000 mg | ORAL_TABLET | Freq: Four times a day (QID) | ORAL | Status: DC | PRN

## 2017-09-02 MED ORDER — DOCUSATE SODIUM 100 MG PO CAPS
100.0000 mg | ORAL_CAPSULE | Freq: Two times a day (BID) | ORAL | Status: DC
  Administered 2017-09-02 – 2017-09-04 (×4): 100 mg via ORAL
  Filled 2017-09-02 (×4): qty 1

## 2017-09-02 MED ORDER — ONDANSETRON HCL 4 MG/2ML IJ SOLN
INTRAMUSCULAR | Status: DC | PRN
  Administered 2017-09-02: 4 mg via INTRAVENOUS

## 2017-09-02 MED ORDER — BUPIVACAINE HCL (PF) 0.5 % IJ SOLN
INTRAMUSCULAR | Status: AC
Start: 2017-09-02 — End: 2017-09-02
  Filled 2017-09-02: qty 30

## 2017-09-02 MED ORDER — PHENYLEPHRINE 40 MCG/ML (10ML) SYRINGE FOR IV PUSH (FOR BLOOD PRESSURE SUPPORT)
PREFILLED_SYRINGE | INTRAVENOUS | Status: AC
  Filled 2017-09-02: qty 10

## 2017-09-02 MED ORDER — ENOXAPARIN SODIUM 40 MG/0.4ML ~~LOC~~ SOLN
40.0000 mg | SUBCUTANEOUS | Status: DC
  Administered 2017-09-03 – 2017-09-04 (×2): 40 mg via SUBCUTANEOUS
  Filled 2017-09-02 (×2): qty 0.4

## 2017-09-02 MED ORDER — ONDANSETRON HCL 4 MG PO TABS
4.0000 mg | ORAL_TABLET | Freq: Four times a day (QID) | ORAL | Status: DC | PRN
Start: 2017-09-02 — End: 2017-09-04
  Administered 2017-09-03: 4 mg via ORAL
  Filled 2017-09-02: qty 1

## 2017-09-02 MED ORDER — FENTANYL CITRATE (PF) 250 MCG/5ML IJ SOLN
INTRAMUSCULAR | Status: AC
  Filled 2017-09-02: qty 5

## 2017-09-02 MED ORDER — MIDAZOLAM HCL 2 MG/2ML IJ SOLN
INTRAMUSCULAR | Status: AC
  Filled 2017-09-02: qty 2

## 2017-09-02 MED ORDER — METOCLOPRAMIDE HCL 5 MG/ML IJ SOLN
INTRAMUSCULAR | Status: AC
  Administered 2017-09-02: 10 mg via INTRAVENOUS
  Filled 2017-09-02: qty 2

## 2017-09-02 MED ORDER — DEXAMETHASONE SODIUM PHOSPHATE 10 MG/ML IJ SOLN
INTRAMUSCULAR | Status: AC
  Filled 2017-09-02: qty 1

## 2017-09-02 MED ORDER — BUPIVACAINE HCL (PF) 0.5 % IJ SOLN
INTRAMUSCULAR | Status: AC
  Filled 2017-09-02: qty 30

## 2017-09-02 MED ORDER — HYDROMORPHONE 1 MG/ML IV SOLN
INTRAVENOUS | Status: DC
  Administered 2017-09-02: 0.6 mg via INTRAVENOUS
  Administered 2017-09-02: 19:00:00 via INTRAVENOUS
  Administered 2017-09-03: 1.6 mg via INTRAVENOUS
  Administered 2017-09-03: 3 mL via INTRAVENOUS
  Administered 2017-09-03: 0.2 mg via INTRAVENOUS
  Filled 2017-09-02: qty 25

## 2017-09-02 MED ORDER — BISACODYL 10 MG RE SUPP: 10.0000 mg | Freq: Every day | RECTAL | Status: DC | PRN

## 2017-09-02 MED ORDER — KETOROLAC TROMETHAMINE 30 MG/ML IJ SOLN: 30.0000 mg | Freq: Four times a day (QID) | INTRAMUSCULAR | Status: DC

## 2017-09-02 MED ORDER — SODIUM CHLORIDE 0.9% FLUSH: 9.0000 mL | INTRAVENOUS | Status: DC | PRN

## 2017-09-02 MED ORDER — SIMETHICONE 80 MG PO CHEW
80.0000 mg | CHEWABLE_TABLET | Freq: Four times a day (QID) | ORAL | Status: DC | PRN
Start: 2017-09-02 — End: 2017-09-04
  Administered 2017-09-03: 80 mg via ORAL
  Filled 2017-09-02: qty 1

## 2017-09-02 MED ORDER — LACTATED RINGERS IV SOLN
INTRAVENOUS | Status: DC
  Administered 2017-09-02 (×4): via INTRAVENOUS

## 2017-09-02 MED ORDER — SUGAMMADEX SODIUM 500 MG/5ML IV SOLN
INTRAVENOUS | Status: DC | PRN
  Administered 2017-09-02: 300 mg via INTRAVENOUS

## 2017-09-02 MED ORDER — ROCURONIUM BROMIDE 10 MG/ML (PF) SYRINGE
PREFILLED_SYRINGE | INTRAVENOUS | Status: DC | PRN
  Administered 2017-09-02: 20 mg via INTRAVENOUS
  Administered 2017-09-02: 60 mg via INTRAVENOUS

## 2017-09-02 MED ORDER — LIDOCAINE HCL (CARDIAC) PF 100 MG/5ML IV SOSY
PREFILLED_SYRINGE | INTRAVENOUS | Status: AC
  Filled 2017-09-02: qty 5

## 2017-09-02 MED ORDER — TRAMADOL HCL 50 MG PO TABS
50.0000 mg | ORAL_TABLET | Freq: Four times a day (QID) | ORAL | Status: DC | PRN
  Administered 2017-09-03 – 2017-09-04 (×5): 50 mg via ORAL
  Filled 2017-09-02 (×5): qty 1

## 2017-09-02 MED ORDER — POTASSIUM CHLORIDE 2 MEQ/ML IV SOLN
INTRAVENOUS | Status: DC
  Administered 2017-09-02 – 2017-09-03 (×2): via INTRAVENOUS
  Filled 2017-09-02 (×5): qty 1000

## 2017-09-02 MED ORDER — LIDOCAINE 2% (20 MG/ML) 5 ML SYRINGE
INTRAMUSCULAR | Status: DC | PRN
  Administered 2017-09-02: 100 mg via INTRAVENOUS

## 2017-09-02 MED ORDER — KCL IN DEXTROSE-NACL 40-5-0.45 MEQ/L-%-% IV SOLN: INTRAVENOUS | Status: DC

## 2017-09-02 MED ORDER — FENTANYL CITRATE (PF) 250 MCG/5ML IJ SOLN
INTRAMUSCULAR | Status: DC | PRN
  Administered 2017-09-02 (×2): 50 ug via INTRAVENOUS
  Administered 2017-09-02: 100 ug via INTRAVENOUS
  Administered 2017-09-02: 50 ug via INTRAVENOUS

## 2017-09-02 MED ORDER — DEXAMETHASONE SODIUM PHOSPHATE 10 MG/ML IJ SOLN
INTRAMUSCULAR | Status: DC | PRN
  Administered 2017-09-02: 10 mg via INTRAVENOUS

## 2017-09-02 MED ORDER — KETOROLAC TROMETHAMINE 30 MG/ML IJ SOLN
INTRAMUSCULAR | Status: AC
  Filled 2017-09-02: qty 1

## 2017-09-02 MED ORDER — KETOROLAC TROMETHAMINE 30 MG/ML IJ SOLN
INTRAMUSCULAR | Status: DC | PRN
  Administered 2017-09-02: 30 mg via INTRAVENOUS

## 2017-09-02 MED ORDER — ONDANSETRON HCL 4 MG/2ML IJ SOLN: 4.0000 mg | Freq: Four times a day (QID) | INTRAMUSCULAR | Status: DC | PRN

## 2017-09-02 MED ORDER — CEFAZOLIN SODIUM-DEXTROSE 2-4 GM/100ML-% IV SOLN
2.0000 g | INTRAVENOUS | Status: AC
  Administered 2017-09-02: 2 g via INTRAVENOUS

## 2017-09-02 MED ORDER — NALOXONE HCL 0.4 MG/ML IJ SOLN: 0.4000 mg | INTRAMUSCULAR | Status: DC | PRN

## 2017-09-02 MED ORDER — HYDROMORPHONE HCL 1 MG/ML IJ SOLN
0.2500 mg | INTRAMUSCULAR | Status: DC | PRN
  Administered 2017-09-02 (×2): 0.5 mg via INTRAVENOUS

## 2017-09-02 MED ORDER — FENTANYL CITRATE (PF) 100 MCG/2ML IJ SOLN
INTRAMUSCULAR | Status: AC
  Filled 2017-09-02: qty 2

## 2017-09-02 MED ORDER — ROCURONIUM BROMIDE 100 MG/10ML IV SOLN
INTRAVENOUS | Status: AC
  Filled 2017-09-02: qty 1

## 2017-09-02 MED ORDER — FENTANYL CITRATE (PF) 100 MCG/2ML IJ SOLN
INTRAMUSCULAR | Status: AC
  Administered 2017-09-02: 50 ug via INTRAVENOUS
  Filled 2017-09-02: qty 2

## 2017-09-02 MED ORDER — METOCLOPRAMIDE HCL 5 MG/ML IJ SOLN
10.0000 mg | Freq: Once | INTRAMUSCULAR | Status: AC | PRN
  Administered 2017-09-02: 10 mg via INTRAVENOUS

## 2017-09-02 SURGICAL SUPPLY — 50 items
APL SKNCLS STERI-STRIP NONHPOA (GAUZE/BANDAGES/DRESSINGS) ×2
BARRIER ADHS 3X4 INTERCEED (GAUZE/BANDAGES/DRESSINGS) IMPLANT
BENZOIN TINCTURE PRP APPL 2/3 (GAUZE/BANDAGES/DRESSINGS) ×2 IMPLANT
BINDER ABD UNIV 12 45-62 (WOUND CARE) IMPLANT
BINDER ABDOMINAL 46IN 62IN (WOUND CARE)
BRR ADH 4X3 ABS CNTRL BYND (GAUZE/BANDAGES/DRESSINGS)
CANISTER SUCT 3000ML PPV (MISCELLANEOUS) ×4 IMPLANT
CELLS DAT CNTRL 66122 CELL SVR (MISCELLANEOUS) IMPLANT
CLOSURE WOUND 1/2 X4 (GAUZE/BANDAGES/DRESSINGS) ×1
CONT PATH 16OZ SNAP LID 3702 (MISCELLANEOUS) ×4 IMPLANT
DECANTER SPIKE VIAL GLASS SM (MISCELLANEOUS) ×2 IMPLANT
DRAPE WARM FLUID 44X44 (DRAPE) ×2 IMPLANT
DRSG OPSITE POSTOP 4X10 (GAUZE/BANDAGES/DRESSINGS) ×4 IMPLANT
DURAPREP 26ML APPLICATOR (WOUND CARE) ×4 IMPLANT
GAUZE SPONGE 4X4 12PLY STRL (GAUZE/BANDAGES/DRESSINGS) ×4 IMPLANT
GAUZE SPONGE 4X4 16PLY XRAY LF (GAUZE/BANDAGES/DRESSINGS) ×4 IMPLANT
GLOVE BIO SURGEON STRL SZ7 (GLOVE) ×4 IMPLANT
GLOVE BIOGEL PI IND STRL 7.0 (GLOVE) ×8 IMPLANT
GLOVE BIOGEL PI INDICATOR 7.0 (GLOVE) ×8
GOWN STRL REUS W/TWL LRG LVL3 (GOWN DISPOSABLE) ×8 IMPLANT
GOWN STRL REUS W/TWL XL LVL3 (GOWN DISPOSABLE) ×4 IMPLANT
NEEDLE HYPO 22GX1.5 SAFETY (NEEDLE) ×4 IMPLANT
NS IRRIG 1000ML POUR BTL (IV SOLUTION) ×4 IMPLANT
PACK ABDOMINAL GYN (CUSTOM PROCEDURE TRAY) ×4 IMPLANT
PAD ABD 7.5X8 STRL (GAUZE/BANDAGES/DRESSINGS) ×8 IMPLANT
PAD OB MATERNITY 4.3X12.25 (PERSONAL CARE ITEMS) ×4 IMPLANT
PENCIL SMOKE EVAC W/HOLSTER (ELECTROSURGICAL) ×4 IMPLANT
PROTECTOR NERVE ULNAR (MISCELLANEOUS) ×8 IMPLANT
RETRACTOR WND ALEXIS 18 MED (MISCELLANEOUS) IMPLANT
RETRACTOR WND ALEXIS 25 LRG (MISCELLANEOUS) IMPLANT
RTRCTR WOUND ALEXIS 18CM MED (MISCELLANEOUS)
RTRCTR WOUND ALEXIS 25CM LRG (MISCELLANEOUS)
SPONGE LAP 18X18 X RAY DECT (DISPOSABLE) ×8 IMPLANT
STAPLER VISISTAT 35W (STAPLE) ×4 IMPLANT
STRIP CLOSURE SKIN 1/2X4 (GAUZE/BANDAGES/DRESSINGS) ×1 IMPLANT
SUT PDS AB 0 CTX 60 (SUTURE) ×2 IMPLANT
SUT VIC AB 0 CT1 18XCR BRD8 (SUTURE) ×6 IMPLANT
SUT VIC AB 0 CT1 27 (SUTURE) ×8
SUT VIC AB 0 CT1 27XBRD ANBCTR (SUTURE) ×4 IMPLANT
SUT VIC AB 0 CT1 36 (SUTURE) ×4 IMPLANT
SUT VIC AB 0 CT1 8-18 (SUTURE) ×12
SUT VIC AB 3-0 CT1 27 (SUTURE) ×8
SUT VIC AB 3-0 CT1 TAPERPNT 27 (SUTURE) ×2 IMPLANT
SUT VIC AB 3-0 SH 27 (SUTURE)
SUT VIC AB 3-0 SH 27X BRD (SUTURE) IMPLANT
SUT VIC AB 4-0 KS 27 (SUTURE) ×2 IMPLANT
SUT VICRYL 0 TIES 12 18 (SUTURE) ×4 IMPLANT
SYR CONTROL 10ML LL (SYRINGE) ×4 IMPLANT
TOWEL OR 17X24 6PK STRL BLUE (TOWEL DISPOSABLE) ×8 IMPLANT
TRAY FOLEY W/BAG SLVR 14FR (SET/KITS/TRAYS/PACK) ×4 IMPLANT

## 2017-09-02 NOTE — Anesthesia Preprocedure Evaluation (Signed)
Anesthesia Evaluation  Patient identified by MRN, date of birth, ID band Patient awake    Reviewed: Allergy & Precautions, NPO status , Patient's Chart, lab work & pertinent test results  Airway Mallampati: II  TM Distance: >3 FB Neck ROM: Full    Dental no notable dental hx.    Pulmonary neg pulmonary ROS, asthma (allergy induced) , neg sleep apnea,    Pulmonary exam normal breath sounds clear to auscultation       Cardiovascular hypertension, negative cardio ROS Normal cardiovascular exam Rhythm:Regular Rate:Normal     Neuro/Psych negative neurological ROS  negative psych ROS   GI/Hepatic negative GI ROS, Neg liver ROS, neg GERD  ,  Endo/Other  negative endocrine ROS  Renal/GU negative Renal ROS  negative genitourinary   Musculoskeletal negative musculoskeletal ROS (+)   Abdominal   Peds negative pediatric ROS (+)  Hematology negative hematology ROS (+)   Anesthesia Other Findings H/o low K+  Reproductive/Obstetrics negative OB ROS                             Anesthesia Physical Anesthesia Plan  ASA: II  Anesthesia Plan: General   Post-op Pain Management:    Induction: Intravenous  PONV Risk Score and Plan: 4 or greater and Ondansetron, Dexamethasone, Midazolam, Scopolamine patch - Pre-op and Treatment may vary due to age or medical condition  Airway Management Planned: Oral ETT  Additional Equipment:   Intra-op Plan:   Post-operative Plan: Extubation in OR  Informed Consent: I have reviewed the patients History and Physical, chart, labs and discussed the procedure including the risks, benefits and alternatives for the proposed anesthesia with the patient or authorized representative who has indicated his/her understanding and acceptance.   Dental advisory given  Plan Discussed with: CRNA  Anesthesia Plan Comments:         Anesthesia Quick Evaluation

## 2017-09-02 NOTE — Transfer of Care (Signed)
Immediate Anesthesia Transfer of Care Note  Patient: Meagan Dean  Procedure(s) Performed: HYSTERECTOMY ABDOMINAL WITH SALPINGECTOMY (Bilateral Abdomen) LYSIS OF ADHESION (N/A Abdomen)  Patient Location: PACU  Anesthesia Type:General  Level of Consciousness: sedated  Airway & Oxygen Therapy: Patient Spontanous Breathing and Patient connected to nasal cannula oxygen  Post-op Assessment: Report given to RN and Post -op Vital signs reviewed and stable  Post vital signs: Reviewed and stable  Last Vitals:  Vitals Value Taken Time  BP 132/86 09/02/2017  4:27 PM  Temp    Pulse 88 09/02/2017  4:29 PM  Resp 16 09/02/2017  4:29 PM  SpO2 100 % 09/02/2017  4:29 PM  Vitals shown include unvalidated device data.  Last Pain:  Vitals:   09/02/17 1209  TempSrc: Oral  PainSc: 10-Worst pain ever      Patients Stated Pain Goal: 4 (21/11/55 2080)  Complications: No apparent anesthesia complications

## 2017-09-02 NOTE — Anesthesia Procedure Notes (Signed)
Procedure Name: Intubation Date/Time: 09/02/2017 2:22 PM Performed by: Talbot Grumbling, CRNA Pre-anesthesia Checklist: Patient identified, Emergency Drugs available, Suction available and Patient being monitored Patient Re-evaluated:Patient Re-evaluated prior to induction Oxygen Delivery Method: Circle system utilized Preoxygenation: Pre-oxygenation with 100% oxygen Induction Type: IV induction Ventilation: Mask ventilation without difficulty Laryngoscope Size: Miller and 2 Grade View: Grade I Tube type: Oral Tube size: 7.0 mm Number of attempts: 1 Airway Equipment and Method: Stylet Placement Confirmation: ETT inserted through vocal cords under direct vision,  positive ETCO2 and breath sounds checked- equal and bilateral Secured at: 21 cm Tube secured with: Tape Dental Injury: Teeth and Oropharynx as per pre-operative assessment

## 2017-09-02 NOTE — Op Note (Addendum)
09/02/2017  5:03 PM  PATIENT:  Meagan Dean  47 y.o. female  PRE-OPERATIVE DIAGNOSIS:  Fibroids Endometriosis Pelvic Pain  POST-OPERATIVE DIAGNOSIS:  fibroids, endometriosis  PROCEDURE:  Procedure(s): HYSTERECTOMY ABDOMINAL WITH SALPINGECTOMY (Bilateral) LYSIS OF ADHESION (N/A)  SURGEON:  Surgeon(s) and Role:    * Lavonia Drafts, MD - Primary    * Donnamae Jude, MD - Assisting  ANESTHESIA:   general  EBL:  300 mL   BLOOD ADMINISTERED:none  DRAINS: none   LOCAL MEDICATIONS USED:  MARCAINE     SPECIMEN:  Source of Specimen:  uterus with cervix and bilateral fallopian tubes.   DISPOSITION OF SPECIMEN:  PATHOLOGY  COUNTS:  YES  TOURNIQUET:  * No tourniquets in log *  DICTATION: .Note written in EPIC  PLAN OF CARE: Admit to inpatient   PATIENT DISPOSITION:  PACU - hemodynamically stable.   Delay start of Pharmacological VTE agent (>24hrs) due to surgical blood loss or risk of bleeding: no  Complications: none immediate   INDICATIONS: The patient is a 47 yo G0 with the aforementioned diagnoses who desires definitive surgical management. On the preoperative visit, the risks, benefits, indications, and alternatives of the procedure were reviewed with the patient. On the day of surgery, the risks of surgery were again discussed with the patient including but not limited to: bleeding which may require transfusion or reoperation; infection which may require antibiotics; injury to bowel, bladder, ureters or other surrounding organs; need for additional procedures; thromboembolic phenomenon, incisional problems and other postoperative/anesthesia complications. The patient has had multiple previous surgeries dn I discussed with her in detail the risk of bowel injury including the possibility of a colostomy. Written informed consent was obtained.   OPERATIVE FINDINGS: A 6 week sized uterus with scar tissues attached to the posterior uterus and the adnexa bilaterally.   The ovary on the right side was densely adherent to the post uterus. And the fallopian tube on the left side was densely adherent to the left side of the uterus. There was obvious endometriosis in the cul de sac.  Although the ovaries were adherent, once the adhesion were released, they were otherwise normal in appearance.    SPECIMENS: Uterus,cervix, bilateral fallopian tubes sent to pathology  COMPLICATIONS: none.   DESCRIPTION OF PROCEDURE: The patient received intravenous antibiotics and had sequential compression devices were applied to her lower extremities while in the preoperative area. She was taken to the operating room and placed under general anesthesia without difficulty. The abdomen and perineum were prepped and draped in a sterile manner, and she was placed in a dorsal supine position. A Foley catheter was inserted into the bladder and attached to constant drainage. After an adequate timeout was performed, a transverse skin incision was made. This incision was taken down to the fascia using a scalpel and cautery with care given to maintain good hemostasis. The fascia was grasped with kocher clamps, tented up and the rectus muscles were dissected off using sharp and blunt dissection on the superior and inferior aspects of the fascial incision. The peritoneum entered sharply without complication. The access device to the gastric band was immediately noted in the left lower quadrant. There was adhesion in the upper abdomen and these were completely avoided and attention was then turned to the pelvis. The uterus at this point was noted and after sharp and careful release the adhesion the uterus was mobilized and grasped with a Lahey clamp. The bowel was packed away with moist laparotomy sponges. The round  ligaments on each side were clamped, suture ligated with 0 Vicryl, and transected with electrocautery allowing entry into the broad ligament. Of note, all sutures used in this procedure are 0 Vicryl  unless otherwise noted. The anterior and posterior leaves of the broad ligament were separated, and the ureters were inspected to be safely away from the area of dissection bilaterally. The uteroovarian ligaments were bliaterally clamped and doubly suture ligated. The uterine arteries were skeletinized bilaterally A bladder flap was then created. The bladder was then bluntly dissected off the lower uterine segment and cervix with good hemostasis noted. The uterine arteries were then skeletonized bilaterally and then clamped, cut, and doubly suture ligated with care given to prevent ureteral injury.  The uterosacral ligaments were then clamped, cut, and ligated bilaterally. Finally, the cardinal ligaments were clamped, cut, and ligated bilaterally. The uterus was removed.  The cuff angles were closed with multiple figure-of-eight sutures.   At this point the fallopian tubes were grasped with a Babcock clamp.  Using a kelly clamp the mesosalpinx was clamped cut and suture ligated, excellent hemostasis was noted.  The pelvis was irrigated and hemostasis was reconfirmed at all pedicles and along the pelvic sidewall. All laparotomy sponges and instruments were removed from the abdomen. Arista was placed over the vaginal cuff and the pedicles after the pelvis was irrigated.  The peritoneum was reapproximated with 0 vicryl.  The fascia was closed with looped PDS . The subcutaneous fascia was closed with 2 layers of 3-0 vicryl and the skin was a subcuticular suture of 4-0 vicryl. The incision was injected with 45cc of .5% Marcaine. Benzoin and steristrips were applied.  Sponge, lap and needle counts were correct times two. The patient was taken to the recovery area awake, extubated and in stable condition.  An experienced assistant was required given the standard of surgical care given the complexity of the case.  This assistant was needed for exposure, dissection, suctioning, retraction, instrument exchange and for  overall help during the procedure.  Jancy Sprankle L. Harraway-Smith, M.D., Cherlynn June

## 2017-09-02 NOTE — H&P (Signed)
Preoperative History and Physical  Meagan Dean is a 47 y.o. G1P0010 here for surgical management of uterine fibroids and pelvic pain.   Proposed surgery: Total abdominal hysterectomy with bilateral salpingectomy and lysis of adhesions  Past Medical History:  Diagnosis Date  . Anemia   . Asthma due to seasonal allergies    MILD-- JUST GOT INHALER-not used yet -allergy induced  . Endometriosis of pelvis   . GERD (gastroesophageal reflux disease)    diet controlled  . H/O hiatal hernia   . Headache    MIGRAINES - none since weight loss surgery  . History of endometriosis 03/28/2017  . History of hypertension    NO ISSUES SINCE WT LOSS AFTER GASTRIC BANDING  . History of kidney stones    surgery to remove  . History of obstructive sleep apnea    DX'D PRIOR TO GASTRIC BANDING  2010 --  NO ISSUES SINCE WT LOSS  . Hypertension 03/28/2017  . Low potassium syndrome    hopsitalized for 3 days  . Right ureteral stone   . Seasonal allergies   . Sinus headache   . Sleep apnea    prior to weight loss surgery, does not have cpap  . Urgency of urination    Past Surgical History:  Procedure Laterality Date  . APPENDECTOMY    . bowel obstruction  2017  . CYSTOSCOPY WITH RETROGRADE PYELOGRAM, URETEROSCOPY AND STENT PLACEMENT Right 06/18/2013   Procedure: CYSTOSCOPY WITH RETROGRADE PYELOGRAM, URETEROSCOPY AND STENT PLACEMENT;  Surgeon: Franchot Gallo, MD;  Location: [facility];  Service: Urology;  Laterality: Right;  . CYSTOSCOPY WITH RETROGRADE PYELOGRAM, URETEROSCOPY AND STENT PLACEMENT Right 07/05/2013   Procedure: CYSTOSCOPY , URETEROSCOPY ANDstone extraction;  Surgeon: Franchot Gallo, MD;  Location: [facility];  Service: Urology;  Laterality: Right;  . EXPLORATORY LAPARTOMY/  MYOMECTOMY  2005  . LAPAROSCOPIC APPENDECTOMY N/A 07/28/2014   Procedure: APPENDECTOMY LAPAROSCOPIC;  Surgeon: Jackolyn Confer, MD;  Location: [facility];  Service: General;  Laterality: N/A;  .  LAPAROSCOPIC GASTRIC BANDING  06-06-2008  . LAPAROSCOPY /  OVARIAN CYSTECTOMY/  LASER ABLATION ENDOMETRIOSIS  2003  . LAPAROTOMY N/A 11/24/2015   Procedure: EXPLORATORY LAPAROTOMY;  Surgeon: Georganna Skeans, MD;  Location: [place];  Service: General;  Laterality: N/A;  . LYSIS OF ADHESION N/A 11/24/2015   Procedure: LYSIS OF ADHESION;  Surgeon: Georganna Skeans, MD;  Location: [place];  Service: General;  Laterality: N/A;  . wisdom teeth ext     OB History    Gravida  1   Para      Term      Preterm      AB  1   Living        SAB  1   TAB      Ectopic      Multiple      Live Births             Patient denies any cervical dysplasia or STIs. Medications Prior to Admission  Medication Sig Dispense Refill Last Dose  . albuterol (PROVENTIL HFA;VENTOLIN HFA) 108 (90 Base) MCG/ACT inhaler Inhale 1-2 puffs into the lungs every 6 (six) hours as needed for wheezing or shortness of breath. allergy induced asthma     . cetirizine (ZYRTEC) 10 MG tablet Take 10 mg daily by mouth.   Past Week at Unknown time  . diclofenac (CATAFLAM) 50 MG tablet Take 1 tablet (50 mg total) by mouth 3 (three) times daily. (Patient taking differently: Take  50 mg by mouth 2 (two) times daily as needed (pain). ) 60 tablet 1 Past Month at Unknown time  . diphenhydrAMINE (BENADRYL) 25 mg capsule Take 25 mg every 6 (six) hours as needed by mouth for allergies or sleep.   Past Week at Unknown time  . phenylephrine (SUDAFED PE) 10 MG TABS tablet Take 10 mg every 4 (four) hours as needed by mouth (sinus congestion).   09/02/2017 at 0800  . potassium chloride SA (K-DUR,KLOR-CON) 20 MEQ tablet Take 2 tablets (40 mEq total) by mouth daily. 30 tablet 1 09/02/2017 at 0800  . traMADol (ULTRAM) 50 MG tablet Take 1 tablet (50 mg total) by mouth every 6 (six) hours as needed. (Patient taking differently: Take 50 mg by mouth every 6 (six) hours as needed for moderate pain. ) 60 tablet 0   . norgestimate-ethinyl estradiol  (ORTHO-CYCLEN,SPRINTEC,PREVIFEM) 0.25-35 MG-MCG tablet Take 1 tablet by mouth daily. (Patient not taking: Reported on 08/21/2017) 1 Package 11 Not Taking at Unknown time  . spironolactone (ALDACTONE) 25 MG tablet Take 0.5 tablets (12.5 mg total) by mouth daily. (Patient not taking: Reported on 08/21/2017) 30 tablet 3 Not Taking at Unknown time    Allergies  Allergen Reactions  . Other Itching    Reaction to powder in gloves  . Vicodin [Hydrocodone-Acetaminophen] Other (See Comments)    Made her feel "high"   Social History:   reports that she has never smoked. She has never used smokeless tobacco. She reports that she drinks alcohol. She reports that she does not use drugs. Family History  Problem Relation Age of Onset  . Cancer Maternal Grandmother        colon/liver  . Stroke Father     Review of Systems: Noncontributory  PHYSICAL EXAM: Blood pressure 124/89, pulse 69, temperature 98.1 F (36.7 C), temperature source Oral, resp. rate 18, SpO2 100 %. General appearance - alert, well appearing, and in no distress Chest - clear to auscultation, no wheezes, rales or rhonchi, symmetric air entry Heart - normal rate and regular rhythm Abdomen - soft, nontender, nondistended, no masses or organomegaly; multiple well healed incision in abd. Including a vertical incision Pelvic - examination not indicated Extremities - peripheral pulses normal, no pedal edema, no clubbing or cyanosis  Labs: Results for orders placed or performed during the hospital encounter of 08/27/17 (from the past 336 hour(s))  CBC   Collection Time: 08/27/17 11:45 AM  Result Value Ref Range   WBC 5.1 4.0 - 10.5 K/uL   RBC 4.21 3.87 - 5.11 MIL/uL   Hemoglobin 11.8 (L) 12.0 - 15.0 g/dL   HCT 35.2 (L) 36.0 - 46.0 %   MCV 83.6 78.0 - 100.0 fL   MCH 28.0 26.0 - 34.0 pg   MCHC 33.5 30.0 - 36.0 g/dL   RDW 14.0 11.5 - 15.5 %   Platelets 261 150 - 400 K/uL  Comprehensive metabolic panel   Collection Time: 08/27/17  11:45 AM  Result Value Ref Range   Sodium 142 135 - 145 mmol/L   Potassium 2.3 (LL) 3.5 - 5.1 mmol/L   Chloride 105 101 - 111 mmol/L   CO2 27 22 - 32 mmol/L   Glucose, Bld 104 (H) 65 - 99 mg/dL   BUN 15 6 - 20 mg/dL   Creatinine, Ser 0.83 0.44 - 1.00 mg/dL   Calcium 8.5 (L) 8.9 - 10.3 mg/dL   Total Protein 7.7 6.5 - 8.1 g/dL   Albumin 3.8 3.5 - 5.0 g/dL  AST 21 15 - 41 U/L   ALT 13 (L) 14 - 54 U/L   Alkaline Phosphatase 57 38 - 126 U/L   Total Bilirubin 0.7 0.3 - 1.2 mg/dL   GFR calc non Af Amer >60 >60 mL/min   GFR calc Af Amer >60 >60 mL/min   Anion gap 10 5 - 15  Type and screen   Collection Time: 08/27/17 11:45 AM  Result Value Ref Range   ABO/RH(D) A POS    Antibody Screen NEG    Sample Expiration 09/10/2017    Extend sample reason      NO TRANSFUSIONS OR PREGNANCY IN THE PAST 3 MONTHS Performed at [facility], [address], [place], [place] [address]   ABO/Rh   Collection Time: 08/27/17 11:45 AM  Result Value Ref Range   ABO/RH(D)      A POS Performed at [facility], [address], [place], [place] [address]     Imaging Studies: 05/25/2017   CLINICAL DATA:  Chronic pelvic pain. History of fibroids, endometriosis. History of myomectomy and ovarian cystectomy.  EXAM: TRANSABDOMINAL AND TRANSVAGINAL ULTRASOUND OF PELVIS  TECHNIQUE: Both transabdominal and transvaginal ultrasound examinations of the pelvis were performed. Transabdominal technique was performed for global imaging of the pelvis including uterus, ovaries, adnexal regions, and pelvic cul-de-sac. It was necessary to proceed with endovaginal exam following the transabdominal exam to visualize the uterus, endometrium, ovaries, adnexal regions.  COMPARISON:  CT 03/21/2017  FINDINGS: Uterus  Measurements: 6.7 x 4.8 x 4.7 centimeters. Uterus is retroverted. At least 3 discrete fibroids are identified. Posterior left uterine fibroid is 2.9 x 2.6 x 2.5 centimeters. Posterior fundal  myometrial fibroid is 1.1 x 0.9 x 0.7 centimeters. Anterior fundal fibroid is 1.4 x 1.3 x 1.1 centimeters.  Endometrium  Thickness: 5.9 millimeter.  Visualized portion is unremarkable.  Right ovary  Measurements: 2.9 x 1.4 x 1.4 centimeters. Normal appearance/no adnexal mass.  Left ovary  Measurements: 2.5 x 1.8 x 1.4 centimeter. Normal appearance/no adnexal mass.  Other findings  No abnormal free fluid.  IMPRESSION: 1. Retroverted uterus with multiple fibroids, largest measuring 2.9 centimeters. 2. Normal appearance of the endometrium. 3. Normal appearance of both ovaries.  Assessment: Patient Active Problem List   Diagnosis Date Noted  . Fibroids 09/02/2017  . History of endometriosis 03/28/2017  . Hypertension 03/28/2017  . Abdominal pain in female 03/21/2017  . ASCUS with positive high risk HPV cervical 03/15/2017  . Vaginal discharge 03/07/2017  . Amenorrhea 03/07/2017  . Microscopic hematuria 02/01/2017  . Urgency of micturition 01/31/2017  . History of obstructive sleep apnea   . Diarrhea 12/15/2015  . History of laparoscopic adjustable gastric banding   . Partial small bowel obstruction ([place]) 11/17/2015  . Healthcare maintenance 06/27/2015  . Routine health maintenance 04/03/2015  . Perforated appendicitis s/p lap appy 07/28/2014 07/28/2014  . Hypokalemia 06/17/2013  . Calculus of ureter 06/16/2013    Plan: Patient will undergo surgical management with Total abdominal hysterectomy with bilateral salpingectomy and lysis of adhesions.   The risks of surgery were discussed in detail with the patient including but not limited to: bleeding which may require transfusion or reoperation; infection which may require antibiotics; injury to surrounding organs which may involve bowel, bladder, ureters ; need for additional procedures including laparoscopy or laparotomy; thromboembolic phenomenon, surgical site problems and other postoperative/anesthesia  complications.GIven the large number os previous surgeries, I have explained to pt the possible risk of a bowel injury with a possible colostomy. Likelihood of  success in alleviating the patient's condition was discussed. Routine postoperative instructions will be reviewed with the patient and her family in detail after surgery.  The patient concurred with the proposed plan, giving informed written consent for the surgery.  Patient has been NPO since last night she will remain NPO for procedure.  Anesthesia and OR aware.  Preoperative prophylactic antibiotics and SCDs ordered on call to the OR.  To OR when ready.  Orvan Papadakis L. Ihor Dow, M.D., [facility] 09/02/2017 12:23 PM

## 2017-09-02 NOTE — Brief Op Note (Signed)
09/02/2017  5:03 PM  PATIENT:  Meagan Dean  47 y.o. female  PRE-OPERATIVE DIAGNOSIS:  Fibroids Endometriosis Pelvic Pain  POST-OPERATIVE DIAGNOSIS:  fibroids, endometriosis  PROCEDURE:  Procedure(s): HYSTERECTOMY ABDOMINAL WITH SALPINGECTOMY (Bilateral) LYSIS OF ADHESION (N/A)  SURGEON:  Surgeon(s) and Role:    * Meagan Drafts, MD - Primary    * Meagan Jude, MD - Assisting  ANESTHESIA:   general  EBL:  300 mL   BLOOD ADMINISTERED:none  DRAINS: none   LOCAL MEDICATIONS USED:  MARCAINE     SPECIMEN:  Source of Specimen:  uterus with cervix and bilateral fallopian tubes.   DISPOSITION OF SPECIMEN:  PATHOLOGY  COUNTS:  YES  TOURNIQUET:  * No tourniquets in log *  DICTATION: .Note written in EPIC  PLAN OF CARE: Admit to inpatient   PATIENT DISPOSITION:  PACU - hemodynamically stable.   Delay start of Pharmacological VTE agent (>24hrs) due to surgical blood loss or risk of bleeding: no  Complications: none immediate  Meagan Dean L. Meagan Dean, M.D., Meagan Dean

## 2017-09-02 NOTE — Anesthesia Postprocedure Evaluation (Signed)
Anesthesia Post Note  Patient: Meagan Dean  Procedure(s) Performed: HYSTERECTOMY ABDOMINAL WITH SALPINGECTOMY (Bilateral Abdomen) LYSIS OF ADHESION (N/A Abdomen)     Patient location during evaluation: PACU Anesthesia Type: General Level of consciousness: awake and alert Pain management: pain level controlled Vital Signs Assessment: post-procedure vital signs reviewed and stable Respiratory status: spontaneous breathing, nonlabored ventilation, respiratory function stable and patient connected to nasal cannula oxygen Cardiovascular status: blood pressure returned to baseline and stable Postop Assessment: no apparent nausea or vomiting Anesthetic complications: no    Last Vitals:  Vitals:   09/02/17 1645 09/02/17 1700  BP: 108/77 100/89  Pulse: 92 81  Resp: 18 17  Temp:    SpO2: 100% 100%    Last Pain:  Vitals:   09/02/17 1630  TempSrc:   PainSc: Asleep   Pain Goal: Patients Stated Pain Goal: 4 (09/02/17 1645)               Montez Hageman

## 2017-09-03 ENCOUNTER — Other Ambulatory Visit: Payer: Self-pay

## 2017-09-03 ENCOUNTER — Encounter (HOSPITAL_COMMUNITY): Payer: Self-pay | Admitting: Obstetrics & Gynecology

## 2017-09-03 LAB — BASIC METABOLIC PANEL
Anion gap: 7 (ref 5–15)
BUN: 14 mg/dL (ref 6–20)
CALCIUM: 7.8 mg/dL — AB (ref 8.9–10.3)
CHLORIDE: 104 mmol/L (ref 101–111)
CO2: 26 mmol/L (ref 22–32)
CREATININE: 0.84 mg/dL (ref 0.44–1.00)
GFR calc non Af Amer: >60 mL/min (ref >60)
Glucose, Bld: 136 mg/dL — ABNORMAL HIGH (ref 65–99)
Potassium: 4.4 mmol/L (ref 3.5–5.1)
SODIUM: 137 mmol/L (ref 135–145)

## 2017-09-03 LAB — CBC
HCT: 24.4 % — ABNORMAL LOW (ref 36.0–46.0)
HCT: 22.9 % — ABNORMAL LOW (ref 36.0–46.0)
Hemoglobin: 8.3 g/dL — ABNORMAL LOW (ref 12.0–15.0)
Hemoglobin: 7.7 g/dL — ABNORMAL LOW (ref 12.0–15.0)
MCH: 28.4 pg (ref 26.0–34.0)
MCH: 28.2 pg (ref 26.0–34.0)
MCHC: 34 g/dL (ref 30.0–36.0)
MCHC: 33.6 g/dL (ref 30.0–36.0)
MCV: 83.6 fL (ref 78.0–100.0)
MCV: 83.9 fL (ref 78.0–100.0)
PLATELETS: 328 10*3/uL (ref 150–400)
PLATELETS: 302 10*3/uL (ref 150–400)
RBC: 2.92 MIL/uL — AB (ref 3.87–5.11)
RBC: 2.73 MIL/uL — ABNORMAL LOW (ref 3.87–5.11)
RDW: 14 % (ref 11.5–15.5)
RDW: 14.1 % (ref 11.5–15.5)
WBC: 8.3 10*3/uL (ref 4.0–10.5)
WBC: 8 10*3/uL (ref 4.0–10.5)

## 2017-09-03 MED ORDER — SODIUM CHLORIDE 0.9 % IV SOLN: 2.0000 mg/h | Freq: Once | INTRAVENOUS | Status: DC

## 2017-09-03 MED ORDER — DIPHENHYDRAMINE HCL 25 MG PO CAPS
25.0000 mg | ORAL_CAPSULE | Freq: Four times a day (QID) | ORAL | Status: DC | PRN
Start: 2017-09-03 — End: 2017-09-04
  Administered 2017-09-03 – 2017-09-04 (×3): 25 mg via ORAL
  Filled 2017-09-03 (×3): qty 1

## 2017-09-03 MED ORDER — HYDROMORPHONE HCL 2 MG/ML IJ SOLN
2.0000 mg | Freq: Once | INTRAMUSCULAR | Status: AC
  Administered 2017-09-03: 2 mg via INTRAVENOUS
  Filled 2017-09-03: qty 1

## 2017-09-03 NOTE — Addendum Note (Signed)
Addendum  created 09/03/17 0735 by Talbot Grumbling, CRNA   Sign clinical note

## 2017-09-03 NOTE — Anesthesia Postprocedure Evaluation (Signed)
Anesthesia Post Note  Patient: Meagan Dean  Procedure(s) Performed: HYSTERECTOMY ABDOMINAL WITH SALPINGECTOMY (Bilateral Abdomen) LYSIS OF ADHESION (N/A Abdomen)     Patient location during evaluation: [facility] Anesthesia Type: General Level of consciousness: awake and alert Pain management: pain level controlled Vital Signs Assessment: post-procedure vital signs reviewed and stable Respiratory status: spontaneous breathing and patient connected to nasal cannula oxygen Cardiovascular status: blood pressure returned to baseline Postop Assessment: adequate PO intake and no apparent nausea or vomiting Anesthetic complications: no    Last Vitals:  Vitals:   09/03/17 0544 09/03/17 0546  BP: (!) 97/51   Pulse: 73   Resp: 17 18  Temp: 36.8 C   SpO2: 99% 99%    Last Pain:  Vitals:   09/03/17 0602  TempSrc:   PainSc: 5    Pain Goal: Patients Stated Pain Goal: 2 (09/03/17 0602)               [facility]

## 2017-09-03 NOTE — Progress Notes (Signed)
Subjective: Patient reports tolerating PO.  She reports some soreness in her lower abd but, reports that the pressure that she was feeling preop is already relieved. She is tolerating her diet. She was up to the chair late last night but, has not walked outside of her room.  No dizzinness or SOB. Pt does reports feeling drugged last night. The Dilaudid PCA was stopped.      Objective: I have reviewed patient's vital signs, intake and output, medications and labs.  General: alert and no distress Resp: clear to auscultation bilaterally Cardio: regular rate and rhythm, S1, S2 normal, no murmur, click, rub or gallop GI: soft, non-tender; bowel sounds normal; no masses,  no organomegaly and incision: clean, dry and intact Vaginal Bleeding: moderate and passed a blood clot last night. No bleedign so far today,  CBC Latest Ref Rng & Units 09/03/2017 08/27/2017 03/21/2017  WBC 4.0 - 10.5 K/uL 8.3 5.1 7.9  Hemoglobin 12.0 - 15.0 g/dL 8.3(L) 11.8(L) 10.9(L)  Hematocrit 36.0 - 46.0 % 24.4(L) 35.2(L) 33.5(L)  Platelets 150 - 400 K/uL 328 261 361   BMP Latest Ref Rng & Units 09/03/2017 08/27/2017 04/24/2017  Glucose 65 - 99 mg/dL 136(H) 104(H) 85  BUN 6 - 20 mg/dL 14 15 10   Creatinine 0.44 - 1.00 mg/dL 0.84 0.83 0.70  BUN/Creat Ratio 9 - 23 - - 14  Sodium 135 - 145 mmol/L 137 142 139  Potassium 3.5 - 5.1 mmol/L 4.4 2.3(LL) 3.2(L)  Chloride 101 - 111 mmol/L 104 105 98  CO2 22 - 32 mmol/L 26 27 29   Calcium 8.9 - 10.3 mg/dL 7.8(L) 8.5(L) 9.2    Assessment/Plan:  Pt is doing well post op. Her Hgb dropped more than I would have expected from her reported blood loss so I will follow that with a repeat CBC later today and in the am.  Clinically, pt is without complaint or concerning signs or sx. Her urine outpt is good.  Remove foley Encourage ambulation  heplock IVF F/u surg path when available.      LOS: 1 day    Lavonia Drafts 09/03/2017, 8:51 AM

## 2017-09-03 NOTE — Progress Notes (Signed)
Pt bleeding  Scant amount    Has ambulated  With no increased bleeding   VS stable  and no dizziness   Noted  Pt will call for all pads to be checked

## 2017-09-04 LAB — CBC
HCT: 22.3 % — ABNORMAL LOW (ref 36.0–46.0)
Hemoglobin: 7.3 g/dL — ABNORMAL LOW (ref 12.0–15.0)
MCH: 28.2 pg (ref 26.0–34.0)
MCHC: 32.7 g/dL (ref 30.0–36.0)
MCV: 86.1 fL (ref 78.0–100.0)
Platelets: 274 10*3/uL (ref 150–400)
RBC: 2.59 MIL/uL — ABNORMAL LOW (ref 3.87–5.11)
RDW: 14.5 % (ref 11.5–15.5)
WBC: 7.1 10*3/uL (ref 4.0–10.5)

## 2017-09-04 MED ORDER — POTASSIUM CHLORIDE CRYS ER 10 MEQ PO TBCR: 40.0000 meq | EXTENDED_RELEASE_TABLET | Freq: Every day | ORAL | 0 refills | Status: DC

## 2017-09-04 MED ORDER — SODIUM CHLORIDE 0.9 % IV SOLN
510.0000 mg | Freq: Once | INTRAVENOUS | Status: AC
  Administered 2017-09-04: 510 mg via INTRAVENOUS
  Filled 2017-09-04: qty 17

## 2017-09-04 MED ORDER — HYDROMORPHONE HCL 1 MG/ML IJ SOLN
1.0000 mg | Freq: Once | INTRAMUSCULAR | Status: AC
  Administered 2017-09-04: 1 mg via INTRAVENOUS
  Filled 2017-09-04: qty 1

## 2017-09-04 MED ORDER — HYDROMORPHONE HCL 2 MG PO TABS: 2.0000 mg | ORAL_TABLET | Freq: Four times a day (QID) | ORAL | 0 refills | Status: DC | PRN

## 2017-09-04 MED ORDER — DOCUSATE SODIUM 100 MG PO CAPS: 100.0000 mg | ORAL_CAPSULE | Freq: Two times a day (BID) | ORAL | 0 refills | Status: DC

## 2017-09-04 MED ORDER — KETOROLAC TROMETHAMINE 30 MG/ML IJ SOLN
30.0000 mg | Freq: Once | INTRAMUSCULAR | Status: AC
  Administered 2017-09-04: 30 mg via INTRAVENOUS
  Filled 2017-09-04: qty 1

## 2017-09-04 MED ORDER — DICLOFENAC POTASSIUM 50 MG PO TABS: 50.0000 mg | ORAL_TABLET | Freq: Three times a day (TID) | ORAL | 1 refills | Status: DC

## 2017-09-04 NOTE — Progress Notes (Signed)
[facility] Attending Note  Subjective:  I was called by Dr. Ihor Dow to evaluate patient with increased lower abdominal pain and "not feeling well". She is POD#2 s/p TAH for fibroids. Postoperative course remarkable for significant blood loss vaginally after surgery (Hgb 11.8->7.3). No signs of intraabdominal bleeding.  She denies lightheadedness or dizziness when ambulating, just concerned about increased pain. Pian was managed effectively on Toradol, she wants this again. Had nausea last night, none currently. No emesis. Tolerated breakfast. No flatus yet, just burping.     Objective:  Blood pressure 95/63, pulse (!) 58, temperature 98.4 F (36.9 C), temperature source Oral, resp. rate 16, height 5\' 2"  (1.575 m), weight 162 lb (73.5 kg), SpO2 100 %. Patient Vitals for the past 24 hrs:  BP Temp Temp src Pulse Resp SpO2  09/04/17 0919 95/63 - - (!) 58 - -  09/04/17 0758 (!) 81/47 98.4 F (36.9 C) Oral (!) 57 16 100 %  09/03/17 2327 96/64 98.3 F (36.8 C) Oral 85 18 95 %  09/03/17 2054 106/66 98.9 F (37.2 C) Oral 75 18 100 %  09/03/17 1625 108/70 98.5 F (36.9 C) - 82 18 100 %  09/03/17 1158 112/65 98.6 F (37 C) Oral 87 18 100 %   Gen: NAD HENT: Normocephalic, atraumatic Lungs: Normal respiratory effort Heart: Low rate noted Abdomen: soft, +BS, +moderate TTP, no rebound Pelvis: Small amount of blood on pad Ext: 2+ DTRs, no edema, no cyanosis, negative Homan's sign  CBC Latest Ref Rng & Units 09/04/2017 09/03/2017 09/03/2017  WBC 4.0 - 10.5 K/uL 7.1 8.0 8.3  Hemoglobin 12.0 - 15.0 g/dL 7.3(L) 7.7(L) 8.3(L)  Hematocrit 36.0 - 46.0 % 22.3(L) 22.9(L) 24.4(L)  Platelets 150 - 400 K/uL 274 302 328    Assessment & Plan:  47 y.o. G1P0010 POD#2 s/p TAH with postoperative pain and not feeling well. No acute issues.  - Will give Toradol 30 mg IV x 1 now, Dilaudid prn. - Heating pad for patient - Will give stool softeners, Dulcolax to help with flatus. - Continue OOB -  Feraheme ordered for her anemia, no need for transfusion for now.  - Continue close observation, may be able to go later today after evaluation by her surgeon (Dr. Ihor Dow), if she remains stable.  Dr. Ihor Dow was made aware of encounter details and recommendations.   Verita Schneiders, MD, [place] for [employer] [facility], [place]

## 2017-09-04 NOTE — Discharge Instructions (Signed)
Abdominal Hysterectomy, Care After °This sheet gives you information about how to care for yourself after your procedure. Your doctor may also give you more specific instructions. If you have problems or questions, contact your doctor. °Follow these instructions at home: °Bathing °· Do not take baths, swim, or use a hot tub until your doctor says it is okay. Ask your doctor if you can take showers. You may only be allowed to take sponge baths for bathing. °· Keep the bandage (dressing) dry until your doctor says it can be taken off. °Surgical cut ( °incision) care °· Follow instructions from your doctor about how to take care of your cut from surgery. Make sure you: °? Wash your hands with soap and water before you change your bandage (dressing). If you cannot use soap and water, use hand sanitizer. °? Change your bandage as told by your doctor. °? Leave stitches (sutures), skin glue, or skin tape (adhesive) strips in place. They may need to stay in place for 2 weeks or longer. If tape strips get loose and curl up, you may trim the loose edges. Do not remove tape strips completely unless your doctor says it is okay. °· Check your surgical cut area every day for signs of infection. Check for: °? Redness, swelling, or pain. °? Fluid or blood. °? Warmth. °? Pus or a bad smell. °Activity °· Do gentle, daily exercise as told by your doctor. You may be told to take short walks every day and go farther each time. °· Do not lift anything that is heavier than 10 lb (4.5 kg), or the limit that your doctor tells you, until he or she says that it is safe. °· Do not drive or use heavy machinery while taking prescription pain medicine. °· Do not drive for 24 hours if you were given a medicine to help you relax (sedative). °· Follow your doctor's advice about exercise, driving, and general activities. Ask your doctor what activities are safe for you. °Lifestyle °· Do not douche, use tampons, or have sex for at least 6 weeks or as  told by your doctor. °· Do not drink alcohol until your doctor says it is okay. °· Drink enough fluid to keep your pee (urine) clear or pale yellow. °· Try to have someone at home with you for the first 1-2 weeks to help. °· Do not use any products that contain nicotine or tobacco, such as cigarettes and e-cigarettes. These can slow down healing. If you need help quitting, ask your doctor. °General instructions °· Take over-the-counter and prescription medicines only as told by your doctor. °· Do not take aspirin or ibuprofen. These medicines can cause bleeding. °· To prevent or treat constipation while you are taking prescription pain medicine, your doctor may suggest that you: °? Drink enough fluid to keep your urine clear or pale yellow. °? Take over-the-counter or prescription medicines. °? Eat foods that are high in fiber, such as: °§ Fresh fruits and vegetables. °§ Whole grains. °§ Beans. °? Limit foods that are high in fat and processed sugars, such as fried and sweet foods. °· Keep all follow-up visits as told by your doctor. This is important. °Contact a doctor if: °· You have chills or fever. °· You have redness, swelling, or pain around your cut. °· You have fluid or blood coming from your cut. °· Your cut feels warm to the touch. °· You have pus or a bad smell coming from your cut. °· Your cut breaks   open. °· You feel dizzy or light-headed. °· You have pain or bleeding when you pee. °· You keep having watery poop (diarrhea). °· You keep feeling sick to your stomach (nauseous) or keep throwing up (vomiting). °· You have unusual fluid (discharge) coming from your vagina. °· You have a rash. °· You have a reaction to your medicine. °· Your pain medicine does not help. °Get help right away if: °· You have a fever and your symptoms get worse all of a sudden. °· You have very bad belly (abdominal) pain. °· You are short of breath. °· You pass out (faint). °· You have pain, swelling, or redness of your  leg. °· You bleed a lot from your vagina and notice clumps of blood (clots). °Summary °· Do not take baths, swim, or use a hot tub until your doctor says it is okay. Ask your doctor if you can take showers. You may only be allowed to take sponge baths for bathing. °· Follow your doctor's advice about exercise, driving, and general activities. Ask your doctor what activities are safe for you. °· Do not lift anything that is heavier than 10 lb (4.5 kg), or the limit that your doctor tells you, until he or she says that it is safe. °· Try to have someone at home with you for the first 1-2 weeks to help. °This information is not intended to replace advice given to you by your health care provider. Make sure you discuss any questions you have with your health care provider. °Document Released: 02/06/2008 Document Revised: 04/17/2016 Document Reviewed: 04/17/2016 °Elsevier Interactive Patient Education © 2017 [employer] ° °

## 2017-09-04 NOTE — Progress Notes (Signed)
Patient discharged home with friend. Discharge teaching, home care, prescriptions, and follow-up appts reviewed. Pt verbalized understanding.

## 2017-09-04 NOTE — Discharge Summary (Signed)
Physician Discharge Summary  Patient ID: ANALISSE RANDLE MRN: [id number] DOB/AGE: Feb 17, 1971 47 y.o.  Admit date: 09/02/2017 Discharge date: 09/04/2017  Admission Diagnoses: Fibroids  Pelvic pain endometriosis  Discharge Diagnoses:  Principal Problem:   Fibroids Active Problems:   Hypokalemia   Perforated appendicitis s/p lap appy 07/28/2014   Partial small bowel obstruction (HCC)   History of laparoscopic adjustable gastric banding   History of obstructive sleep apnea   ASCUS with positive high risk HPV cervical   Abdominal pain in female   History of endometriosis   Hypertension   Post-operative state  Discharged Condition: good  Hospital Course: Patient had an uncomplicated surgery; for further details of this surgery, please refer to the operative note. Furthermore, the patient had an uncomplicated postoperative course.  By time of discharge, her pain was controlled on oral pain medications; she was ambulating, voiding without difficulty, tolerating regular diet and passing flatus.  She was deemed stable for discharge to home.    Consults: None  Significant Diagnostic Studies: labs: CBC, BMP  Treatments: surgery: TAH with bilateral salpingectomy   Discharge Exam: Blood pressure 97/63, pulse 65, temperature 98.7 F (37.1 C), temperature source Oral, resp. rate 18, height 5\' 2"  (1.575 m), weight 162 lb (73.5 kg), SpO2 95 %. General appearance: alert and no distress Resp: clear to auscultation bilaterally Cardio: regular rate and rhythm, S1, S2 normal, no murmur, click, rub or gallop GI: soft, non-tender; bowel sounds normal; no masses,  no organomegaly Extremities: extremities normal, atraumatic, no cyanosis or edema Incision/Wound: clean, dry and intact  CBC Latest Ref Rng & Units 09/04/2017 09/03/2017 09/03/2017  WBC 4.0 - 10.5 K/uL 7.1 8.0 8.3  Hemoglobin 12.0 - 15.0 g/dL 7.3(L) 7.7(L) 8.3(L)  Hematocrit 36.0 - 46.0 % 22.3(L) 22.9(L) 24.4(L)  Platelets 150 - 400 K/uL  274 302 328   BMP Latest Ref Rng & Units 09/03/2017 08/27/2017 04/24/2017  Glucose 65 - 99 mg/dL 136(H) 104(H) 85  BUN 6 - 20 mg/dL 14 15 10   Creatinine 0.44 - 1.00 mg/dL 0.84 0.83 0.70  BUN/Creat Ratio 9 - 23 - - 14  Sodium 135 - 145 mmol/L 137 142 139  Potassium 3.5 - 5.1 mmol/L 4.4 2.3(LL) 3.2(L)  Chloride 101 - 111 mmol/L 104 105 98  CO2 22 - 32 mmol/L 26 27 29   Calcium 8.9 - 10.3 mg/dL 7.8(L) 8.5(L) 9.2    Disposition: Discharge disposition: 01-Home or Self Care       Discharge Instructions    Call MD for:  difficulty breathing, headache or visual disturbances   Complete by:  As directed    Call MD for:  extreme fatigue   Complete by:  As directed    Call MD for:  hives   Complete by:  As directed    Call MD for:  persistant dizziness or light-headedness   Complete by:  As directed    Call MD for:  persistant nausea and vomiting   Complete by:  As directed    Call MD for:  redness, tenderness, or signs of infection (pain, swelling, redness, odor or green/yellow discharge around incision site)   Complete by:  As directed    Call MD for:  severe uncontrolled pain   Complete by:  As directed    Call MD for:  temperature >100.4   Complete by:  As directed    Diet - low sodium heart healthy   Complete by:  As directed    Discharge wound care:   Complete  by:  As directed    Remove honeycomb dressing in 1 week   Driving Restrictions   Complete by:  As directed    No driving for 2 weeks   Increase activity slowly   Complete by:  As directed    Lifting restrictions   Complete by:  As directed    No heavy lifting for 6 weeks   Sexual Activity Restrictions   Complete by:  As directed    No sexual activity for 6 weeks     Allergies as of 09/04/2017      Reactions   Other Itching   Reaction to powder in gloves   Vicodin [hydrocodone-acetaminophen] Other (See Comments)   Made her feel "high" Has had dilaudid before and tolerated it well      Medication List     STOP taking these medications   albuterol 108 (90 Base) MCG/ACT inhaler Commonly known as:  PROVENTIL HFA;VENTOLIN HFA   cetirizine 10 MG tablet Commonly known as:  ZYRTEC   diphenhydrAMINE 25 mg capsule Commonly known as:  BENADRYL   phenylephrine 10 MG Tabs tablet Commonly known as:  SUDAFED PE   traMADol 50 MG tablet Commonly known as:  ULTRAM     TAKE these medications   diclofenac 50 MG tablet Commonly known as:  CATAFLAM Take 1 tablet (50 mg total) by mouth 3 (three) times daily. What changed:    when to take this  reasons to take this   docusate sodium 100 MG capsule Commonly known as:  COLACE Take 1 capsule (100 mg total) by mouth 2 (two) times daily.   HYDROmorphone 2 MG tablet Commonly known as:  DILAUDID Take 1 tablet (2 mg total) by mouth every 6 (six) hours as needed for severe pain.   potassium chloride 10 MEQ tablet Commonly known as:  K-DUR,KLOR-CON Take 4 tablets (40 mEq total) by mouth daily. What changed:  medication strength   spironolactone 25 MG tablet Commonly known as:  ALDACTONE Take 0.5 tablets (12.5 mg total) by mouth daily.            Discharge Care Instructions  (From admission, onward)        Start     Ordered   09/04/17 0000  Discharge wound care:    Comments:  Remove honeycomb dressing in 1 week   09/04/17 1744       Signed: Lavonia Drafts 09/04/2017, 5:44 PM

## 2017-09-08 ENCOUNTER — Inpatient Hospital Stay (HOSPITAL_COMMUNITY): Payer: BLUE CROSS/BLUE SHIELD

## 2017-09-08 ENCOUNTER — Inpatient Hospital Stay (HOSPITAL_COMMUNITY)
Admission: AD | Admit: 2017-09-08 | Discharge: 2017-09-09 | Disposition: A | Discharge status: Home / Self Care | Payer: BLUE CROSS/BLUE SHIELD | Source: Ambulatory Visit | Attending: Obstetrics and Gynecology | Admitting: Obstetrics and Gynecology

## 2017-09-08 ENCOUNTER — Encounter (HOSPITAL_COMMUNITY): Payer: Self-pay

## 2017-09-08 DIAGNOSIS — Z9884 Bariatric surgery status: Secondary | ICD-10-CM | POA: Insufficient documentation

## 2017-09-08 DIAGNOSIS — Z90710 Acquired absence of both cervix and uterus: Secondary | ICD-10-CM | POA: Insufficient documentation

## 2017-09-08 DIAGNOSIS — G8918 Other acute postprocedural pain: Secondary | ICD-10-CM

## 2017-09-08 DIAGNOSIS — K429 Umbilical hernia without obstruction or gangrene: Secondary | ICD-10-CM | POA: Diagnosis not present

## 2017-09-08 MED ORDER — KETOROLAC TROMETHAMINE 60 MG/2ML IM SOLN
60.0000 mg | INTRAMUSCULAR | Status: AC
  Administered 2017-09-08: 60 mg via INTRAMUSCULAR
  Filled 2017-09-08: qty 2

## 2017-09-08 MED ORDER — IOPAMIDOL (ISOVUE-300) INJECTION 61%
100.0000 mL | Freq: Once | INTRAVENOUS | Status: AC | PRN
  Administered 2017-09-08: 100 mL via INTRAVENOUS

## 2017-09-08 MED ORDER — IOPAMIDOL (ISOVUE-300) INJECTION 61%
30.0000 mL | INTRAVENOUS | Status: AC
  Administered 2017-09-08: 30 mL via ORAL

## 2017-09-08 NOTE — MAU Provider Note (Signed)
History     CSN: [id number]  Arrival date and time: 09/08/17 8099   First Provider Initiated Contact with Patient 09/08/17 2006      Chief Complaint  Patient presents with  . Vaginal Bleeding   HPI  Ms.  Meagan Dean is a 47 y.o. year old [id number] female s/p TAH on 09/02/17 who presents to MAU reporting passing "large blood clots",  increased abdominal pain, N/V. She was prescribed Dilaudid and diclofenac (was unable to get - on back order) for pain. She last took Dilaudid this morning about 0200. She has not taken anymore today, because "it doesn't really work anyway". She describes that abdominal pain as all across her abdomen, but the lower RT side "feels like something is sharp and pulling from the inside". She also describes "an old blood smell coming from her vagina that is awful". She has not recently had SI. She is to F/U with Dr. Ihor Dow in 2 wks. When she called the [place] office today, it "kept going to the voicemail" and no one ever called her back.  Past Medical History:  Diagnosis Date  . Anemia   . Asthma due to seasonal allergies    MILD-- JUST GOT INHALER-not used yet -allergy induced  . Endometriosis of pelvis   . GERD (gastroesophageal reflux disease)    diet controlled  . H/O hiatal hernia   . Headache    MIGRAINES - none since weight loss surgery  . History of endometriosis 03/28/2017  . History of hypertension    NO ISSUES SINCE WT LOSS AFTER GASTRIC BANDING  . History of kidney stones    surgery to remove  . History of obstructive sleep apnea    DX'D PRIOR TO GASTRIC BANDING  2010 --  NO ISSUES SINCE WT LOSS  . Hypertension 03/28/2017  . Low potassium syndrome    hopsitalized for 3 days  . Right ureteral stone   . Seasonal allergies   . Sinus headache   . Sleep apnea    prior to weight loss surgery, does not have cpap  . Urgency of urination     Past Surgical History:  Procedure Laterality Date  . ABDOMINAL HYSTERECTOMY    . APPENDECTOMY     . bowel obstruction  2017  . CYSTOSCOPY WITH RETROGRADE PYELOGRAM, URETEROSCOPY AND STENT PLACEMENT Right 06/18/2013   Procedure: CYSTOSCOPY WITH RETROGRADE PYELOGRAM, URETEROSCOPY AND STENT PLACEMENT;  Surgeon: Franchot Gallo, MD;  Location: [facility];  Service: Urology;  Laterality: Right;  . CYSTOSCOPY WITH RETROGRADE PYELOGRAM, URETEROSCOPY AND STENT PLACEMENT Right 07/05/2013   Procedure: CYSTOSCOPY , URETEROSCOPY ANDstone extraction;  Surgeon: Franchot Gallo, MD;  Location: [facility];  Service: Urology;  Laterality: Right;  . EXPLORATORY LAPARTOMY/  MYOMECTOMY  2005  . HYSTERECTOMY ABDOMINAL WITH SALPINGECTOMY Bilateral 09/02/2017   Procedure: HYSTERECTOMY ABDOMINAL WITH SALPINGECTOMY;  Surgeon: Lavonia Drafts, MD;  Location: [facility];  Service: Gynecology;  Laterality: Bilateral;  . LAPAROSCOPIC APPENDECTOMY N/A 07/28/2014   Procedure: APPENDECTOMY LAPAROSCOPIC;  Surgeon: Jackolyn Confer, MD;  Location: [facility];  Service: General;  Laterality: N/A;  . LAPAROSCOPIC GASTRIC BANDING  06-06-2008  . LAPAROSCOPY /  OVARIAN CYSTECTOMY/  LASER ABLATION ENDOMETRIOSIS  2003  . LAPAROTOMY N/A 11/24/2015   Procedure: EXPLORATORY LAPAROTOMY;  Surgeon: Georganna Skeans, MD;  Location: [facility];  Service: General;  Laterality: N/A;  . LYSIS OF ADHESION N/A 11/24/2015   Procedure: LYSIS OF ADHESION;  Surgeon: Georganna Skeans, MD;  Location: [place];  Service: General;  Laterality: N/A;  . LYSIS OF ADHESION N/A 09/02/2017   Procedure: LYSIS OF ADHESION;  Surgeon: Lavonia Drafts, MD;  Location: [facility];  Service: Gynecology;  Laterality: N/A;  . wisdom teeth ext      Family History  Problem Relation Age of Onset  . Cancer Maternal Grandmother        colon/liver  . Stroke Father     Social History   Tobacco Use  . Smoking status: Never Smoker  . Smokeless tobacco: Never Used  Substance Use Topics  . Alcohol use: Yes    Alcohol/week: 0.0 oz    Comment: occasionally  .  Drug use: No    Allergies:  Allergies  Allergen Reactions  . Other Itching    Reaction to powder in gloves  . Vicodin [Hydrocodone-Acetaminophen] Other (See Comments)    Made her feel "high"  Has had dilaudid before and tolerated it well    Medications Prior to Admission  Medication Sig Dispense Refill Last Dose  . albuterol (VENTOLIN HFA) 108 (90 Base) MCG/ACT inhaler 1-2 inhalations every 4-6 hours as needed for wheezing. Dispense spacer as needed.   prn  . cetirizine (ZYRTEC) 10 MG tablet Take 10 mg by mouth daily as needed for allergies or rhinitis.    prn  . docusate sodium (COLACE) 100 MG capsule Take 1 capsule (100 mg total) by mouth 2 (two) times daily. 10 capsule 0 Past Week at Unknown time  . HYDROmorphone (DILAUDID) 2 MG tablet Take 1 tablet (2 mg total) by mouth every 6 (six) hours as needed for severe pain. 30 tablet 0 09/08/2017 at 0300  . potassium chloride SA (K-DUR,KLOR-CON) 10 MEQ tablet Take 4 tablets (40 mEq total) by mouth daily. (Patient taking differently: Take 10 mEq by mouth 3 (three) times daily. ) 30 tablet 0 09/07/2017 at Unknown time  . pseudoephedrine (SUDAFED) 120 MG 12 hr tablet Take 120 mg by mouth every 12 (twelve) hours as needed for congestion.   Past Month at Unknown time  . diclofenac (CATAFLAM) 50 MG tablet Take 1 tablet (50 mg total) by mouth 3 (three) times daily. (Patient not taking: Reported on 09/08/2017) 60 tablet 1 Not Taking at Unknown time  . spironolactone (ALDACTONE) 25 MG tablet Take 0.5 tablets (12.5 mg total) by mouth daily. (Patient not taking: Reported on 08/21/2017) 30 tablet 3 Not Taking at Unknown time  . traMADol (ULTRAM) 50 MG tablet Take 50 mg by mouth every 6 (six) hours as needed for moderate pain or severe pain.   Not Taking at Unknown time    Review of Systems  Constitutional: Positive for fatigue.  HENT: Negative.   Eyes: Negative.   Respiratory: Negative.   Gastrointestinal: Positive for abdominal pain, nausea and  vomiting.  Endocrine:       "having hot flashes"  Genitourinary: Positive for pelvic pain (entire lower pelvis area, esp. RT side; "feels sharp and pulling").  Musculoskeletal: Negative.   Skin: Negative.   Allergic/Immunologic: Negative.   Neurological: Negative.   Hematological: Negative.   Psychiatric/Behavioral: Negative.    Physical Exam   Blood pressure 106/81, pulse 73, temperature 99.1 F (37.3 C), resp. rate 20, height 5\' 2"  (1.575 m), weight 163 lb 12 oz (74.3 kg).  Physical Exam  Constitutional: She is oriented to person, place, and time. She appears well-developed and well-nourished.  HENT:  Head: Normocephalic and atraumatic.  Eyes: Pupils are equal, round, and reactive to light.  Neck: Normal range of motion.  Cardiovascular: Normal rate  and regular rhythm.  Murmur heard. Respiratory: Effort normal and breath sounds normal.  GI: Soft. Bowel sounds are decreased.  Genitourinary:  Genitourinary Comments: Uterus: surgically absent, SE: vaginal cuff is well visualized on the RT, but not the LT d/t pt's significant discomfort with exam, cuff intact and healing well, small amt of dark, red blood with 2 small clots removed with large cotton swabs, (++) RT adnexal tenderness, mild LT adnexal tenderness  Musculoskeletal: Normal range of motion.  Neurological: She is alert and oriented to person, place, and time.  Skin: Skin is warm and dry.  Psychiatric: She has a normal mood and affect. Her behavior is normal. Judgment and thought content normal.    MAU Course  Procedures  MDM CBC w/Diff Speculum Exam Toradol 60 mg -- improved pain TVUS Non-OB  *Consult with Dr. Ilda Basset @ 0229 - notified of patient's complaints, assessments, lab, U/S, & CT results, recommended tx plan Rx Augmentin 875-160 mg po BID x 7 days, change NSAID Rx to Mobic, F/U with Dr. Ihor Dow on Monday 5/6 - ok to d/c home  US Pelvis Transvanginal Non-ob (tv Only)  Result Date:  09/08/2017 CLINICAL DATA:  Pain and bleeding after hysterectomy on 09/02/2017. EXAM: ULTRASOUND PELVIS TRANSVAGINAL TECHNIQUE: Transvaginal ultrasound examination of the pelvis was performed including evaluation of the uterus, ovaries, adnexal regions, and pelvic cul-de-sac. COMPARISON:  Ultrasound pelvis 06/12/2017. CT abdomen and pelvis 03/21/2017 FINDINGS: Uterus Uterus is surgically absent. Endometrium Surgically absent. Right ovary Measurements: 1.7 x 1.3 x 1.7 cm. Normal appearance/no adnexal mass. Left ovary Measurements: 3.3 x 2.1 x 2.9 cm. Normal appearance/no adnexal mass. Other findings: There is a heterogeneous complex structure and midline pelvis which has mixed hyperechoic and hypoechoic appearance. Echogenic shadowing structures may represent calcification or tiny gas bubbles. This area measures about 3.5 x 2.9 x 5.2 cm. No flow is demonstrated on the color flow Doppler imaging. No free fluid. IMPRESSION: 1. Surgical absence of the uterus. 2. Normal ultrasound appearance of the ovaries. 3. 5.2 cm max diameter complex structure in the midline pelvis. This may represent postoperative fluid collection such as hematoma or possibly abscess. Suggest CT for further evaluation. Electronically Signed   By: Lucienne Capers M.D.   On: 09/08/2017 21:41   Ct Abdomen Pelvis W Contrast  Result Date: 09/09/2017 CLINICAL DATA:  Bleeding after pelvic surgery last Tuesday. EXAM: CT ABDOMEN AND PELVIS WITH CONTRAST TECHNIQUE: Multidetector CT imaging of the abdomen and pelvis was performed using the standard protocol following bolus administration of intravenous contrast. CONTRAST:  176mL ISOVUE-300 IOPAMIDOL (ISOVUE-300) INJECTION 61% COMPARISON:  Ultrasound pelvis 09/08/2017. CT abdomen and pelvis 03/21/2017 FINDINGS: Lower chest: Lung bases are clear. Hepatobiliary: No focal liver abnormality is seen. No gallstones, gallbladder wall thickening, or biliary dilatation. Pancreas: Unremarkable. No pancreatic ductal  dilatation or surrounding inflammatory changes. Spleen: Normal in size without focal abnormality. Adrenals/Urinary Tract: Adrenal glands are unremarkable. Kidneys are normal, without renal calculi, focal lesion, or hydronephrosis. Bladder is decompressed. Stomach/Bowel: Gastric lap band is present. Dilatation of the distal esophagus. Distal esophagus is fluid-filled. Fluid in the stomach without abnormal distention. Small bowel are mostly decompressed. Scattered gas and stool in the colon without abnormal distention. Surgical absence of the appendix. Anterior pelvic wall midline periumbilical hernia containing small bowel without proximal obstruction. No change since prior study. Vascular/Lymphatic: No significant vascular findings are present. No enlarged abdominal or pelvic lymph nodes. Reproductive: Surgical absence of the uterus. Tiny gas collections demonstrated in the pelvis consistent with history of recent  surgery. There is thickening of the lower rectus abdominus muscle suggesting small intramuscular hematoma, likely postoperative. Mild stranding in the anterior pelvis is likely postoperative. There is no discrete hematoma or collection demonstrated in the pelvis. Multiple pelvic bowel loops are present. Changes seen at previous ultrasound may have represented pelvic small bowel loops. Small amount of free fluid in the pelvis is likely postoperative. Infiltration in the anterior abdominal fat is likely postoperative. Other: Reservoir for gastric lap band demonstrated in the right lower quadrant subcutaneous fat. Musculoskeletal: No acute or significant osseous findings. IMPRESSION: 1. Postoperative changes consistent with recent hysterectomy. Small gas bubbles in the pelvis, small amount of free fluid in the pelvis, infiltration in the anterior pelvic wall fat, and thickening of the rectus abdominus muscles consistent with changes due to recent surgery. No definite loculated hematoma. Abnormality seen at  ultrasound may have represented pelvic small bowel loops. 2. Periumbilical hernia containing small bowel without proximal obstruction, similar to prior study. 3. Gastric lap band with dilated fluid-filled distal esophagus. Electronically Signed   By: Lucienne Capers M.D.   On: 09/09/2017 01:40    Assessment and Plan  Postoperative pain  - Rx for Augmentin 875-160 mg BID x 7 days - Rx for Mobic as prescribed - Continue Dilaudid as prescribed - Discharge home - Patient verbalized an understanding of the plan of care and agrees.    Laury Deep, MSN, CNM 09/09/2017, 8:06 PM

## 2017-09-08 NOTE — MAU Note (Signed)
Pt says she had  Surgery last Tuesday   With Dr Tamala Julian-  For  TAH-    Can see blood in honey comb  Dsg.   Has pad on - 1 small black streak .   P t  Says she has passed   Blood and  Dark red clots    She called Dr Marland Kitchen Quintella Baton answered .    Took morphine   At 0300-   Nothing else through  Day.    Temp at home was 99 .

## 2017-09-09 LAB — CBC WITH DIFFERENTIAL/PLATELET
BASOS ABS: 0 10*3/uL (ref 0.0–0.1)
BASOS PCT: 0 %
Eosinophils Absolute: 0.2 10*3/uL (ref 0.0–0.7)
Eosinophils Relative: 2 %
HEMATOCRIT: 26.8 % — AB (ref 36.0–46.0)
HEMOGLOBIN: 8.6 g/dL — AB (ref 12.0–15.0)
Lymphocytes Relative: 35 %
Lymphs Abs: 2.6 10*3/uL (ref 0.7–4.0)
MCH: 28 pg (ref 26.0–34.0)
MCHC: 32.1 g/dL (ref 30.0–36.0)
MCV: 87.3 fL (ref 78.0–100.0)
MONOS PCT: 2 %
Monocytes Absolute: 0.2 10*3/uL (ref 0.1–1.0)
NEUTROS ABS: 4.5 10*3/uL (ref 1.7–7.7)
NEUTROS PCT: 61 %
Platelets: 395 10*3/uL (ref 150–400)
RBC: 3.07 MIL/uL — AB (ref 3.87–5.11)
RDW: 15.6 % — ABNORMAL HIGH (ref 11.5–15.5)
WBC: 7.5 10*3/uL (ref 4.0–10.5)

## 2017-09-09 MED ORDER — MELOXICAM 15 MG PO TABS: 15.0000 mg | ORAL_TABLET | Freq: Every day | ORAL | 0 refills | Status: DC

## 2017-09-09 MED ORDER — AMOXICILLIN-POT CLAVULANATE 875-125 MG PO TABS: 1.0000 | ORAL_TABLET | Freq: Two times a day (BID) | ORAL | 0 refills | Status: AC

## 2017-09-10 ENCOUNTER — Telehealth: Payer: Self-pay | Admitting: General Practice

## 2017-09-10 ENCOUNTER — Encounter: Payer: Self-pay | Admitting: General Practice

## 2017-09-10 NOTE — Telephone Encounter (Signed)
Called patient to schedule post op appts with Dr. Ihor Dow.  Unable to leave message on VM d/t VM was full.  Letter will be mailed to patient.

## 2017-09-16 ENCOUNTER — Telehealth: Payer: Self-pay

## 2017-09-16 NOTE — Telephone Encounter (Signed)
[place], [phone], called and stated that the pt's incision looks like its opening on two different sides.  Could someone please call the pt.for f/u.

## 2017-09-18 ENCOUNTER — Encounter: Payer: Self-pay | Admitting: Obstetrics & Gynecology

## 2017-09-18 ENCOUNTER — Ambulatory Visit (INDEPENDENT_AMBULATORY_CARE_PROVIDER_SITE_OTHER): Payer: BLUE CROSS/BLUE SHIELD | Admitting: Obstetrics & Gynecology

## 2017-09-18 VITALS — BP 126/89 | HR 80 | Temp 98.6°F | Wt 161.2 lb

## 2017-09-18 DIAGNOSIS — Z9889 Other specified postprocedural states: Secondary | ICD-10-CM

## 2017-09-18 NOTE — Progress Notes (Signed)
History:  47 y.o. G1P0010 here today for her 2 week post op check. Pt is s/p a TAH with bilateral salpingectomy and lysis of adhesions. Pt reports no problems. She is tolerating a reg diet with decreased appetite. Min pain. She is voiding and passing stools without difficulty.   She is worries about an oder from her incision. Pt denies fever or chills.   The following portions of the patient's history were reviewed and updated as appropriate: allergies, current medications, past family history, past medical history, past social history, past surgical history and problem list.  Review of Systems:  Pertinent items are noted in HPI.    Objective:  Physical Exam BP 126/89 (BP Location: Right Arm, Patient Position: Sitting, Cuff Size: Normal)   Pulse 80   Temp 98.6 F (37 C) (Oral)   Wt 161 lb 3.2 oz (73.1 kg)   LMP  (LMP Unknown) Comment: 07/2016  BMI 29.48 kg/m   CONSTITUTIONAL: Well-developed, well-nourished female in no acute distress.  HENT:  Normocephalic, atraumatic EYES: Conjunctivae and EOM are normal. No scleral icterus.  NECK: Normal range of motion SKIN: Skin is warm and dry. No rash noted. Not diaphoretic.No pallor. [place]: Alert and oriented to person, place, and time. Normal coordination.  Lungs: CTA CV: RRR Abd: Soft, nontender and nondistended. Incision clean and dry and intact. There steristrips were still semi attached. These were removed and the incision was cleaned.   Labs and Imaging US Pelvis Transvanginal Non-ob (tv Only)  Result Date: 09/08/2017 CLINICAL DATA:  Pain and bleeding after hysterectomy on 09/02/2017. EXAM: ULTRASOUND PELVIS TRANSVAGINAL TECHNIQUE: Transvaginal ultrasound examination of the pelvis was performed including evaluation of the uterus, ovaries, adnexal regions, and pelvic cul-de-sac. COMPARISON:  Ultrasound pelvis 06/12/2017. CT abdomen and pelvis 03/21/2017 FINDINGS: Uterus Uterus is surgically absent. Endometrium Surgically absent. Right  ovary Measurements: 1.7 x 1.3 x 1.7 cm. Normal appearance/no adnexal mass. Left ovary Measurements: 3.3 x 2.1 x 2.9 cm. Normal appearance/no adnexal mass. Other findings: There is a heterogeneous complex structure and midline pelvis which has mixed hyperechoic and hypoechoic appearance. Echogenic shadowing structures may represent calcification or tiny gas bubbles. This area measures about 3.5 x 2.9 x 5.2 cm. No flow is demonstrated on the color flow Doppler imaging. No free fluid. IMPRESSION: 1. Surgical absence of the uterus. 2. Normal ultrasound appearance of the ovaries. 3. 5.2 cm max diameter complex structure in the midline pelvis. This may represent postoperative fluid collection such as hematoma or possibly abscess. Suggest CT for further evaluation. Electronically Signed   By: Lucienne Capers M.D.   On: 09/08/2017 21:41   Ct Abdomen Pelvis W Contrast  Result Date: 09/09/2017 CLINICAL DATA:  Bleeding after pelvic surgery last Tuesday. EXAM: CT ABDOMEN AND PELVIS WITH CONTRAST TECHNIQUE: Multidetector CT imaging of the abdomen and pelvis was performed using the standard protocol following bolus administration of intravenous contrast. CONTRAST:  145mL ISOVUE-300 IOPAMIDOL (ISOVUE-300) INJECTION 61% COMPARISON:  Ultrasound pelvis 09/08/2017. CT abdomen and pelvis 03/21/2017 FINDINGS: Lower chest: Lung bases are clear. Hepatobiliary: No focal liver abnormality is seen. No gallstones, gallbladder wall thickening, or biliary dilatation. Pancreas: Unremarkable. No pancreatic ductal dilatation or surrounding inflammatory changes. Spleen: Normal in size without focal abnormality. Adrenals/Urinary Tract: Adrenal glands are unremarkable. Kidneys are normal, without renal calculi, focal lesion, or hydronephrosis. Bladder is decompressed. Stomach/Bowel: Gastric lap band is present. Dilatation of the distal esophagus. Distal esophagus is fluid-filled. Fluid in the stomach without abnormal distention. Small bowel are  mostly decompressed. Scattered gas  and stool in the colon without abnormal distention. Surgical absence of the appendix. Anterior pelvic wall midline periumbilical hernia containing small bowel without proximal obstruction. No change since prior study. Vascular/Lymphatic: No significant vascular findings are present. No enlarged abdominal or pelvic lymph nodes. Reproductive: Surgical absence of the uterus. Tiny gas collections demonstrated in the pelvis consistent with history of recent surgery. There is thickening of the lower rectus abdominus muscle suggesting small intramuscular hematoma, likely postoperative. Mild stranding in the anterior pelvis is likely postoperative. There is no discrete hematoma or collection demonstrated in the pelvis. Multiple pelvic bowel loops are present. Changes seen at previous ultrasound may have represented pelvic small bowel loops. Small amount of free fluid in the pelvis is likely postoperative. Infiltration in the anterior abdominal fat is likely postoperative. Other: Reservoir for gastric lap band demonstrated in the right lower quadrant subcutaneous fat. Musculoskeletal: No acute or significant osseous findings. IMPRESSION: 1. Postoperative changes consistent with recent hysterectomy. Small gas bubbles in the pelvis, small amount of free fluid in the pelvis, infiltration in the anterior pelvic wall fat, and thickening of the rectus abdominus muscles consistent with changes due to recent surgery. No definite loculated hematoma. Abnormality seen at ultrasound may have represented pelvic small bowel loops. 2. Periumbilical hernia containing small bowel without proximal obstruction, similar to prior study. 3. Gastric lap band with dilated fluid-filled distal esophagus. Electronically Signed   By: Lucienne Capers M.D.   On: 09/09/2017 01:40   09/02/2017 Diagnosis Uterus, cervix and bilateral fallopian tubes CERVIX: UNREMARKABLE. ENDOMETRIUM: BENIGN SECRETORY TYPE  ENDOMETRIUM. MYOMETRIUM: LEIOMYOMATA SEROSA: ENDOMETRIOSIS. - SEROSAL ADHESIONS. BILATERAL FALLOPIAN TUBES: UNREMARKABLE. Assessment & Plan:  2 week post op check after TAH with bilateral salpingectomy- Pt is doing well.  gradual increase in activities  Reviewed surg path  Keep incision clean and dry and covered under pannus.   F/u in 4 weeks or sooner prn  Nothing per vagina.   Jenniah Bhavsar L. Harraway-Smith, M.D., Cherlynn June

## 2017-09-19 NOTE — Telephone Encounter (Signed)
Patient was seen in clinic yesterday 09/18/17

## 2017-09-22 ENCOUNTER — Encounter: Payer: Self-pay | Admitting: Obstetrics & Gynecology

## 2017-09-23 ENCOUNTER — Other Ambulatory Visit: Payer: Self-pay

## 2017-09-23 ENCOUNTER — Telehealth: Payer: Self-pay | Admitting: Obstetrics & Gynecology

## 2017-09-23 MED ORDER — FLUCONAZOLE 150 MG PO TABS: 150.0000 mg | ORAL_TABLET | Freq: Once | ORAL | 0 refills | Status: AC

## 2017-09-23 NOTE — Telephone Encounter (Signed)
Pt had surgery and is now having a yeast infection. Please call in medication to [facility] on [address].

## 2017-09-23 NOTE — Telephone Encounter (Signed)
Patient would like a diflucan called into her pharmacy. Patient notified of medication being called in.

## 2017-10-15 ENCOUNTER — Ambulatory Visit (INDEPENDENT_AMBULATORY_CARE_PROVIDER_SITE_OTHER): Payer: BLUE CROSS/BLUE SHIELD | Admitting: Obstetrics & Gynecology

## 2017-10-15 ENCOUNTER — Encounter: Payer: Self-pay | Admitting: Obstetrics & Gynecology

## 2017-10-15 VITALS — BP 119/86 | HR 86 | Resp 16 | Wt 155.9 lb

## 2017-10-15 DIAGNOSIS — E876 Hypokalemia: Secondary | ICD-10-CM

## 2017-10-15 DIAGNOSIS — R5383 Other fatigue: Secondary | ICD-10-CM

## 2017-10-15 DIAGNOSIS — Z9889 Other specified postprocedural states: Secondary | ICD-10-CM

## 2017-10-15 NOTE — Progress Notes (Signed)
History:  47 y.o. G1P0010 here today for post op check. She is s/p TAH with bilateral salpingectomy on 09/02/2017. She reports feeling well. She is tol a reg diet. She is voiding and passing stools without difficulty.  Her pain is gone. She reports occ hot flushes that are bearable. Pt does report significant fatigue and this am dizziness.   Pt with a h/o hypokalemia.    The following portions of the patient's history were reviewed and updated as appropriate: allergies, current medications, past family history, past medical history, past social history, past surgical history and problem list.  Review of Systems:  Pertinent items are noted in HPI.    Objective:  Physical Exam Blood pressure 119/86, pulse 86, resp. rate 16, weight 155 lb 14.4 oz (70.7 kg).  CONSTITUTIONAL: Well-developed, well-nourished female in no acute distress.  HENT:  Normocephalic, atraumatic EYES: Conjunctivae and EOM are normal. No scleral icterus.  NECK: Normal range of motion SKIN: Skin is warm and dry. No rash noted. Not diaphoretic.No pallor. [place]: Alert and oriented to person, place, and time. Normal coordination.  Abd: Soft, nontender and nondistended; incision c/d/i Pelvic: Normal appearing external genitalia; normal appearing vaginal mucosa and cuff.  Normal discharge. No adnexal tenderness or masses.   Labs and Imaging 09/02/2017 Diagnosis Uterus, cervix and bilateral fallopian tubes CERVIX: UNREMARKABLE. ENDOMETRIUM: BENIGN SECRETORY TYPE ENDOMETRIUM. MYOMETRIUM: LEIOMYOMATA SEROSA: ENDOMETRIOSIS. - SEROSAL ADHESIONS. BILATERAL FALLOPIAN TUBES: UNREMARKABLE.  Assessment & Plan:  6 week post op check. Pt is doing well.   Gradual return to full activites  F/u in 3 months or sooner prn  Labs: CBC, BMP  Rimsha Trembley L. Harraway-Smith, M.D., Cherlynn June

## 2017-10-16 ENCOUNTER — Encounter: Payer: Self-pay | Admitting: Obstetrics & Gynecology

## 2017-10-16 LAB — BASIC METABOLIC PANEL
BUN/Creatinine Ratio: 14 (ref 9–23)
BUN: 14 mg/dL (ref 6–24)
CHLORIDE: 97 mmol/L (ref 96–106)
CO2: 23 mmol/L (ref 20–29)
Calcium: 9.8 mg/dL (ref 8.7–10.2)
Creatinine, Ser: 1.03 mg/dL — ABNORMAL HIGH (ref 0.57–1.00)
GFR calc Af Amer: 75 mL/min/{1.73_m2} (ref >59)
GFR, EST NON AFRICAN AMERICAN: 65 mL/min/{1.73_m2} (ref >59)
GLUCOSE: 87 mg/dL (ref 65–99)
POTASSIUM: 2.7 mmol/L — AB (ref 3.5–5.2)
SODIUM: 138 mmol/L (ref 134–144)

## 2017-10-16 LAB — CBC
HEMATOCRIT: 38.1 % (ref 34.0–46.6)
Hemoglobin: 12.4 g/dL (ref 11.1–15.9)
MCH: 28.6 pg (ref 26.6–33.0)
MCHC: 32.5 g/dL (ref 31.5–35.7)
MCV: 88 fL (ref 79–97)
PLATELETS: 461 10*3/uL — AB (ref 150–450)
RBC: 4.34 x10E6/uL (ref 3.77–5.28)
RDW: 14.8 % (ref 12.3–15.4)
WBC: 8.8 10*3/uL (ref 3.4–10.8)

## 2017-10-16 MED ORDER — POTASSIUM CHLORIDE CRYS ER 10 MEQ PO TBCR: 40.0000 meq | EXTENDED_RELEASE_TABLET | Freq: Two times a day (BID) | ORAL | 0 refills | Status: DC

## 2017-10-16 NOTE — Addendum Note (Signed)
Addended by: Lavonia Drafts on: 10/16/2017 11:57 PM   Modules accepted: Orders

## 2017-10-17 ENCOUNTER — Telehealth: Payer: Self-pay

## 2017-10-17 NOTE — Telephone Encounter (Signed)
Patient called and made aware of low potassium level. Patient made aware that she need to take potassium choloride twice a day for four days.   Also made patient aware that she need to follow up with primary care provider next week. Patient states understanding. Kathrene Alu RN

## 2017-10-17 NOTE — Telephone Encounter (Signed)
Left message for patient to return call to office. Jorita Bohanon  RN 

## 2017-10-17 NOTE — Telephone Encounter (Signed)
-----   Message from Lavonia Drafts, MD sent at 10/16/2017 11:57 PM EDT ----- Please call this pt. She is a [facility] pt but, she is post op. I need her reached today.  Her K+ is extremely low.  She needs to take Potassium choloride 50mEq bid for 4 days. She needs to f/u with her primary care provider next week!  Thx,  clh-S

## 2018-08-18 ENCOUNTER — Other Ambulatory Visit: Payer: Self-pay

## 2018-08-18 ENCOUNTER — Ambulatory Visit (INDEPENDENT_AMBULATORY_CARE_PROVIDER_SITE_OTHER): Payer: Self-pay | Admitting: Internal Medicine

## 2018-08-18 ENCOUNTER — Encounter: Payer: Self-pay | Admitting: Internal Medicine

## 2018-08-18 DIAGNOSIS — J45909 Unspecified asthma, uncomplicated: Secondary | ICD-10-CM

## 2018-08-18 MED ORDER — PREDNISONE 20 MG PO TABS: 40.0000 mg | ORAL_TABLET | Freq: Every day | ORAL | 0 refills | Status: AC

## 2018-08-18 MED ORDER — FLUTICASONE FUROATE-VILANTEROL 100-25 MCG/INH IN AEPB: 1.0000 | INHALATION_SPRAY | Freq: Every day | RESPIRATORY_TRACT | Status: DC

## 2018-08-18 NOTE — Assessment & Plan Note (Addendum)
   A: asthma due to seasonal allergies: She reports some post nasal drip with cough, itchy watery eyes, nasal congestion seems to happen around this time of year, began about one-two weeks ago.  A lot of mucous production, no fever or chills.  No sick contacts.  Not fatigued or feeling ill. Breathing felt a little tight and has some wheezing at night.  Seems to be worse when she interacts with the pollen outside.  So far she has been taking benzonate, mucinex and old prednisone and old amoxicillin which she took for two days.  She ran out of this last week.  She is using albuterol at least 3x per day for the last few days.    P: start pt on Breo given sample from clinic, prescribed short course of prednisone for pt to use if Breo fails (pt with hysterectomy no teratogenic risk)  Return precautions given   As always, pt is advised that if symptoms worsen or new symptoms arise, they should go to an urgent care facility or to to ER for further evaluation.

## 2018-08-18 NOTE — Progress Notes (Signed)
Medicine attending: Medical history, presenting problems,  and medications, reviewed with resident physician Dr Guadlupe Spanish on the day of the patient telephone conversation and I concur with his evaluation and management plan. No suspicion for Covid infection.

## 2018-08-18 NOTE — Progress Notes (Signed)
   [facility] Telephone Encounter  Reason for call:   This telephone encounter was created for 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  Sinus issues, asthma and allergy issues triggered by pollen.  Speaks in full sentences without getting short of breath ROS: Pulmonary: pt reports some wheezing and some night time dyspnea which makes it difficult to get to sleep Cardiac: pt denies palpitations, chest pain,   Abdominal: pt denies abdominal pain, nausea, vomiting, or diarrhea   Assessment / Plan / Recommendations:   A: asthma due to seasonal allergies: She reports some post nasal drip with cough, itchy watery eyes, nasal congestion seems to happen around this time of year, began about one-two weeks ago.  A lot of mucous production, no fever or chills.  No sick contacts.  Not fatigued or feeling ill. Breathing felt a little tight and has some wheezing at night.  Seems to be worse when she interacts with the pollen outside.  So far she has been taking benzonate, mucinex and old prednisone and old amoxicillin which she took for two days.  She ran out of this last week.  She is using albuterol at least 3x per day for the last few days.    P: start pt on Breo given sample from clinic, prescribed short course of prednisone for pt to use if Breo fails (pt with hysterectomy no teratogenic risk)  Return precautions given   As always, pt is advised that if symptoms worsen or new symptoms arise, they should go to an urgent care facility or to to ER for further evaluation.   Consent and Medical Decision Making:   Patient discussed with Dr. Beryle Beams  This is a telephone encounter between Meagan Dean and Meagan Dean on 08/18/2018 for asthma due to seasonal allergies The visit was conducted with the  patient located at home and Meagan Dean at [facility]. The patient's identity was confirmed using their DOB and current address. The patient has consented to being evaluated through a telephone encounter and understands the associated risks (an examination cannot be done and the patient may need to come in for an appointment) / benefits (allows the patient to remain at home, decreasing exposure to coronavirus). I personally spent 15 minutes on medical discussion.

## 2018-11-02 ENCOUNTER — Encounter: Payer: Self-pay | Admitting: Internal Medicine

## 2018-11-02 ENCOUNTER — Ambulatory Visit: Payer: Self-pay | Admitting: Internal Medicine

## 2018-11-02 ENCOUNTER — Other Ambulatory Visit: Payer: Self-pay

## 2018-11-02 VITALS — BP 126/98 | HR 79 | Temp 98.8°F | Ht 62.0 in | Wt 155.8 lb

## 2018-11-02 DIAGNOSIS — Z90710 Acquired absence of both cervix and uterus: Secondary | ICD-10-CM

## 2018-11-02 DIAGNOSIS — Z87442 Personal history of urinary calculi: Secondary | ICD-10-CM

## 2018-11-02 DIAGNOSIS — R109 Unspecified abdominal pain: Secondary | ICD-10-CM

## 2018-11-02 DIAGNOSIS — E876 Hypokalemia: Secondary | ICD-10-CM

## 2018-11-02 DIAGNOSIS — R319 Hematuria, unspecified: Secondary | ICD-10-CM

## 2018-11-02 DIAGNOSIS — Z114 Encounter for screening for human immunodeficiency virus [HIV]: Secondary | ICD-10-CM

## 2018-11-02 DIAGNOSIS — Z0000 Encounter for general adult medical examination without abnormal findings: Secondary | ICD-10-CM

## 2018-11-02 DIAGNOSIS — R350 Frequency of micturition: Secondary | ICD-10-CM

## 2018-11-02 DIAGNOSIS — Z8742 Personal history of other diseases of the female genital tract: Secondary | ICD-10-CM

## 2018-11-02 DIAGNOSIS — R300 Dysuria: Secondary | ICD-10-CM

## 2018-11-02 LAB — URINALYSIS, MICROSCOPIC (REFLEX): RBC / HPF: >50 RBC/hpf (ref 0–5)

## 2018-11-02 LAB — POCT URINALYSIS DIPSTICK
Glucose, UA: NEGATIVE
Leukocytes, UA: NEGATIVE
Nitrite, UA: NEGATIVE
Protein, UA: POSITIVE — AB
Spec Grav, UA: >=1.03 — AB (ref 1.010–1.025)
Urobilinogen, UA: 1 E.U./dL
pH, UA: 6 (ref 5.0–8.0)

## 2018-11-02 LAB — URINALYSIS, ROUTINE W REFLEX MICROSCOPIC
Glucose, UA: NEGATIVE mg/dL
Ketones, ur: 15 mg/dL — AB
Leukocytes,Ua: NEGATIVE
Nitrite: POSITIVE — AB
Protein, ur: 100 mg/dL — AB
Specific Gravity, Urine: >1.03 — ABNORMAL HIGH (ref 1.005–1.030)
pH: 5.5 (ref 5.0–8.0)

## 2018-11-02 MED ORDER — SULFAMETHOXAZOLE-TRIMETHOPRIM 800-160 MG PO TABS: 1.0000 | ORAL_TABLET | Freq: Two times a day (BID) | ORAL | 0 refills | Status: AC

## 2018-11-02 NOTE — Progress Notes (Signed)
   CC: Blood in Urine, Hypokalemia   HPI:  [name] is a 48 y.o. F with PMHx listed below presenting for Blood in Urine, Hypokalemia. Please see the A&P for the status of the patient's chronic medical problems.  Past Medical History:  Diagnosis Date  . Anemia   . Asthma due to seasonal allergies    MILD-- JUST GOT INHALER-not used yet -allergy induced  . Endometriosis of pelvis   . GERD (gastroesophageal reflux disease)    diet controlled  . H/O hiatal hernia   . Headache    MIGRAINES - none since weight loss surgery  . History of endometriosis 03/28/2017  . History of hypertension    NO ISSUES SINCE WT LOSS AFTER GASTRIC BANDING  . History of kidney stones    surgery to remove  . History of obstructive sleep apnea    DX'D PRIOR TO GASTRIC BANDING  2010 --  NO ISSUES SINCE WT LOSS  . Hypertension 03/28/2017  . Low potassium syndrome    hopsitalized for 3 days  . Right ureteral stone   . Seasonal allergies   . Sinus headache   . Sleep apnea    prior to weight loss surgery, does not have cpap  . Urgency of urination    Review of Systems:  Performed and all others negative.  Physical Exam:  Vitals:   11/02/18 0924  BP: (!) 134/95  Pulse: 83  Temp: 98.8 F (37.1 C)  TempSrc: Oral  SpO2: 100%  Weight: 155 lb 12.8 oz (70.7 kg)  Height: 5\' 2"  (1.575 m)   Physical Exam Constitutional:      General: She is not in acute distress.    Appearance: Normal appearance.  Cardiovascular:     Rate and Rhythm: Normal rate and regular rhythm.     Pulses: Normal pulses.     Heart sounds: Normal heart sounds.  Pulmonary:     Effort: Pulmonary effort is normal. No respiratory distress.     Breath sounds: Normal breath sounds.  Abdominal:     General: Bowel sounds are normal. There is no distension.     Palpations: Abdomen is soft.  Musculoskeletal:        General: No swelling or deformity.     Comments: Mild flank tenderness on palpation  Skin:    General: Skin is  warm and dry.  Neurological:     General: No focal deficit present.     Mental Status: Mental status is at baseline.    Assessment & Plan:   See Encounters Tab for problem based charting.  Patient discussed with Dr. Angelia Mould

## 2018-11-02 NOTE — Assessment & Plan Note (Signed)
Patient presenting for 5 weeks of hematuria. She has, during this time, noted clots intermittently. She reports increased urinary frequency and some dysuria. She has intermittent flank pain (different from prior renal stone pain) that is worse during urination. She has a history of renal stones (s/p stent and lithotripsy in 2015), Fibroid (s/p hysterectomy 2019), and endometriosis (s/p ex lap and treatment in 2017). She reports some vaginal bleeding following sex or masturbation, but this has been chronic since her hysterectomy.  Given her urinary symptoms, will treat for UTI despite neg Nitrites or Leuk on Urine Dip. Will obtain formal U/A and Cx for this. Will also send for CT Hematuria study to evaluate for alternative causes such as renal stone, bladder mass, etc given her more complicated history, extent and length of bleeding. She endorse some intermittent lightheadedness so will check CBC as well. If no source is found may need furth OB/GYN W/U given history of hysterectomy and bleeding with intercourse. - Bactrim DS BID x3 Days - U/A, Ur Cx - CBC - CT Hematuria Workup

## 2018-11-02 NOTE — Assessment & Plan Note (Signed)
Patient request screening for HIV given she has a new partner

## 2018-11-02 NOTE — Assessment & Plan Note (Signed)
Patient has a history of hypokalemia. Last BMP was about 1 year ago and K was 2.7 at that time. This appears to be around the time of a surgery that the patient reports they had her replete her K before the procedure. She has not been taking any consistent K supplementation. Denies muscle cramps. - BMP

## 2018-11-02 NOTE — Patient Instructions (Signed)
Thank you for allowing [place] to care for you  For your Hematuria - We are checking urine labs, blood work, and sending for CT scan - Will give you a short course of antibiotic to treat for possible infection  Will check labs for potassium and HIV

## 2018-11-03 ENCOUNTER — Telehealth: Payer: Self-pay | Admitting: Internal Medicine

## 2018-11-03 DIAGNOSIS — E876 Hypokalemia: Secondary | ICD-10-CM

## 2018-11-03 LAB — CBC WITH DIFFERENTIAL/PLATELET
Basophils Absolute: 0.1 10*3/uL (ref 0.0–0.2)
Basos: 1 %
EOS (ABSOLUTE): 0.1 10*3/uL (ref 0.0–0.4)
Eos: 1 %
Hematocrit: 36.4 % (ref 34.0–46.6)
Hemoglobin: 12 g/dL (ref 11.1–15.9)
Immature Grans (Abs): 0 10*3/uL (ref 0.0–0.1)
Immature Granulocytes: 0 %
Lymphocytes Absolute: 2.2 10*3/uL (ref 0.7–3.1)
Lymphs: 33 %
MCH: 28.5 pg (ref 26.6–33.0)
MCHC: 33 g/dL (ref 31.5–35.7)
MCV: 87 fL (ref 79–97)
Monocytes Absolute: 0.4 10*3/uL (ref 0.1–0.9)
Monocytes: 6 %
Neutrophils Absolute: 3.9 10*3/uL (ref 1.4–7.0)
Neutrophils: 59 %
Platelets: 348 10*3/uL (ref 150–450)
RBC: 4.21 x10E6/uL (ref 3.77–5.28)
RDW: 13.6 % (ref 11.7–15.4)
WBC: 6.5 10*3/uL (ref 3.4–10.8)

## 2018-11-03 LAB — BMP8+ANION GAP
Anion Gap: 21 mmol/L — ABNORMAL HIGH (ref 10.0–18.0)
BUN/Creatinine Ratio: 7 — ABNORMAL LOW (ref 9–23)
BUN: 5 mg/dL — ABNORMAL LOW (ref 6–24)
CO2: 23 mmol/L (ref 20–29)
Calcium: 9.6 mg/dL (ref 8.7–10.2)
Chloride: 96 mmol/L (ref 96–106)
Creatinine, Ser: 0.74 mg/dL (ref 0.57–1.00)
GFR calc Af Amer: 112 mL/min/{1.73_m2} (ref >59)
GFR calc non Af Amer: 97 mL/min/{1.73_m2} (ref >59)
Glucose: 89 mg/dL (ref 65–99)
Potassium: 2.8 mmol/L — ABNORMAL LOW (ref 3.5–5.2)
Sodium: 140 mmol/L (ref 134–144)

## 2018-11-03 LAB — HIV ANTIBODY (ROUTINE TESTING W REFLEX): HIV Screen 4th Generation wRfx: NONREACTIVE

## 2018-11-03 MED ORDER — POTASSIUM CHLORIDE CRYS ER 10 MEQ PO TBCR: 40.0000 meq | EXTENDED_RELEASE_TABLET | Freq: Two times a day (BID) | ORAL | 0 refills | Status: DC

## 2018-11-03 NOTE — Telephone Encounter (Signed)
Patient's Meagan Dean came back at 2.8. She was called and informed of this and told to start potassium supplementation again. She will need to take 58mEq BID for 4 days then 76mEq daily after that. She is to return for lab visit in about 2 weeks.  Patient also informed that her CBC was normal and her U/A showed bacteria and crystals consistent with stones.  She states she has noticed some worsening of her pain, which makes her suspect she is passing a stone, she is taking some leftover opioids from a prior prescription. She states she does not need more pain medication at this time. She is advised to drink plenty of fluids and call if she needs pain medication. - PO Potassium 21mEq BID for 3 days, then 20mEq Daily - Return for lab visit in about 1-2 weeks. (she is to call to schedule)

## 2018-11-03 NOTE — Assessment & Plan Note (Signed)
Patient's K came back at 2.8. She was called and informed of this and told to start potassium supplementation again. She will need to take 86mEq BID for 4 days then 58mEq daily after that. She is to return for lab visit in about 2 weeks. - PO Potassium 92mEq BID for 4 days, then 39mEq Daily - Return for lab visit in about 2 weeks.

## 2018-11-04 LAB — URINE CULTURE

## 2018-11-04 NOTE — Progress Notes (Signed)
Internal Medicine Clinic Attending  Case discussed with Dr. Melvin  at the time of the visit.  We reviewed the resident's history and exam and pertinent patient test results.  I agree with the assessment, diagnosis, and plan of care documented in the resident's note.  

## 2018-11-09 ENCOUNTER — Encounter: Payer: Self-pay | Admitting: *Deleted

## 2018-11-11 ENCOUNTER — Ambulatory Visit (HOSPITAL_COMMUNITY): Payer: Self-pay

## 2018-11-11 ENCOUNTER — Ambulatory Visit (HOSPITAL_COMMUNITY)
Admission: RE | Admit: 2018-11-11 | Discharge: 2018-11-11 | Disposition: A | Discharge status: Home / Self Care | Payer: Self-pay | Source: Ambulatory Visit | Attending: Internal Medicine | Admitting: Internal Medicine

## 2018-11-11 ENCOUNTER — Other Ambulatory Visit: Payer: Self-pay

## 2018-11-11 DIAGNOSIS — R319 Hematuria, unspecified: Secondary | ICD-10-CM | POA: Insufficient documentation

## 2018-11-11 DIAGNOSIS — R109 Unspecified abdominal pain: Secondary | ICD-10-CM | POA: Insufficient documentation

## 2018-11-11 MED ORDER — IOHEXOL 300 MG/ML  SOLN
100.0000 mL | Freq: Once | INTRAMUSCULAR | Status: AC | PRN
  Administered 2018-11-11: 100 mL via INTRAVENOUS

## 2018-11-16 ENCOUNTER — Encounter: Payer: Self-pay | Admitting: Internal Medicine

## 2018-11-16 ENCOUNTER — Ambulatory Visit: Payer: Self-pay | Admitting: Internal Medicine

## 2018-11-16 ENCOUNTER — Other Ambulatory Visit: Payer: Self-pay

## 2018-11-16 VITALS — BP 130/90 | HR 83 | Temp 98.6°F | Ht 62.0 in | Wt 158.4 lb

## 2018-11-16 DIAGNOSIS — Z87442 Personal history of urinary calculi: Secondary | ICD-10-CM

## 2018-11-16 DIAGNOSIS — E876 Hypokalemia: Secondary | ICD-10-CM

## 2018-11-16 DIAGNOSIS — Z9884 Bariatric surgery status: Secondary | ICD-10-CM

## 2018-11-16 DIAGNOSIS — R1033 Periumbilical pain: Secondary | ICD-10-CM

## 2018-11-16 DIAGNOSIS — N921 Excessive and frequent menstruation with irregular cycle: Secondary | ICD-10-CM

## 2018-11-16 DIAGNOSIS — R110 Nausea: Secondary | ICD-10-CM

## 2018-11-16 DIAGNOSIS — Z9089 Acquired absence of other organs: Secondary | ICD-10-CM

## 2018-11-16 DIAGNOSIS — R109 Unspecified abdominal pain: Secondary | ICD-10-CM

## 2018-11-16 DIAGNOSIS — Z9889 Other specified postprocedural states: Secondary | ICD-10-CM

## 2018-11-16 DIAGNOSIS — R319 Hematuria, unspecified: Secondary | ICD-10-CM

## 2018-11-16 DIAGNOSIS — Z8742 Personal history of other diseases of the female genital tract: Secondary | ICD-10-CM

## 2018-11-16 DIAGNOSIS — Z90710 Acquired absence of both cervix and uterus: Secondary | ICD-10-CM

## 2018-11-16 MED ORDER — ONDANSETRON HCL 4 MG PO TABS: 4.0000 mg | ORAL_TABLET | Freq: Every day | ORAL | 0 refills | Status: DC | PRN

## 2018-11-16 NOTE — Progress Notes (Signed)
   CC: hematuria   HPI:  [name] is a 48 y.o. with PMH of chronic hypokalemia, endometriosis, laparoscopic gastric banding 2010, nephrolithiasis with stent placement and lithotripsy in 2015, ruptured appendix with lap appendectomy 2017, SBO with laparotomy 2017, total hysterectomy with salpingectomy,  presenting for follow-up of ongoing hematuria with vaginal spotting. She continues to have lower abdominal pain, spotting and hematuria. Her dysuria has resolved after completing the bactrim. She had one episode of incontinence prior to her last appointment but this has not occurred again. Her pain increases when she feels she needs to urinate and with sex or masturbation. She describes it as a pressure in her lower abdomen in addition to cramping in her vaginal canal. She also endorses ongoing intermittent nausea which she sometimes takes benadryl for. Lately her nausea has been even worse and she has been eating less. She denies changes in bowel movements and has been going regularly.   Please see A&P for assessment of the patient's acute and chronic medical conditions.    Past Medical History:  Diagnosis Date  . Anemia   . Asthma due to seasonal allergies    MILD-- JUST GOT INHALER-not used yet -allergy induced  . Endometriosis of pelvis   . GERD (gastroesophageal reflux disease)    diet controlled  . H/O hiatal hernia   . Headache    MIGRAINES - none since weight loss surgery  . History of endometriosis 03/28/2017  . History of hypertension    NO ISSUES SINCE WT LOSS AFTER GASTRIC BANDING  . History of kidney stones    surgery to remove  . History of obstructive sleep apnea    DX'D PRIOR TO GASTRIC BANDING  2010 --  NO ISSUES SINCE WT LOSS  . Hypertension 03/28/2017  . Low potassium syndrome    hopsitalized for 3 days  . Right ureteral stone   . Seasonal allergies   . Sinus headache   . Sleep apnea    prior to weight loss surgery, does not have cpap  . Urgency of  urination    Review of Systems:   Review of Systems  Constitutional: Negative for chills, fever, malaise/fatigue and weight loss.  Cardiovascular: Negative for palpitations and leg swelling.  Gastrointestinal: Positive for abdominal pain and nausea. Negative for blood in stool, constipation, diarrhea and vomiting.  Genitourinary: Positive for flank pain and hematuria. Negative for dysuria, frequency and urgency.  Neurological: Negative for dizziness and weakness.   Physical Exam:  Constitution: NAD, appears stated age  Cardio: RRR, no LE edema  Respiratory: non-labored breathing, on room air Abdominal: diffusely TTP especially suprapubically, superficial area suprapubically that is hard, nodular and tender, also in the lower lateral regions msk vs. Subcutaneous nodules soft, non-distended, +BS GU: no gross blood or discharge, no inguinal hernia on valsalva Skin: c/d/i   Vitals:   11/16/18 1056  BP: 130/90  Pulse: 83  Temp: 98.6 F (37 C)  TempSrc: Oral  SpO2: 98%  Weight: 158 lb 6.4 oz (71.8 kg)  Height: 5\' 2"  (1.575 m)     Assessment & Plan:   See Encounters Tab for problem based charting.  Patient seen with Dr. Dareen Piano

## 2018-11-16 NOTE — Assessment & Plan Note (Signed)
Hx of endometriosis, laparoscopic gastric banding 2010, nephrolithiasis with stent placement and lithotripsy in 2015, ruptured appendix with lap appendectomy 2017, SBO with laparotomy 2017, total hysterectomy with salpingectomy,  presenting for follow-up of ongoing hematuria with vaginal spotting. She continues to have lower abdominal pain, spotting and hematuria. Her dysuria has resolved after completing the bactrim. She had one episode of incontinence prior to her last appointment but this has not occurred again. Her pain increases when she feels she needs to urinate and with sex or masturbation. She describes it as a pressure in her lower abdomen in addition to cramping in her vaginal canal. She also endorses ongoing intermittent nausea which she sometimes takes benadryl for. Lately her nausea has been even worse and she has been eating less. She denies changes in bowel movements and has been going regularly.  After her last appoint in June UA showed hematuria and proteinuria. GFR and creatinine were wnl and CT abdomen did not show nephrolithasis or other acute findings. On pelvic exam she had no gross observable blood but had abdominal and vaginal pain on exam. No mass or swellings noted. On abdominal exam she was diffusely tender.     - repeat UA today  - recommended close follow-up with her gynecologist, Dr. Ihor Dow, will likely need referral to urology vs urogyn - f/u one month for repeat UA if continued hematura

## 2018-11-16 NOTE — Assessment & Plan Note (Signed)
She has a history of hypokalemia and previously was off her potassium supplementation prior to last appointement. K 2.8 on 11/02/2018 and she was started on 40 mEq K-DUR bid for three days then 1 tablet daily. She has continued taking her potassium multiple times a day   - repeat BMP - will f/u with results to titrate her K-dur

## 2018-11-16 NOTE — Assessment & Plan Note (Signed)
She has been having ongoing nausea and abdominal pain in addition to hematuria (see hematuria below). Benadryl helps her nausea sometimes but she has been taking less of this as it makes her tired. She has not vomited but has been eating less as a result of her nausea. Recent CT abdomen showed no acute findings.   - zofran prn nausea.

## 2018-11-16 NOTE — Patient Instructions (Addendum)
Thank you for allowing [place] to provide your care today. Today we discussed your hematuria, abdominal pain, and low potassium.     I have ordered the following labs for you:  BMP, Urinalysis    I will call if any are abnormal.    Please take zofran 4 mg every 8 hours as needed for nausea.   Please call Dr. Ihor Dow for a follow-up appointment.   Please follow-up in one month to recheck your urine. If your labs are normal today you may follow up as needed.    Should you have any questions or concerns please call the internal medicine clinic at [phone].

## 2018-11-17 ENCOUNTER — Other Ambulatory Visit: Payer: Self-pay | Admitting: Internal Medicine

## 2018-11-17 DIAGNOSIS — R319 Hematuria, unspecified: Secondary | ICD-10-CM

## 2018-11-17 DIAGNOSIS — E876 Hypokalemia: Secondary | ICD-10-CM

## 2018-11-17 LAB — URINALYSIS, ROUTINE W REFLEX MICROSCOPIC
Bilirubin, UA: NEGATIVE
Glucose, UA: NEGATIVE
Nitrite, UA: NEGATIVE
Specific Gravity, UA: >=1.03 — AB (ref 1.005–1.030)
Urobilinogen, Ur: 1 mg/dL (ref 0.2–1.0)
pH, UA: 5 (ref 5.0–7.5)

## 2018-11-17 LAB — BMP8+ANION GAP
Anion Gap: 15 mmol/L (ref 10.0–18.0)
BUN/Creatinine Ratio: 18 (ref 9–23)
BUN: 13 mg/dL (ref 6–24)
CO2: 23 mmol/L (ref 20–29)
Calcium: 9.6 mg/dL (ref 8.7–10.2)
Chloride: 102 mmol/L (ref 96–106)
Creatinine, Ser: 0.71 mg/dL (ref 0.57–1.00)
GFR calc Af Amer: 117 mL/min/{1.73_m2} (ref >59)
GFR calc non Af Amer: 102 mL/min/{1.73_m2} (ref >59)
Glucose: 71 mg/dL (ref 65–99)
Potassium: 3.6 mmol/L (ref 3.5–5.2)
Sodium: 140 mmol/L (ref 134–144)

## 2018-11-17 LAB — MICROSCOPIC EXAMINATION
Bacteria, UA: NONE SEEN
Casts: NONE SEEN /lpf
Epithelial Cells (non renal): NONE SEEN /hpf (ref 0–10)
RBC, Urine: >30 /hpf — AB (ref 0–2)

## 2018-11-17 LAB — PROTEIN / CREATININE RATIO, URINE
Creatinine, Urine: 322.5 mg/dL
Protein, Ur: 55.2 mg/dL
Protein/Creat Ratio: 171 mg/g creat (ref 0–200)

## 2018-11-17 MED ORDER — POTASSIUM CHLORIDE CRYS ER 20 MEQ PO TBCR: 40.0000 meq | EXTENDED_RELEASE_TABLET | Freq: Two times a day (BID) | ORAL | 0 refills | Status: DC

## 2018-11-18 NOTE — Addendum Note (Signed)
Addended by: Truddie Crumble on: 11/18/2018 08:39 AM   Modules accepted: Orders

## 2018-11-18 NOTE — Addendum Note (Signed)
Addended by: Truddie Crumble on: 11/18/2018 08:41 AM   Modules accepted: Orders

## 2018-11-19 ENCOUNTER — Telehealth: Payer: Self-pay | Admitting: Internal Medicine

## 2018-11-19 LAB — SPECIMEN STATUS REPORT

## 2018-11-19 LAB — MAGNESIUM: Magnesium: 2.4 mg/dL — ABNORMAL HIGH (ref 1.6–2.3)

## 2018-11-19 NOTE — Telephone Encounter (Signed)
Entered in error

## 2018-11-19 NOTE — Progress Notes (Signed)
Internal Medicine Clinic Attending  Case discussed with Dr. Seawell at the time of the visit.  We reviewed the resident's history and exam and pertinent patient test results.  I agree with the assessment, diagnosis, and plan of care documented in the resident's note.    

## 2018-12-03 ENCOUNTER — Other Ambulatory Visit: Payer: Self-pay

## 2018-12-03 ENCOUNTER — Telehealth (INDEPENDENT_AMBULATORY_CARE_PROVIDER_SITE_OTHER): Payer: Self-pay | Admitting: Obstetrics & Gynecology

## 2018-12-03 ENCOUNTER — Encounter: Payer: Self-pay | Admitting: Obstetrics & Gynecology

## 2018-12-03 DIAGNOSIS — N930 Postcoital and contact bleeding: Secondary | ICD-10-CM

## 2018-12-03 NOTE — Progress Notes (Signed)
TELEHEALTH GYNECOLOGY VIRTUAL VIDEO VISIT ENCOUNTER NOTE  Provider location: [facility] [employer] at [facility]   I connected with Meagan Dean on 12/03/18 at  3:15 PM EDT by MyChart Video Encounter at home and verified that I am speaking with the correct person using two identifiers.   I discussed the limitations, risks, security and privacy concerns of performing an evaluation and management service virtually and the availability of in person appointments. I also discussed with the patient that there may be a patient responsible charge related to this service. The patient expressed understanding and agreed to proceed.   History:  Meagan Dean is a 48 y.o. G72P0010 female being evaluated today for eval of pelvic pain. Pt is s/p hyst 09/02/2017. Pt reports bleeding after intercourse. She reports that pain was noted after intercourse.     Past Medical History:  Diagnosis Date  . Anemia   . ASCUS with positive high risk HPV cervical 03/15/2017  . Asthma due to seasonal allergies    MILD-- JUST GOT INHALER-not used yet -allergy induced  . Calculus of ureter 06/16/2013  . Endometriosis of pelvis   . Fibroids 09/02/2017  . GERD (gastroesophageal reflux disease)    diet controlled  . H/O hiatal hernia   . Headache    MIGRAINES - none since weight loss surgery  . History of endometriosis 03/28/2017  . History of hypertension    NO ISSUES SINCE WT LOSS AFTER GASTRIC BANDING  . History of kidney stones    surgery to remove  . History of obstructive sleep apnea    DX'D PRIOR TO GASTRIC BANDING  2010 --  NO ISSUES SINCE WT LOSS  . Hypertension 03/28/2017  . Low potassium syndrome    hopsitalized for 3 days  . Right ureteral stone   . Seasonal allergies   . Sinus headache   . Sleep apnea    prior to weight loss surgery, does not have cpap  . Urgency of urination    Past Surgical History:  Procedure Laterality Date  . ABDOMINAL HYSTERECTOMY    . APPENDECTOMY    . bowel  obstruction  2017  . CYSTOSCOPY WITH RETROGRADE PYELOGRAM, URETEROSCOPY AND STENT PLACEMENT Right 06/18/2013   Procedure: CYSTOSCOPY WITH RETROGRADE PYELOGRAM, URETEROSCOPY AND STENT PLACEMENT;  Surgeon: Franchot Gallo, MD;  Location: [facility];  Service: Urology;  Laterality: Right;  . CYSTOSCOPY WITH RETROGRADE PYELOGRAM, URETEROSCOPY AND STENT PLACEMENT Right 07/05/2013   Procedure: CYSTOSCOPY , URETEROSCOPY ANDstone extraction;  Surgeon: Franchot Gallo, MD;  Location: [facility];  Service: Urology;  Laterality: Right;  . EXPLORATORY LAPARTOMY/  MYOMECTOMY  2005  . HYSTERECTOMY ABDOMINAL WITH SALPINGECTOMY Bilateral 09/02/2017   Procedure: HYSTERECTOMY ABDOMINAL WITH SALPINGECTOMY;  Surgeon: Lavonia Drafts, MD;  Location: [facility];  Service: Gynecology;  Laterality: Bilateral;  . LAPAROSCOPIC APPENDECTOMY N/A 07/28/2014   Procedure: APPENDECTOMY LAPAROSCOPIC;  Surgeon: Jackolyn Confer, MD;  Location: [facility];  Service: General;  Laterality: N/A;  . LAPAROSCOPIC GASTRIC BANDING  06-06-2008  . LAPAROSCOPY /  OVARIAN CYSTECTOMY/  LASER ABLATION ENDOMETRIOSIS  2003  . LAPAROTOMY N/A 11/24/2015   Procedure: EXPLORATORY LAPAROTOMY;  Surgeon: Georganna Skeans, MD;  Location: [facility];  Service: General;  Laterality: N/A;  . LYSIS OF ADHESION N/A 11/24/2015   Procedure: LYSIS OF ADHESION;  Surgeon: Georganna Skeans, MD;  Location: [place];  Service: General;  Laterality: N/A;  . LYSIS OF ADHESION N/A 09/02/2017   Procedure: LYSIS OF ADHESION;  Surgeon: Lavonia Drafts,  MD;  Location: [facility];  Service: Gynecology;  Laterality: N/A;  . wisdom teeth ext     The following portions of the patient's history were reviewed and updated as appropriate: allergies, current medications, past family history, past medical history, past social history, past surgical history and problem list.    Review of Systems:  Pertinent items noted in HPI and remainder of comprehensive ROS otherwise negative.   Physical Exam:   General:  Alert, oriented and cooperative. Patient appears to be in no acute distress.  Mental Status: Normal mood and affect. Normal behavior. Normal judgment and thought content.   Respiratory: Normal respiratory effort, no problems with respiration noted  Rest of physical exam deferred due to type of encounter  Labs and Imaging No results found for this or any previous visit (from the past 336 hour(s)). Ct Hematuria Workup  Result Date: 11/11/2018 CLINICAL DATA:  Gross hematuria with passage of clots. History of nephrolithiasis and hysterectomy. EXAM: CT ABDOMEN AND PELVIS WITHOUT AND WITH CONTRAST TECHNIQUE: Multidetector CT imaging of the abdomen and pelvis was performed following the standard protocol before and following the bolus administration of intravenous contrast. CONTRAST:  124mL OMNIPAQUE IOHEXOL 300 MG/ML  SOLN COMPARISON:  09/08/2017 CT abdomen/pelvis. FINDINGS: Lower chest: No significant pulmonary nodules or acute consolidative airspace disease. Patulous lower thoracic esophagus with fluid level. Hepatobiliary: Normal liver size. No liver mass. Normal gallbladder with no radiopaque cholelithiasis. No biliary ductal dilatation. Pancreas: Normal, with no mass or duct dilation. Spleen: Normal size. No mass. Adrenals/Urinary Tract: Normal adrenals. No renal stones. No hydronephrosis. Subcentimeter hypodense renal cortical lesion in the medial upper left kidney is too small to characterize and is stable, considered benign. No additional renal cortical lesions. Normal caliber ureters. No ureteral stones. On delayed imaging, there is no urothelial wall thickening and there are no filling defects in the opacified portions of the bilateral collecting systems or ureters. Punctate calcification in the midline anterior superior bladder wall is unchanged, compatible with remote insult. No bladder stones, wall thickening, masses or diverticula. Stomach/Bowel: Well-positioned  gastric band in the region of the gastric cardia, with intact tubing connecting to subcutaneous port in the ventral right abdominal wall. Stomach is nondistended. No acute gastric abnormality. Small umbilical hernia contains a small bowel loop, unchanged. No small bowel dilatation or wall thickening. No focal small bowel caliber transition. Appendectomy. Normal large bowel with no diverticulosis, large bowel wall thickening or pericolonic fat stranding. Vascular/Lymphatic: Normal caliber abdominal aorta. Patent portal, splenic, hepatic and renal veins. No pathologically enlarged lymph nodes in the abdomen or pelvis. Reproductive: Status post hysterectomy, with no abnormal findings at the vaginal cuff. No adnexal mass. Other: No pneumoperitoneum, ascites or focal fluid collection. Musculoskeletal: No aggressive appearing focal osseous lesions. Minimal lumbar spondylosis. IMPRESSION: 1. No CT findings to explain the hematuria. No urolithiasis. No hydronephrosis. No suspicious renal cortical masses. No evidence of urothelial lesions. 2. Well-positioned gastric band. Patulous lower thoracic esophagus with fluid level. 3. Chronic small umbilical hernia contains a small bowel loop, with no acute bowel complication. Electronically Signed   By: Ilona Sorrel M.D.   On: 11/11/2018 17:19       Assessment and Plan:     Post coital bleeding and pelvic pain s/p hyst  rec OV for pelvic exam   Chart reviewed.        I discussed the assessment and treatment plan with the patient. The patient was provided an opportunity to ask questions and all were answered. The  patient agreed with the plan and demonstrated an understanding of the instructions.   The patient was advised to call back or seek an in-person evaluation/go to the ED if the symptoms worsen or if the condition fails to improve as anticipated.  I provided 11 minutes of face-to-face time during this encounter.   Lavonia Drafts, MD [facility] [facility], [place]

## 2018-12-14 ENCOUNTER — Ambulatory Visit: Payer: Self-pay

## 2019-01-06 ENCOUNTER — Ambulatory Visit (INDEPENDENT_AMBULATORY_CARE_PROVIDER_SITE_OTHER): Payer: Self-pay | Admitting: Obstetrics & Gynecology

## 2019-01-06 ENCOUNTER — Other Ambulatory Visit: Payer: Self-pay

## 2019-01-06 ENCOUNTER — Encounter: Payer: Self-pay | Admitting: Obstetrics & Gynecology

## 2019-01-06 VITALS — BP 138/86 | HR 55 | Ht 62.0 in | Wt 156.1 lb

## 2019-01-06 DIAGNOSIS — R3129 Other microscopic hematuria: Secondary | ICD-10-CM

## 2019-01-06 DIAGNOSIS — R102 Pelvic and perineal pain: Secondary | ICD-10-CM

## 2019-01-06 LAB — POCT URINALYSIS DIPSTICK
Glucose, UA: NEGATIVE
Leukocytes, UA: NEGATIVE
Nitrite, UA: NEGATIVE
Protein, UA: POSITIVE — AB
Spec Grav, UA: >=1.03 — AB (ref 1.010–1.025)
pH, UA: 7 (ref 5.0–8.0)

## 2019-01-06 MED ORDER — TRAMADOL HCL 50 MG PO TABS: 50.0000 mg | ORAL_TABLET | Freq: Four times a day (QID) | ORAL | 0 refills | Status: DC | PRN

## 2019-01-06 NOTE — Progress Notes (Signed)
History:  48 y.o. G1P0010 here today for eval of post coital bleeding. She is  TAH with bilateral salpingectomy. She had been evaluated for kidney stones and had a negative workup.   She also reports that she noted bleeding after voiding. She denies pain with intercourse, just the bleeding. She reports that she has noted it with self stimulation and intercourse.   She has started to have pelvic pain that is becoming progressively worse and not improved with OTC meds.    The following portions of the patient's history were reviewed and updated as appropriate: allergies, current medications, past family history, past medical history, past social history, past surgical history and problem list.  Review of Systems:  Pertinent items are noted in HPI.    Objective:  Physical Exam Blood pressure 138/86, pulse (!) 55, height 5\' 2"  (1.575 m), weight 156 lb 1.9 oz (70.8 kg).  CONSTITUTIONAL: Well-developed, well-nourished female in no acute distress.  HENT:  Normocephalic, atraumatic EYES: Conjunctivae and EOM are normal. No scleral icterus.  NECK: Normal range of motion SKIN: Skin is warm and dry. No rash noted. Not diaphoretic.No pallor. [place]: Alert and oriented to person, place, and time. Normal coordination.  Abd: Soft, nontender and nondistended; no masses noted.  Pelvic: Normal appearing external genitalia; normal appearing vaginal mucosa and cervix.  Normal discharge. No adnexal masses palpated.  Labs and Imaging UA: +++ hematuria  09/08/2017 CLINICAL DATA:  Pain and bleeding after hysterectomy on 09/02/2017.  EXAM: ULTRASOUND PELVIS TRANSVAGINAL  TECHNIQUE: Transvaginal ultrasound examination of the pelvis was performed including evaluation of the uterus, ovaries, adnexal regions, and pelvic cul-de-sac.  COMPARISON:  Ultrasound pelvis 06/12/2017. CT abdomen and pelvis 03/21/2017  FINDINGS: Uterus  Uterus is surgically absent.  Endometrium  Surgically  absent.  Right ovary  Measurements: 1.7 x 1.3 x 1.7 cm. Normal appearance/no adnexal mass.  Left ovary  Measurements: 3.3 x 2.1 x 2.9 cm. Normal appearance/no adnexal mass.  Other findings: There is a heterogeneous complex structure and midline pelvis which has mixed hyperechoic and hypoechoic appearance. Echogenic shadowing structures may represent calcification or tiny gas bubbles. This area measures about 3.5 x 2.9 x 5.2 cm. No flow is demonstrated on the color flow Doppler imaging. No free fluid.  IMPRESSION: 1. Surgical absence of the uterus. 2. Normal ultrasound appearance of the ovaries. 3. 5.2 cm max diameter complex structure in the midline pelvis. This may represent postoperative fluid collection such as hematoma or possibly abscess. Suggest CT for further evaluation.  09/08/2017 CLINICAL DATA:  Bleeding after pelvic surgery last Tuesday.  EXAM: CT ABDOMEN AND PELVIS WITH CONTRAST  TECHNIQUE: Multidetector CT imaging of the abdomen and pelvis was performed using the standard protocol following bolus administration of intravenous contrast.  CONTRAST:  125mL ISOVUE-300 IOPAMIDOL (ISOVUE-300) INJECTION 61%  COMPARISON:  Ultrasound pelvis 09/08/2017. CT abdomen and pelvis 03/21/2017  FINDINGS: Lower chest: Lung bases are clear.  Hepatobiliary: No focal liver abnormality is seen. No gallstones, gallbladder wall thickening, or biliary dilatation.  Pancreas: Unremarkable. No pancreatic ductal dilatation or surrounding inflammatory changes.  Spleen: Normal in size without focal abnormality.  Adrenals/Urinary Tract: Adrenal glands are unremarkable. Kidneys are normal, without renal calculi, focal lesion, or hydronephrosis. Bladder is decompressed.  Stomach/Bowel: Gastric lap band is present. Dilatation of the distal esophagus. Distal esophagus is fluid-filled. Fluid in the stomach without abnormal distention. Small bowel are mostly  decompressed. Scattered gas and stool in the colon without abnormal distention. Surgical absence of the appendix. Anterior pelvic wall midline  periumbilical hernia containing small bowel without proximal obstruction. No change since prior study.  Vascular/Lymphatic: No significant vascular findings are present. No enlarged abdominal or pelvic lymph nodes.  Reproductive: Surgical absence of the uterus. Tiny gas collections demonstrated in the pelvis consistent with history of recent surgery. There is thickening of the lower rectus abdominus muscle suggesting small intramuscular hematoma, likely postoperative. Mild stranding in the anterior pelvis is likely postoperative. There is no discrete hematoma or collection demonstrated in the pelvis. Multiple pelvic bowel loops are present. Changes seen at previous ultrasound may have represented pelvic small bowel loops. Small amount of free fluid in the pelvis is likely postoperative. Infiltration in the anterior abdominal fat is likely postoperative.  Other: Reservoir for gastric lap band demonstrated in the right lower quadrant subcutaneous fat.  Musculoskeletal: No acute or significant osseous findings.  IMPRESSION: 1. Postoperative changes consistent with recent hysterectomy. Small gas bubbles in the pelvis, small amount of free fluid in the pelvis, infiltration in the anterior pelvic wall fat, and thickening of the rectus abdominus muscles consistent with changes due to recent surgery. No definite loculated hematoma. Abnormality seen at ultrasound may have represented pelvic small bowel loops. 2. Periumbilical hernia containing small bowel without proximal obstruction, similar to prior study. 3. Gastric lap band with dilated fluid-filled distal esophagus.   11/11/2018 CLINICAL DATA:  Gross hematuria with passage of clots. History of nephrolithiasis and hysterectomy.  EXAM: CT ABDOMEN AND PELVIS WITHOUT AND WITH  CONTRAST  TECHNIQUE: Multidetector CT imaging of the abdomen and pelvis was performed following the standard protocol before and following the bolus administration of intravenous contrast.  CONTRAST:  118mL OMNIPAQUE IOHEXOL 300 MG/ML  SOLN  COMPARISON:  09/08/2017 CT abdomen/pelvis.  FINDINGS: Lower chest: No significant pulmonary nodules or acute consolidative airspace disease. Patulous lower thoracic esophagus with fluid level.  Hepatobiliary: Normal liver size. No liver mass. Normal gallbladder with no radiopaque cholelithiasis. No biliary ductal dilatation.  Pancreas: Normal, with no mass or duct dilation.  Spleen: Normal size. No mass.  Adrenals/Urinary Tract: Normal adrenals. No renal stones. No hydronephrosis. Subcentimeter hypodense renal cortical lesion in the medial upper left kidney is too small to characterize and is stable, considered benign. No additional renal cortical lesions. Normal caliber ureters. No ureteral stones. On delayed imaging, there is no urothelial wall thickening and there are no filling defects in the opacified portions of the bilateral collecting systems or ureters. Punctate calcification in the midline anterior superior bladder wall is unchanged, compatible with remote insult. No bladder stones, wall thickening, masses or diverticula.  Stomach/Bowel: Well-positioned gastric band in the region of the gastric cardia, with intact tubing connecting to subcutaneous port in the ventral right abdominal wall. Stomach is nondistended. No acute gastric abnormality. Small umbilical hernia contains a small bowel loop, unchanged. No small bowel dilatation or wall thickening. No focal small bowel caliber transition. Appendectomy. Normal large bowel with no diverticulosis, large bowel wall thickening or pericolonic fat stranding.  Vascular/Lymphatic: Normal caliber abdominal aorta. Patent portal, splenic, hepatic and renal veins. No  pathologically enlarged lymph nodes in the abdomen or pelvis.  Reproductive: Status post hysterectomy, with no abnormal findings at the vaginal cuff. No adnexal mass.  Other: No pneumoperitoneum, ascites or focal fluid collection.  Musculoskeletal: No aggressive appearing focal osseous lesions. Minimal lumbar spondylosis.  IMPRESSION: 1. No CT findings to explain the hematuria. No urolithiasis. No hydronephrosis. No suspicious renal cortical masses. No evidence of urothelial lesions. 2. Well-positioned gastric band. Patulous lower thoracic esophagus with  fluid level. 3. Chronic small umbilical hernia contains a small bowel loop, with no acute bowel complication.  BMP Latest Ref Rng & Units 11/16/2018 11/02/2018 10/15/2017  Glucose 65 - 99 mg/dL 71 89 87  BUN 6 - 24 mg/dL 13 5(L) 14  Creatinine 0.57 - 1.00 mg/dL 0.71 0.74 1.03(H)  BUN/Creat Ratio 9 - 23 18 7(L) 14  Sodium 134 - 144 mmol/L 140 140 138  Potassium 3.5 - 5.2 mmol/L 3.6 2.8(L) 2.7(L)  Chloride 96 - 106 mmol/L 102 96 97  CO2 20 - 29 mmol/L 23 23 23   Calcium 8.7 - 10.2 mg/dL 9.6 9.6 9.8    Assessment & Plan:  Pelvic pain  Tramadol 50mg  q 6 hours prn #15   Hematuria  Referral to Urology.   Total face-to-face time with patient was 15 min.  Greater than 50% was spent in counseling and coordination of care with the patient.  Valena Ivanov L. Harraway-Smith, M.D., Cherlynn June

## 2019-01-10 ENCOUNTER — Encounter: Payer: Self-pay | Admitting: Obstetrics & Gynecology

## 2019-01-13 ENCOUNTER — Telehealth: Payer: Self-pay

## 2019-01-13 NOTE — Telephone Encounter (Signed)
[facility] (on phone 18 mins) and got patient in for appointment on Thurs. Sept 17th at 2:30 with the resident clinic.   Patient is to bring 13 in case she needs labs done. Kathrene Alu RN  Patient called and made aware of appointment and need to bring $13 with her to appt. Kathrene Alu RN

## 2019-01-19 ENCOUNTER — Other Ambulatory Visit: Payer: Self-pay

## 2019-01-19 ENCOUNTER — Ambulatory Visit (INDEPENDENT_AMBULATORY_CARE_PROVIDER_SITE_OTHER): Payer: Self-pay | Admitting: *Deleted

## 2019-01-19 DIAGNOSIS — Z0000 Encounter for general adult medical examination without abnormal findings: Secondary | ICD-10-CM

## 2019-01-21 LAB — TB SKIN TEST
Induration: 0 mm
TB Skin Test: NEGATIVE

## 2019-03-10 ENCOUNTER — Other Ambulatory Visit: Payer: Self-pay

## 2019-03-10 ENCOUNTER — Telehealth: Payer: Self-pay | Admitting: *Deleted

## 2019-03-10 ENCOUNTER — Ambulatory Visit (INDEPENDENT_AMBULATORY_CARE_PROVIDER_SITE_OTHER): Payer: Self-pay | Admitting: Obstetrics & Gynecology

## 2019-03-10 ENCOUNTER — Encounter: Payer: Self-pay | Admitting: Obstetrics & Gynecology

## 2019-03-10 VITALS — BP 143/105 | HR 77 | Wt 157.0 lb

## 2019-03-10 DIAGNOSIS — R102 Pelvic and perineal pain: Secondary | ICD-10-CM

## 2019-03-10 DIAGNOSIS — R300 Dysuria: Secondary | ICD-10-CM

## 2019-03-10 DIAGNOSIS — R319 Hematuria, unspecified: Secondary | ICD-10-CM

## 2019-03-10 LAB — POCT URINE QUALITATIVE DIPSTICK BLOOD

## 2019-03-10 MED ORDER — TRAMADOL HCL 50 MG PO TABS: 50.0000 mg | ORAL_TABLET | Freq: Four times a day (QID) | ORAL | 0 refills | Status: DC | PRN

## 2019-03-10 NOTE — Telephone Encounter (Signed)
Pt calls from gyn office, stating her bp is elevated and what to do. She is questioned about chest pain, short of breath, h/a. States she has been having those symptoms but has not told anyone. appt made [facility] 10/29 at 1515, states she cannot come in earlier because she must work. She is also offered the urg care this evening. Advised to speak w/ gyn and then decide what action to take, she is agreeable

## 2019-03-10 NOTE — Telephone Encounter (Signed)
Agree. thanks

## 2019-03-10 NOTE — Progress Notes (Signed)
Patient is here to follow up on pelvic pain. Her urine is positive for blood.

## 2019-03-10 NOTE — Progress Notes (Signed)
History:  48 y.o. G1P0010 here today for f/u of pelvic pain and hematuria. Pt is s/p TAH with bilateral salpingectomy 09/02/2017. She was seen in aug for post coital bleeding. She was seen by urology and has a cystoscopy on Thursday. She reports that he is still in a lot of pain. At her last visit here I prescribed Tramadol which the pt was unable to pick up in a timely manner and when she did go to the pharmacy the meds were no longer available.  She was prescribed Mrybetriq by urology. She reports that they could not find a cause for the bleeding.     Pt reports that the hematuria was first noted in 2017.    The following portions of the patient's history were reviewed and updated as appropriate: allergies, current medications, past family history, past medical history, past social history, past surgical history and problem list.  Review of Systems:  Pertinent items are noted in HPI.    Objective:  Physical Exam Blood pressure (!) 143/105, pulse 77, weight 157 lb (71.2 kg).  CONSTITUTIONAL: Well-developed, well-nourished female who appears uncomfortable. Pt is in no acute distress.  HENT:  Normocephalic, atraumatic EYES: Conjunctivae and EOM are normal. No scleral icterus.  NECK: Normal range of motion SKIN: Skin is warm and dry. No rash noted. Not diaphoretic.No pallor. [place]: Alert and oriented to person, place, and time. Normal coordination.  Abd: Soft, diffusely tender. No rebound or guarding.  Pelvic: deferred  Labs and Imaging UA: large amount of blood.   BMP Latest Ref Rng & Units 11/16/2018 11/02/2018 10/15/2017  Glucose 65 - 99 mg/dL 71 89 87  BUN 6 - 24 mg/dL 13 5(L) 14  Creatinine 0.57 - 1.00 mg/dL 0.71 0.74 1.03(H)  BUN/Creat Ratio 9 - 23 18 7(L) 14  Sodium 134 - 144 mmol/L 140 140 138  Potassium 3.5 - 5.2 mmol/L 3.6 2.8(L) 2.7(L)  Chloride 96 - 106 mmol/L 102 96 97  CO2 20 - 29 mmol/L 23 23 23   Calcium 8.7 - 10.2 mg/dL 9.6 9.6 9.8    Assessment & Plan:  Pelvic  pain, dysuria and hematuria- etiology unknown  Need results from urology visit  Tramadol 50mg  1 po q 6 hours prn #20  Referral to nephrology.   Total face-to-face time with patient was 18 min.  Greater than 50% was spent in counseling and coordination of care with the patient.   Meagan Dean, M.D., [place] [place]

## 2019-03-11 ENCOUNTER — Ambulatory Visit (HOSPITAL_COMMUNITY)
Admission: RE | Admit: 2019-03-11 | Discharge: 2019-03-11 | Disposition: A | Discharge status: Home / Self Care | Payer: Self-pay | Source: Ambulatory Visit | Attending: Internal Medicine | Admitting: Internal Medicine

## 2019-03-11 ENCOUNTER — Ambulatory Visit: Payer: Self-pay | Admitting: Internal Medicine

## 2019-03-11 ENCOUNTER — Encounter: Payer: Self-pay | Admitting: Internal Medicine

## 2019-03-11 ENCOUNTER — Other Ambulatory Visit: Payer: Self-pay

## 2019-03-11 VITALS — BP 129/95 | HR 83 | Temp 98.2°F | Ht 62.0 in | Wt 157.4 lb

## 2019-03-11 DIAGNOSIS — R002 Palpitations: Secondary | ICD-10-CM | POA: Insufficient documentation

## 2019-03-11 DIAGNOSIS — I10 Essential (primary) hypertension: Secondary | ICD-10-CM

## 2019-03-11 DIAGNOSIS — E876 Hypokalemia: Secondary | ICD-10-CM

## 2019-03-11 MED ORDER — SPIRONOLACTONE 25 MG PO TABS: 12.5000 mg | ORAL_TABLET | Freq: Every day | ORAL | 1 refills | Status: DC

## 2019-03-11 NOTE — Patient Instructions (Addendum)
Thank you for allowing [place] to care for you  For your high blood pressure - We will restart spironolactone today  For your palpitations - EKG today looks normal - We are checking your electrolytes  For your history of low potassium - We will restart spironolactone - We are checking labs today - We we call you with the results

## 2019-03-11 NOTE — Assessment & Plan Note (Addendum)
BP elevated today at 144/105. She noted it was elevated to [id number] systolic at her gynecology visit yesterday and when she called our clinic reported some intermittent chest pain and shortness of breath. She states this symptoms have been more consistent with intermittent palpitation on interview today. She has been on spironolactone in the past due to dual indication of HTN and hypokalemia, will restart this today. We will also recheck BMP to evaluate potassium level.  - Spironolactone 12.5mg  Daily - BMP  - Spironolactone 12.5mg  Daily

## 2019-03-11 NOTE — Assessment & Plan Note (Addendum)
Patient has history of hypokalemia previously treated with spironolactone, then K-dur. She has not been taking either of these for a while. She reports palpitations recently. We will check EKG, BMP, Mg, and restart Spironolactone (dual indication of hypertension.) - EKG NSR - BMP, Mg - Restart spironolactone  ADDENDUM: Mg WNL, BMP showed K 3.1, will continue with resumption of Spironolactone and recheck as planned. May need K supplementation if K does not improve on Spironolactone.

## 2019-03-11 NOTE — Assessment & Plan Note (Signed)
Patient reports intermittent palpitations. She has a history of low potassium and is off supplementations. EKG in clinic showed NSR. We will recheck BMP and Mg, and restart Spironolactone for hypokalemia and hypertension. - BMP, Mg - Spironolactone 12.5mg  Daily

## 2019-03-11 NOTE — Progress Notes (Signed)
   CC: Hypertension  HPI:  [name] is a 48 y.o. F with PMHx listed below presenting for Hypertension. Please see the A&P for the status of the patient's chronic medical problems.  Past Medical History:  Diagnosis Date  . Anemia   . ASCUS with positive high risk HPV cervical 03/15/2017  . Asthma due to seasonal allergies    MILD-- JUST GOT INHALER-not used yet -allergy induced  . Calculus of ureter 06/16/2013  . Endometriosis of pelvis   . Fibroids 09/02/2017  . GERD (gastroesophageal reflux disease)    diet controlled  . H/O hiatal hernia   . Headache    MIGRAINES - none since weight loss surgery  . History of endometriosis 03/28/2017  . History of hypertension    NO ISSUES SINCE WT LOSS AFTER GASTRIC BANDING  . History of kidney stones    surgery to remove  . History of obstructive sleep apnea    DX'D PRIOR TO GASTRIC BANDING  2010 --  NO ISSUES SINCE WT LOSS  . Hypertension 03/28/2017  . Low potassium syndrome    hopsitalized for 3 days  . Right ureteral stone   . Seasonal allergies   . Sinus headache   . Sleep apnea    prior to weight loss surgery, does not have cpap  . Urgency of urination    Review of Systems:  Performed and all others negative.  Physical Exam:  Vitals:   03/11/19 1346  BP: (!) 144/105  Pulse: 83  Temp: 98.2 F (36.8 C)  TempSrc: Oral  SpO2: 100%  Weight: 157 lb 6.4 oz (71.4 kg)  Height: 5\' 2"  (1.575 m)   Physical Exam Constitutional:      General: She is not in acute distress.    Appearance: Normal appearance.  Cardiovascular:     Rate and Rhythm: Normal rate and regular rhythm.     Pulses: Normal pulses.     Heart sounds: Normal heart sounds.  Pulmonary:     Effort: Pulmonary effort is normal. No respiratory distress.     Breath sounds: Normal breath sounds.  Abdominal:     General: Bowel sounds are normal. There is no distension.     Palpations: Abdomen is soft.     Tenderness: There is no abdominal tenderness.   Musculoskeletal:        General: No swelling or deformity.  Skin:    General: Skin is warm and dry.  Neurological:     General: No focal deficit present.     Mental Status: Mental status is at baseline.    Assessment & Plan:   See Encounters Tab for problem based charting.  Patient discussed with Dr. Daryll Drown

## 2019-03-12 LAB — BMP8+ANION GAP
Anion Gap: 16 mmol/L (ref 10.0–18.0)
BUN/Creatinine Ratio: 13 (ref 9–23)
BUN: 11 mg/dL (ref 6–24)
CO2: 24 mmol/L (ref 20–29)
Calcium: 9.7 mg/dL (ref 8.7–10.2)
Chloride: 103 mmol/L (ref 96–106)
Creatinine, Ser: 0.87 mg/dL (ref 0.57–1.00)
GFR calc Af Amer: 92 mL/min/{1.73_m2} (ref >59)
GFR calc non Af Amer: 80 mL/min/{1.73_m2} (ref >59)
Glucose: 85 mg/dL (ref 65–99)
Potassium: 3.1 mmol/L — ABNORMAL LOW (ref 3.5–5.2)
Sodium: 143 mmol/L (ref 134–144)

## 2019-03-12 LAB — MAGNESIUM: Magnesium: 2.1 mg/dL (ref 1.6–2.3)

## 2019-03-16 NOTE — Progress Notes (Signed)
Internal Medicine Clinic Attending  I saw and evaluated the patient.  I personally confirmed the key portions of the history and exam documented by Dr. Jones and I reviewed pertinent patient test results.  The assessment, diagnosis, and plan were formulated together and I agree with the documentation in the resident's note.     

## 2019-03-17 ENCOUNTER — Telehealth: Payer: Self-pay

## 2019-03-17 NOTE — Telephone Encounter (Signed)
Patient called stating that last night she had a "good amount" of post coital bleeding.  Patient states even today after using the restroom she is seeing some light pink blood when wiping. Patient is concerned about this and wants provider to know. Patient wondering if she should be seen sooner than the end of this month. This has been an ongoing problem for her. Will route to provider for input. Kathrene Alu RN

## 2019-04-07 ENCOUNTER — Ambulatory Visit (INDEPENDENT_AMBULATORY_CARE_PROVIDER_SITE_OTHER): Payer: Self-pay | Admitting: Obstetrics & Gynecology

## 2019-04-07 ENCOUNTER — Other Ambulatory Visit: Payer: Self-pay

## 2019-04-07 ENCOUNTER — Encounter: Payer: Self-pay | Admitting: Obstetrics & Gynecology

## 2019-04-07 VITALS — BP 139/92 | HR 77 | Ht 62.0 in

## 2019-04-07 DIAGNOSIS — N898 Other specified noninflammatory disorders of vagina: Secondary | ICD-10-CM

## 2019-04-07 DIAGNOSIS — R319 Hematuria, unspecified: Secondary | ICD-10-CM

## 2019-04-07 LAB — POCT URINALYSIS DIPSTICK
Appearance: NEGATIVE
Bilirubin, UA: NEGATIVE
Glucose, UA: NEGATIVE
Leukocytes, UA: NEGATIVE
Nitrite, UA: NEGATIVE
Protein, UA: NEGATIVE

## 2019-04-07 NOTE — Progress Notes (Signed)
History:  48 y.o. G1P0010 here today for eval of vaginal itching. She denies using new products in the vaginal area. Pt reports that she was placed on Myrbetriq by the urologist. She is scheduled to f/u with them in 3 months.   The following portions of the patient's history were reviewed and updated as appropriate: allergies, current medications, past family history, past medical history, past social history, past surgical history and problem list.  Review of Systems:  Pertinent items are noted in HPI.    Objective:  Physical Exam Blood pressure (!) 139/92, pulse 77, height 5\' 2"  (1.575 m).  CONSTITUTIONAL: Well-developed, well-nourished female in no acute distress.  HENT:  Normocephalic, atraumatic EYES: Conjunctivae and EOM are normal. No scleral icterus.  NECK: Normal range of motion SKIN: Skin is warm and dry. No rash noted. Not diaphoretic.No pallor. [place]: Alert and oriented to person, place, and time. Normal coordination.  Pelvic: Normal appearing external genitalia; normal appearing vaginal mucosa.   Normal discharge.    UA: amber color; large blood  Assessment & Plan:  Vaginal itching  Affirm sent  Hematuria  referral to Nephrology  They are reviewing her records per Epic  F/u in 3 months or sooner prn  Total face-to-face time with patient was 15 min.  Greater than 50% was spent in counseling and coordination of care with the patient.   Virga Haltiwanger L. Harraway-Smith, M.D., Cherlynn June

## 2019-04-07 NOTE — Addendum Note (Signed)
Addended by: Phill Myron on: 04/07/2019 03:40 PM   Modules accepted: Orders

## 2019-04-07 NOTE — Addendum Note (Signed)
Addended by: Phill Myron on: 04/07/2019 03:50 PM   Modules accepted: Orders

## 2019-04-07 NOTE — Progress Notes (Signed)
Patient states she thinks she may have BV or yeast infection. Patient recently started myrbetriq by urologist. Kathrene Alu RN

## 2019-04-12 ENCOUNTER — Encounter: Payer: Self-pay | Admitting: Internal Medicine

## 2019-04-12 ENCOUNTER — Ambulatory Visit: Payer: Self-pay

## 2019-04-13 ENCOUNTER — Encounter: Payer: Self-pay | Admitting: *Deleted

## 2019-04-13 LAB — CERVICOVAGINAL ANCILLARY ONLY
Bacterial Vaginitis (gardnerella): NEGATIVE
Candida Glabrata: NEGATIVE
Candida Vaginitis: NEGATIVE
Comment: NEGATIVE
Comment: NEGATIVE
Comment: NEGATIVE

## 2019-04-13 NOTE — Progress Notes (Signed)
Fax request received for refill of Augmentin. Per chart review  this Rx was originally given on 09/09/17 following [facility] visit due to post op pain from s/p TAH on 09/02/17. Pt was recently seen on 11/25 @ [facility] office for c/o vaginal itching and had negative wet prep test. Rx refill for Augmentin denied and form faxed to [facility].

## 2019-07-22 ENCOUNTER — Emergency Department (HOSPITAL_COMMUNITY)
Admission: EM | Admit: 2019-07-22 | Discharge: 2019-07-22 | Disposition: A | Discharge status: Home / Self Care | Payer: Self-pay | Attending: Emergency Medicine | Admitting: Emergency Medicine

## 2019-07-22 ENCOUNTER — Encounter (HOSPITAL_COMMUNITY): Payer: Self-pay

## 2019-07-22 ENCOUNTER — Other Ambulatory Visit: Payer: Self-pay

## 2019-07-22 DIAGNOSIS — M7918 Myalgia, other site: Secondary | ICD-10-CM | POA: Insufficient documentation

## 2019-07-22 DIAGNOSIS — Y9389 Activity, other specified: Secondary | ICD-10-CM | POA: Insufficient documentation

## 2019-07-22 DIAGNOSIS — M542 Cervicalgia: Secondary | ICD-10-CM | POA: Insufficient documentation

## 2019-07-22 DIAGNOSIS — J45909 Unspecified asthma, uncomplicated: Secondary | ICD-10-CM | POA: Insufficient documentation

## 2019-07-22 DIAGNOSIS — Y92410 Unspecified street and highway as the place of occurrence of the external cause: Secondary | ICD-10-CM | POA: Insufficient documentation

## 2019-07-22 DIAGNOSIS — Z79899 Other long term (current) drug therapy: Secondary | ICD-10-CM | POA: Insufficient documentation

## 2019-07-22 DIAGNOSIS — Y998 Other external cause status: Secondary | ICD-10-CM | POA: Insufficient documentation

## 2019-07-22 MED ORDER — IBUPROFEN 800 MG PO TABS
800.0000 mg | ORAL_TABLET | Freq: Once | ORAL | Status: AC
  Administered 2019-07-22: 800 mg via ORAL
  Filled 2019-07-22: qty 1

## 2019-07-22 MED ORDER — ACETAMINOPHEN 325 MG PO TABS
650.0000 mg | ORAL_TABLET | Freq: Once | ORAL | Status: AC
  Administered 2019-07-22: 650 mg via ORAL
  Filled 2019-07-22: qty 2

## 2019-07-22 MED ORDER — MELOXICAM 15 MG PO TABS: 15.0000 mg | ORAL_TABLET | Freq: Every day | ORAL | 0 refills | Status: AC
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
## 2019-07-22 NOTE — Discharge Instructions (Addendum)
Home to rest, take meloxicam and Tylenol as needed as directed.  Apply warm compresses to sore muscles.  Recommend gentle range of motion exercises.
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
## 2019-07-29 ENCOUNTER — Encounter: Payer: Self-pay | Admitting: Internal Medicine

## 2019-07-29 ENCOUNTER — Ambulatory Visit (INDEPENDENT_AMBULATORY_CARE_PROVIDER_SITE_OTHER): Payer: Self-pay | Admitting: Internal Medicine

## 2019-07-29 VITALS — BP 128/90 | HR 75 | Temp 98.2°F | Ht 62.0 in | Wt 163.6 lb

## 2019-07-29 DIAGNOSIS — M549 Dorsalgia, unspecified: Secondary | ICD-10-CM

## 2019-07-29 DIAGNOSIS — M546 Pain in thoracic spine: Secondary | ICD-10-CM

## 2019-07-29 DIAGNOSIS — M79642 Pain in left hand: Secondary | ICD-10-CM

## 2019-07-29 DIAGNOSIS — G8911 Acute pain due to trauma: Secondary | ICD-10-CM

## 2019-07-29 DIAGNOSIS — M79641 Pain in right hand: Secondary | ICD-10-CM

## 2019-07-29 MED ORDER — GABAPENTIN 300 MG PO CAPS: 300.0000 mg | ORAL_CAPSULE | Freq: Every day | ORAL | 0 refills | Status: DC

## 2019-07-29 NOTE — Patient Instructions (Signed)
You were seen for evaluation of your back and arm pain. I think it is unlikely that you have any fractures or nerve compressions. You most likely have tendon strains causing your symptoms. Here are my recommendations:  1) Gabapentin 300 mg at night daily. This can help with the numbness/tingling pain  2) Referral has been sent for physical therapy. They will call you to schedule an evaluation  Thank you for allowing [place] to be part of your medical care!

## 2019-07-30 DIAGNOSIS — M549 Dorsalgia, unspecified: Secondary | ICD-10-CM | POA: Insufficient documentation
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
## 2019-08-03 NOTE — Progress Notes (Signed)
Internal Medicine Clinic Attending  Case discussed with Dr. MacLean at the time of the visit.  We reviewed the resident's history and exam and pertinent patient test results.  I agree with the assessment, diagnosis, and plan of care documented in the resident's note.    

## 2019-08-11 ENCOUNTER — Ambulatory Visit: Payer: Self-pay | Attending: Internal Medicine | Admitting: Physical Therapy

## 2019-08-11 ENCOUNTER — Encounter: Payer: Self-pay | Admitting: Physical Therapy

## 2019-08-11 ENCOUNTER — Other Ambulatory Visit: Payer: Self-pay

## 2019-08-11 DIAGNOSIS — M79641 Pain in right hand: Secondary | ICD-10-CM | POA: Insufficient documentation

## 2019-08-11 DIAGNOSIS — M546 Pain in thoracic spine: Secondary | ICD-10-CM | POA: Insufficient documentation
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
## 2019-08-12 ENCOUNTER — Encounter: Payer: Self-pay | Admitting: Internal Medicine

## 2019-08-12 ENCOUNTER — Ambulatory Visit: Payer: Self-pay | Admitting: Internal Medicine

## 2019-08-12 ENCOUNTER — Other Ambulatory Visit (HOSPITAL_COMMUNITY)
Admission: RE | Admit: 2019-08-12 | Discharge: 2019-08-12 | Disposition: A | Discharge status: Home / Self Care | Payer: Self-pay | Source: Ambulatory Visit | Attending: Student in an Organized Health Care Education/Training Program | Admitting: Student in an Organized Health Care Education/Training Program

## 2019-08-12 VITALS — BP 135/92 | HR 79 | Temp 98.7°F | Wt 160.3 lb

## 2019-08-12 DIAGNOSIS — R319 Hematuria, unspecified: Secondary | ICD-10-CM

## 2019-08-12 DIAGNOSIS — N898 Other specified noninflammatory disorders of vagina: Secondary | ICD-10-CM

## 2019-08-12 DIAGNOSIS — N3091 Cystitis, unspecified with hematuria: Secondary | ICD-10-CM

## 2019-08-12 DIAGNOSIS — R42 Dizziness and giddiness: Secondary | ICD-10-CM

## 2019-08-12 DIAGNOSIS — B373 Candidiasis of vulva and vagina: Secondary | ICD-10-CM | POA: Insufficient documentation

## 2019-08-12 DIAGNOSIS — R5383 Other fatigue: Secondary | ICD-10-CM

## 2019-08-12 DIAGNOSIS — R109 Unspecified abdominal pain: Secondary | ICD-10-CM

## 2019-08-12 LAB — POCT URINALYSIS DIPSTICK
Glucose, UA: NEGATIVE
Leukocytes, UA: NEGATIVE
Nitrite, UA: NEGATIVE
Protein, UA: POSITIVE — AB
Spec Grav, UA: >=1.03 — AB (ref 1.010–1.025)
Urobilinogen, UA: 1 E.U./dL
pH, UA: 6 (ref 5.0–8.0)

## 2019-08-12 MED ORDER — NITROFURANTOIN MONOHYD MACRO 100 MG PO CAPS: 100.0000 mg | ORAL_CAPSULE | Freq: Two times a day (BID) | ORAL | 0 refills | Status: AC

## 2019-08-12 NOTE — Assessment & Plan Note (Addendum)
Pt concerned today that she has a bacterial infection. Endorsing vaginal discharge and pain in her vaginal and rectal area. Denies constipation, diarrhea, or straining to have a bowel movement. She is not currently sexually active.   On pelvic exam today, she has minimal white vaginal discharge in the vaginal vault, no mass, erythema, or swelling noted. Endorsed exquisite vaginal pain on speculum insertion. Rectal exam without fissures, palpable hemorrhoids, or masses.  - sent off wet prep, GC/chlamydia, and trich - pt previously recommended for pelvic floor PT by urology, pending testing above pt may benefit from referral there again given continued vaginal/pelvic pain

## 2019-08-12 NOTE — Progress Notes (Signed)
   CC: fatigue  HPI:  Meagan Dean is a 49 y.o. F with significant PMH as outlined below, who presents with multiple complaints including fatigue, dizziness, abdominal pain, and rectal pain. Please see problem-based charting for additional information.   Past Medical History:  Diagnosis Date  . Anemia   . ASCUS with positive high risk HPV cervical 03/15/2017  . Asthma due to seasonal allergies    MILD-- JUST GOT INHALER-not used yet -allergy induced  . Calculus of ureter 06/16/2013  . Endometriosis of pelvis   . Fibroids 09/02/2017  . GERD (gastroesophageal reflux disease)    diet controlled  . H/O hiatal hernia   . Headache    MIGRAINES - none since weight loss surgery  . History of endometriosis 03/28/2017  . History of hypertension    NO ISSUES SINCE WT LOSS AFTER GASTRIC BANDING  . History of kidney stones    surgery to remove  . History of obstructive sleep apnea    DX'D PRIOR TO GASTRIC BANDING  2010 --  NO ISSUES SINCE WT LOSS  . Hypertension 03/28/2017  . Low potassium syndrome    hopsitalized for 3 days  . Right ureteral stone   . Seasonal allergies   . Sinus headache   . Sleep apnea    prior to weight loss surgery, does not have cpap  . Urgency of urination    Review of Systems:   Review of Systems  Constitutional: Positive for malaise/fatigue. Negative for chills and fever.  Cardiovascular: Negative for chest pain.  Gastrointestinal: Positive for abdominal pain. Negative for constipation, diarrhea, nausea and vomiting.  Genitourinary: Positive for dysuria and hematuria. Negative for frequency.  Neurological: Positive for dizziness.  Psychiatric/Behavioral: Negative for depression. The patient is not nervous/anxious and does not have insomnia.    Physical Exam:  Vitals:   08/12/19 1410  BP: (!) 135/92  Pulse: 79  Temp: 98.7 F (37.1 C)  TempSrc: Oral  SpO2: 99%  Weight: 160 lb 4.8 oz (72.7 kg)   Physical Exam Vitals and nursing note reviewed.    Constitutional:      General: She is not in acute distress.    Appearance: Normal appearance. She is well-developed. She is not ill-appearing.  Eyes:     Conjunctiva/sclera: Conjunctivae normal.  Cardiovascular:     Rate and Rhythm: Normal rate and regular rhythm.     Heart sounds: Normal heart sounds.  Pulmonary:     Effort: Pulmonary effort is normal. No respiratory distress.     Breath sounds: Normal breath sounds.  Abdominal:     General: Abdomen is flat. Bowel sounds are normal. There is no distension.     Palpations: Abdomen is soft.     Tenderness: There is no abdominal tenderness.  Genitourinary:    Vagina: Vaginal discharge (minimal white discharge) and tenderness present. No erythema.     Uterus: Absent.      Rectum: Mass and tenderness present.  Skin:    General: Skin is warm and dry.  Neurological:     Mental Status: She is alert.    Assessment & Plan:   See Encounters Tab for problem based charting.  Patient discussed with Dr. Evette Doffing

## 2019-08-12 NOTE — Patient Instructions (Addendum)
Ms. Maro!  It was nice meeting you today!  For your fatigue - Today, we will check some blood work today to look at your blood counts, electrolytes, and thyroid. I will call you with any abnormal results, otherwise we will go over them at your follow-up appointment.  For the blood in your urine - Please take an antibiotic (Marcobid) twice a day for 5 days for a urinary tract infection. We will recheck your urine for blood again in 4 weeks after treatment.  For your vaginal pain - A swab from the exam today has been sent to the lab for testing. I will call you with these results, which will take till next week to come back.  Thank you for letting me be a part of your care

## 2019-08-12 NOTE — Assessment & Plan Note (Signed)
Pt presents with a 3 month history of fatigue and generalized unwell feeling. States she previously had issues with insomnia, but is now requiring five hour energy drinks to complete her work and feels as though she can nap at any point throughout the day. Additionally, has a 2-3 week history of poor appetite. Pt describes episodes of dizziness and intermittent vision changes as well. She does endorse life stressors including working on an additional degree and is tearful recalling the unexpected death of her brother in 05/25/2023. She denies feeling depressed, stating that her physical symptoms are not related to her brothers passing. She speaks with a spiritual mentor regarding this. PHQ-9 today was 18. Orthostatic vital signs - laying HR 80 BP 138/105, sitting HR 79 BP 135/92, and standing HR 95 BP 133/102.   Discussed how physical symptoms can be related to pt's life stressors, and her symptoms could be caused by underlying depression or anxiety. Pt adamant that she is not depressed and something else is going on. Explained to the pt that we would get some blood work today to investigate any underlying anemia, thyroid dysfunction, or electrolyte abnormalities.   - ordered CBC, BMP, and TSH - follow-up in 4 weeks

## 2019-08-12 NOTE — Assessment & Plan Note (Signed)
Pt continues to have persistent hematuria. Endorses some dysuria and pelvic pain. Denies urinary frequency. Has a history of endometriosis, nephrolithiasis with stent placement and lithotripsy in 2015, and total hysterectomy with salpingectomy. Last seen by urology in 02/2019 for persistent microhematuria. At that time, noted to have normal cystoscopy, no evidence of stones or malignancy on imaging, and no infectious source given negative urine cultures. Was prescribed Myrbetriq and pelvic floor PT at that time and referred to nephrology. She took Myrebetriq for 1 month, but did not refill it due to cost. She was seen by nephrology in 06/2019. Pt states nephrology did not feel her hematuria to be glomerular in nature.  UA today with spec gravity >1.030, large RBCs, 100 protein, trace ketones, small bili, negative leukocytes and nitrites.   Assessment - simple cystitis and persistent hematuria   - will treat with 5 day course of Macrobid - repeat UA in 4 weeks - if blood persistent, pt will need referral back to urology for repeat cystoscopy vs CT ureterogram

## 2019-08-13 LAB — TSH: TSH: 1.35 u[IU]/mL (ref 0.450–4.500)

## 2019-08-13 LAB — CERVICOVAGINAL ANCILLARY ONLY
Bacterial Vaginitis (gardnerella): NEGATIVE
Candida Glabrata: NEGATIVE
Candida Vaginitis: POSITIVE — AB
Chlamydia: NEGATIVE
Comment: NEGATIVE
Comment: NEGATIVE
Comment: NEGATIVE
Comment: NEGATIVE
Comment: NEGATIVE
Comment: NORMAL
Neisseria Gonorrhea: NEGATIVE
Trichomonas: NEGATIVE

## 2019-08-13 LAB — BMP8+ANION GAP
Anion Gap: 11 mmol/L (ref 10.0–18.0)
BUN/Creatinine Ratio: 16 (ref 9–23)
BUN: 14 mg/dL (ref 6–24)
CO2: 31 mmol/L — ABNORMAL HIGH (ref 20–29)
Calcium: 9.8 mg/dL (ref 8.7–10.2)
Chloride: 102 mmol/L (ref 96–106)
Creatinine, Ser: 0.88 mg/dL (ref 0.57–1.00)
GFR calc Af Amer: 90 mL/min/{1.73_m2} (ref >59)
GFR calc non Af Amer: 78 mL/min/{1.73_m2} (ref >59)
Glucose: 94 mg/dL (ref 65–99)
Potassium: 3.3 mmol/L — ABNORMAL LOW (ref 3.5–5.2)
Sodium: 144 mmol/L (ref 134–144)

## 2019-08-13 LAB — CBC
Hematocrit: 35.4 % (ref 34.0–46.6)
Hemoglobin: 12.2 g/dL (ref 11.1–15.9)
MCH: 29.8 pg (ref 26.6–33.0)
MCHC: 34.5 g/dL (ref 31.5–35.7)
MCV: 86 fL (ref 79–97)
Platelets: 412 10*3/uL (ref 150–450)
RBC: 4.1 x10E6/uL (ref 3.77–5.28)
RDW: 13.3 % (ref 11.7–15.4)
WBC: 5.5 10*3/uL (ref 3.4–10.8)

## 2019-08-14 LAB — URINE CULTURE: Organism ID, Bacteria: NO GROWTH

## 2019-08-16 ENCOUNTER — Emergency Department (HOSPITAL_COMMUNITY): Payer: Self-pay

## 2019-08-16 ENCOUNTER — Emergency Department (HOSPITAL_COMMUNITY)
Admission: EM | Admit: 2019-08-16 | Discharge: 2019-08-16 | Disposition: A | Discharge status: Home / Self Care | Payer: Self-pay | Attending: Emergency Medicine | Admitting: Emergency Medicine

## 2019-08-16 ENCOUNTER — Encounter (HOSPITAL_COMMUNITY): Payer: Self-pay | Admitting: *Deleted

## 2019-08-16 ENCOUNTER — Other Ambulatory Visit: Payer: Self-pay

## 2019-08-16 DIAGNOSIS — R5383 Other fatigue: Secondary | ICD-10-CM | POA: Insufficient documentation

## 2019-08-16 DIAGNOSIS — R319 Hematuria, unspecified: Secondary | ICD-10-CM | POA: Insufficient documentation

## 2019-08-16 DIAGNOSIS — Z9884 Bariatric surgery status: Secondary | ICD-10-CM | POA: Insufficient documentation

## 2019-08-16 DIAGNOSIS — J45909 Unspecified asthma, uncomplicated: Secondary | ICD-10-CM | POA: Insufficient documentation

## 2019-08-16 DIAGNOSIS — R1084 Generalized abdominal pain: Secondary | ICD-10-CM | POA: Insufficient documentation

## 2019-08-16 DIAGNOSIS — I10 Essential (primary) hypertension: Secondary | ICD-10-CM | POA: Insufficient documentation

## 2019-08-16 DIAGNOSIS — Z79899 Other long term (current) drug therapy: Secondary | ICD-10-CM | POA: Insufficient documentation

## 2019-08-16 LAB — COMPREHENSIVE METABOLIC PANEL WITH GFR
ALT: 10 U/L (ref 0–44)
AST: 17 U/L (ref 15–41)
Albumin: 4 g/dL (ref 3.5–5.0)
Alkaline Phosphatase: 71 U/L (ref 38–126)
Anion gap: 8 (ref 5–15)
BUN: 14 mg/dL (ref 6–20)
CO2: 27 mmol/L (ref 22–32)
Calcium: 9 mg/dL (ref 8.9–10.3)
Chloride: 105 mmol/L (ref 98–111)
Creatinine, Ser: 0.81 mg/dL (ref 0.44–1.00)
GFR calc Af Amer: >60 mL/min (ref >60)
GFR calc non Af Amer: >60 mL/min (ref >60)
Glucose, Bld: 92 mg/dL (ref 70–99)
Potassium: 2.8 mmol/L — ABNORMAL LOW (ref 3.5–5.1)
Sodium: 140 mmol/L (ref 135–145)
Total Bilirubin: 0.8 mg/dL (ref 0.3–1.2)
Total Protein: 7.5 g/dL (ref 6.5–8.1)

## 2019-08-16 LAB — CBG MONITORING, ED: Glucose-Capillary: 92 mg/dL (ref 70–99)

## 2019-08-16 LAB — URINALYSIS, ROUTINE W REFLEX MICROSCOPIC
Bacteria, UA: NONE SEEN
Bilirubin Urine: NEGATIVE
Glucose, UA: NEGATIVE mg/dL
Ketones, ur: 5 mg/dL — AB
Leukocytes,Ua: NEGATIVE
Nitrite: NEGATIVE
Protein, ur: 100 mg/dL — AB
RBC / HPF: >50 RBC/hpf — ABNORMAL HIGH (ref 0–5)
Specific Gravity, Urine: 1.039 — ABNORMAL HIGH (ref 1.005–1.030)
pH: 5 (ref 5.0–8.0)

## 2019-08-16 LAB — CBC
HCT: 36.6 % (ref 36.0–46.0)
Hemoglobin: 12.1 g/dL (ref 12.0–15.0)
MCH: 29.3 pg (ref 26.0–34.0)
MCHC: 33.1 g/dL (ref 30.0–36.0)
MCV: 88.6 fL (ref 80.0–100.0)
Platelets: 353 10*3/uL (ref 150–400)
RBC: 4.13 MIL/uL (ref 3.87–5.11)
RDW: 13.1 % (ref 11.5–15.5)
WBC: 5.5 10*3/uL (ref 4.0–10.5)
nRBC: 0 % (ref 0.0–0.2)

## 2019-08-16 LAB — LIPASE, BLOOD: Lipase: 34 U/L (ref 11–51)

## 2019-08-16 MED ORDER — POTASSIUM CHLORIDE ER 10 MEQ PO TBCR: 10.0000 meq | EXTENDED_RELEASE_TABLET | Freq: Every day | ORAL | 0 refills | Status: DC

## 2019-08-16 MED ORDER — ONDANSETRON HCL 4 MG PO TABS: 4.0000 mg | ORAL_TABLET | Freq: Three times a day (TID) | ORAL | 0 refills | Status: DC | PRN

## 2019-08-16 MED ORDER — SODIUM CHLORIDE 0.9% FLUSH: 3.0000 mL | Freq: Once | INTRAVENOUS | Status: DC

## 2019-08-16 MED ORDER — ONDANSETRON HCL 4 MG/2ML IJ SOLN
4.0000 mg | Freq: Once | INTRAMUSCULAR | Status: AC
  Administered 2019-08-16: 4 mg via INTRAVENOUS
  Filled 2019-08-16: qty 2

## 2019-08-16 MED ORDER — SODIUM CHLORIDE 0.9 % IV BOLUS
1000.0000 mL | Freq: Once | INTRAVENOUS | Status: AC
  Administered 2019-08-16: 1000 mL via INTRAVENOUS

## 2019-08-16 MED ORDER — IOHEXOL 300 MG/ML  SOLN
100.0000 mL | Freq: Once | INTRAMUSCULAR | Status: AC | PRN
  Administered 2019-08-16: 100 mL via INTRAVENOUS

## 2019-08-16 MED ORDER — POTASSIUM CHLORIDE CRYS ER 20 MEQ PO TBCR
40.0000 meq | EXTENDED_RELEASE_TABLET | Freq: Once | ORAL | Status: AC
  Administered 2019-08-16: 40 meq via ORAL
  Filled 2019-08-16: qty 2

## 2019-08-16 MED ORDER — SODIUM CHLORIDE (PF) 0.9 % IJ SOLN
INTRAMUSCULAR | Status: AC
  Filled 2019-08-16: qty 50

## 2019-08-16 NOTE — Discharge Instructions (Addendum)
Seen in the emergency department for evaluation of fatigue,  abdominal pain,  hematuria, lightheaded spells and feeling nauseous with decreased appetite.  You had blood work, EKG, and a CAT scan of your abdomen and pelvis.  The most significant finding was that your potassium was low at 2.8 and we are giving some medication for that.  It would be important that you continue to work with your doctors to try to find answers to your symptoms.  Please return to the emergency department if any worsening or concerning symptoms.

## 2019-08-16 NOTE — ED Notes (Signed)
Patient transported to CT 

## 2019-08-16 NOTE — ED Notes (Signed)
ED Provider at bedside. 

## 2019-08-16 NOTE — ED Triage Notes (Addendum)
Pt comes in with the following complaints for over 2 months: Fatigue Loss of appetite Urinating blood Shooting pain in body Nausea Dizzy spells Hot feeling Multiple other complaints Has been to PCP without finding cause.

## 2019-08-16 NOTE — ED Provider Notes (Signed)
[facility] DEPT Provider Note   CSN: [id number] Arrival date & time: 08/16/19  1602     History Chief Complaint  Patient presents with  . Fatigue  . Loss if appetite  . Nausea  . Multiple complaints    Meagan Dean is a 49 y.o. female.  She is presenting with multiple complaints, which have been going on for 5 years plus.  Most concerning to her is that she has been fatigued for 2 months.  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  She is also had generalized abdominal pain that is been on and off for years.  She feels nauseous and has not wanted to eat.  Her blood pressures been running high.  She had a few dizzy spells like she was going to pass out over the last few days.  She has been urinating blood and that is been going on for at least 5 years and seen multiple specialist for this.  Decreased appetite.  Denies having Covid and has received her Covid vaccine.  No fevers no chills no cough no diarrhea or constipation  The history is provided by the patient.  Weakness Severity:  Severe Onset quality:  Gradual Timing:  Intermittent Progression:  Worsening Chronicity:  New Context: stress   Relieved by:  Nothing Worsened by:  Activity Ineffective treatments:  Rest Associated symptoms: abdominal pain and nausea   Associated symptoms: no chest pain, no cough, no diarrhea, no dysphagia, no dysuria, no fever, no melena, no shortness of breath and no vomiting        Past Medical History:  Diagnosis Date  . Anemia   . ASCUS with positive high risk HPV cervical 03/15/2017  . Asthma due to seasonal allergies    MILD-- JUST GOT INHALER-not used yet -allergy induced  . Calculus of ureter 06/16/2013  . Endometriosis of pelvis   . Fibroids 09/02/2017  . GERD (gastroesophageal reflux disease)    diet controlled  . H/O hiatal hernia   . Headache    MIGRAINES - none since  weight loss surgery  . History of endometriosis 03/28/2017  . History of hypertension    NO ISSUES SINCE WT LOSS AFTER GASTRIC BANDING  . History of kidney stones    surgery to remove  . History of obstructive sleep apnea    DX'D PRIOR TO GASTRIC BANDING  2010 --  NO ISSUES SINCE WT LOSS  . Hypertension 03/28/2017  . Low potassium syndrome    hopsitalized for 3 days  . Right ureteral stone   . Seasonal allergies   . Sinus headache   . Sleep apnea    prior to weight loss surgery, does not have cpap  . Urgency of urination     Patient Active Problem List   Diagnosis Date Noted  . Fatigue 08/12/2019  . Back pain 07/30/2019  . Palpitation 03/11/2019  . Asthma due to seasonal allergies 08/18/2018  . Post-operative state 09/02/2017  . History of endometriosis 03/28/2017  . Hypertension 03/28/2017  . Abdominal pain in female 03/21/2017  . Vaginal discharge 03/07/2017  . Hematuria 02/01/2017  . Urgency of micturition 01/31/2017  . History of obstructive sleep apnea   . Diarrhea 12/15/2015  . History of laparoscopic adjustable gastric banding   . Partial small bowel obstruction ([place]) 11/17/2015  . Healthcare maintenance 06/27/2015  . Routine health maintenance 04/03/2015  . Perforated appendicitis s/p  lap appy 07/28/2014 07/28/2014  . Hypokalemia 06/17/2013    Past Surgical History:  Procedure Laterality Date  . ABDOMINAL HYSTERECTOMY    . APPENDECTOMY    . bowel obstruction  2017  . CYSTOSCOPY WITH RETROGRADE PYELOGRAM, URETEROSCOPY AND STENT PLACEMENT Right 06/18/2013   Procedure: CYSTOSCOPY WITH RETROGRADE PYELOGRAM, URETEROSCOPY AND STENT PLACEMENT;  Surgeon: Franchot Gallo, MD;  Location: [facility];  Service: Urology;  Laterality: Right;  . CYSTOSCOPY WITH RETROGRADE PYELOGRAM, URETEROSCOPY AND STENT PLACEMENT Right 07/05/2013   Procedure: CYSTOSCOPY , URETEROSCOPY ANDstone extraction;  Surgeon: Franchot Gallo, MD;  Location: [facility];  Service: Urology;   Laterality: Right;  . EXPLORATORY LAPARTOMY/  MYOMECTOMY  2005  . HYSTERECTOMY ABDOMINAL WITH SALPINGECTOMY Bilateral 09/02/2017   Procedure: HYSTERECTOMY ABDOMINAL WITH SALPINGECTOMY;  Surgeon: Lavonia Drafts, MD;  Location: [facility];  Service: Gynecology;  Laterality: Bilateral;  . LAPAROSCOPIC APPENDECTOMY N/A 07/28/2014   Procedure: APPENDECTOMY LAPAROSCOPIC;  Surgeon: Jackolyn Confer, MD;  Location: [facility];  Service: General;  Laterality: N/A;  . LAPAROSCOPIC GASTRIC BANDING  06-06-2008  . LAPAROSCOPY /  OVARIAN CYSTECTOMY/  LASER ABLATION ENDOMETRIOSIS  2003  . LAPAROTOMY N/A 11/24/2015   Procedure: EXPLORATORY LAPAROTOMY;  Surgeon: Georganna Skeans, MD;  Location: [facility];  Service: General;  Laterality: N/A;  . LYSIS OF ADHESION N/A 11/24/2015   Procedure: LYSIS OF ADHESION;  Surgeon: Georganna Skeans, MD;  Location: [facility];  Service: General;  Laterality: N/A;  . LYSIS OF ADHESION N/A 09/02/2017   Procedure: LYSIS OF ADHESION;  Surgeon: Lavonia Drafts, MD;  Location: [facility];  Service: Gynecology;  Laterality: N/A;  . wisdom teeth ext       OB History    Gravida  1   Para      Term      Preterm      AB  1   Living        SAB  1   TAB      Ectopic      Multiple      Live Births              Family History  Problem Relation Age of Onset  . Cancer Maternal Grandmother        colon/liver  . Stroke Father     Social History   Tobacco Use  . Smoking status: Never Smoker  . Smokeless tobacco: Never Used  Substance Use Topics  . Alcohol use: Yes    Alcohol/week: 0.0 standard drinks    Comment: occasionally  . Drug use: No    Home Medications Prior to Admission medications   Medication Sig Start Date End Date Taking? Authorizing Provider  cetirizine (ZYRTEC) 10 MG tablet Take 10 mg by mouth daily as needed for allergies or rhinitis.     [provider]  fluticasone furoate-vilanterol (BREO ELLIPTA) 100-25 MCG/INH AEPB Inhale 1 puff  into the lungs daily. Patient not taking: Reported on 12/03/2018 08/18/18   Katherine Roan, MD  gabapentin (NEURONTIN) 300 MG capsule Take 1 capsule (300 mg total) by mouth at bedtime. 07/29/19   Jeanmarie Hubert, MD  mirabegron ER (MYRBETRIQ) 25 MG TB24 tablet Take 25 mg by mouth daily.    [provider]  nitrofurantoin, macrocrystal-monohydrate, (MACROBID) 100 MG capsule Take 1 capsule (100 mg total) by mouth 2 (two) times daily for 5 days. 08/12/19 08/17/19  Ladona Horns, MD  ondansetron (ZOFRAN) 4 MG tablet Take 1 tablet (4 mg total) by mouth daily as needed  for nausea or vomiting. Patient not taking: Reported on 12/03/2018 11/16/18 11/16/19  Molli Hazard A, DO  pseudoephedrine (SUDAFED) 120 MG 12 hr tablet Take 120 mg by mouth every 12 (twelve) hours as needed for congestion.    [provider]  spironolactone (ALDACTONE) 25 MG tablet Take 0.5 tablets (12.5 mg total) by mouth daily. 03/11/19 06/09/19  Neva Seat, MD  traMADol (ULTRAM) 50 MG tablet Take 1 tablet (50 mg total) by mouth every 6 (six) hours as needed for severe pain. 03/10/19   Lavonia Drafts, MD    Allergies    Other and Vicodin [hydrocodone-acetaminophen]  Review of Systems   Review of Systems  Constitutional: Positive for fatigue. Negative for fever.  HENT: Negative for sore throat.   Eyes: Negative for visual disturbance.  Respiratory: Negative for cough and shortness of breath.   Cardiovascular: Negative for chest pain.  Gastrointestinal: Positive for abdominal pain and nausea. Negative for diarrhea, dysphagia, melena and vomiting.  Genitourinary: Positive for hematuria. Negative for dysuria.  Musculoskeletal: Negative for neck pain.  Skin: Negative for rash.  Neurological: Positive for weakness.    Physical Exam Updated Vital Signs BP (!) 148/106 (BP Location: Right Arm)   Pulse (!) 103   Temp 98.6 F (37 C) (Oral)   Resp 17   Ht 5\' 2"  (1.575 m)   Wt 70.8 kg   LMP  (LMP  Unknown) Comment: 07/2016  SpO2 100%   BMI 28.53 kg/m   Physical Exam Vitals and nursing note reviewed.  Constitutional:      General: She is not in acute distress.    Appearance: She is well-developed.  HENT:     Head: Normocephalic and atraumatic.  Eyes:     Conjunctiva/sclera: Conjunctivae normal.  Cardiovascular:     Rate and Rhythm: Normal rate and regular rhythm.     Pulses: Normal pulses.     Heart sounds: No murmur.  Pulmonary:     Effort: Pulmonary effort is normal. No respiratory distress.     Breath sounds: Normal breath sounds.  Abdominal:     Palpations: Abdomen is soft.     Tenderness: There is abdominal tenderness (diffuse ).  Musculoskeletal:        General: No deformity or signs of injury. Normal range of motion.     Cervical back: Neck supple.  Skin:    General: Skin is warm and dry.     Capillary Refill: Capillary refill takes less than 2 seconds.  Neurological:     General: No focal deficit present.     Mental Status: She is alert and oriented to person, place, and time.     Gait: Gait normal.     ED Results / Procedures / Treatments   Labs (all labs ordered are listed, but only abnormal results are displayed) Labs Reviewed  URINALYSIS, ROUTINE W REFLEX MICROSCOPIC - Abnormal; Notable for the following components:      Result Value   Color, Urine AMBER (*)    APPearance CLOUDY (*)    Specific Gravity, Urine 1.039 (*)    Hgb urine dipstick LARGE (*)    Ketones, ur 5 (*)    Protein, ur 100 (*)    RBC / HPF >50 (*)    All other components within normal limits  COMPREHENSIVE METABOLIC PANEL - Abnormal; Notable for the following components:   Potassium 2.8 (*)    All other components within normal limits  URINE CULTURE  CBC  LIPASE, BLOOD  CBG MONITORING,  ED    EKG EKG Interpretation  Date/Time:  Monday August 16 2019 16:25:17 EDT Ventricular Rate:  80 PR Interval:    QRS Duration: 76 QT Interval:  389 QTC Calculation: 449 R  Axis:   72 Text Interpretation: Sinus rhythm Consider right atrial enlargement Abnormal R-wave progression, early transition 12 Lead; Mason-Likar No significant change since 10/20 Confirmed by Aletta Edouard [phone]) on 08/16/2019 4:58:22 PM   Radiology CT Abdomen Pelvis W Contrast  Result Date: 08/16/2019 CLINICAL DATA:  Loss of appetite. EXAM: CT ABDOMEN AND PELVIS WITH CONTRAST TECHNIQUE: Multidetector CT imaging of the abdomen and pelvis was performed using the standard protocol following bolus administration of intravenous contrast. CONTRAST:  18mL OMNIPAQUE IOHEXOL 300 MG/ML  SOLN COMPARISON:  November 11, 2018 FINDINGS: Lower chest: The lung bases are clear. The heart size is normal. Hepatobiliary: The liver is normal. Normal gallbladder.There is no biliary ductal dilation. Pancreas: Normal contours without ductal dilatation. No peripancreatic fluid collection. Spleen: Unremarkable. Adrenals/Urinary Tract: --Adrenal glands: Unremarkable. --Right kidney/ureter: No hydronephrosis or radiopaque kidney stones. --Left kidney/ureter: No hydronephrosis or radiopaque kidney stones. --Urinary bladder: The bladder is decompressed and therefore poorly evaluated. Stomach/Bowel: --Stomach/Duodenum: There is a lap band in place. The distal esophagus is dilated and patulous. The lap band positioning is essentially stable from prior study. --Small bowel: Unremarkable. --Colon: Unremarkable. --Appendix: Surgically absent. Vascular/Lymphatic: Normal course and caliber of the major abdominal vessels. --No retroperitoneal lymphadenopathy. --No mesenteric lymphadenopathy. --No pelvic or inguinal lymphadenopathy. Reproductive: Unremarkable Other: No ascites or free air. The abdominal wall is normal. Musculoskeletal. No acute displaced fractures. IMPRESSION: 1. Stable positioning of the previously demonstrated gastric band. The distal esophagus is dilated patulous. 2. No acute intra-abdominal abnormality detected. No evidence for  small bowel obstruction. The patient is status post prior appendectomy. Electronically Signed   By: Constance Holster M.D.   On: 08/16/2019 19:13    Procedures Procedures (including critical care time)  Medications Ordered in ED Medications  sodium chloride 0.9 % bolus 1,000 mL (0 mLs Intravenous Stopped 08/16/19 1934)  potassium chloride SA (KLOR-CON) CR tablet 40 mEq (40 mEq Oral Given 08/16/19 1801)  ondansetron (ZOFRAN) injection 4 mg (4 mg Intravenous Given 08/16/19 1801)  iohexol (OMNIPAQUE) 300 MG/ML solution 100 mL (100 mLs Intravenous Contrast Given 08/16/19 1842)    ED Course  I have reviewed the triage vital signs and the nursing notes.  Pertinent labs & imaging results that were available during my care of the patient were reviewed by me and considered in my medical decision making (see chart for details).  Clinical Course as of Aug 16 1021  Mon Aug 16, 2019  1905 Lab work showing stable hemoglobin normal white blood cell count.  Chemistries and LFTs normal except for potassium of 2.8 orally repleted.  Urinalysis showing greater than 50 reds 11-20 whites no bacteria.  Known history of hematuria.   [MB]    Clinical Course User Index [MB] Hayden Rasmussen, MD   MDM Rules/Calculators/A&P                     This patient complains of abdominal pain fatigue hematuria among others; this involves an extensive number of treatment Options and is a complaint that carries with it a high risk of complications and Morbidity. The differential includes perforation obstruction anemia metabolic derangement infection  I ordered, reviewed and interpreted labs, which included normal hemoglobin normal white count normal chemistries other than low potassium of 2.8 orally repleted.  Urinalysis  showing greater than 50 reds 11-20 whites with reported history of hematuria of unclear etiology I ordered medication oral potassium IV fluids and nausea medication with improvement in her symptoms. I ordered  imaging studies which included CT abdomen and pelvis and I independently    visualized and interpreted imaging which showed no acute findings to account for patient's symptoms. Previous records obtained and reviewed prior medical records in epic including her last clinic visit a few days ago  After the interventions stated above, I reevaluated the patient and found improvement in her nausea.  Tolerating some fluids.  I reviewed her lab work and her imaging with her.  She remains frustrated that we have not come up on a reason for her ongoing symptoms.  Recommended that she follow-up with her primary care doctor and her specialist to continue her evaluation.   Final Clinical Impression(s) / ED Diagnoses Final diagnoses:  Fatigue, unspecified type  Generalized abdominal pain  Hematuria, unspecified type    Rx / DC Orders ED Discharge Orders         Ordered    potassium chloride (KLOR-CON) 10 MEQ tablet  Daily     08/16/19 1939    ondansetron (ZOFRAN) 4 MG tablet  Every 8 hours PRN     08/16/19 1939           Hayden Rasmussen, MD 08/17/19 1027

## 2019-08-16 NOTE — Progress Notes (Signed)
Internal Medicine Clinic Attending  Case discussed with Dr. Jones at the time of the visit.  We reviewed the resident's history and exam and pertinent patient test results.  I agree with the assessment, diagnosis, and plan of care documented in the resident's note.  

## 2019-08-17 ENCOUNTER — Encounter: Payer: Self-pay | Admitting: Internal Medicine

## 2019-08-17 ENCOUNTER — Ambulatory Visit: Payer: Self-pay | Attending: Internal Medicine | Admitting: Physical Therapy

## 2019-08-17 DIAGNOSIS — M79641 Pain in right hand: Secondary | ICD-10-CM | POA: Insufficient documentation

## 2019-08-17 DIAGNOSIS — M546 Pain in thoracic spine: Secondary | ICD-10-CM | POA: Insufficient documentation

## 2019-08-17 MED ORDER — FLUCONAZOLE 150 MG PO TABS: 150.0000 mg | ORAL_TABLET | Freq: Once | ORAL | 0 refills | Status: AC

## 2019-08-17 NOTE — Progress Notes (Signed)
Attempted to call pt to notify of results and positive Candida on wet prep. No answer and voicemail full. Printed results to be mailed to pt. Will send treatment for Candida vaginitis with fluconazole 150mg  PO once to [facility] on [address] and call pt again this afternoon.

## 2019-08-17 NOTE — Progress Notes (Signed)
Pt with positive Candida vaginitis on wet prep. Attempted to call results to pt x 2 today with no answer and voicemail box full. Prescription for fluconazole 150mg  once sent into her [place]. Results printed off to be mailed to pt.  K repleted during ED visit on 4/5, per chart review.

## 2019-08-18 ENCOUNTER — Telehealth: Payer: Self-pay | Admitting: *Deleted

## 2019-08-18 LAB — URINE CULTURE: Culture: NO GROWTH

## 2019-08-18 NOTE — Telephone Encounter (Signed)
Pt called stating someone had called, she was informed that dr Ronnald Ramp had tried to call and had put her lab results in the mail, she was agreeable.

## 2019-08-18 NOTE — Telephone Encounter (Signed)
Talked with pt over the phone regarding lab results with Candida on wet prep and fluconazole dose sent to [place]. Pt understanding and agreeable to picking up this prescription when back in town.

## 2019-08-26 ENCOUNTER — Encounter: Payer: Self-pay | Admitting: Physical Therapy

## 2019-08-26 ENCOUNTER — Other Ambulatory Visit: Payer: Self-pay

## 2019-08-26 ENCOUNTER — Ambulatory Visit: Payer: Self-pay | Admitting: Physical Therapy

## 2019-08-26 DIAGNOSIS — M79641 Pain in right hand: Secondary | ICD-10-CM | POA: Diagnosis present

## 2019-08-26 DIAGNOSIS — M546 Pain in thoracic spine: Secondary | ICD-10-CM | POA: Diagnosis not present

## 2019-08-26 NOTE — Therapy (Signed)
[place], [place], [address] Phone: [phone]   Fax:  [phone]  Physical Therapy Treatment  Patient Details  Name: Meagan Dean MRN: [id number] Date of Birth: 27-Sep-1970 Referring Provider (PT): Sid Falcon, MD    Encounter Date: 08/26/2019  PT End of Session - 08/26/19 1554    Visit Number  2    Number of Visits  17    Date for PT Re-Evaluation  09/08/19    PT Start Time  [id number]    PT Stop Time  1630    PT Time Calculation (min)  38 min    Activity Tolerance  Patient tolerated treatment well    Behavior During Therapy  [facility] for tasks assessed/performed       Past Medical History:  Diagnosis Date  . Anemia   . ASCUS with positive high risk HPV cervical 03/15/2017  . Asthma due to seasonal allergies    MILD-- JUST GOT INHALER-not used yet -allergy induced  . Calculus of ureter 06/16/2013  . Endometriosis of pelvis   . Fibroids 09/02/2017  . GERD (gastroesophageal reflux disease)    diet controlled  . H/O hiatal hernia   . Headache    MIGRAINES - none since weight loss surgery  . History of endometriosis 03/28/2017  . History of hypertension    NO ISSUES SINCE WT LOSS AFTER GASTRIC BANDING  . History of kidney stones    surgery to remove  . History of obstructive sleep apnea    DX'D PRIOR TO GASTRIC BANDING  2010 --  NO ISSUES SINCE WT LOSS  . Hypertension 03/28/2017  . Low potassium syndrome    hopsitalized for 3 days  . Right ureteral stone   . Seasonal allergies   . Sinus headache   . Sleep apnea    prior to weight loss surgery, does not have cpap  . Urgency of urination     Past Surgical History:  Procedure Laterality Date  . ABDOMINAL HYSTERECTOMY    . APPENDECTOMY    . bowel obstruction  2017  . CYSTOSCOPY WITH RETROGRADE PYELOGRAM, URETEROSCOPY AND STENT PLACEMENT Right 06/18/2013   Procedure: CYSTOSCOPY WITH RETROGRADE PYELOGRAM, URETEROSCOPY AND STENT PLACEMENT;   Surgeon: Franchot Gallo, MD;  Location: [facility];  Service: Urology;  Laterality: Right;  . CYSTOSCOPY WITH RETROGRADE PYELOGRAM, URETEROSCOPY AND STENT PLACEMENT Right 07/05/2013   Procedure: CYSTOSCOPY , URETEROSCOPY ANDstone extraction;  Surgeon: Franchot Gallo, MD;  Location: [facility];  Service: Urology;  Laterality: Right;  . EXPLORATORY LAPARTOMY/  MYOMECTOMY  2005  . HYSTERECTOMY ABDOMINAL WITH SALPINGECTOMY Bilateral 09/02/2017   Procedure: HYSTERECTOMY ABDOMINAL WITH SALPINGECTOMY;  Surgeon: Lavonia Drafts, MD;  Location: [facility];  Service: Gynecology;  Laterality: Bilateral;  . LAPAROSCOPIC APPENDECTOMY N/A 07/28/2014   Procedure: APPENDECTOMY LAPAROSCOPIC;  Surgeon: Jackolyn Confer, MD;  Location: [facility];  Service: General;  Laterality: N/A;  . LAPAROSCOPIC GASTRIC BANDING  06-06-2008  . LAPAROSCOPY /  OVARIAN CYSTECTOMY/  LASER ABLATION ENDOMETRIOSIS  2003  . LAPAROTOMY N/A 11/24/2015   Procedure: EXPLORATORY LAPAROTOMY;  Surgeon: Georganna Skeans, MD;  Location: [facility];  Service: General;  Laterality: N/A;  . LYSIS OF ADHESION N/A 11/24/2015   Procedure: LYSIS OF ADHESION;  Surgeon: Georganna Skeans, MD;  Location: [facility];  Service: General;  Laterality: N/A;  . LYSIS OF ADHESION N/A 09/02/2017   Procedure: LYSIS OF ADHESION;  Surgeon: Lavonia Drafts, MD;  Location: [facility];  Service: Gynecology;  Laterality: N/A;  .  wisdom teeth ext      There were no vitals filed for this visit.  Subjective Assessment - 08/26/19 1554    Subjective  Patient reports that she was in the ER due to dehydration. She is still having some stomach issues. Her back is getting better, and she doesn't have pain constantly.    Pertinent History  Appendectomy, Hysterectomy    Limitations  Lifting;Standing;Walking;Sitting;Writing;House hold activities    How long can you sit comfortably?  10 mins    How long can you stand comfortably?  5 mins    How long can you walk comfortably?   5 mins    Patient Stated Goals  I want my back to feel better, decrease pain in back to get back to functional activities.    Currently in Pain?  Yes    Pain Score  5     Pain Location  Generalized    Pain Orientation  Mid;Lower    Pain Descriptors / Indicators  Aching    Pain Type  Chronic pain    Pain Onset  More than a month ago    Pain Frequency  Constant    Aggravating Factors   Driving, Typing    Pain Relieving Factors  Sleep    Effect of Pain on Daily Activities  Tiring out a lot, driving, walking         [facility] PT Assessment - 08/26/19 0001      Assessment   Medical Diagnosis                      [facility] Adult PT Treatment/Exercise - 08/26/19 0001      Exercises   Exercises  Shoulder;Lumbar    Other Exercises   Cat/Cow, Childs Pose      Lumbar Exercises: Supine   Large Ball Abdominal Isometric  20 reps;2 seconds    Other Supine Lumbar Exercises  Serratus punches, back stroke, hugs w/ core engaged       Shoulder Exercises: Seated   Retraction  Strengthening;Both;20 reps    Theraband Level (Shoulder Retraction)  Level 1 (Yellow)    Protraction  Both;20 reps    Theraband Level (Shoulder Protraction)  Level 1 (Yellow)      Manual Therapy   Manual Therapy  Joint mobilization    Joint Mobilization  Thoracic Spine Central PA  Grade I             PT Education - 08/26/19 1830    Education Details  Patient educated on importance of performing HEP, joint mobs, and rehab expectations    Person(s) Educated  Patient    Methods  Explanation;Demonstration    Comprehension  Verbalized understanding;Returned demonstration       PT Short Term Goals - 08/11/19 1759      PT SHORT TERM GOAL #1   Title  Patient will be able to initiate HEP    Time  4    Period  Weeks    Status  New    Target Date  09/08/19      PT SHORT TERM GOAL #2   Title  Patient will be able to increase thoracic flexion >/= 5 degrees in order to decrease pain when lifting and  transferring objects    Time  4    Period  Weeks    Status  New    Target Date  09/08/19      PT SHORT TERM GOAL #3   Title  Patient  will be able to demonstrate appropriate sitting posture    Baseline  Rounded shoulders and forward head posture    Time  4    Period  Weeks    Status  New    Target Date  09/08/19      PT SHORT TERM GOAL #4   Title  Patient will report that she is able to sit comfotably for >/= 10 mins in order to drive for work    Baseline  <5 mins    Time  4    Period  Weeks    Status  New    Target Date  09/08/19        PT Long Term Goals - 08/11/19 1806      PT LONG TERM GOAL #1   Title  Patient will report worst pain as </= 3/10 to improve quality of life    Time  8    Period  Weeks    Status  New    Target Date  10/06/19      PT LONG TERM GOAL #2   Title  Patient will achieve >/= 10 degrees of flexion and extension in order to decrease pain with functional activities    Baseline  Flexion: 5 Extension: 10    Time  8    Period  Weeks    Status  New    Target Date  10/06/19      PT LONG TERM GOAL #3   Title  Patient will report that she is able to type on her computer for school and work with no greater than a 4/10 pain    Baseline  Patient reports that pain often interferes with her ability to complete work    Time  8    Period  Weeks    Status  New    Target Date  10/06/19      PT LONG TERM GOAL #4   Title  Patient will be able to sit for >/= 1 hour in order to drive long distances for work.    Baseline  Patient reports that she is able to sit for < 5 mins    Time  8    Period  Weeks    Status  New    Target Date  10/06/19            Plan - 08/26/19 1827    Clinical Impression Statement  Today the patient reports that her pain has decreased. She is still TTP in the mid-thoracic region. Joint mobs were performed in addition to some light therapeutic exercises. She responded well to the session. Patient would benefit from PT to  further address pain and postural deficits.    Personal Factors and Comorbidities  Comorbidity 3+    Comorbidities  HTN, Depression, Abdominal pain    Examination-Activity Limitations  Bathing;Bend;Dressing;Hygiene/Grooming;Lift;Reach Overhead;Sit;Stand;Transfers    Examination-Participation Restrictions  Cleaning;Community Activity;Driving;Laundry    Stability/Clinical Decision Making  Unstable/Unpredictable    Clinical Decision Making  High    Rehab Potential  Good    PT Frequency  2x / week    PT Duration  8 weeks    PT Treatment/Interventions  ADLs/Self Care Home Management;Cryotherapy;Electrical Stimulation;Iontophoresis 4mg /ml Dexamethasone;Moist Heat;Traction;Ultrasound;Stair training;Gait training;Functional mobility training;Therapeutic activities;Therapeutic exercise;Manual techniques;Passive range of motion;Dry needling;Spinal Manipulations;Joint Manipulations;Patient/family education    PT Next Visit Plan  Assess how patient responded to session, follow-up on HEP    PT Home Exercise Plan  Upper Traps and levator stretch, Cat/Cow, Neta Mends  Consulted and Agree with Plan of Care  Patient       Patient will benefit from skilled therapeutic intervention in order to improve the following deficits and impairments:  Decreased activity tolerance, Decreased endurance, Decreased mobility, Decreased range of motion, Improper body mechanics, Postural dysfunction, Pain, Increased fascial restricitons, Difficulty walking  Visit Diagnosis: Pain in thoracic spine     Problem List Patient Active Problem List   Diagnosis Date Noted  . Fatigue 08/12/2019  . Back pain 07/30/2019  . Palpitation 03/11/2019  . Asthma due to seasonal allergies 08/18/2018  . Post-operative state 09/02/2017  . History of endometriosis 03/28/2017  . Hypertension 03/28/2017  . Abdominal pain in female 03/21/2017  . Vaginal discharge 03/07/2017  . Hematuria 02/01/2017  . Urgency of micturition 01/31/2017   . History of obstructive sleep apnea   . Diarrhea 12/15/2015  . History of laparoscopic adjustable gastric banding   . Partial small bowel obstruction ([place]) 11/17/2015  . Healthcare maintenance 06/27/2015  . Routine health maintenance 04/03/2015  . Perforated appendicitis s/p lap appy 07/28/2014 07/28/2014  . Hypokalemia 06/17/2013    Meagan Dean, SPT 08/26/2019, 6:35 PM  [facility] [address] [place], [place], [address] Phone: [phone]   Fax:  [phone]  Name: Meagan Dean MRN: [id number] Date of Birth: 04/02/1971

## 2019-08-30 ENCOUNTER — Encounter: Payer: Self-pay | Admitting: Physical Therapy

## 2019-08-30 ENCOUNTER — Ambulatory Visit: Payer: Self-pay | Admitting: Physical Therapy

## 2019-08-30 ENCOUNTER — Other Ambulatory Visit: Payer: Self-pay

## 2019-08-30 DIAGNOSIS — M546 Pain in thoracic spine: Secondary | ICD-10-CM

## 2019-08-30 DIAGNOSIS — M79641 Pain in right hand: Secondary | ICD-10-CM

## 2019-08-30 NOTE — Therapy (Signed)
[place], [place], [address] Phone: [phone]   Fax:  [phone]  Physical Therapy Treatment  Patient Details  Name: Meagan Dean MRN: [id number] Date of Birth: 1970-07-08 Referring Provider (PT): Sid Falcon, MD    Encounter Date: 08/30/2019  PT End of Session - 08/30/19 1503    Visit Number  3    Number of Visits  17    Date for PT Re-Evaluation  09/08/19    PT Start Time  1503    PT Stop Time  [id number]    PT Time Calculation (min)  41 min    Activity Tolerance  Patient tolerated treatment well    Behavior During Therapy  [facility] for tasks assessed/performed       Past Medical History:  Diagnosis Date  . Anemia   . ASCUS with positive high risk HPV cervical 03/15/2017  . Asthma due to seasonal allergies    MILD-- JUST GOT INHALER-not used yet -allergy induced  . Calculus of ureter 06/16/2013  . Endometriosis of pelvis   . Fibroids 09/02/2017  . GERD (gastroesophageal reflux disease)    diet controlled  . H/O hiatal hernia   . Headache    MIGRAINES - none since weight loss surgery  . History of endometriosis 03/28/2017  . History of hypertension    NO ISSUES SINCE WT LOSS AFTER GASTRIC BANDING  . History of kidney stones    surgery to remove  . History of obstructive sleep apnea    DX'D PRIOR TO GASTRIC BANDING  2010 --  NO ISSUES SINCE WT LOSS  . Hypertension 03/28/2017  . Low potassium syndrome    hopsitalized for 3 days  . Right ureteral stone   . Seasonal allergies   . Sinus headache   . Sleep apnea    prior to weight loss surgery, does not have cpap  . Urgency of urination     Past Surgical History:  Procedure Laterality Date  . ABDOMINAL HYSTERECTOMY    . APPENDECTOMY    . bowel obstruction  2017  . CYSTOSCOPY WITH RETROGRADE PYELOGRAM, URETEROSCOPY AND STENT PLACEMENT Right 06/18/2013   Procedure: CYSTOSCOPY WITH RETROGRADE PYELOGRAM, URETEROSCOPY AND STENT PLACEMENT;   Surgeon: Franchot Gallo, MD;  Location: [facility];  Service: Urology;  Laterality: Right;  . CYSTOSCOPY WITH RETROGRADE PYELOGRAM, URETEROSCOPY AND STENT PLACEMENT Right 07/05/2013   Procedure: CYSTOSCOPY , URETEROSCOPY ANDstone extraction;  Surgeon: Franchot Gallo, MD;  Location[facility];  Service: Urology;  Laterality: Right;  . EXPLORATORY LAPARTOMY/  MYOMECTOMY  2005  . HYSTERECTOMY ABDOMINAL WITH SALPINGECTOMY Bilateral 09/02/2017   Procedure: HYSTERECTOMY ABDOMINAL WITH SALPINGECTOMY;  Surgeon: Lavonia Drafts, MD;  Location: [facility];  Service: Gynecology;  Laterality: Bilateral;  . LAPAROSCOPIC APPENDECTOMY N/A 07/28/2014   Procedure: APPENDECTOMY LAPAROSCOPIC;  Surgeon: Jackolyn Confer, MD;  Location: [facility];  Service: General;  Laterality: N/A;  . LAPAROSCOPIC GASTRIC BANDING  06-06-2008  . LAPAROSCOPY /  OVARIAN CYSTECTOMY/  LASER ABLATION ENDOMETRIOSIS  2003  . LAPAROTOMY N/A 11/24/2015   Procedure: EXPLORATORY LAPAROTOMY;  Surgeon: Georganna Skeans, MD;  Location: [facility];  Service: General;  Laterality: N/A;  . LYSIS OF ADHESION N/A 11/24/2015   Procedure: LYSIS OF ADHESION;  Surgeon: Georganna Skeans, MD;  Location: [facility];  Service: General;  Laterality: N/A;  . LYSIS OF ADHESION N/A 09/02/2017   Procedure: LYSIS OF ADHESION;  Surgeon: Lavonia Drafts, MD;  Location: [facility];  Service: Gynecology;  Laterality: N/A;  .  wisdom teeth ext      There were no vitals filed for this visit.  Subjective Assessment - 08/30/19 1505    Subjective  "I am doing pretty good, I still get some soreness in the back but its at a 5/10 which I think is pretty manage able"    Patient Stated Goals  I want my back to feel better, decrease pain in back to get back to functional activities.    Currently in Pain?  Yes    Pain Score  5     Pain Location  Thoracic    Pain Orientation  Mid    Pain Descriptors / Indicators  Aching;Sore    Pain Onset  More than a month ago    Pain  Frequency  Intermittent    Aggravating Factors   driving, prolonged sitting    Pain Relieving Factors  sleeping.         [facility] PT Assessment - 08/30/19 0001      Assessment   Medical Diagnosis  Acute bilateral thoracic back pain     Referring Provider (PT)  Sid Falcon, MD                    [facility] Adult PT Treatment/Exercise - 08/30/19 1509      Self-Care   Self-Care  Other Self-Care Comments    Other Self-Care Comments   self manual trigger point release along the paraspinals      Neck Exercises: Machines for Strengthening   Nustep  L4 x 5 min UE/LE      Neck Exercises: Theraband   Rows  10 reps;Red   tactile cues for proper form     Neck Exercises: Sidelying   Other Sidelying Exercise  thoracic book opening 1 x 10 performed bile      Lumbar Exercises: Seated   Other Seated Lumbar Exercises  anterior pelvic tilt 2 x 10 holding 5 sec ea.   demonstration for proper form     Manual Therapy   Manual therapy comments  MTPR along the mid thoracic paraspinals bil     Joint Mobilization  Thoracic Spine  T6-T12 Central PA  Grade II      Neck Exercises: Stretches   Other Neck Stretches  rhomboid stretch 2 x 30 sec             PT Education - 08/30/19 1529    Education Details  reviewed HEP and updated today for book opening, rows and anterior pelvic. how to perofrm self MTPR using tennis ball    Person(s) Educated  Patient    Methods  Explanation;Verbal cues;Handout    Comprehension  Verbalized understanding;Verbal cues required       PT Short Term Goals - 08/11/19 1759      PT SHORT TERM GOAL #1   Title  Patient will be able to initiate HEP    Time  4    Period  Weeks    Status  New    Target Date  09/08/19      PT SHORT TERM GOAL #2   Title  Patient will be able to increase thoracic flexion >/= 5 degrees in order to decrease pain when lifting and transferring objects    Time  4    Period  Weeks    Status  New    Target Date  09/08/19       PT SHORT TERM GOAL #3   Title  Patient will be able  to demonstrate appropriate sitting posture    Baseline  Rounded shoulders and forward head posture    Time  4    Period  Weeks    Status  New    Target Date  09/08/19      PT SHORT TERM GOAL #4   Title  Patient will report that she is able to sit comfotably for >/= 10 mins in order to drive for work    Baseline  <5 mins    Time  4    Period  Weeks    Status  New    Target Date  09/08/19        PT Long Term Goals - 08/11/19 1806      PT LONG TERM GOAL #1   Title  Patient will report worst pain as </= 3/10 to improve quality of life    Time  8    Period  Weeks    Status  New    Target Date  10/06/19      PT LONG TERM GOAL #2   Title  Patient will achieve >/= 10 degrees of flexion and extension in order to decrease pain with functional activities    Baseline  Flexion: 5 Extension: 10    Time  8    Period  Weeks    Status  New    Target Date  10/06/19      PT LONG TERM GOAL #3   Title  Patient will report that she is able to type on her computer for school and work with no greater than a 4/10 pain    Baseline  Patient reports that pain often interferes with her ability to complete work    Time  8    Period  Weeks    Status  New    Target Date  10/06/19      PT LONG TERM GOAL #4   Title  Patient will be able to sit for >/= 1 hour in order to drive long distances for work.    Baseline  Patient reports that she is able to sit for < 5 mins    Time  8    Period  Weeks    Status  New    Target Date  10/06/19            Plan - 08/30/19 1734    Clinical Impression Statement  pt continues to report pain at a 5/10 but notes that it is manageable. continued working on thoracic ROM and STW to calm down muscle tension. She responded well to trigger point release and was instructed how to perform it at home. end of session she reported pain dropped to 2-3/10.    PT Treatment/Interventions  ADLs/Self Care Home  Management;Cryotherapy;Electrical Stimulation;Iontophoresis 4mg /ml Dexamethasone;Moist Heat;Traction;Ultrasound;Stair training;Gait training;Functional mobility training;Therapeutic activities;Therapeutic exercise;Manual techniques;Passive range of motion;Dry needling;Spinal Manipulations;Joint Manipulations;Patient/family education    PT Next Visit Plan  Assess how patient responded to session, thoracic mobility/ mobs, STW PRN, core strengthening, posterior shoulder strengthenig, review posture    PT Home Exercise Plan  Upper Traps and levator stretch, Cat/Cow, Neta Mends, trigger point release.    Consulted and Agree with Plan of Care  Patient       Patient will benefit from skilled therapeutic intervention in order to improve the following deficits and impairments:  Decreased activity tolerance, Decreased endurance, Decreased mobility, Decreased range of motion, Improper body mechanics, Postural dysfunction, Pain, Increased fascial restricitons, Difficulty walking  Visit Diagnosis: Pain  in thoracic spine  Pain in right hand     Problem List Patient Active Problem List   Diagnosis Date Noted  . Fatigue 08/12/2019  . Back pain 07/30/2019  . Palpitation 03/11/2019  . Asthma due to seasonal allergies 08/18/2018  . Post-operative state 09/02/2017  . History of endometriosis 03/28/2017  . Hypertension 03/28/2017  . Abdominal pain in female 03/21/2017  . Vaginal discharge 03/07/2017  . Hematuria 02/01/2017  . Urgency of micturition 01/31/2017  . History of obstructive sleep apnea   . Diarrhea 12/15/2015  . History of laparoscopic adjustable gastric banding   . Partial small bowel obstruction ([place]) 11/17/2015  . Healthcare maintenance 06/27/2015  . Routine health maintenance 04/03/2015  . Perforated appendicitis s/p lap appy 07/28/2014 07/28/2014  . Hypokalemia 06/17/2013   Starr Lake PT, DPT, LAT, ATC  08/30/19  5:38 PM      [place]  [facility] [address] [place], [place], [address] Phone: [phone]   Fax:  [phone]  Name: ZAINAB GOECKNER MRN: [id number] Date of Birth: 1970/10/10

## 2019-09-01 ENCOUNTER — Encounter: Payer: Self-pay | Admitting: *Deleted

## 2019-09-01 ENCOUNTER — Ambulatory Visit: Payer: Self-pay | Admitting: Physical Therapy

## 2019-09-02 ENCOUNTER — Encounter: Payer: Self-pay | Admitting: Physical Therapy

## 2019-09-02 ENCOUNTER — Ambulatory Visit: Payer: Self-pay | Admitting: Physical Therapy

## 2019-09-02 ENCOUNTER — Other Ambulatory Visit: Payer: Self-pay

## 2019-09-02 DIAGNOSIS — M79641 Pain in right hand: Secondary | ICD-10-CM

## 2019-09-02 DIAGNOSIS — M546 Pain in thoracic spine: Secondary | ICD-10-CM

## 2019-09-02 NOTE — Therapy (Signed)
[place], [place], [address] Phone: [phone]   Fax:  [phone]  Physical Therapy Treatment  Patient Details  Name: Meagan Dean MRN: [id number] Date of Birth: 08/26/1970 Referring Provider (PT): Sid Falcon, MD    Encounter Date: 09/02/2019  PT End of Session - 09/02/19 1549    Visit Number  4    Number of Visits  17    Date for PT Re-Evaluation  09/08/19    PT Start Time  [id number]    PT Stop Time  1630    PT Time Calculation (min)  41 min    Activity Tolerance  Patient tolerated treatment well    Behavior During Therapy  [facility] for tasks assessed/performed       Past Medical History:  Diagnosis Date  . Anemia   . ASCUS with positive high risk HPV cervical 03/15/2017  . Asthma due to seasonal allergies    MILD-- JUST GOT INHALER-not used yet -allergy induced  . Calculus of ureter 06/16/2013  . Endometriosis of pelvis   . Fibroids 09/02/2017  . GERD (gastroesophageal reflux disease)    diet controlled  . H/O hiatal hernia   . Headache    MIGRAINES - none since weight loss surgery  . History of endometriosis 03/28/2017  . History of hypertension    NO ISSUES SINCE WT LOSS AFTER GASTRIC BANDING  . History of kidney stones    surgery to remove  . History of obstructive sleep apnea    DX'D PRIOR TO GASTRIC BANDING  2010 --  NO ISSUES SINCE WT LOSS  . Hypertension 03/28/2017  . Low potassium syndrome    hopsitalized for 3 days  . Right ureteral stone   . Seasonal allergies   . Sinus headache   . Sleep apnea    prior to weight loss surgery, does not have cpap  . Urgency of urination     Past Surgical History:  Procedure Laterality Date  . ABDOMINAL HYSTERECTOMY    . APPENDECTOMY    . bowel obstruction  2017  . CYSTOSCOPY WITH RETROGRADE PYELOGRAM, URETEROSCOPY AND STENT PLACEMENT Right 06/18/2013   Procedure: CYSTOSCOPY WITH RETROGRADE PYELOGRAM, URETEROSCOPY AND STENT PLACEMENT;   Surgeon: Franchot Gallo, MD;  Location: [facility];  Service: Urology;  Laterality: Right;  . CYSTOSCOPY WITH RETROGRADE PYELOGRAM, URETEROSCOPY AND STENT PLACEMENT Right 07/05/2013   Procedure: CYSTOSCOPY , URETEROSCOPY ANDstone extraction;  Surgeon: Franchot Gallo, MD;  Location: [facility];  Service: Urology;  Laterality: Right;  . EXPLORATORY LAPARTOMY/  MYOMECTOMY  2005  . HYSTERECTOMY ABDOMINAL WITH SALPINGECTOMY Bilateral 09/02/2017   Procedure: HYSTERECTOMY ABDOMINAL WITH SALPINGECTOMY;  Surgeon: Lavonia Drafts, MD;  Location: [facility];  Service: Gynecology;  Laterality: Bilateral;  . LAPAROSCOPIC APPENDECTOMY N/A 07/28/2014   Procedure: APPENDECTOMY LAPAROSCOPIC;  Surgeon: Jackolyn Confer, MD;  Location: [facility];  Service: General;  Laterality: N/A;  . LAPAROSCOPIC GASTRIC BANDING  06-06-2008  . LAPAROSCOPY /  OVARIAN CYSTECTOMY/  LASER ABLATION ENDOMETRIOSIS  2003  . LAPAROTOMY N/A 11/24/2015   Procedure: EXPLORATORY LAPAROTOMY;  Surgeon: Georganna Skeans, MD;  Location: [place];  Service: General;  Laterality: N/A;  . LYSIS OF ADHESION N/A 11/24/2015   Procedure: LYSIS OF ADHESION;  Surgeon: Georganna Skeans, MD;  Location: [place];  Service: General;  Laterality: N/A;  . LYSIS OF ADHESION N/A 09/02/2017   Procedure: LYSIS OF ADHESION;  Surgeon: Lavonia Drafts, MD;  Location: [facility] [place];  Service: Gynecology;  Laterality: N/A;  .  wisdom teeth ext      There were no vitals filed for this visit.  Subjective Assessment - 09/02/19 1550    Subjective  "I did the tennis ball thing at home and I am feeling a little sore today. I am thinking it is still posture"    Patient Stated Goals  I want my back to feel better, decrease pain in back to get back to functional activities.    Currently in Pain?  Yes    Pain Score  7     Pain Location  Thoracic    Pain Orientation  Mid    Pain Descriptors / Indicators  Aching;Sore    Pain Type  Chronic pain    Pain Onset  More than a  month ago         [facility] PT Assessment - 09/02/19 0001      Assessment   Medical Diagnosis  Acute bilateral thoracic back pain     Referring Provider (PT)  Sid Falcon, MD                    [facility] Adult PT Treatment/Exercise - 09/02/19 0001      Modalities   Modalities  Electrical Stimulation;Moist Heat      Moist Heat Therapy   Number Minutes Moist Heat  15 Minutes    Moist Heat Location  Other (comment)   thoracic spine in prone     Electrical Stimulation   Electrical Stimulation Location  upper thoracic spine    Electrical Stimulation Action  IFC    Electrical Stimulation Parameters  L30 x 15 min 80-150, 100% scan    Electrical Stimulation Goals  Pain      Manual Therapy   Manual Therapy  Soft tissue mobilization    Joint Mobilization  Thoracic Spine  T6-T12 [place]  Grade I-II for pain    Soft tissue mobilization  IASTM along bil thoracic paraspinals               PT Short Term Goals - 08/11/19 1759      PT SHORT TERM GOAL #1   Title  Patient will be able to initiate HEP    Time  4    Period  Weeks    Status  New    Target Date  09/08/19      PT SHORT TERM GOAL #2   Title  Patient will be able to increase thoracic flexion >/= 5 degrees in order to decrease pain when lifting and transferring objects    Time  4    Period  Weeks    Status  New    Target Date  09/08/19      PT SHORT TERM GOAL #3   Title  Patient will be able to demonstrate appropriate sitting posture    Baseline  Rounded shoulders and forward head posture    Time  4    Period  Weeks    Status  New    Target Date  09/08/19      PT SHORT TERM GOAL #4   Title  Patient will report that she is able to sit comfotably for >/= 10 mins in order to drive for work    Baseline  <5 mins    Time  4    Period  Weeks    Status  New    Target Date  09/08/19        PT Long Term Goals - 08/11/19  1806      PT LONG TERM GOAL #1   Title  Patient will report worst pain as  </= 3/10 to improve quality of life    Time  8    Period  Weeks    Status  New    Target Date  10/06/19      PT LONG TERM GOAL #2   Title  Patient will achieve >/= 10 degrees of flexion and extension in order to decrease pain with functional activities    Baseline  Flexion: 5 Extension: 10    Time  8    Period  Weeks    Status  New    Target Date  10/06/19      PT LONG TERM GOAL #3   Title  Patient will report that she is able to type on her computer for school and work with no greater than a 4/10 pain    Baseline  Patient reports that pain often interferes with her ability to complete work    Time  8    Period  Weeks    Status  New    Target Date  10/06/19      PT LONG TERM GOAL #4   Title  Patient will be able to sit for >/= 1 hour in order to drive long distances for work.    Baseline  Patient reports that she is able to sit for < 5 mins    Time  8    Period  Weeks    Status  New    Target Date  10/06/19            Plan - 09/02/19 1613    Clinical Impression Statement  pt reports increased soreness located over the spinous process of T4-T8 with increased tension located around bil paraspinals. due to increased pain and tension focused on pain relief manual techniques followed with E-stim and MHP. pt reports fluctuating pain that could have a psychosocial aspect/ stress but hasn't had any imaging either and would benefit from imagine to rule out any potentially  osseous issues.    PT Treatment/Interventions  ADLs/Self Care Home Management;Cryotherapy;Electrical Stimulation;Iontophoresis 4mg /ml Dexamethasone;Moist Heat;Traction;Ultrasound;Stair training;Gait training;Functional mobility training;Therapeutic activities;Therapeutic exercise;Manual techniques;Passive range of motion;Dry needling;Spinal Manipulations;Joint Manipulations;Patient/family education    PT Next Visit Plan  Assess how patient responded to session, thoracic mobility/ mobs, STW PRN, core strengthening,  posterior shoulder strengthenig, review posture, how was stim/ MHP    PT Home Exercise Plan  Upper Traps and levator stretch, Cat/Cow, [employer], trigger point release.    Consulted and Agree with Plan of Care  Patient       Patient will benefit from skilled therapeutic intervention in order to improve the following deficits and impairments:  Decreased activity tolerance, Decreased endurance, Decreased mobility, Decreased range of motion, Improper body mechanics, Postural dysfunction, Pain, Increased fascial restricitons, Difficulty walking  Visit Diagnosis: Pain in thoracic spine  Pain in right hand     Problem List Patient Active Problem List   Diagnosis Date Noted  . Fatigue 08/12/2019  . Back pain 07/30/2019  . Palpitation 03/11/2019  . Asthma due to seasonal allergies 08/18/2018  . Post-operative state 09/02/2017  . History of endometriosis 03/28/2017  . Hypertension 03/28/2017  . Abdominal pain in female 03/21/2017  . Vaginal discharge 03/07/2017  . Hematuria 02/01/2017  . Urgency of micturition 01/31/2017  . History of obstructive sleep apnea   . Diarrhea 12/15/2015  . History of laparoscopic adjustable gastric banding   .  Partial small bowel obstruction ([place]) 11/17/2015  . Healthcare maintenance 06/27/2015  . Routine health maintenance 04/03/2015  . Perforated appendicitis s/p lap appy 07/28/2014 07/28/2014  . Hypokalemia 06/17/2013   Starr Lake PT, DPT, LAT, ATC  09/02/19  4:18 PM      [facility] [address] [place], [place], [address] Phone: [phone]   Fax:  [phone]  Name: Meagan Dean MRN: [id number] Date of Birth: [birth year]

## 2019-09-06 ENCOUNTER — Ambulatory Visit: Payer: Self-pay | Admitting: Physical Therapy

## 2019-09-08 ENCOUNTER — Telehealth: Payer: Self-pay | Admitting: Physical Therapy

## 2019-09-08 ENCOUNTER — Ambulatory Visit: Payer: Self-pay | Admitting: Physical Therapy

## 2019-09-08 NOTE — Telephone Encounter (Signed)
Had a client emergency at work and forgot to call. Sadarius Norman C. Rayne Loiseau PT, DPT 09/08/19 5:05 PM

## 2019-09-15 ENCOUNTER — Ambulatory Visit: Payer: No Typology Code available for payment source | Attending: Internal Medicine | Admitting: Physical Therapy

## 2019-09-15 ENCOUNTER — Encounter: Payer: Self-pay | Admitting: Physical Therapy

## 2019-09-15 ENCOUNTER — Other Ambulatory Visit: Payer: Self-pay

## 2019-09-15 DIAGNOSIS — M546 Pain in thoracic spine: Secondary | ICD-10-CM | POA: Diagnosis present

## 2019-09-15 DIAGNOSIS — M79641 Pain in right hand: Secondary | ICD-10-CM | POA: Insufficient documentation

## 2019-09-15 NOTE — Therapy (Signed)
[place], [place], [address] Phone: [phone]   Fax:  [phone]  Physical Therapy Treatment  Patient Details  Name: Meagan Dean MRN: [id number] Date of Birth: 09/29/1970 Referring Provider (PT): Sid Falcon, MD    Encounter Date: 09/15/2019  PT End of Session - 09/15/19 1634    Visit Number  5    Number of Visits  17    Date for PT Re-Evaluation  09/08/19    PT Start Time  1634    PT Stop Time  1708    PT Time Calculation (min)  34 min    Activity Tolerance  Patient tolerated treatment well    Behavior During Therapy  [facility] for tasks assessed/performed       Past Medical History:  Diagnosis Date  . Anemia   . ASCUS with positive high risk HPV cervical 03/15/2017  . Asthma due to seasonal allergies    MILD-- JUST GOT INHALER-not used yet -allergy induced  . Calculus of ureter 06/16/2013  . Endometriosis of pelvis   . Fibroids 09/02/2017  . GERD (gastroesophageal reflux disease)    diet controlled  . H/O hiatal hernia   . Headache    MIGRAINES - none since weight loss surgery  . History of endometriosis 03/28/2017  . History of hypertension    NO ISSUES SINCE WT LOSS AFTER GASTRIC BANDING  . History of kidney stones    surgery to remove  . History of obstructive sleep apnea    DX'D PRIOR TO GASTRIC BANDING  2010 --  NO ISSUES SINCE WT LOSS  . Hypertension 03/28/2017  . Low potassium syndrome    hopsitalized for 3 days  . Right ureteral stone   . Seasonal allergies   . Sinus headache   . Sleep apnea    prior to weight loss surgery, does not have cpap  . Urgency of urination     Past Surgical History:  Procedure Laterality Date  . ABDOMINAL HYSTERECTOMY    . APPENDECTOMY    . bowel obstruction  2017  . CYSTOSCOPY WITH RETROGRADE PYELOGRAM, URETEROSCOPY AND STENT PLACEMENT Right 06/18/2013   Procedure: CYSTOSCOPY WITH RETROGRADE PYELOGRAM, URETEROSCOPY AND STENT PLACEMENT;  Surgeon:  Franchot Gallo, MD;  Location: [facility];  Service: Urology;  Laterality: Right;  . CYSTOSCOPY WITH RETROGRADE PYELOGRAM, URETEROSCOPY AND STENT PLACEMENT Right 07/05/2013   Procedure: CYSTOSCOPY , URETEROSCOPY ANDstone extraction;  Surgeon: Franchot Gallo, MD;  Location: [facility];  Service: Urology;  Laterality: Right;  . EXPLORATORY LAPARTOMY/  MYOMECTOMY  2005  . HYSTERECTOMY ABDOMINAL WITH SALPINGECTOMY Bilateral 09/02/2017   Procedure: HYSTERECTOMY ABDOMINAL WITH SALPINGECTOMY;  Surgeon: Lavonia Drafts, MD;  Location: [place] [facility];  Service: Gynecology;  Laterality: Bilateral;  . LAPAROSCOPIC APPENDECTOMY N/A 07/28/2014   Procedure: APPENDECTOMY LAPAROSCOPIC;  Surgeon: Jackolyn Confer, MD;  Location: [facility];  Service: General;  Laterality: N/A;  . LAPAROSCOPIC GASTRIC BANDING  06-06-2008  . LAPAROSCOPY /  OVARIAN CYSTECTOMY/  LASER ABLATION ENDOMETRIOSIS  2003  . LAPAROTOMY N/A 11/24/2015   Procedure: EXPLORATORY LAPAROTOMY;  Surgeon: Georganna Skeans, MD;  Location: [place];  Service: General;  Laterality: N/A;  . LYSIS OF ADHESION N/A 11/24/2015   Procedure: LYSIS OF ADHESION;  Surgeon: Georganna Skeans, MD;  Location: [place];  Service: General;  Laterality: N/A;  . LYSIS OF ADHESION N/A 09/02/2017   Procedure: LYSIS OF ADHESION;  Surgeon: Lavonia Drafts, MD;  Location: [place] [facility];  Service: Gynecology;  Laterality: N/A;  .  wisdom teeth ext      There were no vitals filed for this visit.  Subjective Assessment - 09/15/19 1635    Subjective  Patient reports that she is doing a lot better. She reports that e-stim helped a lot.    Patient Stated Goals  I want my back to feel better, decrease pain in back to get back to functional activities.    Currently in Pain?  Yes    Pain Score  2     Pain Location  Thoracic    Pain Orientation  Mid    Pain Descriptors / Indicators  Aching;Sore    Pain Type  Chronic pain    Pain Onset  More than a month ago    Pain Frequency   Intermittent    Aggravating Factors   driving, prolonged sitting    Pain Relieving Factors  sleeping    Effect of Pain on Daily Activities  Tiring out a lot, driving, walking                       [facility] Adult PT Treatment/Exercise - 09/15/19 0001      Lumbar Exercises: Supine   Other Supine Lumbar Exercises  Serratus punches    4 sets of 10   Other Supine Lumbar Exercises  Open book stretch    1 set of 10, both     Lumbar Exercises: Quadruped   Other Quadruped Lumbar Exercises  Bird Dog    1 set of 10     Shoulder Exercises: Seated   Retraction  Strengthening;Both;20 reps;Theraband    Theraband Level (Shoulder Retraction)  Level 2 (Red)      Moist Heat Therapy   Number Minutes Moist Heat  15 Minutes    Moist Heat Location  Other (comment)   Upper thoracic      Electrical Stimulation   Electrical Stimulation Location  upper thoracic spine    Electrical Stimulation Action  IFC    Electrical Stimulation Parameters  L13 x 15 min, 80-150, 100% scan     Electrical Stimulation Goals  Pain               PT Short Term Goals - 08/11/19 1759      PT SHORT TERM GOAL #1   Title  Patient will be able to initiate HEP    Time  4    Period  Weeks    Status  New    Target Date  09/08/19      PT SHORT TERM GOAL #2   Title  Patient will be able to increase thoracic flexion >/= 5 degrees in order to decrease pain when lifting and transferring objects    Time  4    Period  Weeks    Status  New    Target Date  09/08/19      PT SHORT TERM GOAL #3   Title  Patient will be able to demonstrate appropriate sitting posture    Baseline  Rounded shoulders and forward head posture    Time  4    Period  Weeks    Status  New    Target Date  09/08/19      PT SHORT TERM GOAL #4   Title  Patient will report that she is able to sit comfotably for >/= 10 mins in order to drive for work    Baseline  <5 mins    Time  4    Period  Weeks    Status  New    Target Date   09/08/19        PT Long Term Goals - 08/11/19 1806      PT LONG TERM GOAL #1   Title  Patient will report worst pain as </= 3/10 to improve quality of life    Time  8    Period  Weeks    Status  New    Target Date  10/06/19      PT LONG TERM GOAL #2   Title  Patient will achieve >/= 10 degrees of flexion and extension in order to decrease pain with functional activities    Baseline  Flexion: 5 Extension: 10    Time  8    Period  Weeks    Status  New    Target Date  10/06/19      PT LONG TERM GOAL #3   Title  Patient will report that she is able to type on her computer for school and work with no greater than a 4/10 pain    Baseline  Patient reports that pain often interferes with her ability to complete work    Time  8    Period  Weeks    Status  New    Target Date  10/06/19      PT LONG TERM GOAL #4   Title  Patient will be able to sit for >/= 1 hour in order to drive long distances for work.    Baseline  Patient reports that she is able to sit for < 5 mins    Time  8    Period  Weeks    Status  New    Target Date  10/06/19            Plan - 09/15/19 1808    Clinical Impression Statement  Patient reports to the clinic with decreased thoracic pain. She demonstrates an increase in mobility and activity tolerance. She was able to perform all of the exercises, but required a great amount cuing and postural correction. She responded favorably to IFC. Patient is progressing well. She would benefit from PT to further address pain and ROM deficits.    Personal Factors and Comorbidities  Comorbidity 3+    Comorbidities  HTN, Depression, Abdominal pain    Examination-Activity Limitations  Bathing;Bend;Dressing;Hygiene/Grooming;Lift;Reach Overhead;Sit;Stand;Transfers    Examination-Participation Restrictions  Cleaning;Community Activity;Driving;Laundry    Stability/Clinical Decision Making  Unstable/Unpredictable    Clinical Decision Making  High    Rehab Potential  Good     PT Frequency  2x / week    PT Duration  8 weeks    PT Treatment/Interventions  ADLs/Self Care Home Management;Cryotherapy;Electrical Stimulation;Iontophoresis 4mg /ml Dexamethasone;Moist Heat;Traction;Ultrasound;Stair training;Gait training;Functional mobility training;Therapeutic activities;Therapeutic exercise;Manual techniques;Passive range of motion;Dry needling;Spinal Manipulations;Joint Manipulations;Patient/family education    PT Next Visit Plan  Assess how patient responded to session, thoracic mobility/ mobs, STW PRN, core strengthening, posterior shoulder strengthenig, review posture, how was stim/ MHP    PT Home Exercise Plan  Upper Traps and levator stretch, Cat/Cow, [employer], trigger point release.    Consulted and Agree with Plan of Care  Patient       Patient will benefit from skilled therapeutic intervention in order to improve the following deficits and impairments:  Decreased activity tolerance, Decreased endurance, Decreased mobility, Decreased range of motion, Improper body mechanics, Postural dysfunction, Pain, Increased fascial restricitons, Difficulty walking  Visit Diagnosis: Pain in thoracic spine  Pain in right  hand     Problem List Patient Active Problem List   Diagnosis Date Noted  . Fatigue 08/12/2019  . Back pain 07/30/2019  . Palpitation 03/11/2019  . Asthma due to seasonal allergies 08/18/2018  . Post-operative state 09/02/2017  . History of endometriosis 03/28/2017  . Hypertension 03/28/2017  . Abdominal pain in female 03/21/2017  . Vaginal discharge 03/07/2017  . Hematuria 02/01/2017  . Urgency of micturition 01/31/2017  . History of obstructive sleep apnea   . Diarrhea 12/15/2015  . History of laparoscopic adjustable gastric banding   . Partial small bowel obstruction ([place]) 11/17/2015  . Healthcare maintenance 06/27/2015  . Routine health maintenance 04/03/2015  . Perforated appendicitis s/p lap appy 07/28/2014 07/28/2014  . Hypokalemia  06/17/2013    Laveda Norman, SPT 09/15/2019, 6:16 PM  [facility] [address] [place], [place], [address] Phone: [phone]   Fax:  [phone]  Name: Meagan Dean MRN: [id number] Date of Birth: Nov 05, 1970

## 2019-09-21 ENCOUNTER — Other Ambulatory Visit: Payer: Self-pay

## 2019-09-21 ENCOUNTER — Encounter: Payer: Self-pay | Admitting: Physical Therapy

## 2019-09-21 ENCOUNTER — Ambulatory Visit: Payer: No Typology Code available for payment source | Admitting: Physical Therapy

## 2019-09-21 DIAGNOSIS — M546 Pain in thoracic spine: Secondary | ICD-10-CM

## 2019-09-22 NOTE — Therapy (Signed)
[place], [place], [address] Phone: [phone]   Fax:  [phone]  Physical Therapy Treatment  Patient Details  Name: Meagan Dean MRN: [id number] Date of Birth: [birth year] Referring Provider (PT): Sid Falcon, MD    Encounter Date: 09/21/2019  PT End of Session - 09/21/19 1637    Visit Number  6    Number of Visits  17    Date for PT Re-Evaluation  10/08/19    Authorization Type  Med pay    PT Start Time  1635    PT Stop Time  1715    PT Time Calculation (min)  40 min    Activity Tolerance  Patient tolerated treatment well    Behavior During Therapy  [facility] for tasks assessed/performed       Past Medical History:  Diagnosis Date  . Anemia   . ASCUS with positive high risk HPV cervical 03/15/2017  . Asthma due to seasonal allergies    MILD-- JUST GOT INHALER-not used yet -allergy induced  . Calculus of ureter 06/16/2013  . Endometriosis of pelvis   . Fibroids 09/02/2017  . GERD (gastroesophageal reflux disease)    diet controlled  . H/O hiatal hernia   . Headache    MIGRAINES - none since weight loss surgery  . History of endometriosis 03/28/2017  . History of hypertension    NO ISSUES SINCE WT LOSS AFTER GASTRIC BANDING  . History of kidney stones    surgery to remove  . History of obstructive sleep apnea    DX'D PRIOR TO GASTRIC BANDING  2010 --  NO ISSUES SINCE WT LOSS  . Hypertension 03/28/2017  . Low potassium syndrome    hopsitalized for 3 days  . Right ureteral stone   . Seasonal allergies   . Sinus headache   . Sleep apnea    prior to weight loss surgery, does not have cpap  . Urgency of urination     Past Surgical History:  Procedure Laterality Date  . ABDOMINAL HYSTERECTOMY    . APPENDECTOMY    . bowel obstruction  2017  . CYSTOSCOPY WITH RETROGRADE PYELOGRAM, URETEROSCOPY AND STENT PLACEMENT Right 06/18/2013   Procedure: CYSTOSCOPY WITH RETROGRADE PYELOGRAM,  URETEROSCOPY AND STENT PLACEMENT;  Surgeon: Franchot Gallo, MD;  Location: [facility];  Service: Urology;  Laterality: Right;  . CYSTOSCOPY WITH RETROGRADE PYELOGRAM, URETEROSCOPY AND STENT PLACEMENT Right 07/05/2013   Procedure: CYSTOSCOPY , URETEROSCOPY ANDstone extraction;  Surgeon: Franchot Gallo, MD;  Location: [facility];  Service: Urology;  Laterality: Right;  . EXPLORATORY LAPARTOMY/  MYOMECTOMY  2005  . HYSTERECTOMY ABDOMINAL WITH SALPINGECTOMY Bilateral 09/02/2017   Procedure: HYSTERECTOMY ABDOMINAL WITH SALPINGECTOMY;  Surgeon: Lavonia Drafts, MD;  Location: [facility];  Service: Gynecology;  Laterality: Bilateral;  . LAPAROSCOPIC APPENDECTOMY N/A 07/28/2014   Procedure: APPENDECTOMY LAPAROSCOPIC;  Surgeon: Jackolyn Confer, MD;  Location: [facility];  Service: General;  Laterality: N/A;  . LAPAROSCOPIC GASTRIC BANDING  06-06-2008  . LAPAROSCOPY /  OVARIAN CYSTECTOMY/  LASER ABLATION ENDOMETRIOSIS  2003  . LAPAROTOMY N/A 11/24/2015   Procedure: EXPLORATORY LAPAROTOMY;  Surgeon: Georganna Skeans, MD;  Location: [place];  Service: General;  Laterality: N/A;  . LYSIS OF ADHESION N/A 11/24/2015   Procedure: LYSIS OF ADHESION;  Surgeon: Georganna Skeans, MD;  Location: [place];  Service: General;  Laterality: N/A;  . LYSIS OF ADHESION N/A 09/02/2017   Procedure: LYSIS OF ADHESION;  Surgeon: Lavonia Drafts, MD;  Location:  [facility];  Service: Gynecology;  Laterality: N/A;  . wisdom teeth ext      There were no vitals filed for this visit.  Subjective Assessment - 09/21/19 1636    Subjective  I went to lift some bags on Thursday and I felt something pop in my low back and I could hardly walk on Friday. Wore off by Sunday.    Patient Stated Goals  I want my back to feel better, decrease pain in back to get back to functional activities.    Currently in Pain?  Yes    Pain Location  Back    Pain Orientation  Mid    Pain Descriptors / Indicators  Sore         [facility] PT  Assessment - 09/21/19 0001      Assessment   Medical Diagnosis  Acute bilateral thoracic back pain     Referring Provider (PT)  Sid Falcon, MD     Onset Date/Surgical Date  07/22/19    Hand Dominance  Right      Prior Function   Vocation Requirements  driving, Typing      Cognition   Overall Cognitive Status  Within Functional Limits for tasks assessed      Observation/Other Assessments   Focus on Therapeutic Outcomes (FOTO)   55% limited      Posture/Postural Control   Posture Comments  able to demo proper resting posture but looks down at phone and increases kyphosis                   [facility] Adult PT Treatment/Exercise - 09/21/19 0001      Shoulder Exercises: Standing   Other Standing Exercises  rows- blue, ext- green, ER-red      Manual Therapy   Joint Mobilization  thoracic spine & ribs    Soft tissue mobilization  Rt levator scapula               PT Short Term Goals - 09/21/19 1638      PT SHORT TERM GOAL #1   Title  Patient will be able to initiate HEP    Baseline  not every day    Status  Partially Met      PT SHORT TERM GOAL #2   Title  Patient will be able to increase thoracic flexion >/= 5 degrees in order to decrease pain when lifting and transferring objects    Status  Achieved      PT SHORT TERM GOAL #3   Title  Patient will be able to demonstrate appropriate sitting posture    Status  Achieved      PT SHORT TERM GOAL #4   Title  Patient will report that she is able to sit comfotably for >/= 10 mins in order to drive for work    Baseline  20-30 min    Status  Achieved        PT Long Term Goals - 09/21/19 1640      PT LONG TERM GOAL #1   Title  Patient will report worst pain as </= 3/10 to improve quality of life    Baseline  up to 8-9 about 2 weeks ago    Status  On-going    Target Date  10/06/19      PT LONG TERM GOAL #2   Title  Patient will achieve >/= 10 degrees of flexion and extension in order to decrease pain  with functional activities  Baseline  WFL    Status  Achieved      PT LONG TERM GOAL #3   Title  Patient will report that she is able to type on her computer for school and work with no greater than a 4/10 pain    Baseline  still bothersome to work ability    Status  On-going      PT [place] #4   Title  Patient will be able to sit for >/= 1 hour in order to drive long distances for work.    Baseline  20-30 minutes reported today    Status  On-going            Plan - 09/22/19 0629    Clinical Impression Statement  We discussed TENS and work Scientist, water quality today and pt was provided with printouts from [employer] for examples of what to look for. Pt is making progress toward her goals and is resting in a more appropriate posture. Will benefit form further training to improve scapular mobility and continue to decrease pain with ADLs. 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    PT Frequency  2x / week    PT Duration  2 weeks    PT Treatment/Interventions  ADLs/Self Care Home Management;Cryotherapy;Electrical Stimulation;Iontophoresis 83m/ml Dexamethasone;Moist Heat;Traction;Ultrasound;Stair training;Gait training;Functional mobility training;Therapeutic activities;Therapeutic exercise;Manual techniques;Passive range of motion;Dry needling;Spinal Manipulations;Joint Manipulations;Patient/family education    PT Next Visit Plan  continue thoracic mobilization, scapular mobility & training    PT Home Exercise Plan  Upper Traps and levator stretch, Cat/Cow, CNeta Mends trigger point release.    Consulted and Agree with Plan of Care  Patient       Patient will benefit from skilled therapeutic intervention in order to improve the following deficits and impairments:  Decreased activity tolerance, Decreased endurance, Decreased mobility, Decreased range of motion,  Improper body mechanics, Postural dysfunction, Pain, Increased fascial restricitons, Difficulty walking  Visit Diagnosis: Pain in thoracic spine - Plan: PT plan of care cert/re-cert     Problem List Patient Active Problem List   Diagnosis Date Noted  . Fatigue 08/12/2019  . Back pain 07/30/2019  . Palpitation 03/11/2019  . Asthma due to seasonal allergies 08/18/2018  . Post-operative state 09/02/2017  . History of endometriosis 03/28/2017  . Hypertension 03/28/2017  . Abdominal pain in female 03/21/2017  . Vaginal discharge 03/07/2017  . Hematuria 02/01/2017  . Urgency of micturition 01/31/2017  . History of obstructive sleep apnea   . Diarrhea 12/15/2015  . History of laparoscopic adjustable gastric banding   . Partial small bowel obstruction ([place] 11/17/2015  . Healthcare maintenance 06/27/2015  . Routine health maintenance 04/03/2015  . Perforated appendicitis s/p lap appy 07/28/2014 07/28/2014  . Hypokalemia 06/17/2013    Meagan Dean PT, DPT 09/22/19 6:39 AM   [facility] [address]Phone: [phone]  Fax:  [phone] Name: Meagan WILLNERMRN: [id number] of Birth: 101-Aug-1972

## 2019-09-23 ENCOUNTER — Ambulatory Visit: Payer: No Typology Code available for payment source | Admitting: Physical Therapy

## 2019-09-28 ENCOUNTER — Other Ambulatory Visit: Payer: Self-pay

## 2019-09-28 ENCOUNTER — Encounter: Payer: Self-pay | Admitting: Physical Therapy

## 2019-09-28 ENCOUNTER — Ambulatory Visit: Payer: No Typology Code available for payment source | Admitting: Physical Therapy

## 2019-09-28 DIAGNOSIS — M546 Pain in thoracic spine: Secondary | ICD-10-CM

## 2019-09-28 DIAGNOSIS — M79641 Pain in right hand: Secondary | ICD-10-CM

## 2019-09-28 NOTE — Therapy (Signed)
[place], [place], [address] Phone: [phone]   Fax:  [phone]  Physical Therapy Treatment  Patient Details  Name: Meagan Dean MRN: [id number] Date of Birth: [birth year] Referring Provider (PT): Sid Falcon, MD    Encounter Date: 09/28/2019  PT End of Session - 09/28/19 1637    Visit Number  7    Number of Visits  17    Date for PT Re-Evaluation  10/08/19    PT Start Time  1633    PT Stop Time  1714    PT Time Calculation (min)  41 min    Activity Tolerance  Patient tolerated treatment well    Behavior During Therapy  [facility] for tasks assessed/performed       Past Medical History:  Diagnosis Date  . Anemia   . ASCUS with positive high risk HPV cervical 03/15/2017  . Asthma due to seasonal allergies    MILD-- JUST GOT INHALER-not used yet -allergy induced  . Calculus of ureter 06/16/2013  . Endometriosis of pelvis   . Fibroids 09/02/2017  . GERD (gastroesophageal reflux disease)    diet controlled  . H/O hiatal hernia   . Headache    MIGRAINES - none since weight loss surgery  . History of endometriosis 03/28/2017  . History of hypertension    NO ISSUES SINCE WT LOSS AFTER GASTRIC BANDING  . History of kidney stones    surgery to remove  . History of obstructive sleep apnea    DX'D PRIOR TO GASTRIC BANDING  2010 --  NO ISSUES SINCE WT LOSS  . Hypertension 03/28/2017  . Low potassium syndrome    hopsitalized for 3 days  . Right ureteral stone   . Seasonal allergies   . Sinus headache   . Sleep apnea    prior to weight loss surgery, does not have cpap  . Urgency of urination     Past Surgical History:  Procedure Laterality Date  . ABDOMINAL HYSTERECTOMY    . APPENDECTOMY    . bowel obstruction  2017  . CYSTOSCOPY WITH RETROGRADE PYELOGRAM, URETEROSCOPY AND STENT PLACEMENT Right 06/18/2013   Procedure: CYSTOSCOPY WITH RETROGRADE PYELOGRAM, URETEROSCOPY AND STENT PLACEMENT;   Surgeon: Franchot Gallo, MD;  Location: [facility];  Service: Urology;  Laterality: Right;  . CYSTOSCOPY WITH RETROGRADE PYELOGRAM, URETEROSCOPY AND STENT PLACEMENT Right 07/05/2013   Procedure: CYSTOSCOPY , URETEROSCOPY ANDstone extraction;  Surgeon: Franchot Gallo, MD;  Location: [facility];  Service: Urology;  Laterality: Right;  . EXPLORATORY LAPARTOMY/  MYOMECTOMY  2005  . HYSTERECTOMY ABDOMINAL WITH SALPINGECTOMY Bilateral 09/02/2017   Procedure: HYSTERECTOMY ABDOMINAL WITH SALPINGECTOMY;  Surgeon: Lavonia Drafts, MD;  Location: [facility];  Service: Gynecology;  Laterality: Bilateral;  . LAPAROSCOPIC APPENDECTOMY N/A 07/28/2014   Procedure: APPENDECTOMY LAPAROSCOPIC;  Surgeon: Jackolyn Confer, MD;  Location: [facility];  Service: General;  Laterality: N/A;  . LAPAROSCOPIC GASTRIC BANDING  06-06-2008  . LAPAROSCOPY /  OVARIAN CYSTECTOMY/  LASER ABLATION ENDOMETRIOSIS  2003  . LAPAROTOMY N/A 11/24/2015   Procedure: EXPLORATORY LAPAROTOMY;  Surgeon: Georganna Skeans, MD;  Location: [place];  Service: General;  Laterality: N/A;  . LYSIS OF ADHESION N/A 11/24/2015   Procedure: LYSIS OF ADHESION;  Surgeon: Georganna Skeans, MD;  Location: [place];  Service: General;  Laterality: N/A;  . LYSIS OF ADHESION N/A 09/02/2017   Procedure: LYSIS OF ADHESION;  Surgeon: Lavonia Drafts, MD;  Location: [facility];  Service: Gynecology;  Laterality: N/A;  .  wisdom teeth ext      There were no vitals filed for this visit.  Subjective Assessment - 09/28/19 1637    Subjective  "I am doing okay today. I am doing better today than I did last time"    Patient Stated Goals  I want my back to feel better, decrease pain in back to get back to functional activities.    Currently in Pain?  Yes    Pain Location  Back    Pain Onset  More than a month ago    Pain Frequency  Intermittent    Aggravating Factors   driving, for prolonged periods    Pain Relieving Factors  sleeping         [facility] PT  Assessment - 09/28/19 0001      Assessment   Medical Diagnosis  Acute bilateral thoracic back pain     Referring Provider (PT)  Sid Falcon, MD     Onset Date/Surgical Date  07/22/19                    [facility] Adult PT Treatment/Exercise - 09/28/19 0001      Neck Exercises: Machines for Strengthening   UBE (Upper Arm Bike)  L 2 x 79mn       Neck Exercises: Supine   Other Supine Exercise  foam roll rountine (using rolled towels), ceiling punches, horizontal abd/ add, alternating ceiling punches, x to Y, and back stroke 1 x 15 ea.      Shoulder Exercises: Supine   Other Supine Exercises  thoracic extension over bolster with bil shoulder flexion combined with inhalation with flexion and exhalation with extension 2 x 10      Shoulder Exercises: Seated   Retraction  12 reps;Theraband    Theraband Level (Shoulder Retraction)  Level 2 (Red)      Manual Therapy   Joint Mobilization  T4-T12 and L1-L5 PA grade III    Soft tissue mobilization  R upper trap/ levator scapuale STW and along bil thoracolumbar paraspinals      Neck Exercises: Stretches   Upper Trapezius Stretch  2 reps;30 seconds;Right    Levator Stretch  2 reps;Left;30 seconds               PT Short Term Goals - 09/21/19 1638      PT SHORT TERM GOAL #1   Title  Patient will be able to initiate HEP    Baseline  not every day    Status  Partially Met      PT SHORT TERM GOAL #2   Title  Patient will be able to increase thoracic flexion >/= 5 degrees in order to decrease pain when lifting and transferring objects    Status  Achieved      PT SHORT TERM GOAL #3   Title  Patient will be able to demonstrate appropriate sitting posture    Status  Achieved      PT SHORT TERM GOAL #4   Title  Patient will report that she is able to sit comfotably for >/= 10 mins in order to drive for work    Baseline  20-30 min    Status  Achieved        PT Long Term Goals - 09/21/19 1640      PT LONG TERM GOAL  #1   Title  Patient will report worst pain as </= 3/10 to improve quality of life    Baseline  up to  8-9 about 2 weeks ago    Status  On-going    Target Date  10/06/19      PT LONG TERM GOAL #2   Title  Patient will achieve >/= 10 degrees of flexion and extension in order to decrease pain with functional activities    Baseline  WFL    Status  Achieved      PT LONG TERM GOAL #3   Title  Patient will report that she is able to type on her computer for school and work with no greater than a 4/10 pain    Baseline  still bothersome to work ability    Status  On-going      PT [place] #4   Title  Patient will be able to sit for >/= 1 hour in order to drive long distances for work.    Baseline  20-30 minutes reported today    Status  On-going            Plan - 09/28/19 1717    Clinical Impression Statement  pt reports continued pain inthe mid thoracic region and low back. Continued STW focusing along thoracolumbar paraspinals, and continued working on posterior shoulder strengthening to promote efficient posture. she responded well to foam roll rountine using rolled up towels to isolate scapulothroacic region.    Examination-Activity Limitations  Bathing;Bend;Dressing;Hygiene/Grooming;Lift;Reach Overhead;Sit;Stand;Transfers    PT Next Visit Plan  continue thoracic mobilization, scapular mobility & training, if she responded well to foam roll routine give as HEP    PT Home Exercise Plan  Upper Traps and levator stretch, Cat/Cow, [employer], trigger point release.    Consulted and Agree with Plan of Care  Patient       Patient will benefit from skilled therapeutic intervention in order to improve the following deficits and impairments:  Decreased activity tolerance, Decreased endurance, Decreased mobility, Decreased range of motion, Improper body mechanics, Postural dysfunction, Pain, Increased fascial restricitons, Difficulty walking  Visit Diagnosis: Pain in thoracic  spine  Pain in right hand     Problem List Patient Active Problem List   Diagnosis Date Noted  . Fatigue 08/12/2019  . Back pain 07/30/2019  . Palpitation 03/11/2019  . Asthma due to seasonal allergies 08/18/2018  . Post-operative state 09/02/2017  . History of endometriosis 03/28/2017  . Hypertension 03/28/2017  . Abdominal pain in female 03/21/2017  . Vaginal discharge 03/07/2017  . Hematuria 02/01/2017  . Urgency of micturition 01/31/2017  . History of obstructive sleep apnea   . Diarrhea 12/15/2015  . History of laparoscopic adjustable gastric banding   . Partial small bowel obstruction ([place]) 11/17/2015  . Healthcare maintenance 06/27/2015  . Routine health maintenance 04/03/2015  . Perforated appendicitis s/p lap appy 07/28/2014 07/28/2014  . Hypokalemia 06/17/2013   Starr [place] PT, DPT, LAT, ATC  09/28/19  5:27 PM      [facility] [address] [place], [place], [address] Phone: [phone]   Fax:  [phone]  Name: Meagan Dean MRN: [id number] Date of Birth: Jul 23, 1970

## 2019-09-30 ENCOUNTER — Ambulatory Visit: Payer: No Typology Code available for payment source | Admitting: Physical Therapy

## 2019-09-30 ENCOUNTER — Encounter: Payer: Self-pay | Admitting: Physical Therapy

## 2019-09-30 ENCOUNTER — Other Ambulatory Visit: Payer: Self-pay

## 2019-09-30 DIAGNOSIS — M546 Pain in thoracic spine: Secondary | ICD-10-CM | POA: Diagnosis not present

## 2019-09-30 DIAGNOSIS — M79641 Pain in right hand: Secondary | ICD-10-CM

## 2019-09-30 NOTE — Therapy (Signed)
[place], [place], [address] Phone: [phone]   Fax:  [phone]  Physical Therapy Treatment  Patient Details  Name: Meagan Dean MRN: [id number] Date of Birth: 1971-02-10 Referring Provider (PT): Sid Falcon, MD    Encounter Date: 09/30/2019  PT End of Session - 09/30/19 1639    Visit Number  8    Number of Visits  17    Date for PT Re-Evaluation  10/08/19    Authorization Type  Med pay    PT Start Time  1635    PT Stop Time  1708    PT Time Calculation (min)  33 min    Activity Tolerance  Patient tolerated treatment well    Behavior During Therapy  [facility] for tasks assessed/performed       Past Medical History:  Diagnosis Date  . Anemia   . ASCUS with positive high risk HPV cervical 03/15/2017  . Asthma due to seasonal allergies    MILD-- JUST GOT INHALER-not used yet -allergy induced  . Calculus of ureter 06/16/2013  . Endometriosis of pelvis   . Fibroids 09/02/2017  . GERD (gastroesophageal reflux disease)    diet controlled  . H/O hiatal hernia   . Headache    MIGRAINES - none since weight loss surgery  . History of endometriosis 03/28/2017  . History of hypertension    NO ISSUES SINCE WT LOSS AFTER GASTRIC BANDING  . History of kidney stones    surgery to remove  . History of obstructive sleep apnea    DX'D PRIOR TO GASTRIC BANDING  2010 --  NO ISSUES SINCE WT LOSS  . Hypertension 03/28/2017  . Low potassium syndrome    hopsitalized for 3 days  . Right ureteral stone   . Seasonal allergies   . Sinus headache   . Sleep apnea    prior to weight loss surgery, does not have cpap  . Urgency of urination     Past Surgical History:  Procedure Laterality Date  . ABDOMINAL HYSTERECTOMY    . APPENDECTOMY    . bowel obstruction  2017  . CYSTOSCOPY WITH RETROGRADE PYELOGRAM, URETEROSCOPY AND STENT PLACEMENT Right 06/18/2013   Procedure: CYSTOSCOPY WITH RETROGRADE PYELOGRAM,  URETEROSCOPY AND STENT PLACEMENT;  Surgeon: Franchot Gallo, MD;  Location: [facility];  Service: Urology;  Laterality: Right;  . CYSTOSCOPY WITH RETROGRADE PYELOGRAM, URETEROSCOPY AND STENT PLACEMENT Right 07/05/2013   Procedure: CYSTOSCOPY , URETEROSCOPY ANDstone extraction;  Surgeon: Franchot Gallo, MD;  Location: [facility];  Service: Urology;  Laterality: Right;  . EXPLORATORY LAPARTOMY/  MYOMECTOMY  2005  . HYSTERECTOMY ABDOMINAL WITH SALPINGECTOMY Bilateral 09/02/2017   Procedure: HYSTERECTOMY ABDOMINAL WITH SALPINGECTOMY;  Surgeon: Lavonia Drafts, MD;  Location: [facility];  Service: Gynecology;  Laterality: Bilateral;  . LAPAROSCOPIC APPENDECTOMY N/A 07/28/2014   Procedure: APPENDECTOMY LAPAROSCOPIC;  Surgeon: Jackolyn Confer, MD;  Location: [facility];  Service: General;  Laterality: N/A;  . LAPAROSCOPIC GASTRIC BANDING  06-06-2008  . LAPAROSCOPY /  OVARIAN CYSTECTOMY/  LASER ABLATION ENDOMETRIOSIS  2003  . LAPAROTOMY N/A 11/24/2015   Procedure: EXPLORATORY LAPAROTOMY;  Surgeon: Georganna Skeans, MD;  Location: [facility];  Service: General;  Laterality: N/A;  . LYSIS OF ADHESION N/A 11/24/2015   Procedure: LYSIS OF ADHESION;  Surgeon: Georganna Skeans, MD;  Location: [facility];  Service: General;  Laterality: N/A;  . LYSIS OF ADHESION N/A 09/02/2017   Procedure: LYSIS OF ADHESION;  Surgeon: Lavonia Drafts, MD;  Location:  [facility];  Service: Gynecology;  Laterality: N/A;  . wisdom teeth ext      There were no vitals filed for this visit.  Subjective Assessment - 09/30/19 1638    Subjective  It's just in the middle of my back. I have some lower back pain but that has been going on for years.    Patient Stated Goals  I want my back to feel better, decrease pain in back to get back to functional activities.    Currently in Pain?  Yes    Pain Score  5     Pain Location  Back    Pain Orientation  Mid    Pain Descriptors / Indicators  Aching;Burning         [facility] PT  Assessment - 09/30/19 0001      Strength   Right Shoulder Flexion  5/5                    [facility] Adult PT Treatment/Exercise - 09/30/19 0001      Shoulder Exercises: Prone   Retraction  15 reps;Both    Flexion  15 reps    Extension  10 reps   5s holds   Extension Limitations  retraction+ext      Shoulder Exercises: Standing   Flexion Limitations  liftoff from wall    ABduction Limitations  wall angels      Shoulder Exercises: ROM/Strengthening   UBE (Upper Arm Bike)  retro 3 min L1      Manual Therapy   Soft tissue mobilization  C6-T5 paraspinals               PT Short Term Goals - 09/21/19 1638      PT SHORT TERM GOAL #1   Title  Patient will be able to initiate HEP    Baseline  not every day    Status  Partially Met      PT SHORT TERM GOAL #2   Title  Patient will be able to increase thoracic flexion >/= 5 degrees in order to decrease pain when lifting and transferring objects    Status  Achieved      PT SHORT TERM GOAL #3   Title  Patient will be able to demonstrate appropriate sitting posture    Status  Achieved      PT SHORT TERM GOAL #4   Title  Patient will report that she is able to sit comfotably for >/= 10 mins in order to drive for work    Baseline  20-30 min    Status  Achieved        PT Long Term Goals - 09/21/19 1640      PT LONG TERM GOAL #1   Title  Patient will report worst pain as </= 3/10 to improve quality of life    Baseline  up to 8-9 about 2 weeks ago    Status  On-going    Target Date  10/06/19      PT LONG TERM GOAL #2   Title  Patient will achieve >/= 10 degrees of flexion and extension in order to decrease pain with functional activities    Baseline  WFL    Status  Achieved      PT LONG TERM GOAL #3   Title  Patient will report that she is able to type on her computer for school and work with no greater than a 4/10 pain    Baseline  still bothersome  to work ability    Status  On-going      PT [place] #4   Title  Patient will be able to sit for >/= 1 hour in order to drive long distances for work.    Baseline  20-30 minutes reported today    Status  On-going            Plan - 09/30/19 1710    Clinical Impression Statement  progressed HEP to add overhead activities. she did like the foam roll but would benefit from further practice prior to giving as HEP.    PT Treatment/Interventions  ADLs/Self Care Home Management;Cryotherapy;Electrical Stimulation;Iontophoresis 74m/ml Dexamethasone;Moist Heat;Traction;Ultrasound;Stair training;Gait training;Functional mobility training;Therapeutic activities;Therapeutic exercise;Manual techniques;Passive range of motion;Dry needling;Spinal Manipulations;Joint Manipulations;Patient/family education    PT Next Visit Plan  lifting, foam roll routine again    PT Home Exercise Plan  Upper Traps and levator stretch, Cat/Cow, CNeta Mends trigger point release. low trap set standing, wall angels    Consulted and Agree with Plan of Care  Patient       Patient will benefit from skilled therapeutic intervention in order to improve the following deficits and impairments:  Decreased activity tolerance, Decreased endurance, Decreased mobility, Decreased range of motion, Improper body mechanics, Postural dysfunction, Pain, Increased fascial restricitons, Difficulty walking  Visit Diagnosis: Pain in thoracic spine  Pain in right hand     Problem List Patient Active Problem List   Diagnosis Date Noted  . Fatigue 08/12/2019  . Back pain 07/30/2019  . Palpitation 03/11/2019  . Asthma due to seasonal allergies 08/18/2018  . Post-operative state 09/02/2017  . History of endometriosis 03/28/2017  . Hypertension 03/28/2017  . Abdominal pain in female 03/21/2017  . Vaginal discharge 03/07/2017  . Hematuria 02/01/2017  . Urgency of micturition 01/31/2017  . History of obstructive sleep apnea   . Diarrhea 12/15/2015  . History of laparoscopic  adjustable gastric banding   . [place] small bowel obstruction ([place] 11/17/2015  . Healthcare maintenance 06/27/2015  . Routine health maintenance 04/03/2015  . Perforated appendicitis s/p lap appy 07/28/2014 07/28/2014  . Hypokalemia 06/17/2013   Venkat Ankney C. Shyam Dawson PT, DPT 09/30/19 5:58 PM   [facility]GSkyline View NAlaska [address]Phone: [phone]  Fax:  [phone] Name: Meagan BALLINGERMRN: [id number] of Birth: 1July 02, 1972

## 2019-10-05 ENCOUNTER — Ambulatory Visit: Payer: No Typology Code available for payment source | Admitting: Physical Therapy

## 2019-10-05 ENCOUNTER — Other Ambulatory Visit: Payer: Self-pay

## 2019-10-05 ENCOUNTER — Encounter: Payer: Self-pay | Admitting: Physical Therapy

## 2019-10-05 DIAGNOSIS — M546 Pain in thoracic spine: Secondary | ICD-10-CM

## 2019-10-05 DIAGNOSIS — M79641 Pain in right hand: Secondary | ICD-10-CM

## 2019-10-05 NOTE — Therapy (Addendum)
[place], [place], [address] Phone: [phone]   Fax:  [phone]  Physical Therapy Treatment / Discharge  Patient Details  Name: Meagan Dean MRN: [id number] Date of Birth: 20-Dec-1970 Referring Provider (PT): Meagan Falcon, MD    Encounter Date: 10/05/2019  PT End of Session - 10/05/19 1638    Visit Number  9    Number of Visits  17    Date for PT Re-Evaluation  10/08/19    Authorization Type  Med pay    PT Start Time  1638   pt arrived 8 min late   PT Stop Time  1712    PT Time Calculation (min)  34 min    Activity Tolerance  Patient tolerated treatment well    Behavior During Therapy  Meagan Dean for tasks assessed/performed       Past Medical History:  Diagnosis Date  . Anemia   . ASCUS with positive high risk HPV cervical 03/15/2017  . Asthma due to seasonal allergies    MILD-- JUST GOT INHALER-not used yet -allergy induced  . Calculus of ureter 06/16/2013  . Endometriosis of pelvis   . Fibroids 09/02/2017  . GERD (gastroesophageal reflux disease)    diet controlled  . H/O hiatal hernia   . Headache    MIGRAINES - none since weight loss surgery  . History of endometriosis 03/28/2017  . History of hypertension    NO ISSUES SINCE WT LOSS AFTER GASTRIC BANDING  . History of kidney stones    surgery to remove  . History of obstructive sleep apnea    DX'D PRIOR TO GASTRIC BANDING  2010 --  NO ISSUES SINCE WT LOSS  . Hypertension 03/28/2017  . Low potassium syndrome    hopsitalized for 3 days  . Right ureteral stone   . Seasonal allergies   . Sinus headache   . Sleep apnea    prior to weight loss surgery, does not have cpap  . Urgency of urination     Past Surgical History:  Procedure Laterality Date  . ABDOMINAL HYSTERECTOMY    . APPENDECTOMY    . bowel obstruction  2017  . CYSTOSCOPY WITH RETROGRADE PYELOGRAM, URETEROSCOPY AND STENT PLACEMENT Right 06/18/2013   Procedure: CYSTOSCOPY  WITH RETROGRADE PYELOGRAM, URETEROSCOPY AND STENT PLACEMENT;  Surgeon: Meagan Gallo, MD;  Location: [facility];  Service: Urology;  Laterality: Right;  . CYSTOSCOPY WITH RETROGRADE PYELOGRAM, URETEROSCOPY AND STENT PLACEMENT Right 07/05/2013   Procedure: CYSTOSCOPY , URETEROSCOPY ANDstone extraction;  Surgeon: Meagan Gallo, MD;  Location: [facility];  Service: Urology;  Laterality: Right;  . EXPLORATORY LAPARTOMY/  MYOMECTOMY  2005  . HYSTERECTOMY ABDOMINAL WITH SALPINGECTOMY Bilateral 09/02/2017   Procedure: HYSTERECTOMY ABDOMINAL WITH SALPINGECTOMY;  Surgeon: Meagan Drafts, MD;  Location: [facility];  Service: Gynecology;  Laterality: Bilateral;  . LAPAROSCOPIC APPENDECTOMY N/A 07/28/2014   Procedure: APPENDECTOMY LAPAROSCOPIC;  Surgeon: Meagan Confer, MD;  Location: [facility];  Service: General;  Laterality: N/A;  . LAPAROSCOPIC GASTRIC BANDING  06-06-2008  . LAPAROSCOPY /  OVARIAN CYSTECTOMY/  LASER ABLATION ENDOMETRIOSIS  2003  . LAPAROTOMY N/A 11/24/2015   Procedure: EXPLORATORY LAPAROTOMY;  Surgeon: Meagan Skeans, MD;  Location: [facility];  Service: General;  Laterality: N/A;  . LYSIS OF ADHESION N/A 11/24/2015   Procedure: LYSIS OF ADHESION;  Surgeon: Meagan Skeans, MD;  Location: [facility];  Service: General;  Laterality: N/A;  . LYSIS OF ADHESION N/A 09/02/2017   Procedure: LYSIS OF  ADHESION;  Surgeon: Meagan Drafts, MD;  Location: [facility];  Service: Gynecology;  Laterality: N/A;  . wisdom teeth ext      There were no vitals filed for this visit.  Subjective Assessment - 10/05/19 1640    Subjective  "I was pretty worn out the last session but it got much better"    Patient Stated Goals  I want my back to feel better, decrease pain in back to get back to functional activities.    Currently in Pain?  Yes    Pain Score  0-No pain    Pain Location  Back    Pain Onset  More than a month ago    Pain Frequency  Intermittent    Aggravating Factors   driving for  long periods of time    Pain Relieving Factors  sleeping         [facility] PT Assessment - 10/05/19 0001      Assessment   Medical Diagnosis  Acute bilateral thoracic back pain     Referring Provider (PT)  Meagan Falcon, MD       Observation/Other Assessments   Focus on Therapeutic Outcomes (FOTO)   50% limited                    [facility] Adult PT Treatment/Exercise - 10/05/19 0001      Neck Exercises: Supine   Other Supine Exercise  foam roll rountine, ceiling punches, horizontal abd/ add, alternating ceiling punches, x to Y, and back stroke 1 x 15 ea.               PT Short Term Goals - 10/05/19 1647      PT SHORT TERM GOAL #1   Title  Patient will be able to initiate HEP    Period  Weeks    Status  Achieved      PT SHORT TERM GOAL #2   Title  Patient will be able to increase thoracic flexion >/= 5 degrees in order to decrease pain when lifting and transferring objects    Period  Weeks    Status  Achieved      PT SHORT TERM GOAL #3   Title  Patient will be able to demonstrate appropriate sitting posture    Period  Weeks    Status  Achieved      PT SHORT TERM GOAL #4   Title  Patient will report that she is able to sit comfotably for >/= 10 mins in order to drive for work    Period  Weeks    Status  Achieved        PT Long Term Goals - 10/05/19 1648      PT LONG TERM GOAL #1   Title  Patient will report worst pain as </= 3/10 to improve quality of life    Period  Weeks    Status  Achieved      PT LONG TERM GOAL #2   Title  Patient will achieve >/= 10 degrees of flexion and extension in order to decrease pain with functional activities    Period  Weeks    Status  Achieved      PT LONG TERM GOAL #3   Title  Patient will report that she is able to type on her computer for school and work with no greater than a 4/10 pain    Period  Weeks    Status  Achieved  PT LONG TERM GOAL #4   Title  Patient will be able to sit for >/= 1 hour in  order to drive long distances for work.    Period  Weeks    Status  Partially Met            Plan - 10/05/19 1725    Clinical Impression Statement  Meagan Dean has made significant improvement with physical therapy increasing trunk ROM, and additionaly reports min to no pain. she does not intermittent soreness/ stiffness with prolonged sitting, especially at a computer. She met or partially met all goals today. She responded well to exercise today reporting no issues or problems. she is able to maintain and progress her current level of function independently and will be discharged from PT today.    PT Treatment/Interventions  ADLs/Self Care Home Management;Cryotherapy;Electrical Stimulation;Iontophoresis 30m/ml Dexamethasone;Moist Heat;Traction;Ultrasound;Stair training;Gait training;Functional mobility training;Therapeutic activities;Therapeutic exercise;Manual techniques;Passive range of motion;Dry needling;Spinal Manipulations;Joint Manipulations;Patient/family education    PT Next Visit Plan  d/C    PT Home Exercise Plan  Upper Traps and levator stretch, Cat/Cow, C[employer] trigger point release. low trap set standing, wall angels    Consulted and Agree with Plan of Care  Patient       Patient will benefit from skilled therapeutic intervention in order to improve the following deficits and impairments:  Decreased activity tolerance, Decreased endurance, Decreased mobility, Decreased range of motion, Improper body mechanics, Postural dysfunction, Pain, Increased fascial restricitons, Difficulty walking  Visit Diagnosis: Pain in thoracic spine  Pain in right hand     Problem List Patient Active Problem List   Diagnosis Date Noted  . Fatigue 08/12/2019  . Back pain 07/30/2019  . Palpitation 03/11/2019  . Asthma due to seasonal allergies 08/18/2018  . Post-operative state 09/02/2017  . History of endometriosis 03/28/2017  . Hypertension 03/28/2017  . Abdominal pain in female  03/21/2017  . Vaginal discharge 03/07/2017  . Hematuria 02/01/2017  . Urgency of micturition 01/31/2017  . History of obstructive sleep apnea   . Diarrhea 12/15/2015  . History of laparoscopic adjustable gastric banding   . Partial small bowel obstruction ([place] 11/17/2015  . Healthcare maintenance 06/27/2015  . Routine health maintenance 04/03/2015  . Perforated appendicitis s/p lap appy 07/28/2014 07/28/2014  . Hypokalemia 06/17/2013    LStarr Lake5/25/2021, 5:30 PM  C[facility] Inc[address]GOliver NAl[place] [address]Phone: [phone]  Fax:  [phone] Name: Meagan GHATTASMRN: [id number] of Birth: [birth year]      PHYSICAL THERAPY DISCHARGE SUMMARY  Visits from Start of Care: 9  Current functional level related to goals / functional outcomes: See goals, FOTO 50% limited   Remaining deficits: See assessment note   Education / Equipment: HEP, theraband, posture. Lifting mechanics  Plan: Patient agrees to discharge.  Patient goals were met. Patient is being discharged due to meeting the stated rehab goals.  ?????         Kruz Chiu PT, DPT, LAT, ATC  10/05/19  5:30 PM

## 2019-10-07 ENCOUNTER — Ambulatory Visit: Payer: No Typology Code available for payment source | Admitting: Physical Therapy

## 2020-08-01 ENCOUNTER — Emergency Department (HOSPITAL_COMMUNITY)
Admission: EM | Admit: 2020-08-01 | Discharge: 2020-08-02 | Disposition: A | Discharge status: Home / Self Care | Payer: 59 | Attending: Emergency Medicine | Admitting: Emergency Medicine

## 2020-08-01 DIAGNOSIS — R112 Nausea with vomiting, unspecified: Secondary | ICD-10-CM | POA: Diagnosis not present

## 2020-08-01 DIAGNOSIS — R1084 Generalized abdominal pain: Secondary | ICD-10-CM | POA: Diagnosis not present

## 2020-08-01 DIAGNOSIS — K219 Gastro-esophageal reflux disease without esophagitis: Secondary | ICD-10-CM | POA: Diagnosis not present

## 2020-08-01 DIAGNOSIS — I10 Essential (primary) hypertension: Secondary | ICD-10-CM | POA: Diagnosis not present

## 2020-08-01 DIAGNOSIS — J45909 Unspecified asthma, uncomplicated: Secondary | ICD-10-CM | POA: Insufficient documentation

## 2020-08-01 DIAGNOSIS — E876 Hypokalemia: Secondary | ICD-10-CM

## 2020-08-01 LAB — COMPREHENSIVE METABOLIC PANEL
ALT: 12 U/L (ref 0–44)
AST: 25 U/L (ref 15–41)
Albumin: 3.9 g/dL (ref 3.5–5.0)
Alkaline Phosphatase: 69 U/L (ref 38–126)
Anion gap: 10 (ref 5–15)
BUN: 10 mg/dL (ref 6–20)
CO2: 21 mmol/L — ABNORMAL LOW (ref 22–32)
Calcium: 8.8 mg/dL — ABNORMAL LOW (ref 8.9–10.3)
Chloride: 101 mmol/L (ref 98–111)
Creatinine, Ser: 0.96 mg/dL (ref 0.44–1.00)
GFR, Estimated: >60 mL/min (ref >60)
Glucose, Bld: 112 mg/dL — ABNORMAL HIGH (ref 70–99)
Potassium: 2.7 mmol/L — CL (ref 3.5–5.1)
Sodium: 132 mmol/L — ABNORMAL LOW (ref 135–145)
Total Bilirubin: 0.7 mg/dL (ref 0.3–1.2)
Total Protein: 7 g/dL (ref 6.5–8.1)

## 2020-08-01 LAB — URINALYSIS, ROUTINE W REFLEX MICROSCOPIC
Bacteria, UA: NONE SEEN
Bilirubin Urine: NEGATIVE
Glucose, UA: NEGATIVE mg/dL
Hgb urine dipstick: NEGATIVE
Ketones, ur: 5 mg/dL — AB
Leukocytes,Ua: NEGATIVE
Nitrite: NEGATIVE
Protein, ur: 30 mg/dL — AB
Specific Gravity, Urine: 1.028 (ref 1.005–1.030)
pH: 5 (ref 5.0–8.0)

## 2020-08-01 LAB — CBC
HCT: 37.7 % (ref 36.0–46.0)
Hemoglobin: 12.3 g/dL (ref 12.0–15.0)
MCH: 28.4 pg (ref 26.0–34.0)
MCHC: 32.6 g/dL (ref 30.0–36.0)
MCV: 87.1 fL (ref 80.0–100.0)
Platelets: 368 10*3/uL (ref 150–400)
RBC: 4.33 MIL/uL (ref 3.87–5.11)
RDW: 13.2 % (ref 11.5–15.5)
WBC: 8.3 10*3/uL (ref 4.0–10.5)
nRBC: 0 % (ref 0.0–0.2)

## 2020-08-01 LAB — LIPASE, BLOOD: Lipase: 47 U/L (ref 11–51)

## 2020-08-01 MED ORDER — ONDANSETRON 4 MG PO TBDP
4.0000 mg | ORAL_TABLET | Freq: Once | ORAL | Status: AC | PRN
  Administered 2020-08-01: 4 mg via ORAL
  Filled 2020-08-01: qty 1

## 2020-08-01 NOTE — ED Triage Notes (Signed)
Pt reports have an hx of bowl obstruction and states about several months ago and states some issues with constipation. Pt states since yesterday felt like she was having stomach cramping/ spasm and started having diarrhea and N/V. Pt reports 4 episode of vomiting and still having abd cramping

## 2020-08-01 NOTE — ED Notes (Signed)
Critical POTASSIUM 2.7

## 2020-08-02 ENCOUNTER — Emergency Department (HOSPITAL_COMMUNITY): Payer: 59

## 2020-08-02 ENCOUNTER — Encounter (HOSPITAL_COMMUNITY): Payer: Self-pay | Admitting: Emergency Medicine

## 2020-08-02 MED ORDER — POTASSIUM CHLORIDE ER 20 MEQ PO TBCR: 20.0000 meq | EXTENDED_RELEASE_TABLET | Freq: Two times a day (BID) | ORAL | 0 refills | Status: DC

## 2020-08-02 MED ORDER — KETOROLAC TROMETHAMINE 60 MG/2ML IM SOLN
60.0000 mg | Freq: Once | INTRAMUSCULAR | Status: AC
  Administered 2020-08-02: 60 mg via INTRAMUSCULAR
  Filled 2020-08-02: qty 2

## 2020-08-02 MED ORDER — POTASSIUM CHLORIDE CRYS ER 20 MEQ PO TBCR
80.0000 meq | EXTENDED_RELEASE_TABLET | Freq: Once | ORAL | Status: AC
  Administered 2020-08-02: 80 meq via ORAL
  Filled 2020-08-02: qty 4

## 2020-08-02 MED ORDER — ONDANSETRON 8 MG PO TBDP: ORAL_TABLET | ORAL | 0 refills | Status: DC

## 2020-08-02 NOTE — ED Provider Notes (Signed)
[facility] EMERGENCY DEPARTMENT Provider Note   CSN: [id number] Arrival date & time: 08/01/20  1748     History Chief Complaint  Patient presents with  . Abdominal Pain  . Nausea  . Emesis    Meagan Dean is a 50 y.o. female.  The history is provided by the patient.  Abdominal Pain Pain location:  Generalized Pain quality comment:  Sore  Pain radiates to:  Does not radiate Pain severity:  Moderate Onset quality:  Gradual Duration: months  Timing:  Constant Progression:  Unchanged Chronicity:  Chronic Context: not suspicious food intake and not trauma   Relieved by:  Nothing Worsened by:  Nothing Ineffective treatments:  None tried Associated symptoms: nausea and vomiting   Associated symptoms: no anorexia, no belching, no chest pain, no chills, no constipation and no shortness of breath   Risk factors: no alcohol abuse   Emesis Associated symptoms: abdominal pain   Associated symptoms: no arthralgias and no chills        Past Medical History:  Diagnosis Date  . Anemia   . ASCUS with positive high risk HPV cervical 03/15/2017  . Asthma due to seasonal allergies    MILD-- JUST GOT INHALER-not used yet -allergy induced  . Calculus of ureter 06/16/2013  . Endometriosis of pelvis   . Fibroids 09/02/2017  . GERD (gastroesophageal reflux disease)    diet controlled  . H/O hiatal hernia   . Headache    MIGRAINES - none since weight loss surgery  . History of endometriosis 03/28/2017  . History of hypertension    NO ISSUES SINCE WT LOSS AFTER GASTRIC BANDING  . History of kidney stones    surgery to remove  . History of obstructive sleep apnea    DX'D PRIOR TO GASTRIC BANDING  2010 --  NO ISSUES SINCE WT LOSS  . Hypertension 03/28/2017  . Low potassium syndrome    hopsitalized for 3 days  . Right ureteral stone   . Seasonal allergies   . Sinus headache   . Sleep apnea    prior to weight loss surgery, does not have cpap  . Urgency of  urination     Patient Active Problem List   Diagnosis Date Noted  . Fatigue 08/12/2019  . Back pain 07/30/2019  . Palpitation 03/11/2019  . Asthma due to seasonal allergies 08/18/2018  . Post-operative state 09/02/2017  . History of endometriosis 03/28/2017  . Hypertension 03/28/2017  . Abdominal pain in female 03/21/2017  . Vaginal discharge 03/07/2017  . Hematuria 02/01/2017  . Urgency of micturition 01/31/2017  . History of obstructive sleep apnea   . Diarrhea 12/15/2015  . History of laparoscopic adjustable gastric banding   . Partial small bowel obstruction ([place]) 11/17/2015  . Healthcare maintenance 06/27/2015  . Routine health maintenance 04/03/2015  . Perforated appendicitis s/p lap appy 07/28/2014 07/28/2014  . Hypokalemia 06/17/2013    Past Surgical History:  Procedure Laterality Date  . ABDOMINAL HYSTERECTOMY    . APPENDECTOMY    . bowel obstruction  2017  . CYSTOSCOPY WITH RETROGRADE PYELOGRAM, URETEROSCOPY AND STENT PLACEMENT Right 06/18/2013   Procedure: CYSTOSCOPY WITH RETROGRADE PYELOGRAM, URETEROSCOPY AND STENT PLACEMENT;  Surgeon: Franchot Gallo, MD;  Location: [facility];  Service: Urology;  Laterality: Right;  . CYSTOSCOPY WITH RETROGRADE PYELOGRAM, URETEROSCOPY AND STENT PLACEMENT Right 07/05/2013   Procedure: CYSTOSCOPY , URETEROSCOPY ANDstone extraction;  Surgeon: Franchot Gallo, MD;  Location: [facility];  Service: Urology;  Laterality: Right;  .  EXPLORATORY LAPARTOMY/  MYOMECTOMY  2005  . HYSTERECTOMY ABDOMINAL WITH SALPINGECTOMY Bilateral 09/02/2017   Procedure: HYSTERECTOMY ABDOMINAL WITH SALPINGECTOMY;  Surgeon: Lavonia Drafts, MD;  Location: [facility];  Service: Gynecology;  Laterality: Bilateral;  . LAPAROSCOPIC APPENDECTOMY N/A 07/28/2014   Procedure: APPENDECTOMY LAPAROSCOPIC;  Surgeon: Jackolyn Confer, MD;  Location: [facility];  Service: General;  Laterality: N/A;  . LAPAROSCOPIC GASTRIC BANDING  06-06-2008  . LAPAROSCOPY /   OVARIAN CYSTECTOMY/  LASER ABLATION ENDOMETRIOSIS  2003  . LAPAROTOMY N/A 11/24/2015   Procedure: EXPLORATORY LAPAROTOMY;  Surgeon: Georganna Skeans, MD;  Location: [facility];  Service: General;  Laterality: N/A;  . LYSIS OF ADHESION N/A 11/24/2015   Procedure: LYSIS OF ADHESION;  Surgeon: Georganna Skeans, MD;  Location: [facility];  Service: General;  Laterality: N/A;  . LYSIS OF ADHESION N/A 09/02/2017   Procedure: LYSIS OF ADHESION;  Surgeon: Lavonia Drafts, MD;  Location: [facility];  Service: Gynecology;  Laterality: N/A;  . wisdom teeth ext       OB History    Gravida  1   Para      Term      Preterm      AB  1   Living        SAB  1   IAB      Ectopic      Multiple      Live Births              Family History  Problem Relation Age of Onset  . Cancer Maternal Grandmother        colon/liver  . Stroke Father     Social History   Tobacco Use  . Smoking status: Never Smoker  . Smokeless tobacco: Never Used  Vaping Use  . Vaping Use: Never used  Substance Use Topics  . Alcohol use: Yes    Alcohol/week: 0.0 standard drinks    Comment: occasionally  . Drug use: No    Home Medications Prior to Admission medications   Medication Sig Start Date End Date Taking? Authorizing Provider  cetirizine (ZYRTEC) 10 MG tablet Take 10 mg by mouth daily as needed for allergies or rhinitis.     [provider]  fluticasone furoate-vilanterol (BREO ELLIPTA) 100-25 MCG/INH AEPB Inhale 1 puff into the lungs daily. Patient taking differently: Inhale 1 puff into the lungs daily as needed (wheezing).  08/18/18   Katherine Roan, MD  gabapentin (NEURONTIN) 300 MG capsule Take 1 capsule (300 mg total) by mouth at bedtime. Patient taking differently: Take 300 mg by mouth at bedtime as needed (pain).  07/29/19   Jeanmarie Hubert, MD  meloxicam (MOBIC) 15 MG tablet Take 15 mg by mouth daily as needed for pain.    [provider]  ondansetron (ZOFRAN) 4 MG tablet Take 1  tablet (4 mg total) by mouth every 8 (eight) hours as needed for nausea or vomiting. 08/16/19   Hayden Rasmussen, MD  potassium chloride (KLOR-CON) 10 MEQ tablet Take 1 tablet (10 mEq total) by mouth daily. 08/16/19   Hayden Rasmussen, MD  pseudoephedrine (SUDAFED) 120 MG 12 hr tablet Take 120 mg by mouth every 12 (twelve) hours as needed for congestion.    [provider]  spironolactone (ALDACTONE) 25 MG tablet Take 0.5 tablets (12.5 mg total) by mouth daily. 03/11/19 06/09/19  Marcelyn Bruins, MD  traMADol (ULTRAM) 50 MG tablet Take 1 tablet (50 mg total) by mouth every 6 (six) hours as needed for  severe pain. 03/10/19   Lavonia Drafts, MD    Allergies    Other and Vicodin [hydrocodone-acetaminophen]  Review of Systems   Review of Systems  Constitutional: Negative for chills.  HENT: Negative for congestion.   Eyes: Negative for visual disturbance.  Respiratory: Negative for shortness of breath.   Cardiovascular: Negative for chest pain.  Gastrointestinal: Positive for abdominal pain, nausea and vomiting. Negative for anorexia and constipation.  Genitourinary: Negative for difficulty urinating.  Musculoskeletal: Negative for arthralgias.  Skin: Negative for color change.  Neurological: Negative for dizziness.  Psychiatric/Behavioral: Negative for agitation.  All other systems reviewed and are negative.   Physical Exam Updated Vital Signs BP (!) 126/97 (BP Location: Left Arm)   Pulse 72   Temp (!) 97.5 F (36.4 C) (Oral)   Resp 20   LMP  (LMP Unknown) Comment: 07/2016  SpO2 99%   Physical Exam Vitals and nursing note reviewed.  Constitutional:      General: She is not in acute distress.    Appearance: Normal appearance.  HENT:     Head: Normocephalic and atraumatic.  Eyes:     Conjunctiva/sclera: Conjunctivae normal.     Pupils: Pupils are equal, round, and reactive to light.  Cardiovascular:     Rate and Rhythm: Normal rate and regular rhythm.      Pulses: Normal pulses.     Heart sounds: Normal heart sounds.  Pulmonary:     Effort: Pulmonary effort is normal.     Breath sounds: Normal breath sounds.  Abdominal:     General: Abdomen is flat. Bowel sounds are normal.     Palpations: Abdomen is soft.     Tenderness: There is no abdominal tenderness. There is no guarding or rebound.  Musculoskeletal:        General: Normal range of motion.     Cervical back: Normal range of motion and neck supple.  Skin:    General: Skin is warm and dry.     Capillary Refill: Capillary refill takes less than 2 seconds.  Neurological:     General: No focal deficit present.     Mental Status: She is alert and oriented to person, place, and time.     Deep Tendon Reflexes: Reflexes normal.  Psychiatric:        Mood and Affect: Mood normal.        Behavior: Behavior normal.     ED Results / Procedures / Treatments   Labs (all labs ordered are listed, but only abnormal results are displayed) Results for orders placed or performed during the hospital encounter of 08/01/20  Lipase, blood  Result Value Ref Range   Lipase 47 11 - 51 U/L  Comprehensive metabolic panel  Result Value Ref Range   Sodium 132 (L) 135 - 145 mmol/L   Potassium 2.7 (LL) 3.5 - 5.1 mmol/L   Chloride 101 98 - 111 mmol/L   CO2 21 (L) 22 - 32 mmol/L   Glucose, Bld 112 (H) 70 - 99 mg/dL   BUN 10 6 - 20 mg/dL   Creatinine, Ser 0.96 0.44 - 1.00 mg/dL   Calcium 8.8 (L) 8.9 - 10.3 mg/dL   Total Protein 7.0 6.5 - 8.1 g/dL   Albumin 3.9 3.5 - 5.0 g/dL   AST 25 15 - 41 U/L   ALT 12 0 - 44 U/L   Alkaline Phosphatase 69 38 - 126 U/L   Total Bilirubin 0.7 0.3 - 1.2 mg/dL   GFR, Estimated >60 >60  mL/min   Anion gap 10 5 - 15  CBC  Result Value Ref Range   WBC 8.3 4.0 - 10.5 K/uL   RBC 4.33 3.87 - 5.11 MIL/uL   Hemoglobin 12.3 12.0 - 15.0 g/dL   HCT 37.7 36.0 - 46.0 %   MCV 87.1 80.0 - 100.0 fL   MCH 28.4 26.0 - 34.0 pg   MCHC 32.6 30.0 - 36.0 g/dL   RDW 13.2 11.5 - 15.5 %    Platelets 368 150 - 400 K/uL   nRBC 0.0 0.0 - 0.2 %  Urinalysis, Routine w reflex microscopic Urine, Clean Catch  Result Value Ref Range   Color, Urine AMBER (A) YELLOW   APPearance HAZY (A) CLEAR   Specific Gravity, Urine 1.028 1.005 - 1.030   pH 5.0 5.0 - 8.0   Glucose, UA NEGATIVE NEGATIVE mg/dL   Hgb urine dipstick NEGATIVE NEGATIVE   Bilirubin Urine NEGATIVE NEGATIVE   Ketones, ur 5 (A) NEGATIVE mg/dL   Protein, ur 30 (A) NEGATIVE mg/dL   Nitrite NEGATIVE NEGATIVE   Leukocytes,Ua NEGATIVE NEGATIVE   RBC / HPF 0-5 0 - 5 RBC/hpf   WBC, UA 0-5 0 - 5 WBC/hpf   Bacteria, UA NONE SEEN NONE SEEN   Squamous Epithelial / LPF 0-5 0 - 5   Mucus PRESENT    Ca Oxalate Crys, UA PRESENT    CT Renal Stone Study  Result Date: 08/02/2020 CLINICAL DATA:  50 year old female with flank pain. Concern for kidney stone. EXAM: CT ABDOMEN AND PELVIS WITHOUT CONTRAST TECHNIQUE: Multidetector CT imaging of the abdomen and pelvis was performed following the standard protocol without IV contrast. COMPARISON:  CT abdomen pelvis dated 08/16/2019. FINDINGS: Evaluation of this exam is limited in the absence of intravenous contrast. Lower chest: The visualized lung bases are clear. No intra-abdominal free air or free fluid. Hepatobiliary: No focal liver abnormality is seen. No gallstones, gallbladder wall thickening, or biliary dilatation. Pancreas: Unremarkable. No pancreatic ductal dilatation or surrounding inflammatory changes. Spleen: Normal in size without focal abnormality. Adrenals/Urinary Tract: The adrenal glands unremarkable. There is no hydronephrosis or nephrolithiasis on either side. The visualized ureters and urinary bladder appear unremarkable. Stomach/Bowel: There is a gastric lap band. There is mild dilatation of the distal esophagus with fluid content which may represent reflux or delayed clearance. The degree of esophageal dilatation has decreased compared to prior CT. There is moderate stool  throughout the colon. No bowel obstruction or active inflammation. Appendectomy. Vascular/Lymphatic: The abdominal aorta and IVC unremarkable. No portal venous gas. There is no adenopathy. Reproductive: Hysterectomy.  No adnexal masses. Other: There is in short segment herniation of small bowel loop at the level of the umbilicus. Musculoskeletal: No acute osseous pathology. IMPRESSION: 1. No acute intra-abdominal or pelvic pathology. No hydronephrosis or nephrolithiasis. 2. Gastric lap. No bowel obstruction. Electronically Signed   By: Anner Crete M.D.   On: 08/02/2020 03:10    Radiology CT Renal Stone Study  Result Date: 08/02/2020 CLINICAL DATA:  50 year old female with flank pain. Concern for kidney stone. EXAM: CT ABDOMEN AND PELVIS WITHOUT CONTRAST TECHNIQUE: Multidetector CT imaging of the abdomen and pelvis was performed following the standard protocol without IV contrast. COMPARISON:  CT abdomen pelvis dated 08/16/2019. FINDINGS: Evaluation of this exam is limited in the absence of intravenous contrast. Lower chest: The visualized lung bases are clear. No intra-abdominal free air or free fluid. Hepatobiliary: No focal liver abnormality is seen. No gallstones, gallbladder wall thickening, or biliary dilatation. Pancreas: Unremarkable.  No pancreatic ductal dilatation or surrounding inflammatory changes. Spleen: Normal in size without focal abnormality. Adrenals/Urinary Tract: The adrenal glands unremarkable. There is no hydronephrosis or nephrolithiasis on either side. The visualized ureters and urinary bladder appear unremarkable. Stomach/Bowel: There is a gastric lap band. There is mild dilatation of the distal esophagus with fluid content which may represent reflux or delayed clearance. The degree of esophageal dilatation has decreased compared to prior CT. There is moderate stool throughout the colon. No bowel obstruction or active inflammation. Appendectomy. Vascular/Lymphatic: The abdominal  aorta and IVC unremarkable. No portal venous gas. There is no adenopathy. Reproductive: Hysterectomy.  No adnexal masses. Other: There is in short segment herniation of small bowel loop at the level of the umbilicus. Musculoskeletal: No acute osseous pathology. IMPRESSION: 1. No acute intra-abdominal or pelvic pathology. No hydronephrosis or nephrolithiasis. 2. Gastric lap. No bowel obstruction. Electronically Signed   By: Anner Crete M.D.   On: 08/02/2020 03:10    Procedures Procedures   Medications Ordered in ED Medications  ondansetron (ZOFRAN-ODT) disintegrating tablet 4 mg (4 mg Oral Given 08/01/20 1808)  ketorolac (TORADOL) injection 60 mg (60 mg Intramuscular Given 08/02/20 0308)  potassium chloride SA (KLOR-CON) CR tablet 80 mEq (80 mEq Oral Given 08/02/20 0308)    ED Course  I have reviewed the triage vital signs and the nursing notes.  Pertinent labs & imaging results that were available during my care of the patient were reviewed by me and considered in my medical decision making (see chart for details).    Meagan Dean was evaluated in Emergency Department on 08/02/2020 for the symptoms described in the history of present illness. She was evaluated in the context of the global COVID-19 pandemic, which necessitated consideration that the patient might be at risk for infection with the SARS-CoV-2 virus that causes COVID-19. Institutional protocols and algorithms that pertain to the evaluation of patients at risk for COVID-19 are in a state of rapid change based on information released by regulatory bodies including the CDC and federal and state organizations. These policies and algorithms were followed during the patient's care in the ED.  Final Clinical Impression(s) / ED Diagnoses Return for intractable cough, coughing up blood, fevers >100.4 unrelieved by medication, shortness of breath, intractable vomiting, chest pain, shortness of breath, weakness, numbness, changes in  speech, facial asymmetry, abdominal pain, passing out, Inability to tolerate liquids or food, cough, altered mental status or any concerns. No signs of systemic illness or infection. The patient is nontoxic-appearing on exam and vital signs are within normal limits.  I have reviewed the triage vital signs and the nursing notes. Pertinent labs & imaging results that were available during my care of the patient were reviewed by me and considered in my medical decision making (see chart for details). After history, exam, and medical workup I feel the patient has been appropriately medically screened and is safe for discharge home. Pertinent diagnoses were discussed with the patient. Patient was given return precautions.      Palumbo, April, MD 08/02/20 [phone]

## 2020-08-02 NOTE — ED Notes (Signed)
Patient verbalized understanding of discharge instructions. Opportunity for questions and answers.  

## 2020-10-25 ENCOUNTER — Ambulatory Visit (INDEPENDENT_AMBULATORY_CARE_PROVIDER_SITE_OTHER): Payer: 59 | Admitting: *Deleted

## 2020-10-25 DIAGNOSIS — Z111 Encounter for screening for respiratory tuberculosis: Secondary | ICD-10-CM | POA: Diagnosis not present

## 2020-10-27 LAB — TB SKIN TEST
Induration: 0 mm
TB Skin Test: NEGATIVE

## 2020-11-14 ENCOUNTER — Encounter: Payer: Self-pay | Admitting: *Deleted

## 2021-04-24 ENCOUNTER — Encounter: Payer: 59 | Admitting: Internal Medicine

## 2021-04-30 ENCOUNTER — Encounter: Payer: Self-pay | Admitting: Student

## 2021-05-22 ENCOUNTER — Ambulatory Visit (HOSPITAL_COMMUNITY)
Admission: RE | Admit: 2021-05-22 | Discharge: 2021-05-22 | Disposition: A | Discharge status: Home / Self Care | Payer: Self-pay | Source: Ambulatory Visit | Attending: Internal Medicine | Admitting: Internal Medicine

## 2021-05-22 ENCOUNTER — Ambulatory Visit: Payer: Self-pay | Admitting: Internal Medicine

## 2021-05-22 ENCOUNTER — Other Ambulatory Visit: Payer: Self-pay

## 2021-05-22 ENCOUNTER — Encounter: Payer: Self-pay | Admitting: Internal Medicine

## 2021-05-22 VITALS — BP 137/76 | HR 81 | Temp 98.3°F | Ht 63.0 in | Wt 163.6 lb

## 2021-05-22 DIAGNOSIS — R3589 Other polyuria: Secondary | ICD-10-CM | POA: Insufficient documentation

## 2021-05-22 DIAGNOSIS — Z0000 Encounter for general adult medical examination without abnormal findings: Secondary | ICD-10-CM

## 2021-05-22 DIAGNOSIS — R631 Polydipsia: Secondary | ICD-10-CM

## 2021-05-22 DIAGNOSIS — E876 Hypokalemia: Secondary | ICD-10-CM | POA: Insufficient documentation

## 2021-05-22 DIAGNOSIS — I10 Essential (primary) hypertension: Secondary | ICD-10-CM

## 2021-05-22 DIAGNOSIS — R5383 Other fatigue: Secondary | ICD-10-CM

## 2021-05-22 LAB — BASIC METABOLIC PANEL
Anion gap: 10 (ref 5–15)
BUN: 11 mg/dL (ref 6–20)
CO2: 27 mmol/L (ref 22–32)
Calcium: 9.6 mg/dL (ref 8.9–10.3)
Chloride: 102 mmol/L (ref 98–111)
Creatinine, Ser: 0.75 mg/dL (ref 0.44–1.00)
GFR, Estimated: >60 mL/min (ref >60)
Glucose, Bld: 89 mg/dL (ref 70–99)
Potassium: 3.5 mmol/L (ref 3.5–5.1)
Sodium: 139 mmol/L (ref 135–145)

## 2021-05-22 LAB — MAGNESIUM: Magnesium: 2 mg/dL (ref 1.7–2.4)

## 2021-05-22 MED ORDER — SPIRONOLACTONE 25 MG PO TABS
12.5000 mg | ORAL_TABLET | Freq: Every day | ORAL | 1 refills | Status: DC
  Filled 2021-05-22: qty 15, 30d supply, fill #0
  Filled 2021-06-06: qty 45, 90d supply, fill #0

## 2021-05-22 NOTE — Progress Notes (Addendum)
°  Subjective:     Patient ID: Meagan Dean, female   DOB: [birth year], 51 y.o.   MRN: [id number]  Meagan Dean is a 51y/o who presents to clinic for fatigue, muscle cramping, and some chest discomfort that she experienced last week. She says recently she has been very fatigued and this has affected her work. She requires 5-hour energy drinks and caffeine to get through the work day. Additionally she has been experiencing cramping in her hands and toes and last week felt a "funny, tingly" feeling in her chest. She reports sometimes feeling numbness in her hands, R>L.As she has a history of hypokalemia, she was concerned that this chest discomfort may have been due to potassium levels, and she ran out and bought some over-the-counter potassium supplements. She has had some difficulties with insurance after changing jobs and has not been taking the previously prescribed spironolactone or K-Cl supplements.    Review of Systems  Constitutional:  Positive for fatigue. Negative for fever.  Cardiovascular:        "Tingly, funny feeling in chest last week."  Gastrointestinal:  Positive for nausea. Negative for constipation and diarrhea.  Endocrine: Positive for polydipsia and polyuria.  Musculoskeletal:  Positive for myalgias.      Objective:   Physical Exam Constitutional:      Appearance: Normal appearance.  HENT:     Head: Normocephalic and atraumatic.  Cardiovascular:     Rate and Rhythm: Normal rate and regular rhythm.     Heart sounds: Normal heart sounds.  Pulmonary:     Effort: Pulmonary effort is normal. No respiratory distress.     Breath sounds: Normal breath sounds. No wheezing.  Abdominal:     General: Abdomen is flat. Bowel sounds are normal. There is no distension.     Palpations: Abdomen is soft.     Tenderness: There is no abdominal tenderness.  Musculoskeletal:     Right lower leg: No edema.     Left lower leg: No edema.  Neurological:     Mental Status: She is alert.        Assessment & Plan:     See problem-based charting.       Attestation for Student Documentation:  I personally was present and performed or re-performed the history, physical exam and medical decision-making activities of this service and have verified that the service and findings are accurately documented in the students note.  Rehman, Areeg N, DO 05/23/2021, 10:15 AM  Areeg Laural Golden, DO PGY-3 IMTS

## 2021-05-22 NOTE — Patient Instructions (Signed)
Thank you, Ms.[name] for allowing [place] to provide your care today. Today we discussed:  Fatigue: - Your fatigue may be due to low potassium, and we will check this level today and call you with the results.  Cramping: - Your cramping may also be caused by low potassium levels.    Frequent urination: - We will check a blood sugar level given your frequent urination and increased thirst.  Health maintenance: - We have provided a pamphlet for the breast center. Give them a call to schedule an appointment for a free mammogram. - You are due some vaccines, including TDAP and pneumococcal. We will make a note to get these done at your next appointment in 2 months.  I have ordered the following labs for you:  Lab Orders         BMP8+Anion Gap         Hemoglobin A1c         Magnesium      Tests ordered today:  EKG  I have ordered the following medication/changed the following medications:   Stop the following medications: Medications Discontinued During This Encounter  Medication Reason   spironolactone (ALDACTONE) 25 MG tablet Reorder     Start the following medications: Meds ordered this encounter  Medications   spironolactone (ALDACTONE) 25 MG tablet    Sig: Take 0.5 tablets (12.5 mg total) by mouth daily.    Dispense:  45 tablet    Refill:  1     Follow up: 2 months   Remember:  Should you have any questions or concerns please call the internal medicine clinic at [phone].  If you feel chest pain, shortness of breath, or feel like you may pass out, call 911 or go to the nearest emergency room.

## 2021-05-22 NOTE — Assessment & Plan Note (Addendum)
BP 137/76 today and she has not been taking her spironolactone. We will check her potassium level today and consider re-starting this medication depending on level.   ADDENDUM: Potassium 3.5.  [facility]

## 2021-05-22 NOTE — Assessment & Plan Note (Addendum)
History of fatigue that has made her work more difficult. She initially thought her fatigue may be attributable to prior COVID infection but it has persisted. Given additional symptoms of cramping, weakness, this may also be due to hypokalemia as she has not had access to medications. Will check a BMP and magnesium level today.  - BMP - Magnesium

## 2021-05-22 NOTE — Assessment & Plan Note (Addendum)
She reports a recent history of fatigue, cramping in her hands and feet, and numbness in her hands. Additionally, she felt a "funny, tingling" feeling in her chest last week that prompted her to purchase OTC potassium supplements, given her history of hypokalemia. As she is waiting on insurance coverage from a new job, she has not been taking spironolactone or KCl supplements and has only been taking Zofran prn. Hypokalemia would explain her complaints of fatigue, cramping, and numbness. With recent report of chest discomfort, there is concern for cardiac abnormalities, and will obtain EKG today as well. - BMP stat - Magnesium level - EKG  ADDENDUM Potassium 3.5, mag 2 and EKG reassuring Continue spironolactone

## 2021-05-22 NOTE — Assessment & Plan Note (Addendum)
Patient reports recent increased urination and increased thirst. No prior history of diabetes. Will plan to check an A1c level today.  - Hemoglobin A1c   ADDENDUM: Hemoglobin A1c within normal limits

## 2021-05-22 NOTE — Assessment & Plan Note (Addendum)
She is due for: - Mammogram - Colon cancer screening - Tdap - PCV20 vaccination (has gotten PPSV23 in the past)  Provided her with resources to call [facility] to get her mammogram. She has prior hx of total hysterectomy and ASCUS with high-risk HPV, but unsure if her OBGYN recommended Pap smears in the future. Counseled her to call their office and check with them.  She expects insurance coverage in February and can get Tdap and PCV20 as well as stool FIT/colonoscopy at her next appointment.

## 2021-05-23 LAB — HEMOGLOBIN A1C
Est. average glucose Bld gHb Est-mCnc: 105 mg/dL
Hgb A1c MFr Bld: 5.3 % (ref 4.8–5.6)

## 2021-05-23 NOTE — Progress Notes (Signed)
Patient is a 51 year old female presenting today for muscle cramping, fatigue and chest discomfort for the past few week.  She has not been taking her spironolactone or potassium supplements due to lack of insurance she is unable to afford this at this time.  Suspect that most of her symptoms are in the setting.  She is in the process of obtaining new insurance through her job she does started.  States that she should have any insurance by early February.  EKG was done while she was here in clinic which was reassuring.  BMP showed a potassium of 3.5.  Re[facility] as they may be more affordable there.  We will have patient follow-up in mid February to further assess her hypokalemia and see how her symptoms of fatigue and muscle cramping are.  Harlow Ohms, DO PGY-3 IMTS

## 2021-05-24 NOTE — Progress Notes (Signed)
Internal Medicine Clinic Attending  Case discussed with Dr. Laural Golden  At the time of the visit.  We reviewed the residents history and exam and pertinent patient test results.  I agree with the assessment, diagnosis, and plan of care documented in the residents note. Chronic hypokalemia of undetermined etiology per chart review. Evaluation for primary hyperaldosteronism in 2018 was negative. Will plan for further evaluation at next visit, consider urinary potassium and consider acid-base, polyuria, among others.  Hysterectomy in 2019 for fibroids with surgical pathology notable for removal of the cervix, unremarkable. 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 Follow-up colposcopy  in 05/2017 with benign squamous mucosa on cervical biopsy, and atypica suggestive of HPV cytopathic effect without evidence of HSIL on endocervical fragments. Given no history of high-grade findings and hysterectomy for non-malignant/high grade findings, PAP screening can be discontinued.

## 2021-05-28 ENCOUNTER — Other Ambulatory Visit: Payer: Self-pay

## 2021-06-06 ENCOUNTER — Other Ambulatory Visit: Payer: Self-pay

## 2021-06-22 ENCOUNTER — Emergency Department (HOSPITAL_BASED_OUTPATIENT_CLINIC_OR_DEPARTMENT_OTHER)
Admission: EM | Admit: 2021-06-22 | Discharge: 2021-06-22 | Disposition: A | Discharge status: Home / Self Care | Payer: Commercial Managed Care - PPO | Attending: Emergency Medicine | Admitting: Emergency Medicine

## 2021-06-22 ENCOUNTER — Other Ambulatory Visit: Payer: Self-pay

## 2021-06-22 ENCOUNTER — Ambulatory Visit (INDEPENDENT_AMBULATORY_CARE_PROVIDER_SITE_OTHER): Payer: Self-pay | Admitting: Internal Medicine

## 2021-06-22 ENCOUNTER — Encounter (HOSPITAL_BASED_OUTPATIENT_CLINIC_OR_DEPARTMENT_OTHER): Payer: Self-pay | Admitting: Emergency Medicine

## 2021-06-22 ENCOUNTER — Telehealth: Payer: Self-pay | Admitting: Student

## 2021-06-22 ENCOUNTER — Other Ambulatory Visit (HOSPITAL_COMMUNITY)
Admission: RE | Admit: 2021-06-22 | Discharge: 2021-06-22 | Disposition: A | Discharge status: Home / Self Care | Payer: Self-pay | Source: Other Acute Inpatient Hospital

## 2021-06-22 ENCOUNTER — Telehealth: Payer: Self-pay | Admitting: *Deleted

## 2021-06-22 DIAGNOSIS — Z20822 Contact with and (suspected) exposure to covid-19: Secondary | ICD-10-CM | POA: Insufficient documentation

## 2021-06-22 DIAGNOSIS — E876 Hypokalemia: Secondary | ICD-10-CM | POA: Diagnosis present

## 2021-06-22 DIAGNOSIS — R252 Cramp and spasm: Secondary | ICD-10-CM | POA: Insufficient documentation

## 2021-06-22 LAB — CBC WITH DIFFERENTIAL/PLATELET
Abs Immature Granulocytes: 0.01 10*3/uL (ref 0.00–0.07)
Basophils Absolute: 0 10*3/uL (ref 0.0–0.1)
Basophils Relative: 0 %
Eosinophils Absolute: 0.1 10*3/uL (ref 0.0–0.5)
Eosinophils Relative: 1 %
HCT: 36.4 % (ref 36.0–46.0)
Hemoglobin: 11.9 g/dL — ABNORMAL LOW (ref 12.0–15.0)
Immature Granulocytes: 0 %
Lymphocytes Relative: 41 %
Lymphs Abs: 3.1 10*3/uL (ref 0.7–4.0)
MCH: 27.7 pg (ref 26.0–34.0)
MCHC: 32.7 g/dL (ref 30.0–36.0)
MCV: 84.7 fL (ref 80.0–100.0)
Monocytes Absolute: 0.4 10*3/uL (ref 0.1–1.0)
Monocytes Relative: 6 %
Neutro Abs: 3.9 10*3/uL (ref 1.7–7.7)
Neutrophils Relative %: 52 %
Platelets: 326 10*3/uL (ref 150–400)
RBC: 4.3 MIL/uL (ref 3.87–5.11)
RDW: 13.3 % (ref 11.5–15.5)
WBC: 7.5 10*3/uL (ref 4.0–10.5)
nRBC: 0 % (ref 0.0–0.2)

## 2021-06-22 LAB — BASIC METABOLIC PANEL
Anion gap: 10 (ref 5–15)
Anion gap: 10 (ref 5–15)
BUN: 14 mg/dL (ref 6–20)
BUN: 15 mg/dL (ref 6–20)
CO2: 25 mmol/L (ref 22–32)
CO2: 26 mmol/L (ref 22–32)
Calcium: 9.4 mg/dL (ref 8.9–10.3)
Calcium: 9.3 mg/dL (ref 8.9–10.3)
Chloride: 104 mmol/L (ref 98–111)
Chloride: 102 mmol/L (ref 98–111)
Creatinine, Ser: 0.96 mg/dL (ref 0.44–1.00)
Creatinine, Ser: 0.84 mg/dL (ref 0.44–1.00)
GFR, Estimated: >60 mL/min (ref >60)
GFR, Estimated: >60 mL/min (ref >60)
Glucose, Bld: 93 mg/dL (ref 70–99)
Glucose, Bld: 93 mg/dL (ref 70–99)
Potassium: 2.6 mmol/L — CL (ref 3.5–5.1)
Potassium: 2.7 mmol/L — CL (ref 3.5–5.1)
Sodium: 139 mmol/L (ref 135–145)
Sodium: 138 mmol/L (ref 135–145)

## 2021-06-22 LAB — RESP PANEL BY RT-PCR (FLU A&B, COVID) ARPGX2
Influenza A by PCR: NEGATIVE
Influenza B by PCR: NEGATIVE
SARS Coronavirus 2 by RT PCR: NEGATIVE

## 2021-06-22 LAB — MAGNESIUM: Magnesium: 2 mg/dL (ref 1.7–2.4)

## 2021-06-22 LAB — BRAIN NATRIURETIC PEPTIDE: B Natriuretic Peptide: 12.5 pg/mL (ref 0.0–100.0)

## 2021-06-22 LAB — POTASSIUM: Potassium: 3.4 mmol/L — ABNORMAL LOW (ref 3.5–5.1)

## 2021-06-22 MED ORDER — POTASSIUM CHLORIDE CRYS ER 20 MEQ PO TBCR
40.0000 meq | EXTENDED_RELEASE_TABLET | Freq: Once | ORAL | Status: AC
  Administered 2021-06-22: 40 meq via ORAL
  Filled 2021-06-22: qty 2

## 2021-06-22 MED ORDER — POTASSIUM CHLORIDE 10 MEQ/100ML IV SOLN
10.0000 meq | INTRAVENOUS | Status: DC
  Administered 2021-06-22 (×3): 10 meq via INTRAVENOUS
  Filled 2021-06-22 (×3): qty 100

## 2021-06-22 MED ORDER — POTASSIUM CHLORIDE CRYS ER 20 MEQ PO TBCR
40.0000 meq | EXTENDED_RELEASE_TABLET | Freq: Once | ORAL | Status: AC
Start: 2021-06-22 — End: 2021-06-22
  Administered 2021-06-22: 40 meq via ORAL
  Filled 2021-06-22: qty 2

## 2021-06-22 NOTE — Telephone Encounter (Signed)
Received a page from Meagan Dean with [facility] lab stating that there was a critical value for patient.  He noted the patient had a potassium of 2.6. History of lap band.   Patient recently had telehealth visit with Dr. Marva Dean for hand cramps.  She has had these in the past and was found to be hypokalemic.   BMP was drawn this clinic visit and patient's potassium was noted to be 2.6.  She states she continues to have cramps and also some palpitations.  Discussed with patient that she should present to the emergency room for IV potassium given her symptoms.  She stated she understood the urgency of this matter and would present to the [facility] ED.   Patient denies any diarrhea.  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 She may be having urinary potassium losses though she has had normal kidney function on her most recent labs.    -Will set up a telehealth visit next week to see how patient is doing.

## 2021-06-22 NOTE — ED Notes (Signed)
Pt verbalizes understanding of discharge instructions. Opportunity for questioning and answers were provided. Pt discharged from ED to home.   ? ?

## 2021-06-22 NOTE — Progress Notes (Signed)
°  [facility] Internal Medicine Residency Telephone Encounter Continuity Care Appointment  HPI:  This telephone encounter was created for [name] on 06/22/2021 for the following purpose/cc cramping.   Past Medical History:  Past Medical History:  Diagnosis Date   Anemia    ASCUS with positive high risk HPV cervical 03/15/2017   Asthma due to seasonal allergies    MILD-- JUST GOT INHALER-not used yet -allergy induced   Calculus of ureter 06/16/2013   Endometriosis of pelvis    Fibroids 09/02/2017   GERD (gastroesophageal reflux disease)    diet controlled   H/O hiatal hernia    Headache    MIGRAINES - none since weight loss surgery   History of endometriosis 03/28/2017   History of hypertension    NO ISSUES SINCE WT LOSS AFTER GASTRIC BANDING   History of kidney stones    surgery to remove   History of obstructive sleep apnea    DX'D PRIOR TO GASTRIC BANDING  2010 --  NO ISSUES SINCE WT LOSS   Hypertension 03/28/2017   Low potassium syndrome    hopsitalized for 3 days   Right ureteral stone    Seasonal allergies    Sinus headache    Sleep apnea    prior to weight loss surgery, does not have cpap   Urgency of urination      ROS:  Negative except as stated in HPI.    Assessment / Plan / Recommendations:  Please see A&P under problem oriented charting for assessment of the patient's acute and chronic medical conditions.  As always, pt is advised that if symptoms worsen or new symptoms arise, they should go to an urgent care facility or to to ER for further evaluation.   Consent and Medical Decision Making:  Patient discussed with Dr. Angelia Mould This is a telephone encounter between Meagan Dean and [place] on 06/22/2021 for cramping. The visit was conducted with the patient located at home and [place] [facility]. The patient's identity was confirmed using their DOB and current address. The patient has consented to being evaluated through a telephone encounter and  understands the associated risks (an examination cannot be done and the patient may need to come in for an appointment) / benefits (allows the patient to remain at home, decreasing exposure to coronavirus). I personally spent 15 minutes on medical discussion.

## 2021-06-22 NOTE — Telephone Encounter (Signed)
Patient called in c/o severe cramping in right foot at 3:30 AM and again at 7 AM. States she was "screaming, crying, praying, and sweating for 10 minutes until pain passed. States this only happens when her potassium is low. K+ 3.5 at OV on 05/22/21, but was 2.7 on 08/01/20. Patient took 4 caps of OTC potassium (99 mg caps). Please advise.

## 2021-06-22 NOTE — Progress Notes (Signed)
Patient called.  Advised to go to ED for severe hypokalemia.

## 2021-06-22 NOTE — Discharge Instructions (Addendum)
Call your primary care doctor or specialist as discussed in the next 2-3 days.   Return immediately back to the ER if:  Your symptoms worsen within the next 12-24 hours. You develop new symptoms such as new fevers, persistent vomiting, new pain, shortness of breath, or new weakness or numbness, or if you have any other concerns.  

## 2021-06-22 NOTE — Assessment & Plan Note (Signed)
Patient is calling for concerns of ongoing hand cramps, left worse than right. She has a history of hypokalemia and notes that this usually happens when her potassium is low. She notes that last night, she noted a severe cramp in her right foot that caused her to break into sweat due to the pain. This lasted for approximately 15 minutes before subsiding on its own. She notes similar episode again this morning around 7AM with associated left hand cramping. Patient has a history of persistent hypokalemia, previously requiring admission for IV replacement. Prior work up for primary hyperaldosteronism has been negative in 2018. At her last visit, patient noted to have K 3.5. She was prescribed spironolactone 12.5mg  daily. She has not been on daily potassium supplements but does note taking two over-the-counter potassium tablets today. Will obtain BMP today. If noted to have severe hypokalemia, may need to present to the ED for IV potassium replacement. If mild, will send in potassium supplements to patient's pharmacy and have her follow up in clinic next week.

## 2021-06-22 NOTE — ED Provider Notes (Signed)
[facility] EMERGENCY DEPT Provider Note   CSN: [id number] Arrival date & time: 06/22/21  1829     History  Chief Complaint  Patient presents with   Abnormal Lab    Meagan Dean is a 51 y.o. female with past medical history significant for previous lap band surgery of the stomach, history of recurrent hypokalemia who presents from her primary care with potassium of 2.6 at her PCP.  Patient reports for the last week she has been having finger and feet cramping, as well as feeling of heart palpitations.  She denies any chest pain, shortness of breath.  Patient reports that she has had 2 prior hospitalizations for potassium replacement.  Patient reports that she takes potassium supplementation but is not sure how much, also reports that she has been taking spironolactone in January.  Patient reports that she has been taking his medications as prescribed.  This patient reports that she was taking over-the-counter [place] potassium 99 mg dietary supplement prior to January, but has been taking 12.5 mg spironolactone daily since January.  She reports that she has not missed any doses.  The cramping symptoms started in the last week.   Abnormal Lab     Home Medications Prior to Admission medications   Medication Sig Start Date End Date Taking? Authorizing Provider  cetirizine (ZYRTEC) 10 MG tablet Take 10 mg by mouth daily as needed for allergies or rhinitis.     [provider]  fluticasone furoate-vilanterol (BREO ELLIPTA) 100-25 MCG/INH AEPB Inhale 1 puff into the lungs daily. Patient taking differently: Inhale 1 puff into the lungs daily as needed (wheezing).  08/18/18   Katherine Roan, MD  gabapentin (NEURONTIN) 300 MG capsule Take 1 capsule (300 mg total) by mouth at bedtime. Patient taking differently: Take 300 mg by mouth at bedtime as needed (pain).  07/29/19   Jeanmarie Hubert, MD  meloxicam (MOBIC) 15 MG tablet Take 15 mg by mouth daily as needed for  pain.    [provider]  ondansetron (ZOFRAN ODT) 8 MG disintegrating tablet 8mg  ODT q8 hours prn nausea 08/02/20   Palumbo, April, MD  ondansetron (ZOFRAN) 4 MG tablet Take 1 tablet (4 mg total) by mouth every 8 (eight) hours as needed for nausea or vomiting. 08/16/19   Hayden Rasmussen, MD  pseudoephedrine (SUDAFED) 120 MG 12 hr tablet Take 120 mg by mouth every 12 (twelve) hours as needed for congestion.    [provider]  spironolactone (ALDACTONE) 25 MG tablet Take 0.5 tablets (12.5 mg total) by mouth daily. 05/22/21 09/04/21  Rehman, Areeg N, DO  traMADol (ULTRAM) 50 MG tablet Take 1 tablet (50 mg total) by mouth every 6 (six) hours as needed for severe pain. 03/10/19   Lavonia Drafts, MD      Allergies    Other and Vicodin [hydrocodone-acetaminophen]    Review of Systems   Review of Systems  All other systems reviewed and are negative.  Physical Exam Updated Vital Signs BP 112/75    Pulse 97    Temp 98.4 F (36.9 C) (Oral)    Resp 20    Ht 5\' 3"  (1.6 m)    Wt 71.7 kg    LMP  (LMP Unknown) Comment: 07/2016   SpO2 100%    BMI 27.99 kg/m  Physical Exam Vitals and nursing note reviewed.  Constitutional:      General: She is not in acute distress.    Appearance: Normal appearance.  HENT:  Head: Normocephalic and atraumatic.  Eyes:     General:        Right eye: No discharge.        Left eye: No discharge.  Cardiovascular:     Rate and Rhythm: Normal rate and regular rhythm.     Heart sounds: No murmur heard.   No friction rub. No gallop.     Comments: No tachycardia, accessory heart sounds noted. Pulmonary:     Effort: Pulmonary effort is normal.     Breath sounds: Normal breath sounds.  Abdominal:     General: Bowel sounds are normal.     Palpations: Abdomen is soft.  Musculoskeletal:     Comments: Normal strength, appearance, range of motion of bilateral feet and hands.  Skin:    General: Skin is warm and dry.     Capillary Refill:  Capillary refill takes less than 2 seconds.  Neurological:     Mental Status: She is alert and oriented to person, place, and time.  Psychiatric:        Mood and Affect: Mood normal.        Behavior: Behavior normal.    ED Results / Procedures / Treatments   Labs (all labs ordered are listed, but only abnormal results are displayed) Labs Reviewed  CBC WITH DIFFERENTIAL/PLATELET - Abnormal; Notable for the following components:      Result Value   Hemoglobin 11.9 (*)    All other components within normal limits  BASIC METABOLIC PANEL - Abnormal; Notable for the following components:   Potassium 2.7 (*)    All other components within normal limits  RESP PANEL BY RT-PCR (FLU A&B, COVID) ARPGX2  MAGNESIUM  BRAIN NATRIURETIC PEPTIDE    EKG None  Radiology No results found.  Procedures Procedures    Medications Ordered in ED Medications  potassium chloride 10 mEq in 100 mL IVPB (10 mEq Intravenous New Bag/Given 06/22/21 2133)  potassium chloride SA (KLOR-CON M) CR tablet 40 mEq (40 mEq Oral Given 06/22/21 2105)    ED Course/ Medical Decision Making/ A&P                           Medical Decision Making Amount and/or Complexity of Data Reviewed Labs: ordered.  Risk Prescription drug management.   I discussed this case with my attending physician who cosigned this note including patient's presenting symptoms, physical exam, and planned diagnostics and interventions. Attending physician stated agreement with plan or made changes to plan which were implemented.   This is a 51 year old patient with past medical history of recurrent hypokalemia, previous lap band gastric surgery, previous hysterectomy, previous small bowel obstruction who presents with bilateral hand and feet cramping, feeling of heart palpitations.  Patient reports that she had her labs checked and was found to have potassium of 2.6 at her primary care doctor.  Patient denies active chest pain, shortness of  breath.  Patient has been hospitalized for this problem in the past, been evaluated for hypoaldosteronism, and other concerns, as recently as January of this year the thought was that it was of unknown etiology.  She has in the past been on 40 milliequivalents orally daily, as well as 12.5 mg spironolactone.  Patient reports that she has been taking the spironolactone since January, stopped taking an over-the-counter potassium supplement.  She is not currently taking any prescription potassium.  I personally interpreted and reviewed lab work today including normal magnesium, very mild anemia  with hemoglobin 11.9.  BMP does replicate findings that patient's primary care with potassium at 2.7.  We will orally administer 40 mEq, begin IV potassium.  I personally interpreted an EKG which shows normal sinus rhythm, no evidence of impact of hypokalemia, with widened QT, T wave flattening.  Patient is maintained on a cardiac monitor for the entirety of her visit.  At this time plan is to continue titration of IV potassium until she is received at least 20 mEq, given additional oral repletion, and recheck potassium to ensure that it is rising.  On recheck patient denying ongoing symptoms of hand cramping, heart palpitations.  9:47 PM Care of Meagan Dean transferred to Dr. Almyra Free at the end of my shift as the patient will require reassessment once labs/imaging have resulted. Patient presentation, ED course, and plan of care discussed with review of all pertinent labs and imaging. Please see his/her note for further details regarding further ED course and disposition. Plan at time of handoff is to recheck potassium and disposition based on this value. This may be altered or completely changed at the discretion of the oncoming team pending results of further workup.  Final Clinical Impression(s) / ED Diagnoses Final diagnoses:  None    Rx / DC Orders ED Discharge Orders     None         Dorien Chihuahua 06/22/21 2147    Luna Fuse, MD 06/22/21 [id number]

## 2021-06-22 NOTE — ED Triage Notes (Signed)
Pt reports starting spironolactone in January. Pt had labs drawn due to fingers and feet cramping. Labs showed potassium 2.6. Also reports palpitations at times.

## 2021-06-22 NOTE — ED Provider Notes (Addendum)
Patient potassium appears improved after interventions here.  Repeat potassium is 3.4.  Patient given another dose of 40 mEq KCl.  Advised continued potassium supplementation at home.  Advised outpatient follow-up with her doctor in 3 to 4 days for repeat evaluation.  Recommending immediate return if she has worsening symptoms pain or any additional concerns.  .Critical Care Performed by: Luna Fuse, MD Authorized by: Luna Fuse, MD   Critical care provider statement:    Critical care time (minutes):  30   Critical care time was exclusive of:  Separately billable procedures and treating other patients and teaching time   Critical care was necessary to treat or prevent imminent or life-threatening deterioration of the following conditions:  Metabolic crisis requiring emergent IV and oral oral replacement.    Luna Fuse, MD 06/22/21 2241    Luna Fuse, MD 06/22/21 2241

## 2021-06-25 NOTE — Progress Notes (Signed)
Internal Medicine Clinic Attending ° °Case discussed with Dr. Aslam  At the time of the visit.  We reviewed the resident’s history and exam and pertinent patient test results.  I agree with the assessment, diagnosis, and plan of care documented in the resident’s note.  °

## 2021-06-27 ENCOUNTER — Ambulatory Visit (INDEPENDENT_AMBULATORY_CARE_PROVIDER_SITE_OTHER): Payer: Self-pay | Admitting: Internal Medicine

## 2021-06-27 ENCOUNTER — Other Ambulatory Visit (INDEPENDENT_AMBULATORY_CARE_PROVIDER_SITE_OTHER): Payer: Self-pay

## 2021-06-27 DIAGNOSIS — E876 Hypokalemia: Secondary | ICD-10-CM

## 2021-06-27 LAB — BASIC METABOLIC PANEL
Anion gap: 10 (ref 5–15)
BUN: 11 mg/dL (ref 6–20)
CO2: 23 mmol/L (ref 22–32)
Calcium: 9.3 mg/dL (ref 8.9–10.3)
Chloride: 104 mmol/L (ref 98–111)
Creatinine, Ser: 0.85 mg/dL (ref 0.44–1.00)
GFR, Estimated: >60 mL/min (ref >60)
Glucose, Bld: 89 mg/dL (ref 70–99)
Potassium: 3.2 mmol/L — ABNORMAL LOW (ref 3.5–5.1)
Sodium: 137 mmol/L (ref 135–145)

## 2021-06-27 LAB — MAGNESIUM: Magnesium: 2 mg/dL (ref 1.7–2.4)

## 2021-06-27 MED ORDER — POTASSIUM CHLORIDE CRYS ER 20 MEQ PO TBCR
20.0000 meq | EXTENDED_RELEASE_TABLET | Freq: Every day | ORAL | 0 refills | Status: DC
Start: 2021-06-27 — End: 2021-07-12

## 2021-06-27 NOTE — Addendum Note (Signed)
Addended by: Buddy Duty on: 06/27/2021 01:19 PM   Modules accepted: Orders

## 2021-06-27 NOTE — Addendum Note (Signed)
Addended by: Marcelino Duster on: 06/27/2021 11:44 AM   Modules accepted: Orders

## 2021-06-27 NOTE — Progress Notes (Signed)
°  [facility] Internal Medicine Residency Telephone Encounter Continuity Care Appointment  HPI:  This telephone encounter was created for [name] on 06/27/2021 for the following purpose/cc hand and foot cramping, recurrent hypokalemia. Please see problem-based list for further details, assessments, and plans.   Past Medical History:  Past Medical History:  Diagnosis Date   Anemia    ASCUS with positive high risk HPV cervical 03/15/2017   Asthma due to seasonal allergies    MILD-- JUST GOT INHALER-not used yet -allergy induced   Calculus of ureter 06/16/2013   Endometriosis of pelvis    Fibroids 09/02/2017   GERD (gastroesophageal reflux disease)    diet controlled   H/O hiatal hernia    Headache    MIGRAINES - none since weight loss surgery   History of endometriosis 03/28/2017   History of hypertension    NO ISSUES SINCE WT LOSS AFTER GASTRIC BANDING   History of kidney stones    surgery to remove   History of obstructive sleep apnea    DX'D PRIOR TO GASTRIC BANDING  2010 --  NO ISSUES SINCE WT LOSS   Hypertension 03/28/2017   Low potassium syndrome    hopsitalized for 3 days   Right ureteral stone    Seasonal allergies    Sinus headache    Sleep apnea    prior to weight loss surgery, does not have cpap   Urgency of urination      ROS:  Negative except as stated in HPI   Assessment / Plan / Recommendations:  Please see A&P under problem oriented charting for assessment of the patient's acute and chronic medical conditions.  As always, pt is advised that if symptoms worsen or new symptoms arise, they should go to an urgent care facility or to to ER for further evaluation.   Consent and Medical Decision Making:  Patient discussed with Dr. Angelia Mould This is a telephone encounter between Meagan Dean and Meagan Dean Meagan Dean on 06/27/2021 for hand and foot cramping, as well as recurrent hypokalemia. The visit was conducted with the patient located at home and Meagan Dean at [facility]. The patient's identity was confirmed using their DOB and current address. The patient has consented to being evaluated through a telephone encounter and understands the associated risks (an examination cannot be done and the patient may need to come in for an appointment) / benefits (allows the patient to remain at home, decreasing exposure to coronavirus). I personally spent 15 minutes on medical discussion.

## 2021-06-27 NOTE — Assessment & Plan Note (Addendum)
Patient had telehealth appt on 2/10 regarding hand and foot cramping. Patient came in for lab work at that time, and was found to be hypokalemic at 2.6 and was advised to go to the ED. Patient then presented to the [facility] ED where she received IV and PO potassium supplementation, and subsequently had an improvement in her symptoms. She states that starting about 2 days ago, she began to develop the hand and foot cramping again. She has not taken her spironolactone since she left the ED, as she believes that this was causing her potassium to be low, although I counseled her on the mechanism of this medicine and that it should not cause her potassium to be low. She denies any chest pain, palpitations, abd pain, n/v/d, or urinary symptoms at this time. Recommended that the patient come in to the [facility] for lab work today. If she is severely hypokalemic again, she may need to go to the ED again for further IV supplementation vs being admitted for workup of her recurrent hypokalemia. If hypokalemia is mild, may be able to prescribe oral potassium supplements and reevaluate in 1-2 weeks.   Plan: - BMP + Mg - Aldosterone and renin/aldo activity - Urine K, Urine Cr (will calculate Urine K/Cr ratio)  Addendum: Mg 2.0 and K low at 3.2. Urine studies collected, but not yet resulted. Discussed BMP results with the patient and we are starting her on a potassium supplement- KClor 20 mEq daily. Will recheck BMP at next visit at end of the month.

## 2021-06-28 LAB — CREATININE, URINE, RANDOM: Creatinine, Urine: 415.9 mg/dL

## 2021-06-28 LAB — POTASSIUM, URINE, RANDOM: Potassium Urine: 38.6 mmol/L

## 2021-07-03 LAB — ALDOSTERONE + RENIN ACTIVITY W/ RATIO
ALDOS/RENIN RATIO: 2.3 (ref 0.0–30.0)
ALDOSTERONE: 20 ng/dL (ref 0.0–30.0)
Renin: 8.828 ng/mL/hr — ABNORMAL HIGH (ref 0.167–5.380)

## 2021-07-06 NOTE — Progress Notes (Signed)
Internal Medicine Clinic Attending ° °Case discussed with Dr. Atway  At the time of the visit.  We reviewed the resident’s history and exam and pertinent patient test results.  I agree with the assessment, diagnosis, and plan of care documented in the resident’s note.  °

## 2021-07-09 NOTE — Progress Notes (Signed)
° °  CC: follow up hypokalemia   HPI:  [name] is a 51 y.o. female with PMHx as stated below presenting for follow up of her hypokalemia. She endorses continued bilateral and and feet cramping. Please see problem based charting for complete assessment and plan.  Past Medical History:  Diagnosis Date   Anemia    ASCUS with positive high risk HPV cervical 03/15/2017   Asthma due to seasonal allergies    MILD-- JUST GOT INHALER-not used yet -allergy induced   Calculus of ureter 06/16/2013   Endometriosis of pelvis    Fibroids 09/02/2017   GERD (gastroesophageal reflux disease)    diet controlled   H/O hiatal hernia    Headache    MIGRAINES - none since weight loss surgery   History of endometriosis 03/28/2017   History of hypertension    NO ISSUES SINCE WT LOSS AFTER GASTRIC BANDING   History of kidney stones    surgery to remove   History of obstructive sleep apnea    DX'D PRIOR TO GASTRIC BANDING  2010 --  NO ISSUES SINCE WT LOSS   Hypertension 03/28/2017   Low potassium syndrome    hopsitalized for 3 days   Right ureteral stone    Seasonal allergies    Sinus headache    Sleep apnea    prior to weight loss surgery, does not have cpap   Urgency of urination    Review of Systems:  Negative except as stated in HPI.  Physical Exam:  Vitals:   07/10/21 0856  BP: 113/82  Pulse: 89  Temp: 98.9 F (37.2 C)  TempSrc: Oral  SpO2: 100%  Weight: 167 lb (75.8 kg)  Height: 5\' 3"  (1.6 m)   Physical Exam  Constitutional: Appears well-developed and well-nourished. No distress.  Cardiovascular: Normal rate, regular rhythm, S1 and S2 present, no murmurs, rubs, gallops.  Distal pulses intact Respiratory: No respiratory distress Lungs are clear to auscultation bilaterally. Musculoskeletal: Normal bulk and tone.  No peripheral edema noted. Neurological: Is alert and oriented x4, strength 5/5 in BLE and BUE; sensation to light touch grossly intact  Skin: Warm and dry.  No rash,  erythema, lesions noted. Psychiatric: Normal mood and affect.    Assessment & Plan:   See Encounters Tab for problem based charting.  Patient discussed with Dr.  Saverio Danker

## 2021-07-10 ENCOUNTER — Encounter: Payer: Self-pay | Admitting: Internal Medicine

## 2021-07-10 ENCOUNTER — Ambulatory Visit (INDEPENDENT_AMBULATORY_CARE_PROVIDER_SITE_OTHER): Payer: Self-pay | Admitting: Internal Medicine

## 2021-07-10 VITALS — BP 113/82 | HR 89 | Temp 98.9°F | Ht 63.0 in | Wt 167.0 lb

## 2021-07-10 DIAGNOSIS — E876 Hypokalemia: Secondary | ICD-10-CM

## 2021-07-10 DIAGNOSIS — I10 Essential (primary) hypertension: Secondary | ICD-10-CM

## 2021-07-10 NOTE — Patient Instructions (Addendum)
Ms Prima Rayner,  It was a pleasure seeing you in clinic. Today we discussed:   Potassium: We are checking on your potassium levels today. I will call you with the results and any medication changes.  Healthcare: Please call and schedule your mammogram and pap smear at your earliest convenience.   If you have any questions or concerns, please call our clinic at [phone] between 9am-5pm and after hours call [phone] and ask for the internal medicine resident on call. If you feel you are having a medical emergency please call 911.   Thank you, we look forward to helping you remain healthy!

## 2021-07-10 NOTE — Assessment & Plan Note (Addendum)
Patient with history of hypokalemia of unclear etiology is presenting for follow up of her hypokalemia. She was evaluated on 06/27/2021 via telehealth for symptoms of bilateral hand and foot cramping. She was noted to have decreased potassium at 3.2 and mag 2.0. Urine studies obtained at the time with urine K/Cr ratio of 1.05, concerning for possible transcellular potassium shifts, GI losses, prior diuretic use or poor dietary intake. She is not on insulin therapy or any medications that could cause intracellular potassium shifts. Although she did have lap banding procedure in 2010, this would not cause impaired absorption of potassium. She is not currently on diuretic therapy and does have proper nutrition.  Prior work up negative for hyperaldosteronism. No acidosis noted on prior labs that would point towards RTA.  Unclear etiology at this time. Could consider evaluation for Gitelman or Bartter syndrome in setting of normal blood pressure with measuring urine chloride at next visit.   Plan: Repeat BMP today If hypokalemia persistent, will increase potassium supplement and have patient follow up in 4 weeks    ADDENDUM: K 4.1 at this visit. Will continue with current potassium dosing of 20 mEq daily.

## 2021-07-10 NOTE — Assessment & Plan Note (Signed)
BP Readings from Last 3 Encounters:  07/10/21 113/82  06/22/21 (!) 130/98  05/22/21 137/76   Patient's hypertension is currently well controlled off spironolactone.   Plan: Continue to monitor off antihypertensives

## 2021-07-11 ENCOUNTER — Other Ambulatory Visit: Payer: Self-pay | Admitting: Internal Medicine

## 2021-07-11 ENCOUNTER — Telehealth: Payer: Self-pay | Admitting: Internal Medicine

## 2021-07-11 LAB — BMP8+ANION GAP
Anion Gap: 11 mmol/L (ref 10.0–18.0)
BUN/Creatinine Ratio: 21 (ref 9–23)
BUN: 15 mg/dL (ref 6–24)
CO2: 26 mmol/L (ref 20–29)
Calcium: 9.3 mg/dL (ref 8.7–10.2)
Chloride: 101 mmol/L (ref 96–106)
Creatinine, Ser: 0.72 mg/dL (ref 0.57–1.00)
Glucose: 85 mg/dL (ref 70–99)
Potassium: 4.1 mmol/L (ref 3.5–5.2)
Sodium: 138 mmol/L (ref 134–144)
eGFR: 102 mL/min/{1.73_m2} (ref >59)

## 2021-07-11 NOTE — Progress Notes (Signed)
Internal Medicine Clinic Attending  Case discussed with Dr. Marva Panda  At the time of the visit.  We reviewed the residents history and exam and pertinent patient test results.  I agree with the assessment, diagnosis, and plan of care documented in the residents note. Patient noted to resident that she stopped spironolactone due to poor tolerance.

## 2021-07-11 NOTE — Telephone Encounter (Signed)
Refill Request ? ? ?potassium chloride SA (KLOR-CON M) 20 MEQ tablet ? ? ? ?[id number] - [place], [place] [address] (Ph: [phone] ?

## 2021-07-11 NOTE — Telephone Encounter (Signed)
Potassium 3.2 on BMP drawn yesterday. ?

## 2021-07-12 MED ORDER — POTASSIUM CHLORIDE CRYS ER 20 MEQ PO TBCR: 20.0000 meq | EXTENDED_RELEASE_TABLET | Freq: Every day | ORAL | 0 refills | Status: DC

## 2021-07-12 NOTE — Addendum Note (Signed)
Addended by: Harvie Heck on: 07/12/2021 10:22 AM   Modules accepted: Orders

## 2021-07-26 ENCOUNTER — Emergency Department (HOSPITAL_COMMUNITY): Payer: Commercial Managed Care - PPO

## 2021-07-26 ENCOUNTER — Inpatient Hospital Stay (HOSPITAL_COMMUNITY): Payer: Commercial Managed Care - PPO

## 2021-07-26 ENCOUNTER — Inpatient Hospital Stay (HOSPITAL_COMMUNITY)
Admission: EM | Admit: 2021-07-26 | Discharge: 2021-08-02 | DRG: 389 | Disposition: A | Discharge status: Home / Self Care | Payer: Commercial Managed Care - PPO | Attending: Internal Medicine | Admitting: Internal Medicine

## 2021-07-26 ENCOUNTER — Telehealth: Payer: Self-pay | Admitting: *Deleted

## 2021-07-26 ENCOUNTER — Encounter (HOSPITAL_COMMUNITY): Payer: Self-pay | Admitting: Emergency Medicine

## 2021-07-26 ENCOUNTER — Other Ambulatory Visit: Payer: Self-pay

## 2021-07-26 DIAGNOSIS — R197 Diarrhea, unspecified: Secondary | ICD-10-CM

## 2021-07-26 DIAGNOSIS — R202 Paresthesia of skin: Secondary | ICD-10-CM | POA: Diagnosis present

## 2021-07-26 DIAGNOSIS — Z886 Allergy status to analgesic agent status: Secondary | ICD-10-CM | POA: Diagnosis not present

## 2021-07-26 DIAGNOSIS — K429 Umbilical hernia without obstruction or gangrene: Secondary | ICD-10-CM | POA: Diagnosis present

## 2021-07-26 DIAGNOSIS — N179 Acute kidney failure, unspecified: Secondary | ICD-10-CM | POA: Diagnosis present

## 2021-07-26 DIAGNOSIS — Z791 Long term (current) use of non-steroidal anti-inflammatories (NSAID): Secondary | ICD-10-CM

## 2021-07-26 DIAGNOSIS — J45909 Unspecified asthma, uncomplicated: Secondary | ICD-10-CM | POA: Diagnosis present

## 2021-07-26 DIAGNOSIS — R200 Anesthesia of skin: Secondary | ICD-10-CM | POA: Diagnosis present

## 2021-07-26 DIAGNOSIS — Z0189 Encounter for other specified special examinations: Secondary | ICD-10-CM

## 2021-07-26 DIAGNOSIS — Z20822 Contact with and (suspected) exposure to covid-19: Secondary | ICD-10-CM | POA: Diagnosis present

## 2021-07-26 DIAGNOSIS — Z823 Family history of stroke: Secondary | ICD-10-CM

## 2021-07-26 DIAGNOSIS — I959 Hypotension, unspecified: Secondary | ICD-10-CM | POA: Diagnosis present

## 2021-07-26 DIAGNOSIS — E876 Hypokalemia: Secondary | ICD-10-CM | POA: Diagnosis present

## 2021-07-26 DIAGNOSIS — Z7951 Long term (current) use of inhaled steroids: Secondary | ICD-10-CM | POA: Diagnosis not present

## 2021-07-26 DIAGNOSIS — E663 Overweight: Secondary | ICD-10-CM | POA: Diagnosis present

## 2021-07-26 DIAGNOSIS — Z79899 Other long term (current) drug therapy: Secondary | ICD-10-CM

## 2021-07-26 DIAGNOSIS — R61 Generalized hyperhidrosis: Secondary | ICD-10-CM | POA: Diagnosis present

## 2021-07-26 DIAGNOSIS — K219 Gastro-esophageal reflux disease without esophagitis: Secondary | ICD-10-CM | POA: Diagnosis present

## 2021-07-26 DIAGNOSIS — Z9884 Bariatric surgery status: Secondary | ICD-10-CM | POA: Diagnosis not present

## 2021-07-26 DIAGNOSIS — Z6829 Body mass index (BMI) 29.0-29.9, adult: Secondary | ICD-10-CM | POA: Diagnosis not present

## 2021-07-26 DIAGNOSIS — K56609 Unspecified intestinal obstruction, unspecified as to partial versus complete obstruction: Secondary | ICD-10-CM

## 2021-07-26 DIAGNOSIS — Z90710 Acquired absence of both cervix and uterus: Secondary | ICD-10-CM | POA: Diagnosis not present

## 2021-07-26 DIAGNOSIS — R112 Nausea with vomiting, unspecified: Secondary | ICD-10-CM | POA: Diagnosis present

## 2021-07-26 DIAGNOSIS — K56600 Partial intestinal obstruction, unspecified as to cause: Secondary | ICD-10-CM | POA: Diagnosis present

## 2021-07-26 DIAGNOSIS — Z800 Family history of malignant neoplasm of digestive organs: Secondary | ICD-10-CM | POA: Diagnosis not present

## 2021-07-26 DIAGNOSIS — I10 Essential (primary) hypertension: Secondary | ICD-10-CM | POA: Diagnosis present

## 2021-07-26 DIAGNOSIS — Z91040 Latex allergy status: Secondary | ICD-10-CM

## 2021-07-26 HISTORY — DX: Unspecified intestinal obstruction, unspecified as to partial versus complete obstruction: K56.609

## 2021-07-26 LAB — DIFFERENTIAL
Abs Immature Granulocytes: 0.08 10*3/uL — ABNORMAL HIGH (ref 0.00–0.07)
Basophils Absolute: 0 10*3/uL (ref 0.0–0.1)
Basophils Relative: 0 %
Eosinophils Absolute: 0 10*3/uL (ref 0.0–0.5)
Eosinophils Relative: 0 %
Immature Granulocytes: 1 %
Lymphocytes Relative: 5 %
Lymphs Abs: 0.7 10*3/uL (ref 0.7–4.0)
Monocytes Absolute: 0.3 10*3/uL (ref 0.1–1.0)
Monocytes Relative: 2 %
Neutro Abs: 12.5 10*3/uL — ABNORMAL HIGH (ref 1.7–7.7)
Neutrophils Relative %: 92 %

## 2021-07-26 LAB — COMPREHENSIVE METABOLIC PANEL
ALT: 13 U/L (ref 0–44)
AST: 21 U/L (ref 15–41)
Albumin: 4.7 g/dL (ref 3.5–5.0)
Alkaline Phosphatase: 80 U/L (ref 38–126)
Anion gap: 10 (ref 5–15)
BUN: 26 mg/dL — ABNORMAL HIGH (ref 6–20)
CO2: 25 mmol/L (ref 22–32)
Calcium: 9.9 mg/dL (ref 8.9–10.3)
Chloride: 103 mmol/L (ref 98–111)
Creatinine, Ser: 1.07 mg/dL — ABNORMAL HIGH (ref 0.44–1.00)
GFR, Estimated: >60 mL/min (ref >60)
Glucose, Bld: 125 mg/dL — ABNORMAL HIGH (ref 70–99)
Potassium: 3.6 mmol/L (ref 3.5–5.1)
Sodium: 138 mmol/L (ref 135–145)
Total Bilirubin: 0.5 mg/dL (ref 0.3–1.2)
Total Protein: 9.2 g/dL — ABNORMAL HIGH (ref 6.5–8.1)

## 2021-07-26 LAB — I-STAT BETA HCG BLOOD, ED (MC, WL, AP ONLY): I-stat hCG, quantitative: <5 m[IU]/mL (ref <5)

## 2021-07-26 LAB — CBC
HCT: 44.3 % (ref 36.0–46.0)
Hemoglobin: 14.5 g/dL (ref 12.0–15.0)
MCH: 27.9 pg (ref 26.0–34.0)
MCHC: 32.7 g/dL (ref 30.0–36.0)
MCV: 85.2 fL (ref 80.0–100.0)
Platelets: 467 10*3/uL — ABNORMAL HIGH (ref 150–400)
RBC: 5.2 MIL/uL — ABNORMAL HIGH (ref 3.87–5.11)
RDW: 13.9 % (ref 11.5–15.5)
WBC: 13.6 10*3/uL — ABNORMAL HIGH (ref 4.0–10.5)
nRBC: 0 % (ref 0.0–0.2)

## 2021-07-26 LAB — RESP PANEL BY RT-PCR (FLU A&B, COVID) ARPGX2
Influenza A by PCR: NEGATIVE
Influenza B by PCR: NEGATIVE
SARS Coronavirus 2 by RT PCR: NEGATIVE

## 2021-07-26 LAB — LIPASE, BLOOD: Lipase: 44 U/L (ref 11–51)

## 2021-07-26 MED ORDER — ACETAMINOPHEN 325 MG PO TABS
650.0000 mg | ORAL_TABLET | Freq: Four times a day (QID) | ORAL | Status: DC | PRN
  Administered 2021-08-02: 650 mg via ORAL
  Filled 2021-07-26: qty 2

## 2021-07-26 MED ORDER — SODIUM CHLORIDE 0.9 % IV BOLUS
1000.0000 mL | Freq: Once | INTRAVENOUS | Status: AC
  Administered 2021-07-26: 1000 mL via INTRAVENOUS

## 2021-07-26 MED ORDER — ONDANSETRON HCL 4 MG/2ML IJ SOLN
4.0000 mg | Freq: Once | INTRAMUSCULAR | Status: AC
  Administered 2021-07-26: 4 mg via INTRAVENOUS
  Filled 2021-07-26: qty 2

## 2021-07-26 MED ORDER — HYDROMORPHONE HCL 1 MG/ML IJ SOLN
1.0000 mg | Freq: Once | INTRAMUSCULAR | Status: AC
  Administered 2021-07-26: 1 mg via INTRAVENOUS
  Filled 2021-07-26: qty 1

## 2021-07-26 MED ORDER — LIP MEDEX EX OINT
TOPICAL_OINTMENT | CUTANEOUS | Status: DC | PRN
  Administered 2021-07-26: 1 via TOPICAL
  Filled 2021-07-26: qty 7

## 2021-07-26 MED ORDER — ONDANSETRON HCL 4 MG/2ML IJ SOLN
4.0000 mg | Freq: Four times a day (QID) | INTRAMUSCULAR | Status: DC | PRN
  Administered 2021-07-26 – 2021-08-01 (×11): 4 mg via INTRAVENOUS
  Filled 2021-07-26 (×11): qty 2

## 2021-07-26 MED ORDER — MORPHINE SULFATE (PF) 2 MG/ML IV SOLN
1.0000 mg | INTRAVENOUS | Status: DC | PRN
  Administered 2021-07-26 – 2021-08-01 (×12): 1 mg via INTRAVENOUS
  Filled 2021-07-26 (×13): qty 1

## 2021-07-26 MED ORDER — IOHEXOL 300 MG/ML  SOLN
100.0000 mL | Freq: Once | INTRAMUSCULAR | Status: AC | PRN
  Administered 2021-07-26: 100 mL via INTRAVENOUS

## 2021-07-26 MED ORDER — FENTANYL CITRATE PF 50 MCG/ML IJ SOSY
50.0000 ug | PREFILLED_SYRINGE | Freq: Once | INTRAMUSCULAR | Status: AC
  Administered 2021-07-26: 50 ug via INTRAVENOUS
  Filled 2021-07-26: qty 1

## 2021-07-26 MED ORDER — DEXTROSE IN LACTATED RINGERS 5 % IV SOLN: INTRAVENOUS | Status: AC

## 2021-07-26 MED ORDER — ACETAMINOPHEN 650 MG RE SUPP: 650.0000 mg | Freq: Four times a day (QID) | RECTAL | Status: DC | PRN

## 2021-07-26 NOTE — ED Triage Notes (Addendum)
BIB EMS from home, complains of abdominal pain, nausea. Hx of bowel obstruction per EMS. Ne emesis. Pt also thinks her K might be low. EMS gave Zofran and 100 mcg fentanyl en route. ?

## 2021-07-26 NOTE — H&P (Signed)
?History and Physical  ? ? ?Meagan Dean TDS:[id number] DOB: January 24, 1971 DOA: 07/26/2021 ? ?PCP: Harvie Heck, MD  ?Patient coming from: Home. ? ?Chief Complaint: Abdominal pain. ? ?HPI: Meagan Dean is a 51 y.o. female with history of prior small bowel obstruction requiring exploratory laparotomy and lysis of adhesions in 2017 presents to the ER with complaints of abdominal pain nausea vomiting since 1 AM this morning.  Patient states she had some crabs last evening and since this morning starting abdominal discomfort vomiting patient also had 4 episodes of watery diarrhea and subjective fever fever chills.  Patient also had some tingling and numbness of the upper and lower extremities which patient states she usually gets when her potassium is low.  Abdominal pain is diffuse. ? ?ED Course: In the ER CT abdomen pelvis shows features concerning for distal small bowel obstruction.  On-call general surgeon Dr. Marlou Starks was consulted and patient admitted for further work-up her NG tube has been ordered.  Labs show mild leukocytosis.  COVID test was negative.  Creatinine increased from baseline of 0.7 it is around 1.07. ? ?Review of Systems: As per HPI, rest all negative. ? ? ?Past Medical History:  ?Diagnosis Date  ? Anemia   ? ASCUS with positive high risk HPV cervical 03/15/2017  ? Asthma due to seasonal allergies   ? MILD-- JUST GOT INHALER-not used yet -allergy induced  ? Calculus of ureter 06/16/2013  ? Endometriosis of pelvis   ? Fibroids 09/02/2017  ? GERD (gastroesophageal reflux disease)   ? diet controlled  ? H/O hiatal hernia   ? Headache   ? MIGRAINES - none since weight loss surgery  ? History of endometriosis 03/28/2017  ? History of hypertension   ? NO ISSUES SINCE WT LOSS AFTER GASTRIC BANDING  ? History of kidney stones   ? surgery to remove  ? History of obstructive sleep apnea   ? DX'D PRIOR TO GASTRIC BANDING  2010 --  NO ISSUES SINCE WT LOSS  ? Hypertension 03/28/2017  ? Low potassium syndrome   ?  hopsitalized for 3 days  ? Right ureteral stone   ? Seasonal allergies   ? Sinus headache   ? Sleep apnea   ? prior to weight loss surgery, does not have cpap  ? Urgency of urination   ? ? ?Past Surgical History:  ?Procedure Laterality Date  ? ABDOMINAL HYSTERECTOMY    ? APPENDECTOMY    ? bowel obstruction  2017  ? CYSTOSCOPY WITH RETROGRADE PYELOGRAM, URETEROSCOPY AND STENT PLACEMENT Right 06/18/2013  ? Procedure: CYSTOSCOPY WITH RETROGRADE PYELOGRAM, URETEROSCOPY AND STENT PLACEMENT;  Surgeon: Franchot Gallo, MD;  Location: [facility];  Service: Urology;  Laterality: Right;  ? CYSTOSCOPY WITH RETROGRADE PYELOGRAM, URETEROSCOPY AND STENT PLACEMENT Right 07/05/2013  ? Procedure: CYSTOSCOPY , URETEROSCOPY ANDstone extraction;  Surgeon: Franchot Gallo, MD;  [facility];  Service: Urology;  Laterality: Right;  ? EXPLORATORY LAPARTOMY/  MYOMECTOMY  2005  ? HYSTERECTOMY ABDOMINAL WITH SALPINGECTOMY Bilateral 09/02/2017  ? Procedure: HYSTERECTOMY ABDOMINAL WITH SALPINGECTOMY;  Surgeon: Lavonia Drafts, MD;  Location: [facility];  Service: Gynecology;  Laterality: Bilateral;  ? LAPAROSCOPIC APPENDECTOMY N/A 07/28/2014  ? Procedure: APPENDECTOMY LAPAROSCOPIC;  Surgeon: Jackolyn Confer, MD;  Location: [facility];  Service: General;  Laterality: N/A;  ? LAPAROSCOPIC GASTRIC BANDING  06-06-2008  ? LAPAROSCOPY /  OVARIAN CYSTECTOMY/  LASER ABLATION ENDOMETRIOSIS  2003  ? LAPAROTOMY N/A 11/24/2015  ? Procedure: EXPLORATORY LAPAROTOMY;  Surgeon: Georganna Skeans, MD;  Location: [facility];  Service: General;  Laterality: N/A;  ? LYSIS OF ADHESION N/A 11/24/2015  ? Procedure: LYSIS OF ADHESION;  Surgeon: Georganna Skeans, MD;  Location: [facility];  Service: General;  Laterality: N/A;  ? LYSIS OF ADHESION N/A 09/02/2017  ? Procedure: LYSIS OF ADHESION;  Surgeon: Lavonia Drafts, MD;  Location: [facility];  Service: Gynecology;  Laterality: N/A;  ? wisdom teeth ext    ? ? ? reports that she has never smoked. She has never  used smokeless tobacco. She reports current alcohol use. She reports that she does not use drugs. ? ?Allergies  ?Allergen Reactions  ? Latex Itching  ? Vicodin [Hydrocodone-Acetaminophen] Other (See Comments)  ?  Made her feel "high" ? ?Has had dilaudid before and tolerated it well  ? ? ?Family History  ?Problem Relation Age of Onset  ? Cancer Maternal Grandmother   ?     colon/liver  ? Stroke Father   ? ? ?Prior to Admission medications   ?Medication Sig Start Date End Date Taking? Authorizing Provider  ?fluticasone furoate-vilanterol (BREO ELLIPTA) 100-25 MCG/INH AEPB Inhale 1 puff into the lungs daily. ?Patient taking differently: Inhale 1 puff into the lungs daily as needed (wheezing). 08/18/18  Yes Katherine Roan, MD  ?loratadine (CLARITIN) 10 MG tablet Take 10 mg by mouth daily.   Yes [provider]  ?potassium chloride SA (KLOR-CON M) 20 MEQ tablet Take 1 tablet (20 mEq total) by mouth daily. 07/12/21  Yes Aslam, Loralyn Freshwater, MD  ?pseudoephedrine (SUDAFED) 120 MG 12 hr tablet Take 120 mg by mouth every 12 (twelve) hours as needed for congestion.   Yes [provider]  ?sodium chloride (OCEAN) 0.65 % SOLN nasal spray Place 1 spray into both nostrils as needed for congestion.   Yes [provider]  ? ? ?Physical Exam: ?Constitutional: Moderately built and nourished. ?Vitals:  ? 07/26/21 1605 07/26/21 1645 07/26/21 1700 07/26/21 1810  ?BP: 117/86 (!) 120/96  (!) 143/102  ?Pulse: (!) 122 (!) 110 (!) 110 96  ?Resp: 18  20 (!) 24  ?Temp: 98.9 ?F (37.2 ?C)     ?TempSrc: Oral     ?SpO2: 100% 100% 100% 100%  ? ?Eyes: Anicteric no pallor. ?ENMT: No discharge from the ears eyes nose and mouth. ?Neck: No mass felt.  No neck rigidity. ?Respiratory: No rhonchi or crepitations. ?Cardiovascular: S1-S2 heard. ?Abdomen: Soft nontender bowel sounds not appreciated. ?Musculoskeletal: No edema. ?Skin: No rash. ?Neurologic: Alert awake oriented to time place and person.  Moves all extremities. ?Psychiatric:  Appears normal.  Normal affect. ? ? ?Labs on Admission: I have personally reviewed following labs and imaging studies ? ?CBC: ?Recent Labs  ?Lab 07/26/21 ?1615  ?WBC 13.6*  ?NEUTROABS 12.5*  ?HGB 14.5  ?HCT 44.3  ?MCV 85.2  ?PLT 467*  ? ?Basic Metabolic Panel: ?Recent Labs  ?Lab 07/26/21 ?1615  ?NA 138  ?K 3.6  ?CL 103  ?CO2 25  ?GLUCOSE 125*  ?BUN 26*  ?CREATININE 1.07*  ?CALCIUM 9.9  ? ?GFR: ?CrCl cannot be calculated (Unknown ideal weight.). ?Liver Function Tests: ?Recent Labs  ?Lab 07/26/21 ?1615  ?AST 21  ?ALT 13  ?ALKPHOS 80  ?BILITOT 0.5  ?PROT 9.2*  ?ALBUMIN 4.7  ? ?Recent Labs  ?Lab 07/26/21 ?1615  ?LIPASE 44  ? ?No results for input(s): AMMONIA in the last 168 hours. ?Coagulation Profile: ?No results for input(s): INR, PROTIME in the last 168 hours. ?Cardiac Enzymes: ?No results for input(s): CKTOTAL, CKMB, CKMBINDEX, TROPONINI in  the last 168 hours. ?BNP (last 3 results) ?No results for input(s): PROBNP in the last 8760 hours. ?HbA1C: ?No results for input(s): HGBA1C in the last 72 hours. ?CBG: ?No results for input(s): GLUCAP in the last 168 hours. ?Lipid Profile: ?No results for input(s): CHOL, HDL, LDLCALC, TRIG, CHOLHDL, LDLDIRECT in the last 72 hours. ?Thyroid Function Tests: ?No results for input(s): TSH, T4TOTAL, FREET4, T3FREE, THYROIDAB in the last 72 hours. ?Anemia Panel: ?No results for input(s): VITAMINB12, FOLATE, FERRITIN, TIBC, IRON, RETICCTPCT in the last 72 hours. ?Urine analysis: ?   ?Component Value Date/Time  ? COLORURINE AMBER (A) 08/01/2020 1807  ? APPEARANCEUR HAZY (A) 08/01/2020 1807  ? APPEARANCEUR Turbid (A) 11/16/2018 1200  ? LABSPEC 1.028 08/01/2020 1807  ? PHURINE 5.0 08/01/2020 1807  ? GLUCOSEU NEGATIVE 08/01/2020 1807  ? [place] NEGATIVE 08/01/2020 1807  ? [place] NEGATIVE 08/01/2020 1807  ? BILIRUBINUR Small 08/12/2019 1523  ? BILIRUBINUR Negative 11/16/2018 1200  ? KETONESUR 5 (A) 08/01/2020 1807  ? PROTEINUR 30 (A) 08/01/2020 1807  ? UROBILINOGEN 1.0 08/12/2019 1523  ?  UROBILINOGEN 0.2 07/28/2014 1310  ? NITRITE NEGATIVE 08/01/2020 1807  ? LEUKOCYTESUR NEGATIVE 08/01/2020 1807  ? ?Sepsis Labs: ?'@LABRCNTIP'$ (procalcitonin:4,lacticidven:4) ?)No results found for this or any prev

## 2021-07-26 NOTE — ED Provider Notes (Signed)
?[facility] DEPT ?Provider Note ? ? ?CSN: [id number] ?Arrival date & time: 07/26/21  1600 ? ?  ? ?History ? ?No chief complaint on file. ? ? ?Meagan Dean is a 51 y.o. female. ? ?HPI ? ?Patient with medical history of hypokalemia, partial small bowel obstruction, hypertension, asthma presents today due to abdominal pain.  The pain is everywhere, worse in the lower abdomen.  It is constant, associated with nausea.  She is also reporting vomiting and diarrhea states is similar to her previous bowel obstruction.  She denies any acute constipation but states she has not passed a bowel movement in over 48 hours and is not passing gas.  No fevers at home, no chest pain or shortness of breath, ? ?Patient is complicated abdominal surgical history including abdominal hysterectomy with bilateral salpingectomy, appendectomy, cystoscopy, previous bowel obstruction in 2017 secondary to adhesions. ? ?Home Medications ?Prior to Admission medications   ?Medication Sig Start Date End Date Taking? Authorizing Provider  ?cetirizine (ZYRTEC) 10 MG tablet Take 10 mg by mouth daily as needed for allergies or rhinitis.     [provider]  ?fluticasone furoate-vilanterol (BREO ELLIPTA) 100-25 MCG/INH AEPB Inhale 1 puff into the lungs daily. ?Patient taking differently: Inhale 1 puff into the lungs daily as needed (wheezing).  08/18/18   Katherine Roan, MD  ?gabapentin (NEURONTIN) 300 MG capsule Take 1 capsule (300 mg total) by mouth at bedtime. ?Patient taking differently: Take 300 mg by mouth at bedtime as needed (pain).  07/29/19   Jeanmarie Hubert, MD  ?meloxicam (MOBIC) 15 MG tablet Take 15 mg by mouth daily as needed for pain.    [provider]  ?ondansetron (ZOFRAN ODT) 8 MG disintegrating tablet '8mg'$  ODT q8 hours prn nausea 08/02/20   Palumbo, April, MD  ?ondansetron (ZOFRAN) 4 MG tablet Take 1 tablet (4 mg total) by mouth every 8 (eight) hours as needed for nausea or vomiting. 08/16/19    Hayden Rasmussen, MD  ?potassium chloride SA (KLOR-CON M) 20 MEQ tablet Take 1 tablet (20 mEq total) by mouth daily. 07/12/21   Harvie Heck, MD  ?pseudoephedrine (SUDAFED) 120 MG 12 hr tablet Take 120 mg by mouth every 12 (twelve) hours as needed for congestion.    [provider]  ?traMADol (ULTRAM) 50 MG tablet Take 1 tablet (50 mg total) by mouth every 6 (six) hours as needed for severe pain. 03/10/19   Lavonia Drafts, MD  ?   ? ?Allergies    ?Other and Vicodin [hydrocodone-acetaminophen]   ? ?Review of Systems   ?Review of Systems ? ?Physical Exam ?Updated Vital Signs ?BP (!) 143/102   Pulse 96   Temp 98.9 ?F (37.2 ?C) (Oral)   Resp (!) 24   LMP  (LMP Unknown) Comment: 07/2016  SpO2 100%  ?Physical Exam ?Vitals and nursing note reviewed. Exam conducted with a chaperone present.  ?Constitutional:   ?   Appearance: Normal appearance.  ?HENT:  ?   Head: Normocephalic and atraumatic.  ?Eyes:  ?   General: No scleral icterus.    ?   Right eye: No discharge.     ?   Left eye: No discharge.  ?   Extraocular Movements: Extraocular movements intact.  ?   Pupils: Pupils are equal, round, and reactive to light.  ?Cardiovascular:  ?   Rate and Rhythm: Regular rhythm. Tachycardia present.  ?   Pulses: Normal pulses.  ?   Heart sounds: Normal heart sounds. No murmur  heard. ?  No friction rub. No gallop.  ?Pulmonary:  ?   Effort: Pulmonary effort is normal. No respiratory distress.  ?   Breath sounds: Normal breath sounds.  ?Abdominal:  ?   General: Abdomen is flat. Bowel sounds are normal. There is no distension.  ?   Palpations: Abdomen is soft.  ?   Tenderness: There is abdominal tenderness.  ?Skin: ?   General: Skin is warm and dry.  ?   Coloration: Skin is not jaundiced.  ?Neurological:  ?   Mental Status: She is alert. Mental status is at baseline.  ?   Coordination: Coordination normal.  ? ? ?ED Results / Procedures / Treatments   ?Labs ?(all labs ordered are listed, but only abnormal results are  displayed) ?Labs Reviewed  ?COMPREHENSIVE METABOLIC PANEL - Abnormal; Notable for the following components:  ?    Result Value  ? Glucose, Bld 125 (*)   ? BUN 26 (*)   ? Creatinine, Ser 1.07 (*)   ? Total Protein 9.2 (*)   ? All other components within normal limits  ?CBC - Abnormal; Notable for the following components:  ? WBC 13.6 (*)   ? RBC 5.20 (*)   ? Platelets 467 (*)   ? All other components within normal limits  ?DIFFERENTIAL - Abnormal; Notable for the following components:  ? Neutro Abs 12.5 (*)   ? Abs Immature Granulocytes 0.08 (*)   ? All other components within normal limits  ?RESP PANEL BY RT-PCR (FLU A&B, COVID) ARPGX2  ?LIPASE, BLOOD  ?URINALYSIS, ROUTINE W REFLEX MICROSCOPIC  ?I-STAT BETA HCG BLOOD, ED ([facility], [facility], [facility] ONLY)  ? ? ?EKG ?None ? ?Radiology ?CT Abdomen Pelvis W Contrast ? ?Result Date: 07/26/2021 ?CLINICAL DATA:  Nausea and vomiting, evaluate for bowel obstruction. EXAM: CT ABDOMEN AND PELVIS WITH CONTRAST TECHNIQUE: Multidetector CT imaging of the abdomen and pelvis was performed using the standard protocol following bolus administration of intravenous contrast. RADIATION DOSE REDUCTION: This exam was performed according to the departmental dose-optimization program which includes automated exposure control, adjustment of the mA and/or kV according to patient size and/or use of iterative reconstruction technique. CONTRAST:  176m OMNIPAQUE IOHEXOL 300 MG/ML  SOLN COMPARISON:  CT abdomen and pelvis 08/02/2020 FINDINGS: Lower chest: No acute abnormality. Hepatobiliary: There is a rounded hypodensity in the right lobe of the liver which is too small to characterize, possibly a cyst or hemangioma. The gallbladder and bile ducts are within normal limits. Pancreas: Unremarkable. No pancreatic ductal dilatation or surrounding inflammatory changes. Spleen: Normal in size without focal abnormality. Adrenals/Urinary Tract: Adrenal glands are unremarkable. Kidneys are normal, without renal calculi,  focal lesion, or hydronephrosis. Bladder is unremarkable. Stomach/Bowel: Gastric band is in place at the level of the gastroesophageal junction. The stomach is dilated with air-fluid levels. Mid and proximal small bowel loops are dilated with air-fluid levels measuring up to 4 cm. Definitive transition point is not visualized, but is likely somewhere within the lower abdomen as there are decompressed pelvic small bowel loops. The colon is nondilated. The appendix is likely surgically absent. There is no pneumatosis or free air. No focal wall thickening or inflammation identified. Additionally, there is fluid within the distal esophagus and distal esophageal wall thickening. Vascular/Lymphatic: No significant vascular findings are present. No enlarged abdominal or pelvic lymph nodes. Reproductive: Status post hysterectomy. No adnexal masses. Other: Again seen is an umbilical hernia containing small bowel. There is no ascites or free air. Musculoskeletal: No acute or  significant osseous findings. IMPRESSION: 1. Findings compatible with distal small-bowel obstruction. Exact point of transition not visualized, likely an pelvis. No free air. 2. Gastric band in place. 3. Distal esophageal wall thickening concerning for esophagitis. There is fluid reflux into the distal esophagus. 4. Umbilical hernia contains small bowel. This is not transition site for bowel obstruction. Electronically Signed   By: Ronney Asters M.D.   On: 07/26/2021 18:55  ? ?DG Abdomen Acute W/Chest ? ?Result Date: 07/26/2021 ?CLINICAL DATA:  Abdominal pain. Nausea since 1 o'clock this morning. EXAM: DG ABDOMEN ACUTE WITH 1 VIEW CHEST COMPARISON:  CT of 08/02/2020. FINDINGS: Frontal view of the chest demonstrates overlying wires and leads. Midline trachea. Normal heart size. No pleural effusion or pneumothorax. Clear lungs. Mild left hemidiaphragm elevation. Upright and supine views of the abdomen demonstrate gaseous distension of the stomach and small  bowel loops. Air-fluid levels throughout the stomach and abdominal bowel loops. Gaseous distension of small bowel up to 4.7 cm in the left upper pelvis. Gastric band appropriately positioned. IMPRESSION: G

## 2021-07-26 NOTE — Telephone Encounter (Signed)
Call from patient very upset and crying.  States has had a lot of Diarrhea.  Fells her Potassium is low and might need some IV fluids.  Feels really bad.  Patient was advised along with person with her to get her to the ER as soon as possible as to go ahead and call an ambulance for transport .  Patient was advised not to worry about her appearance but to get to the ER as soon as possible  ?

## 2021-07-27 ENCOUNTER — Inpatient Hospital Stay (HOSPITAL_COMMUNITY): Payer: Commercial Managed Care - PPO

## 2021-07-27 DIAGNOSIS — R197 Diarrhea, unspecified: Secondary | ICD-10-CM | POA: Diagnosis not present

## 2021-07-27 DIAGNOSIS — R202 Paresthesia of skin: Secondary | ICD-10-CM

## 2021-07-27 DIAGNOSIS — E876 Hypokalemia: Secondary | ICD-10-CM

## 2021-07-27 DIAGNOSIS — K56609 Unspecified intestinal obstruction, unspecified as to partial versus complete obstruction: Secondary | ICD-10-CM | POA: Diagnosis not present

## 2021-07-27 DIAGNOSIS — N179 Acute kidney failure, unspecified: Secondary | ICD-10-CM

## 2021-07-27 DIAGNOSIS — K429 Umbilical hernia without obstruction or gangrene: Secondary | ICD-10-CM

## 2021-07-27 DIAGNOSIS — R200 Anesthesia of skin: Secondary | ICD-10-CM

## 2021-07-27 LAB — GLUCOSE, CAPILLARY
Glucose-Capillary: 125 mg/dL — ABNORMAL HIGH (ref 70–99)
Glucose-Capillary: 109 mg/dL — ABNORMAL HIGH (ref 70–99)
Glucose-Capillary: 110 mg/dL — ABNORMAL HIGH (ref 70–99)

## 2021-07-27 LAB — COMPREHENSIVE METABOLIC PANEL
ALT: 10 U/L (ref 0–44)
AST: 15 U/L (ref 15–41)
Albumin: 3.9 g/dL (ref 3.5–5.0)
Alkaline Phosphatase: 62 U/L (ref 38–126)
Anion gap: 9 (ref 5–15)
BUN: 23 mg/dL — ABNORMAL HIGH (ref 6–20)
CO2: 26 mmol/L (ref 22–32)
Calcium: 8.7 mg/dL — ABNORMAL LOW (ref 8.9–10.3)
Chloride: 106 mmol/L (ref 98–111)
Creatinine, Ser: 0.79 mg/dL (ref 0.44–1.00)
GFR, Estimated: >60 mL/min (ref >60)
Glucose, Bld: 138 mg/dL — ABNORMAL HIGH (ref 70–99)
Potassium: 3.6 mmol/L (ref 3.5–5.1)
Sodium: 141 mmol/L (ref 135–145)
Total Bilirubin: 0.4 mg/dL (ref 0.3–1.2)
Total Protein: 7.4 g/dL (ref 6.5–8.1)

## 2021-07-27 LAB — URINALYSIS, ROUTINE W REFLEX MICROSCOPIC
Bilirubin Urine: NEGATIVE
Glucose, UA: NEGATIVE mg/dL
Hgb urine dipstick: NEGATIVE
Ketones, ur: NEGATIVE mg/dL
Leukocytes,Ua: NEGATIVE
Nitrite: NEGATIVE
Protein, ur: 30 mg/dL — AB
Specific Gravity, Urine: 1.015 (ref 1.005–1.030)
pH: 5 (ref 5.0–8.0)

## 2021-07-27 LAB — CBC
HCT: 38.8 % (ref 36.0–46.0)
Hemoglobin: 13 g/dL (ref 12.0–15.0)
MCH: 28.8 pg (ref 26.0–34.0)
MCHC: 33.5 g/dL (ref 30.0–36.0)
MCV: 85.8 fL (ref 80.0–100.0)
Platelets: 418 10*3/uL — ABNORMAL HIGH (ref 150–400)
RBC: 4.52 MIL/uL (ref 3.87–5.11)
RDW: 14.2 % (ref 11.5–15.5)
WBC: 13.8 10*3/uL — ABNORMAL HIGH (ref 4.0–10.5)
nRBC: 0 % (ref 0.0–0.2)

## 2021-07-27 LAB — HIV ANTIBODY (ROUTINE TESTING W REFLEX): HIV Screen 4th Generation wRfx: NONREACTIVE

## 2021-07-27 MED ORDER — LORAZEPAM 2 MG/ML IJ SOLN
0.5000 mg | Freq: Once | INTRAMUSCULAR | Status: DC | PRN
  Filled 2021-07-27: qty 1

## 2021-07-27 MED ORDER — PHENOL 1.4 % MT LIQD
1.0000 | OROMUCOSAL | Status: DC | PRN
  Administered 2021-07-27: 1 via OROMUCOSAL
  Filled 2021-07-27: qty 177

## 2021-07-27 MED ORDER — LORAZEPAM 2 MG/ML IJ SOLN
0.5000 mg | Freq: Once | INTRAMUSCULAR | Status: AC | PRN
  Administered 2021-07-27: 1 mg via INTRAVENOUS
  Filled 2021-07-27: qty 1

## 2021-07-27 MED ORDER — DIATRIZOATE MEGLUMINE & SODIUM 66-10 % PO SOLN
90.0000 mL | Freq: Once | ORAL | Status: AC
  Administered 2021-07-27: 90 mL via NASOGASTRIC
  Filled 2021-07-27: qty 90

## 2021-07-27 NOTE — Assessment & Plan Note (Addendum)
Patient takes potassium supplementation as an outpatient. Potassium of 3.2 on BMP this morning. NPO at this time. Patient did not tolerate IV potassium and was switched to potassium containing IV fluids. Potassium improved to 4.6 on BMP this morning with repletion. ?-BMP daily ?

## 2021-07-27 NOTE — Assessment & Plan Note (Addendum)
Bilateral extremities. No neck pain. No associated weakness. Intermittent. Normal vitamin B12. ?

## 2021-07-27 NOTE — Progress Notes (Signed)
Last X ray shows consistent kinking and coiling of NG tube around esophagus, patient was instructed that tubing needs to be change this time and agreed to it, NG tube was successfully removed, patient nose starts bleeding and was instructed to put pressure,patient verbalized holding on another NG insertion this time and decided that she will talk to doctors this morning. Emphasized the importance of the treatment to the patient, patient shows understanding.We will continue to monitor. ?

## 2021-07-27 NOTE — Progress Notes (Signed)
Initial Nutrition Assessment ? ?INTERVENTION:  ? ?-Will continue to monitor for diet advancement ? ?Once diet advanced ?-Ensure MAX Protein po BID, each supplement provides 150 kcal and 30 grams of protein  ?-Multivitamin with minerals daily ? ?NUTRITION DIAGNOSIS:  ? ?Inadequate oral intake related to acute illness (SBO) as evidenced by NPO status. ? ?GOAL:  ? ?Patient will meet greater than or equal to 90% of their needs ? ?MONITOR:  ? ?Labs, Weight trends, I & O's, Diet advancement ? ?REASON FOR ASSESSMENT:  ? ?Malnutrition Screening Tool ?  ? ?ASSESSMENT:  ? ?51 y.o. female with history of prior small bowel obstruction requiring exploratory laparotomy and lysis of adhesions in 2017 presents to the ER with complaints of abdominal pain nausea vomiting since 1 AM this morning. ? ?Patient in room, states she was given something in anticipation of IR placing a NGT.  ? ?Pt states she has had a rough morning d/t issues with NGT placement and having a  nose bleed once it was removed. She also had fluid removed from her gastric band (6.62m). ?Pt states she was eating normally with no issues until Wednesday night she ate some crab legs and had multiple episodes of diarrhea. States she has had a SBO before in 2017, states that was worse than her current situation. Pt continues to take a daily MVI, potassium and hair skin and nails supplements at home. Reports numbness and tingling in her BLEs but reports this has been d/t her potassium levels.   ? ?Will add protein supplements as well as daily MVI once diet is able to be advanced. Will monitor. ? ?Medications: Zofran ? ?Labs reviewed: ?CBGs: 109-125 ?  ? ?NUTRITION - FOCUSED PHYSICAL EXAM: ? ?No depletions noted. ? ?Diet Order:   ?Diet Order   ? ?       ?  Diet NPO time specified  Diet effective now       ?  ? ?  ?  ? ?  ? ? ?EDUCATION NEEDS:  ? ?No education needs have been identified at this time ? ?Skin:  Skin Assessment: Reviewed RN Assessment ? ?Last BM:   3/16 ? ?Height:  ? ?Ht Readings from Last 1 Encounters:  ?07/26/21 '5\' 3"'$  (1.6 m)  ? ? ?Weight:  ? ?Wt Readings from Last 1 Encounters:  ?07/26/21 75.8 kg  ? ? ?BMI:  Body mass index is 29.6 kg/m?. ? ?Estimated Nutritional Needs:  ? ?Kcal:  1800-2000 ? ?Protein:  80-95g ? ?Fluid:  2L/day ? ?LClayton Bibles MS, RD, LDN ?Inpatient Clinical Dietitian ?Contact information available via Amion ? ?

## 2021-07-27 NOTE — Progress Notes (Signed)
X ray results shows that NG tube is still kink and that further repositioning is recommended, informed the patient about the need to further adjust the NG tube, patient was upset and verbalized about how hard and painful having the NG tube patient was educated about the importance of the treatment, patient agreed with another adjustment.We pulled back longer this time and tube was put back in, ordered another X ray for confirmation we will continue to monitor. ?

## 2021-07-27 NOTE — Assessment & Plan Note (Addendum)
Infrequent watery stools of small amount. ?

## 2021-07-27 NOTE — Progress Notes (Signed)
IR came to take pt down for NGT placement at 1200 after ativan given. Pt stated to IR she did not want to go down right now, she was having a lot of indigestion and it got worse right when they came to get her. Pt offered maalox, but refused. EKG done, MD notified. Will try again at 2pm.  ?

## 2021-07-27 NOTE — Progress Notes (Signed)
Patient NG tube X ray shows that the tube is kink and repositioning is recommended. Discussed to the patient about the plan  requested to be medicated with pain and nausea medicine before repositioning the tube. Paged on call provider for nausea medicine. Order received and patient was medicated, NG tube was then pulled back and push back in, X ray was ordered for placement confirmation. ?

## 2021-07-27 NOTE — Progress Notes (Signed)
Transition of Care (TOC) Screening Note ? ?Patient Details  ?Name: Meagan Dean ?Date of Birth: 1970-05-20 ? ?Transition of Care (TOC) CM/SW Contact:    ?Sherie Don, LCSW ?Phone Number: ?07/27/2021, 10:55 AM ? ?Transition of Care Department [facility]) has reviewed patient and no TOC needs have been identified at this time. We will continue to monitor patient advancement through interdisciplinary progression rounds. If new patient transition needs arise, please place a TOC consult. ?

## 2021-07-27 NOTE — Consult Note (Addendum)
? ? ? ?Meagan Dean ?[birth year]  ?[id number].   ? ?Requesting MD: Dr. Cordelia Poche ?Chief Complaint/Reason for Consult: SBO ? ?HPI: Meagan Dean is a 51 y.o. female with a hx of multiple prior abdominal surgeries as noted below who presented with abdominal pain, n/v/d.  Patient reports that yesterday morning she began having a rumbling/pressure/pain of her lower abdomen.  The pain was constant and gradually increased.  Notes associated associated nausea as well as greater than 5 episodes of diarrhea.  Denies any sick contacts or persons with similar symptoms. This felt similar to when she had an SBO in 2017 so she presented to the ED for evaluation.  CT showed distal small bowel obstruction without exact point visualized but likely in pelvis.  No free air.  Patient does have an umbilical hernia that contains small bowel but this is not the point of obstruction. She also has a gastric band.  She reports there is fluid in the band but she is not sure exactly how much as it has been several years since her last band adjustment.  Reports she was 250lbs before her gastric band procedure and has been able to keep off the weight with current band fill amount. Patient was admitted and an NGT was placed but this kinked in the esophagus. She notes an episode of emesis when they were manipulating the tube but this has been the only episode of emesis since symptom onset. The NGT has since been removed.  Today she has continued lower abdominal pain and pressure with associated nausea. She is passing flatus. No bm or diarrhea since yesterday afternoon.  ? ?Past Medical Hx; HTN, GERD,  ?Past Surgical Hx: Laparoscopic Gastric Banding by Dr. Excell Seltzer (2010); Laparoscopic appendectomy for perforated appendicitis by Dr. Zella Richer in 2016; exploratory laparotomy with loa for sbo by Dr. Grandville Silos in 2017 and abdominal hysterectomy with salpingectomy and LOA by OBGYN (2019) ? ?ROS: ?Review of Systems  ?Constitutional:  Positive for  diaphoresis.  ?Gastrointestinal:  Positive for abdominal pain, diarrhea, nausea and vomiting.  ?All other systems reviewed and are negative. ? ?Family History  ?Problem Relation Age of Onset  ? Cancer Maternal Grandmother   ?     colon/liver  ? Stroke Father   ? ? ?Past Medical History:  ?Diagnosis Date  ? Anemia   ? ASCUS with positive high risk HPV cervical 03/15/2017  ? Asthma due to seasonal allergies   ? MILD-- JUST GOT INHALER-not used yet -allergy induced  ? Calculus of ureter 06/16/2013  ? Endometriosis of pelvis   ? Fibroids 09/02/2017  ? GERD (gastroesophageal reflux disease)   ? diet controlled  ? H/O hiatal hernia   ? Headache   ? MIGRAINES - none since weight loss surgery  ? History of endometriosis 03/28/2017  ? History of hypertension   ? NO ISSUES SINCE WT LOSS AFTER GASTRIC BANDING  ? History of kidney stones   ? surgery to remove  ? History of obstructive sleep apnea   ? DX'D PRIOR TO GASTRIC BANDING  2010 --  NO ISSUES SINCE WT LOSS  ? Hypertension 03/28/2017  ? Low potassium syndrome   ? hopsitalized for 3 days  ? Right ureteral stone   ? Seasonal allergies   ? Sinus headache   ? Sleep apnea   ? prior to weight loss surgery, does not have cpap  ? Urgency of urination   ? ? ?Past Surgical History:  ?Procedure Laterality Date  ? ABDOMINAL HYSTERECTOMY    ?  APPENDECTOMY    ? bowel obstruction  2017  ? CYSTOSCOPY WITH RETROGRADE PYELOGRAM, URETEROSCOPY AND STENT PLACEMENT Right 06/18/2013  ? Procedure: CYSTOSCOPY WITH RETROGRADE PYELOGRAM, URETEROSCOPY AND STENT PLACEMENT;  Surgeon: Franchot Gallo, MD;  Location: [facility];  Service: Urology;  Laterality: Right;  ? CYSTOSCOPY WITH RETROGRADE PYELOGRAM, URETEROSCOPY AND STENT PLACEMENT Right 07/05/2013  ? Procedure: CYSTOSCOPY , URETEROSCOPY ANDstone extraction;  Surgeon: Franchot Gallo, MD;  Location: [facility];  Service: Urology;  Laterality: Right;  ? EXPLORATORY LAPARTOMY/  MYOMECTOMY  2005  ? HYSTERECTOMY ABDOMINAL WITH SALPINGECTOMY  Bilateral 09/02/2017  ? Procedure: HYSTERECTOMY ABDOMINAL WITH SALPINGECTOMY;  Surgeon: Lavonia Drafts, MD;  Location: [facility];  Service: Gynecology;  Laterality: Bilateral;  ? LAPAROSCOPIC APPENDECTOMY N/A 07/28/2014  ? Procedure: APPENDECTOMY LAPAROSCOPIC;  Surgeon: Jackolyn Confer, MD;  Location: [facility];  Service: General;  Laterality: N/A;  ? LAPAROSCOPIC GASTRIC BANDING  06-06-2008  ? LAPAROSCOPY /  OVARIAN CYSTECTOMY/  LASER ABLATION ENDOMETRIOSIS  2003  ? LAPAROTOMY N/A 11/24/2015  ? Procedure: EXPLORATORY LAPAROTOMY;  Surgeon: Georganna Skeans, MD;  Location: [place];  Service: General;  Laterality: N/A;  ? LYSIS OF ADHESION N/A 11/24/2015  ? Procedure: LYSIS OF ADHESION;  Surgeon: Georganna Skeans, MD;  Location: [place];  Service: General;  Laterality: N/A;  ? LYSIS OF ADHESION N/A 09/02/2017  ? Procedure: LYSIS OF ADHESION;  Surgeon: Lavonia Drafts, MD;  Location: [facility];  Service: Gynecology;  Laterality: N/A;  ? wisdom teeth ext    ? ? ?Social History:  reports that she has never smoked. She has never used smokeless tobacco. She reports current alcohol use. She reports that she does not use drugs. ? ?Allergies:  ?Allergies  ?Allergen Reactions  ? Latex Itching  ? Vicodin [Hydrocodone-Acetaminophen] Other (See Comments)  ?  Made her feel "high" ? ?Has had dilaudid before and tolerated it well  ? ? ?Medications Prior to Admission  ?Medication Sig Dispense Refill  ? fluticasone furoate-vilanterol (BREO ELLIPTA) 100-25 MCG/INH AEPB Inhale 1 puff into the lungs daily. (Patient taking differently: Inhale 1 puff into the lungs daily as needed (wheezing).)    ? loratadine (CLARITIN) 10 MG tablet Take 10 mg by mouth daily.    ? potassium chloride SA (KLOR-CON M) 20 MEQ tablet Take 1 tablet (20 mEq total) by mouth daily. 30 tablet 0  ? pseudoephedrine (SUDAFED) 120 MG 12 hr tablet Take 120 mg by mouth every 12 (twelve) hours as needed for congestion.    ? sodium chloride (OCEAN) 0.65 % SOLN nasal spray Place  1 spray into both nostrils as needed for congestion.    ? ? ? ?Physical Exam: ?Blood pressure (!) 130/99, pulse 99, temperature 98.9 ?F (37.2 ?C), temperature source Oral, resp. rate 17, height '5\' 3"'$  (1.6 m), weight 75.8 kg, SpO2 99 %. ?General: pleasant, WD/WN AA female who is laying in bed in NAD ?HEENT: head is normocephalic, atraumatic.  Sclera are noninjected.  PERRL.  Ears and nose without any masses or lesions.  Mouth is pink and moist. Dentition fair ?Heart: regular, rate, and rhythm.  Normal s1,s2. No obvious murmurs, gallops, or rubs noted.  Palpable pedal pulses bilaterally  ?Lungs: CTAB, no wheezes, rhonchi, or rales noted.  Respiratory effort nonlabored ?Abd:  Soft, mild distension, central abdominal tenderness without peritonitis. Slightly hypoactive BS. Umbilical hernia is already reduced and NT. Palpable port in right hemiadomen. No masses or organomegaly ?MS: no BUE/BLE edema, calves soft and nontender ?Skin: warm and dry with no masses,  lesions, or rashes ?Psych: A&Ox4 with an appropriate affect ?Neuro: cranial nerves grossly intact, equal strength in BUE/BLE bilaterally, normal speech, thought process intact, moves all extremities, gait not assessed ? ?Results for orders placed or performed during the hospital encounter of 07/26/21 (from the past 48 hour(s))  ?Lipase, blood     Status: None  ? Collection Time: 07/26/21  4:15 PM  ?Result Value Ref Range  ? Lipase 44 11 - 51 U/L  ?  Comment: Performed at [facility], [place] [address] [place], [place] [address]  ?Comprehensive metabolic panel     Status: Abnormal  ? Collection Time: 07/26/21  4:15 PM  ?Result Value Ref Range  ? Sodium 138 135 - 145 mmol/L  ? Potassium 3.6 3.5 - 5.1 mmol/L  ? Chloride 103 98 - 111 mmol/L  ? CO2 25 22 - 32 mmol/L  ? Glucose, Bld 125 (H) 70 - 99 mg/dL  ?  Comment: Glucose reference range applies only to samples taken after fasting for at least 8 hours.  ? BUN 26 (H) 6 - 20 mg/dL  ? Creatinine, Ser  1.07 (H) 0.44 - 1.00 mg/dL  ? Calcium 9.9 8.9 - 10.3 mg/dL  ? Total Protein 9.2 (H) 6.5 - 8.1 g/dL  ? Albumin 4.7 3.5 - 5.0 g/dL  ? AST 21 15 - 41 U/L  ? ALT 13 0 - 44 U/L  ? Alkaline Phosphatase 80 38 - 1

## 2021-07-27 NOTE — Hospital Course (Addendum)
Meagan Dean is a 51 y.o. female with a history of small bowel obstruction s/p exploratory laparotomy/lysis of adhesions, hypertension, hyponatremia. Patient presented secondary to abdominal pain and was found to have evidence of small bowel obstruction on CT scan. NG tube placed and general surgery consulted. Diet advanced and NG tube removed on 3/20. ?

## 2021-07-27 NOTE — Assessment & Plan Note (Signed)
Reducible and not the site of transition on CT scan. ?

## 2021-07-27 NOTE — Assessment & Plan Note (Addendum)
Mild. Resolved with IV fluids. 

## 2021-07-27 NOTE — Progress Notes (Signed)
? ?PROGRESS NOTE ? ? ? ?Meagan Dean  OIN:[id number] DOB: Jul 02, 1970 DOA: 07/26/2021 ?PCP: Harvie Heck, MD ? ? ?Brief Narrative: ?Meagan Dean is a 51 y.o. female with a history of small bowel obstruction s/p exploratory laparotomy/lysis of adhesions, hypertension, hyponatremia. Patient presented secondary to abdominal pain and was found to have evidence of small bowel obstruction on CT scan. NG tube placed and general surgery consulted. ? ? ?Assessment and Plan: ?SBO (small bowel obstruction) ([place]) ?Patient with a history of bowel obstruction. CT imaging suggests distal small bowel obstruction without exact transition point. NG tube placed and general surgery consulted. NG tube removed overnight secondary to improper positioning. ?-NPO ?-General surgery recommendations: NG tube decompression ?-Continue IV fluids ? ?Diarrhea ?Unsure of significance at this time. ?-Observe ? ?Hypokalemia ?Patient takes potassium supplementation as an outpatient. Currently normal. NPO at this time. ?-Resume home potassium supplementation once able to take PO ? ?AKI (acute kidney injury) (HCC)-resolved as of 07/27/2021 ?Mild. Resolved with IV fluids. ? ?Umbilical hernia ?Reducible and not the site of transition on CT scan. ? ?Numbness and tingling ?Bilateral extremities. No neck pain. No associated weakness. Intermittent. ?-Check Vitamin B12 ? ? ? ?DVT prophylaxis: SCDs ?Code Status:   Code Status: Full Code ?Family Communication: None at bedside ?Disposition Plan: Discharge likely home pending resolution of bowel obstruction ? ? ?Consultants:  ?General surgery ? ?Procedures:  ?NG tube ? ?Antimicrobials: ?None  ? ? ?Subjective: ?Patient reports multiple episodes of epistaxis with attempts at NG tube placement. Continues to have abdominal fullness and discomfort. Some nausea but no emesis since yesterday. ? ?Objective: ?BP (!) 130/99 (BP Location: Right Arm)   Pulse 99   Temp 98.9 ?F (37.2 ?C) (Oral)   Resp 17   Ht '5\' 3"'$  (1.6 m)    Wt 75.8 kg   LMP  (LMP Unknown) Comment: 07/2016  SpO2 99%   BMI 29.60 kg/m?  ? ?Examination: ? ?General exam: Appears calm and comfortable ?Respiratory system: Clear to auscultation. Respiratory effort normal. ?Cardiovascular system: S1 & S2 heard, RRR. No murmurs, rubs, gallops or clicks. ?Gastrointestinal system: Abdomen is mildly distended, soft and mild-moderately tender. No organomegaly or masses felt. Decreased bowel sounds heard. ?Central nervous system: Alert and oriented. No focal neurological deficits. ?Musculoskeletal: No edema. No calf tenderness ?Skin: No cyanosis. No rashes ?Psychiatry: Judgement and insight appear normal. Mood & affect appropriate.  ? ? ?Data Reviewed: I have personally reviewed following labs and imaging studies ? ?CBC ?Lab Results  ?Component Value Date  ? WBC 13.8 (H) 07/27/2021  ? RBC 4.52 07/27/2021  ? HGB 13.0 07/27/2021  ? HCT 38.8 07/27/2021  ? MCV 85.8 07/27/2021  ? MCH 28.8 07/27/2021  ? PLT 418 (H) 07/27/2021  ? MCHC 33.5 07/27/2021  ? RDW 14.2 07/27/2021  ? LYMPHSABS 0.7 07/26/2021  ? MONOABS 0.3 07/26/2021  ? EOSABS 0.0 07/26/2021  ? BASOSABS 0.0 07/26/2021  ? ? ? ?Last metabolic panel ?Lab Results  ?Component Value Date  ? NA 141 07/27/2021  ? K 3.6 07/27/2021  ? CL 106 07/27/2021  ? CO2 26 07/27/2021  ? BUN 23 (H) 07/27/2021  ? CREATININE 0.79 07/27/2021  ? GLUCOSE 138 (H) 07/27/2021  ? GFRNONAA >60 07/27/2021  ? GFRAA >60 08/16/2019  ? CALCIUM 8.7 (L) 07/27/2021  ? PHOS 2.6 11/29/2015  ? PROT 7.4 07/27/2021  ? ALBUMIN 3.9 07/27/2021  ? LABGLOB 3.1 06/27/2015  ? AGRATIO 1.3 06/27/2015  ? BILITOT 0.4 07/27/2021  ? ALKPHOS 62 07/27/2021  ?  AST 15 07/27/2021  ? ALT 10 07/27/2021  ? ANIONGAP 9 07/27/2021  ? ? ?GFR: ?Estimated Creatinine Clearance: 82.1 mL/min (by C-G formula based on SCr of 0.79 mg/dL). ? ?Recent Results (from the past 240 hour(s))  ?Resp Panel by RT-PCR (Flu A&B, Covid) Nasopharyngeal Swab     Status: None  ? Collection Time: 07/26/21  7:09 PM  ?  Specimen: Nasopharyngeal Swab; Nasopharyngeal(NP) swabs in vial transport medium  ?Result Value Ref Range Status  ? SARS Coronavirus 2 by RT PCR NEGATIVE NEGATIVE Final  ?  Comment: (NOTE) ?SARS-CoV-2 target nucleic acids are NOT DETECTED. ? ?The SARS-CoV-2 RNA is generally detectable in upper respiratory ?specimens during the acute phase of infection. The lowest ?concentration of SARS-CoV-2 viral copies this assay can detect is ?138 copies/mL. A negative result does not preclude SARS-Cov-2 ?infection and should not be used as the sole basis for treatment or ?other patient management decisions. A negative result may occur with  ?improper specimen collection/handling, submission of specimen other ?than nasopharyngeal swab, presence of viral mutation(s) within the ?areas targeted by this assay, and inadequate number of viral ?copies(<138 copies/mL). A negative result must be combined with ?clinical observations, patient history, and epidemiological ?information. The expected result is Negative. ? ?Fact Sheet for Patients:  ?EntrepreneurPulse.com.au ? ?Fact Sheet for Healthcare Providers:  ?IncredibleEmployment.be ? ?This test is no t yet approved or cleared by the [place] FDA and  ?has been authorized for detection and/or diagnosis of SARS-CoV-2 by ?FDA under an Emergency Use Authorization (EUA). This EUA will remain  ?in effect (meaning this test can be used) for the duration of the ?COVID-19 declaration under Section 564(b)(1) of the Act, 21 ?U.S.C.section 360bbb-3(b)(1), unless the authorization is terminated  ?or revoked sooner.  ? ? ?  ? Influenza A by PCR NEGATIVE NEGATIVE Final  ? Influenza B by PCR NEGATIVE NEGATIVE Final  ?  Comment: (NOTE) ?The Xpert Xpress SARS-CoV-2/FLU/RSV plus assay is intended as an aid ?in the diagnosis of influenza from Nasopharyngeal swab specimens and ?should not be used as a sole basis for treatment. Nasal washings and ?aspirates are  unacceptable for Xpert Xpress SARS-CoV-2/FLU/RSV ?testing. ? ?Fact Sheet for Patients: ?EntrepreneurPulse.com.au ? ?Fact Sheet for Healthcare Providers: ?IncredibleEmployment.be ? ?This test is not yet approved or cleared by the [place] FDA and ?has been authorized for detection and/or diagnosis of SARS-CoV-2 by ?FDA under an Emergency Use Authorization (EUA). This EUA will remain ?in effect (meaning this test can be used) for the duration of the ?COVID-19 declaration under Section 564(b)(1) of the Act, 21 U.S.C. ?section 360bbb-3(b)(1), unless the authorization is terminated or ?revoked. ? ?Performed at [facility], [address] ?[place], [place] [address] ?  ?  ? ? ?Radiology Studies: ?DG Abd 1 View ? ?Result Date: 07/27/2021 ?CLINICAL DATA:  NG tube reposition. EXAM: ABDOMEN - 1 VIEW COMPARISON:  07/26/2021. FINDINGS: There is gaseous distention of the stomach. The small bowel is excluded from the field of view. The enteric tube loops and kinks in the distal esophagus and terminates in the mid esophagus. IMPRESSION: Looping and kinking of the enteric tube in the distal esophagus. Repositioning is recommended. Electronically Signed   By: Brett Fairy M.D.   On: 07/27/2021 01:27  ? ?DG Abd 1 View ? ?Result Date: 07/26/2021 ?CLINICAL DATA:  NG tube placement. Tube was reposition from earlier placement. EXAM: ABDOMEN - 1 VIEW COMPARISON:  07/26/2021 at 8:31 p.m. FINDINGS: The enteric tube is again demonstrated coiled in  the chest, likely in the mid esophagus with the tip projecting at the level of the distal esophagus. The top loop of the tube is kinked. Repositioning is indicated. Gaseous distention of the stomach with gas-filled dilated upper abdominal small bowel and colon. A gastric lap band is present. Shallow inspiration. Lungs are clear. IMPRESSION: Enteric tube remains coiled in the level of the mid esophagus. Repositioning is indicated.  Electronically Signed   By: Lucienne Capers M.D.   On: 07/26/2021 23:35  ? ?DG Abdomen 1 View ? ?Result Date: 07/26/2021 ?CLINICAL DATA:  NG tube placement. EXAM: ABDOMEN - 1 VIEW COMPARISON:  07/26/2021. FINDINGS: Distended loop

## 2021-07-27 NOTE — Assessment & Plan Note (Addendum)
Patient with a history of bowel obstruction. CT imaging suggests distal small bowel obstruction without exact transition point. NG tube placed and general surgery consulted. NG tube removed overnight secondary to improper positioning. SBO protocol initiated with contrast seen in the colon on abdominal x-ray. ?-General surgery recommendations: NG tube removed; CLD. Pending today ?-Hold IV fluids ?

## 2021-07-28 ENCOUNTER — Inpatient Hospital Stay (HOSPITAL_COMMUNITY): Payer: Commercial Managed Care - PPO

## 2021-07-28 DIAGNOSIS — N179 Acute kidney failure, unspecified: Secondary | ICD-10-CM | POA: Diagnosis not present

## 2021-07-28 DIAGNOSIS — K56609 Unspecified intestinal obstruction, unspecified as to partial versus complete obstruction: Secondary | ICD-10-CM | POA: Diagnosis not present

## 2021-07-28 DIAGNOSIS — R197 Diarrhea, unspecified: Secondary | ICD-10-CM | POA: Diagnosis not present

## 2021-07-28 DIAGNOSIS — E876 Hypokalemia: Secondary | ICD-10-CM | POA: Diagnosis not present

## 2021-07-28 LAB — GLUCOSE, CAPILLARY
Glucose-Capillary: 94 mg/dL (ref 70–99)
Glucose-Capillary: 94 mg/dL (ref 70–99)
Glucose-Capillary: 90 mg/dL (ref 70–99)

## 2021-07-28 LAB — BASIC METABOLIC PANEL
Anion gap: 5 (ref 5–15)
BUN: 18 mg/dL (ref 6–20)
CO2: 28 mmol/L (ref 22–32)
Calcium: 8.3 mg/dL — ABNORMAL LOW (ref 8.9–10.3)
Chloride: 106 mmol/L (ref 98–111)
Creatinine, Ser: 0.91 mg/dL (ref 0.44–1.00)
GFR, Estimated: >60 mL/min (ref >60)
Glucose, Bld: 104 mg/dL — ABNORMAL HIGH (ref 70–99)
Potassium: 3.1 mmol/L — ABNORMAL LOW (ref 3.5–5.1)
Sodium: 139 mmol/L (ref 135–145)

## 2021-07-28 LAB — MAGNESIUM: Magnesium: 2 mg/dL (ref 1.7–2.4)

## 2021-07-28 LAB — VITAMIN B12: Vitamin B-12: 608 pg/mL (ref 180–914)

## 2021-07-28 MED ORDER — POTASSIUM CHLORIDE 2 MEQ/ML IV SOLN
INTRAVENOUS | Status: AC
  Filled 2021-07-28 (×2): qty 1000

## 2021-07-28 MED ORDER — DEXTROSE IN LACTATED RINGERS 5 % IV SOLN: INTRAVENOUS | Status: AC

## 2021-07-28 MED ORDER — POTASSIUM CHLORIDE 10 MEQ/100ML IV SOLN
10.0000 meq | INTRAVENOUS | Status: AC
  Administered 2021-07-28: 10 meq via INTRAVENOUS
  Filled 2021-07-28: qty 100

## 2021-07-28 NOTE — Progress Notes (Signed)
? ? ?Assessment & Plan: ?HD#3 - small bowel obstruction ? NG placed yesterday by radiology ? AXR this AM with contrast to colon, non-obstructive small bowel pattern ? Moderate output from NG ? Passing flatus, no BM's except small liquid ? Encouraged ambulation, OOB ?  ?Given difficulty placing NG, would keep NG in place and NPO with ice chips today.  Encouraged OOB, ambulation.  If improved in AM 3/19, will remove NG and begin clear liquid diet. ? ?Hypokalemia ? Replete per medical service ? ?      Meagan Gemma, MD ?      [facility] ?      Office: [phone] ? ? ?Chief Complaint: ?Small bowel obstruction ? ?Subjective: ?Patient in bed, comfortable, mild upper abdominal pain, passing flatus.  NG, NPO. ? ?Objective: ?Vital signs in last 24 hours: ?Temp:  [98.6 ?F (37 ?C)-98.8 ?F (37.1 ?C)] 98.8 ?F (37.1 ?C) (03/18 0547) ?Pulse Rate:  [86-98] 86 (03/18 0547) ?Resp:  [15-18] 16 (03/18 0547) ?BP: (100-113)/(64-85) 100/64 (03/18 0547) ?SpO2:  [97 %-100 %] 97 % (03/18 0547) ?Last BM Date : 07/26/21 (multiple diarrhea episodes at home) ? ?Intake/Output from previous day: ?03/17 0701 - 03/18 0700 ?In: 0  ?Out: 250 [Urine:250] ?Intake/Output this shift: ?No intake/output data recorded. ? ?Physical Exam: ?HEENT - sclerae clear, mucous membranes moist ?Neck - soft ?Abdomen - soft, mild distension; BS present; mild upper abdominal tenderness ?Ext - no edema, non-tender ?Neuro - alert & oriented, no focal deficits ? ?Lab Results:  ?Recent Labs  ?  07/26/21 ?1615 07/27/21 ?0409  ?WBC 13.6* 13.8*  ?HGB 14.5 13.0  ?HCT 44.3 38.8  ?PLT 467* 418*  ? ?BMET ?Recent Labs  ?  07/27/21 ?0409 07/28/21 ?0355  ?NA 141 139  ?K 3.6 3.1*  ?CL 106 106  ?CO2 26 28  ?GLUCOSE 138* 104*  ?BUN 23* 18  ?CREATININE 0.79 0.91  ?CALCIUM 8.7* 8.3*  ? ?PT/INR ?No results for input(s): LABPROT, INR in the last 72 hours. ?Comprehensive Metabolic Panel: ?   ?Component Value Date/Time  ? NA 139 07/28/2021 0355  ? NA 141 07/27/2021 0409  ?  NA 138 07/10/2021 0900  ? NA 144 08/12/2019 1538  ? K 3.1 (L) 07/28/2021 0355  ? K 3.6 07/27/2021 0409  ? CL 106 07/28/2021 0355  ? CL 106 07/27/2021 0409  ? CO2 28 07/28/2021 0355  ? CO2 26 07/27/2021 0409  ? BUN 18 07/28/2021 0355  ? BUN 23 (H) 07/27/2021 0409  ? BUN 15 07/10/2021 0900  ? BUN 14 08/12/2019 1538  ? CREATININE 0.91 07/28/2021 0355  ? CREATININE 0.79 07/27/2021 0409  ? CREATININE 0.75 07/03/2013 1003  ? GLUCOSE 104 (H) 07/28/2021 0355  ? GLUCOSE 138 (H) 07/27/2021 0409  ? CALCIUM 8.3 (L) 07/28/2021 0355  ? CALCIUM 8.7 (L) 07/27/2021 0409  ? AST 15 07/27/2021 0409  ? AST 21 07/26/2021 1615  ? ALT 10 07/27/2021 0409  ? ALT 13 07/26/2021 1615  ? ALKPHOS 62 07/27/2021 0409  ? ALKPHOS 80 07/26/2021 1615  ? BILITOT 0.4 07/27/2021 0409  ? BILITOT 0.5 07/26/2021 1615  ? BILITOT 0.3 06/27/2015 1631  ? PROT 7.4 07/27/2021 0409  ? PROT 9.2 (H) 07/26/2021 1615  ? PROT 7.2 06/27/2015 1631  ? ALBUMIN 3.9 07/27/2021 0409  ? ALBUMIN 4.7 07/26/2021 1615  ? ALBUMIN 4.1 06/27/2015 1631  ? ? ?Studies/Results: ?DG Abd 1 View ? ?Result Date: 07/27/2021 ?CLINICAL DATA:  NG tube reposition. EXAM: ABDOMEN - 1 VIEW  COMPARISON:  07/26/2021. FINDINGS: There is gaseous distention of the stomach. The small bowel is excluded from the field of view. The enteric tube loops and kinks in the distal esophagus and terminates in the mid esophagus. IMPRESSION: Looping and kinking of the enteric tube in the distal esophagus. Repositioning is recommended. Electronically Signed   By: Brett Fairy M.D.   On: 07/27/2021 01:27  ? ?DG Abd 1 View ? ?Result Date: 07/26/2021 ?CLINICAL DATA:  NG tube placement. Tube was reposition from earlier placement. EXAM: ABDOMEN - 1 VIEW COMPARISON:  07/26/2021 at 8:31 p.m. FINDINGS: The enteric tube is again demonstrated coiled in the chest, likely in the mid esophagus with the tip projecting at the level of the distal esophagus. The top loop of the tube is kinked. Repositioning is indicated. Gaseous  distention of the stomach with gas-filled dilated upper abdominal small bowel and colon. A gastric lap band is present. Shallow inspiration. Lungs are clear. IMPRESSION: Enteric tube remains coiled in the level of the mid esophagus. Repositioning is indicated. Electronically Signed   By: Lucienne Capers M.D.   On: 07/26/2021 23:35  ? ?DG Abdomen 1 View ? ?Result Date: 07/26/2021 ?CLINICAL DATA:  NG tube placement. EXAM: ABDOMEN - 1 VIEW COMPARISON:  07/26/2021. FINDINGS: Distended loops of small bowel are noted in the left upper quadrant measuring up to 4 cm in diameter. No free air. An enteric tube loops and kinks in the mid esophagus and terminates in the distal esophagus. Repositioning is recommended. A gastric lap band is noted. No radiopaque calculi. IMPRESSION: 1. Distended loops of small bowel in the left upper quadrant, compatible with small-bowel obstruction. 2. There is looping and kinking of the enteric tube in the mid esophagus and the tube terminates in the distal esophagus. Repositioning is recommended. Electronically Signed   By: Brett Fairy M.D.   On: 07/26/2021 20:44  ? ?CT Abdomen Pelvis W Contrast ? ?Result Date: 07/26/2021 ?CLINICAL DATA:  Nausea and vomiting, evaluate for bowel obstruction. EXAM: CT ABDOMEN AND PELVIS WITH CONTRAST TECHNIQUE: Multidetector CT imaging of the abdomen and pelvis was performed using the standard protocol following bolus administration of intravenous contrast. RADIATION DOSE REDUCTION: This exam was performed according to the departmental dose-optimization program which includes automated exposure control, adjustment of the mA and/or kV according to patient size and/or use of iterative reconstruction technique. CONTRAST:  125m OMNIPAQUE IOHEXOL 300 MG/ML  SOLN COMPARISON:  CT abdomen and pelvis 08/02/2020 FINDINGS: Lower chest: No acute abnormality. Hepatobiliary: There is a rounded hypodensity in the right lobe of the liver which is too small to characterize,  possibly a cyst or hemangioma. The gallbladder and bile ducts are within normal limits. Pancreas: Unremarkable. No pancreatic ductal dilatation or surrounding inflammatory changes. Spleen: Normal in size without focal abnormality. Adrenals/Urinary Tract: Adrenal glands are unremarkable. Kidneys are normal, without renal calculi, focal lesion, or hydronephrosis. Bladder is unremarkable. Stomach/Bowel: Gastric band is in place at the level of the gastroesophageal junction. The stomach is dilated with air-fluid levels. Mid and proximal small bowel loops are dilated with air-fluid levels measuring up to 4 cm. Definitive transition point is not visualized, but is likely somewhere within the lower abdomen as there are decompressed pelvic small bowel loops. The colon is nondilated. The appendix is likely surgically absent. There is no pneumatosis or free air. No focal wall thickening or inflammation identified. Additionally, there is fluid within the distal esophagus and distal esophageal wall thickening. Vascular/Lymphatic: No significant vascular findings are present. No  enlarged abdominal or pelvic lymph nodes. Reproductive: Status post hysterectomy. No adnexal masses. Other: Again seen is an umbilical hernia containing small bowel. There is no ascites or free air. Musculoskeletal: No acute or significant osseous findings. IMPRESSION: 1. Findings compatible with distal small-bowel obstruction. Exact point of transition not visualized, likely an pelvis. No free air. 2. Gastric band in place. 3. Distal esophageal wall thickening concerning for esophagitis. There is fluid reflux into the distal esophagus. 4. Umbilical hernia contains small bowel. This is not transition site for bowel obstruction. Electronically Signed   By: Ronney Asters M.D.   On: 07/26/2021 18:55  ? ?DG Abdomen Acute W/Chest ? ?Result Date: 07/26/2021 ?CLINICAL DATA:  Abdominal pain. Nausea since 1 o'clock this morning. EXAM: DG ABDOMEN ACUTE WITH 1 VIEW  CHEST COMPARISON:  CT of 08/02/2020. FINDINGS: Frontal view of the chest demonstrates overlying wires and leads. Midline trachea. Normal heart size. No pleural effusion or pneumothorax. Clear lungs. Mild left hemi

## 2021-07-28 NOTE — Progress Notes (Signed)
? ?PROGRESS NOTE ? ? ? ?Meagan Dean  JME:[id number] DOB: 1970/07/26 DOA: 07/26/2021 ?PCP: Meagan Heck, MD ? ? ?Brief Narrative: ?Meagan Dean is a 51 y.o. female with a history of small bowel obstruction s/p exploratory laparotomy/lysis of adhesions, hypertension, hyponatremia. Patient presented secondary to abdominal pain and was found to have evidence of small bowel obstruction on CT scan. NG tube placed and general surgery consulted. ? ? ?Assessment and Plan: ?SBO (small bowel obstruction) ([place]) ?Patient with a history of bowel obstruction. CT imaging suggests distal small bowel obstruction without exact transition point. NG tube placed and general surgery consulted. NG tube removed overnight secondary to improper positioning. SBO protocol initiated with contrast seen in the colon on abdominal x-ray. ?-NPO ?-General surgery recommendations: NG tube decompression ?-Continue IV fluids ? ?Hypokalemia ?Patient takes potassium supplementation as an outpatient. Potassium of 3.1 on BMP this morning. NPO at this time. ?-Potassium IV x6 runs ?-BMP in AM ? ?Diarrhea ?Unsure of significance at this time. Resolved. ? ?AKI (acute kidney injury) ([facility])-resolved as of 07/27/2021 ?Mild. Resolved with IV fluids. ? ?Umbilical hernia ?Reducible and not the site of transition on CT scan. ? ?Numbness and tingling ?Bilateral extremities. No neck pain. No associated weakness. Intermittent. Normal vitamin B12. ? ? ? ?DVT prophylaxis: SCDs ?Code Status:   Code Status: Full Code ?Family Communication: None at bedside ?Disposition Plan: Discharge likely home pending resolution of bowel obstruction ? ? ?Consultants:  ?General surgery ? ?Procedures:  ?NG tube ? ?Antimicrobials: ?None  ? ? ?Subjective: ?No flatus or bowel movement. Continued abdominal pain but overall feels a bit better.  ? ?Objective: ?BP 100/64 (BP Location: Right Arm)   Pulse 86   Temp 98.8 ?F (37.1 ?C) (Oral)   Resp 16   Ht '5\' 3"'$  (1.6 m)   Wt 75.8 kg   LMP  (LMP  Unknown) Comment: 07/2016  SpO2 97%   BMI 29.60 kg/m?  ? ?Examination: ? ?General exam: Appears calm and comfortable ?Respiratory system: Clear to auscultation. Respiratory effort normal. ?Cardiovascular system: S1 & S2 heard, RRR. No murmurs, rubs, gallops or clicks. ?Gastrointestinal system: Abdomen is minimally distended, soft and diffusely tender (upper>lower quadrants). Decreased bowel sounds heard. ?Central nervous system: Alert and oriented. No focal neurological deficits. ?Musculoskeletal: No edema. No calf tenderness ?Skin: No cyanosis. No rashes ?Psychiatry: Judgement and insight appear normal. Mood & affect appropriate.  ? ? ?Data Reviewed: I have personally reviewed following labs and imaging studies ? ?CBC ?Lab Results  ?Component Value Date  ? WBC 13.8 (H) 07/27/2021  ? RBC 4.52 07/27/2021  ? HGB 13.0 07/27/2021  ? HCT 38.8 07/27/2021  ? MCV 85.8 07/27/2021  ? MCH 28.8 07/27/2021  ? PLT 418 (H) 07/27/2021  ? MCHC 33.5 07/27/2021  ? RDW 14.2 07/27/2021  ? LYMPHSABS 0.7 07/26/2021  ? MONOABS 0.3 07/26/2021  ? EOSABS 0.0 07/26/2021  ? BASOSABS 0.0 07/26/2021  ? ? ? ?Last metabolic panel ?Lab Results  ?Component Value Date  ? NA 139 07/28/2021  ? K 3.1 (L) 07/28/2021  ? CL 106 07/28/2021  ? CO2 28 07/28/2021  ? BUN 18 07/28/2021  ? CREATININE 0.91 07/28/2021  ? GLUCOSE 104 (H) 07/28/2021  ? GFRNONAA >60 07/28/2021  ? GFRAA >60 08/16/2019  ? CALCIUM 8.3 (L) 07/28/2021  ? PHOS 2.6 11/29/2015  ? PROT 7.4 07/27/2021  ? ALBUMIN 3.9 07/27/2021  ? LABGLOB 3.1 06/27/2015  ? AGRATIO 1.3 06/27/2015  ? BILITOT 0.4 07/27/2021  ? ALKPHOS 62 07/27/2021  ?  AST 15 07/27/2021  ? ALT 10 07/27/2021  ? ANIONGAP 5 07/28/2021  ? ? ?GFR: ?Estimated Creatinine Clearance: 72.2 mL/min (by C-G formula based on SCr of 0.91 mg/dL). ? ?Recent Results (from the past 240 hour(s))  ?Resp Panel by RT-PCR (Flu A&B, Covid) Nasopharyngeal Swab     Status: None  ? Collection Time: 07/26/21  7:09 PM  ? Specimen: Nasopharyngeal Swab;  Nasopharyngeal(NP) swabs in vial transport medium  ?Result Value Ref Range Status  ? SARS Coronavirus 2 by RT PCR NEGATIVE NEGATIVE Final  ?  Comment: (NOTE) ?SARS-CoV-2 target nucleic acids are NOT DETECTED. ? ?The SARS-CoV-2 RNA is generally detectable in upper respiratory ?specimens during the acute phase of infection. The lowest ?concentration of SARS-CoV-2 viral copies this assay can detect is ?138 copies/mL. A negative result does not preclude SARS-Cov-2 ?infection and should not be used as the sole basis for treatment or ?other patient management decisions. A negative result may occur with  ?improper specimen collection/handling, submission of specimen other ?than nasopharyngeal swab, presence of viral mutation(s) within the ?areas targeted by this assay, and inadequate number of viral ?copies(<138 copies/mL). A negative result must be combined with ?clinical observations, patient history, and epidemiological ?information. The expected result is Negative. ? ?Fact Sheet for Patients:  ?EntrepreneurPulse.com.au ? ?Fact Sheet for Healthcare Providers:  ?IncredibleEmployment.be ? ?This test is no t yet approved or cleared by the [place] FDA and  ?has been authorized for detection and/or diagnosis of SARS-CoV-2 by ?FDA under an Emergency Use Authorization (EUA). This EUA will remain  ?in effect (meaning this test can be used) for the duration of the ?COVID-19 declaration under Section 564(b)(1) of the Act, 21 ?U.S.C.section 360bbb-3(b)(1), unless the authorization is terminated  ?or revoked sooner.  ? ? ?  ? Influenza A by PCR NEGATIVE NEGATIVE Final  ? Influenza B by PCR NEGATIVE NEGATIVE Final  ?  Comment: (NOTE) ?The Xpert Xpress SARS-CoV-2/FLU/RSV plus assay is intended as an aid ?in the diagnosis of influenza from Nasopharyngeal swab specimens and ?should not be used as a sole basis for treatment. Nasal washings and ?aspirates are unacceptable for Xpert Xpress  SARS-CoV-2/FLU/RSV ?testing. ? ?Fact Sheet for Patients: ?EntrepreneurPulse.com.au ? ?Fact Sheet for Healthcare Providers: ?IncredibleEmployment.be ? ?This test is not yet approved or cleared by the [place] FDA and ?has been authorized for detection and/or diagnosis of SARS-CoV-2 by ?FDA under an Emergency Use Authorization (EUA). This EUA will remain ?in effect (meaning this test can be used) for the duration of the ?COVID-19 declaration under Section 564(b)(1) of the Act, 21 U.S.C. ?section 360bbb-3(b)(1), unless the authorization is terminated or ?revoked. ? ?Performed at [facility], [address] ?[place], [place] [address] ?  ?  ? ? ?Radiology Studies: ?DG Abd 1 View ? ?Result Date: 07/27/2021 ?CLINICAL DATA:  NG tube reposition. EXAM: ABDOMEN - 1 VIEW COMPARISON:  07/26/2021. FINDINGS: There is gaseous distention of the stomach. The small bowel is excluded from the field of view. The enteric tube loops and kinks in the distal esophagus and terminates in the mid esophagus. IMPRESSION: Looping and kinking of the enteric tube in the distal esophagus. Repositioning is recommended. Electronically Signed   By: Brett Fairy M.D.   On: 07/27/2021 01:27  ? ?DG Abd 1 View ? ?Result Date: 07/26/2021 ?CLINICAL DATA:  NG tube placement. Tube was reposition from earlier placement. EXAM: ABDOMEN - 1 VIEW COMPARISON:  07/26/2021 at 8:31 p.m. FINDINGS: The enteric tube is again demonstrated coiled in  the chest, likely in the mid esophagus with the tip projecting at the level of the distal esophagus. The top loop of the tube is kinked. Repositioning is indicated. Gaseous distention of the stomach with gas-filled dilated upper abdominal small bowel and colon. A gastric lap band is present. Shallow inspiration. Lungs are clear. IMPRESSION: Enteric tube remains coiled in the level of the mid esophagus. Repositioning is indicated. Electronically Signed   By: Lucienne Capers M.D.   On: 07/26/2021 23:35  ? ?DG Abdomen 1 View ? ?Result Date: 07/26/2021 ?CLINICAL DATA:  NG tube placement. EXAM: ABDOMEN - 1 VIEW COMPARISON:  07/26/2021. FINDINGS: Distended loops of small bowel are noted in the left

## 2021-07-29 DIAGNOSIS — R197 Diarrhea, unspecified: Secondary | ICD-10-CM | POA: Diagnosis not present

## 2021-07-29 DIAGNOSIS — E876 Hypokalemia: Secondary | ICD-10-CM | POA: Diagnosis not present

## 2021-07-29 DIAGNOSIS — N179 Acute kidney failure, unspecified: Secondary | ICD-10-CM | POA: Diagnosis not present

## 2021-07-29 DIAGNOSIS — K56609 Unspecified intestinal obstruction, unspecified as to partial versus complete obstruction: Secondary | ICD-10-CM | POA: Diagnosis not present

## 2021-07-29 LAB — CBC
HCT: 29.7 % — ABNORMAL LOW (ref 36.0–46.0)
Hemoglobin: 9.5 g/dL — ABNORMAL LOW (ref 12.0–15.0)
MCH: 28.3 pg (ref 26.0–34.0)
MCHC: 32 g/dL (ref 30.0–36.0)
MCV: 88.4 fL (ref 80.0–100.0)
Platelets: 265 K/uL (ref 150–400)
RBC: 3.36 MIL/uL — ABNORMAL LOW (ref 3.87–5.11)
RDW: 13.6 % (ref 11.5–15.5)
WBC: 6.8 K/uL (ref 4.0–10.5)
nRBC: 0 % (ref 0.0–0.2)

## 2021-07-29 LAB — GLUCOSE, CAPILLARY
Glucose-Capillary: 71 mg/dL (ref 70–99)
Glucose-Capillary: 79 mg/dL (ref 70–99)
Glucose-Capillary: 78 mg/dL (ref 70–99)

## 2021-07-29 LAB — BASIC METABOLIC PANEL WITH GFR
Anion gap: 6 (ref 5–15)
BUN: 13 mg/dL (ref 6–20)
CO2: 27 mmol/L (ref 22–32)
Calcium: 8.2 mg/dL — ABNORMAL LOW (ref 8.9–10.3)
Chloride: 105 mmol/L (ref 98–111)
Creatinine, Ser: 0.7 mg/dL (ref 0.44–1.00)
GFR, Estimated: >60 mL/min
Glucose, Bld: 77 mg/dL (ref 70–99)
Potassium: 3.3 mmol/L — ABNORMAL LOW (ref 3.5–5.1)
Sodium: 138 mmol/L (ref 135–145)

## 2021-07-29 NOTE — Progress Notes (Signed)
? ?PROGRESS NOTE ? ? ? ?Meagan Dean  XTK:[id number] DOB: [birth year] DOA: 07/26/2021 ?PCP: Harvie Heck, MD ? ? ?Brief Narrative: ?Meagan Dean is a 51 y.o. female with a history of small bowel obstruction s/p exploratory laparotomy/lysis of adhesions, hypertension, hyponatremia. Patient presented secondary to abdominal pain and was found to have evidence of small bowel obstruction on CT scan. NG tube placed and general surgery consulted. ? ? ?Assessment and Plan: ?SBO (small bowel obstruction) ([facility]) ?Patient with a history of bowel obstruction. CT imaging suggests distal small bowel obstruction without exact transition point. NG tube placed and general surgery consulted. NG tube removed overnight secondary to improper positioning. SBO protocol initiated with contrast seen in the colon on abdominal x-ray. ?-NPO ?-General surgery recommendations: NG tube decompression ?-Continue IV fluids (will need to renew order with/without potassium in AM) ? ?Hypokalemia ?Patient takes potassium supplementation as an outpatient. Potassium of 3.3 on BMP this morning. NPO at this time. Patient did not tolerate IV potassium and was switched to potassium containing IV fluids. ?-Continue potassium containing IV fluids ?-BMP in AM ? ?Diarrhea ?Unsure of significance at this time. Resolved. ? ?AKI (acute kidney injury) (HCC)-resolved as of 07/27/2021 ?Mild. Resolved with IV fluids. ? ?Umbilical hernia ?Reducible and not the site of transition on CT scan. ? ?Numbness and tingling ?Bilateral extremities. No neck pain. No associated weakness. Intermittent. Normal vitamin B12. ? ? ? ?DVT prophylaxis: SCDs ?Code Status:   Code Status: Full Code ?Family Communication: None at bedside ?Disposition Plan: Discharge likely home pending resolution of bowel obstruction ? ? ?Consultants:  ?General surgery ? ?Procedures:  ?NG tube ? ?Antimicrobials: ?None  ? ? ?Subjective: ?Small liquid bowel movement. Continued abdominal pain. No nausea/vomiting. NG  tube was not functioning overnight but was adjusted. Patient feeling better after adjustment. ? ?Objective: ?BP (!) 92/59 (BP Location: Right Arm)   Pulse 75   Temp 98.6 ?F (37 ?C) (Oral)   Resp 16   Ht '5\' 3"'$  (1.6 m)   Wt 75.8 kg   LMP  (LMP Unknown) Comment: 07/2016  SpO2 99%   BMI 29.60 kg/m?  ? ?Examination: ? ?General exam: Appears calm and comfortable ?Respiratory system: Clear to auscultation. Respiratory effort normal. ?Cardiovascular system: S1 & S2 heard, RRR. No murmurs, rubs, gallops or clicks. ?Gastrointestinal system: Abdomen is distended, soft and generally tender especially in epigastric area. Normal bowel sounds heard. ?Central nervous system: Alert and oriented. No focal neurological deficits. ?Musculoskeletal: No edema. No calf tenderness ?Skin: No cyanosis. No rashes ?Psychiatry: Judgement and insight appear normal. Mood & affect appropriate.  ? ? ?Data Reviewed: I have personally reviewed following labs and imaging studies ? ?CBC ?Lab Results  ?Component Value Date  ? WBC 6.8 07/29/2021  ? RBC 3.36 (L) 07/29/2021  ? HGB 9.5 (L) 07/29/2021  ? HCT 29.7 (L) 07/29/2021  ? MCV 88.4 07/29/2021  ? MCH 28.3 07/29/2021  ? PLT 265 07/29/2021  ? MCHC 32.0 07/29/2021  ? RDW 13.6 07/29/2021  ? LYMPHSABS 0.7 07/26/2021  ? MONOABS 0.3 07/26/2021  ? EOSABS 0.0 07/26/2021  ? BASOSABS 0.0 07/26/2021  ? ? ? ?Last metabolic panel ?Lab Results  ?Component Value Date  ? NA 138 07/29/2021  ? K 3.3 (L) 07/29/2021  ? CL 105 07/29/2021  ? CO2 27 07/29/2021  ? BUN 13 07/29/2021  ? CREATININE 0.70 07/29/2021  ? GLUCOSE 77 07/29/2021  ? GFRNONAA >60 07/29/2021  ? GFRAA >60 08/16/2019  ? CALCIUM 8.2 (L) 07/29/2021  ? PHOS  2.6 11/29/2015  ? PROT 7.4 07/27/2021  ? ALBUMIN 3.9 07/27/2021  ? LABGLOB 3.1 06/27/2015  ? AGRATIO 1.3 06/27/2015  ? BILITOT 0.4 07/27/2021  ? ALKPHOS 62 07/27/2021  ? AST 15 07/27/2021  ? ALT 10 07/27/2021  ? ANIONGAP 6 07/29/2021  ? ? ?GFR: ?Estimated Creatinine Clearance: 82.1 mL/min (by C-G  formula based on SCr of 0.7 mg/dL). ? ?Recent Results (from the past 240 hour(s))  ?Resp Panel by RT-PCR (Flu A&B, Covid) Nasopharyngeal Swab     Status: None  ? Collection Time: 07/26/21  7:09 PM  ? Specimen: Nasopharyngeal Swab; Nasopharyngeal(NP) swabs in vial transport medium  ?Result Value Ref Range Status  ? SARS Coronavirus 2 by RT PCR NEGATIVE NEGATIVE Final  ?  Comment: (NOTE) ?SARS-CoV-2 target nucleic acids are NOT DETECTED. ? ?The SARS-CoV-2 RNA is generally detectable in upper respiratory ?specimens during the acute phase of infection. The lowest ?concentration of SARS-CoV-2 viral copies this assay can detect is ?138 copies/mL. A negative result does not preclude SARS-Cov-2 ?infection and should not be used as the sole basis for treatment or ?other patient management decisions. A negative result may occur with  ?improper specimen collection/handling, submission of specimen other ?than nasopharyngeal swab, presence of viral mutation(s) within the ?areas targeted by this assay, and inadequate number of viral ?copies(<138 copies/mL). A negative result must be combined with ?clinical observations, patient history, and epidemiological ?information. The expected result is Negative. ? ?Fact Sheet for Patients:  ?EntrepreneurPulse.com.au ? ?Fact Sheet for Healthcare Providers:  ?IncredibleEmployment.be ? ?This test is no t yet approved or cleared by the [place] FDA and  ?has been authorized for detection and/or diagnosis of SARS-CoV-2 by ?FDA under an Emergency Use Authorization (EUA). This EUA will remain  ?in effect (meaning this test can be used) for the duration of the ?COVID-19 declaration under Section 564(b)(1) of the Act, 21 ?U.S.C.section 360bbb-3(b)(1), unless the authorization is terminated  ?or revoked sooner.  ? ? ?  ? Influenza A by PCR NEGATIVE NEGATIVE Final  ? Influenza B by PCR NEGATIVE NEGATIVE Final  ?  Comment: (NOTE) ?The Xpert Xpress  SARS-CoV-2/FLU/RSV plus assay is intended as an aid ?in the diagnosis of influenza from Nasopharyngeal swab specimens and ?should not be used as a sole basis for treatment. Nasal washings and ?aspirates are unacceptable for Xpert Xpress SARS-CoV-2/FLU/RSV ?testing. ? ?Fact Sheet for Patients: ?EntrepreneurPulse.com.au ? ?Fact Sheet for Healthcare Providers: ?IncredibleEmployment.be ? ?This test is not yet approved or cleared by the [place] FDA and ?has been authorized for detection and/or diagnosis of SARS-CoV-2 by ?FDA under an Emergency Use Authorization (EUA). This EUA will remain ?in effect (meaning this test can be used) for the duration of the ?COVID-19 declaration under Section 564(b)(1) of the Act, 21 U.S.C. ?section 360bbb-3(b)(1), unless the authorization is terminated or ?revoked. ? ?Performed at [facility], [address] ?[place], [place] [address] ?  ?  ? ? ?Radiology Studies: ?DG Abd Portable 1V-Small Bowel Obstruction Protocol-24 hr delay ? ?Result Date: 07/28/2021 ?CLINICAL DATA:  24 hour delayed image for small bowel obstruction. EXAM: PORTABLE ABDOMEN - 1 VIEW COMPARISON:  Abdominal x-ray 07/28/2021 FINDINGS: Bowel-gas pattern appears normal. Contrast is seen throughout nondilated colon which now reaches the rectum. No dilated bowel loops are visualized. Catheter overlies the mid abdomen and left upper quadrant, unchanged. IMPRESSION: Nonobstructive bowel gas pattern.  Contrast reaches the rectum. Electronically Signed   By: Ronney Asters M.D.   On: 07/28/2021 19:18  ? ?  DG Abd Portable 1V-Small Bowel Obstruction Protocol-initial, 8 hr delay ? ?Result Date: 07/28/2021 ?CLINICAL DATA:  Small-bowel obstruction EXAM: PORTABLE ABDOMEN - 1 VIEW COMPARISON:  None. FINDINGS: Administered contrast is seen throughout a nondistended colon. Gastric lap band device in expected position. Nasogastric tube appears looped overlying the proximal stomach.  No gross free intraperitoneal gas. IMPRESSION: Administered contrast opacifies the nondistended colon. Normal abdominal gas pattern. Electronically Signed   By: Fidela Salisbury M.D.   On: 07/28/2021 03:12  ? ?DG Naso G Tube Pl

## 2021-07-29 NOTE — Progress Notes (Signed)
? ?Subjective/Chief Complaint: ?Denies flatus this morning.  Had small BM in the bed last night ?Was feeling increasing bloated until NG adjusted and now feels better ? ? ?Objective: ?Vital signs in last 24 hours: ?Temp:  [98.6 ?F (37 ?C)-98.8 ?F (37.1 ?C)] 98.6 ?F (58 ?C) (03/18 2144) ?Pulse Rate:  [75-88] 75 (03/19 0621) ?Resp:  [15-16] 16 (03/19 3825) ?BP: (92-111)/(59-76) 92/59 (03/19 0539) ?SpO2:  [98 %-100 %] 99 % (03/19 0621) ?Last BM Date : 07/26/21 ? ?Intake/Output from previous day: ?03/18 0701 - 03/19 0700 ?In: 1233.4 [P.O.:120; I.V.:999.9; IV Piggyback:113.4] ?Out: 1400 [Urine:450; Emesis/NG output:950] ?Intake/Output this shift: ?Total I/O ?In: -  ?Out: 600 [Emesis/NG output:600] ? ?Exam: ?Awake and alert ?Abdomen soft, non-distended, minimally tender ? ?Lab Results:  ?Recent Labs  ?  07/27/21 ?0409 07/29/21 ?7673  ?WBC 13.8* 6.8  ?HGB 13.0 9.5*  ?HCT 38.8 29.7*  ?PLT 418* 265  ? ?BMET ?Recent Labs  ?  07/28/21 ?0355 07/29/21 ?0427  ?NA 139 138  ?K 3.1* 3.3*  ?CL 106 105  ?CO2 28 27  ?GLUCOSE 104* 77  ?BUN 18 13  ?CREATININE 0.91 0.70  ?CALCIUM 8.3* 8.2*  ? ?PT/INR ?No results for input(s): LABPROT, INR in the last 72 hours. ?ABG ?No results for input(s): PHART, HCO3 in the last 72 hours. ? ?Invalid input(s): PCO2, PO2 ? ?Studies/Results: ?DG Abd Portable 1V-Small Bowel Obstruction Protocol-24 hr delay ? ?Result Date: 07/28/2021 ?CLINICAL DATA:  24 hour delayed image for small bowel obstruction. EXAM: PORTABLE ABDOMEN - 1 VIEW COMPARISON:  Abdominal x-ray 07/28/2021 FINDINGS: Bowel-gas pattern appears normal. Contrast is seen throughout nondilated colon which now reaches the rectum. No dilated bowel loops are visualized. Catheter overlies the mid abdomen and left upper quadrant, unchanged. IMPRESSION: Nonobstructive bowel gas pattern.  Contrast reaches the rectum. Electronically Signed   By: Ronney Asters M.D.   On: 07/28/2021 19:18  ? ?DG Abd Portable 1V-Small Bowel Obstruction Protocol-initial, 8 hr  delay ? ?Result Date: 07/28/2021 ?CLINICAL DATA:  Small-bowel obstruction EXAM: PORTABLE ABDOMEN - 1 VIEW COMPARISON:  None. FINDINGS: Administered contrast is seen throughout a nondistended colon. Gastric lap band device in expected position. Nasogastric tube appears looped overlying the proximal stomach. No gross free intraperitoneal gas. IMPRESSION: Administered contrast opacifies the nondistended colon. Normal abdominal gas pattern. Electronically Signed   By: Fidela Salisbury M.D.   On: 07/28/2021 03:12  ? ?DG Naso G Tube Plc W/Fl W/Rad ? ?Result Date: 07/27/2021 ?CLINICAL DATA:  Patient admitted with small bowel obstruction requiring decompression via NG tube. Repeated attempts at NG tube placement on the floor were unsuccessful due to tube kinking in the distal esophagus, possibly due to gastric band. Surgery removed fluid from her band and requested for NG tube placement with image-guidance. EXAM: NASO G TUBE PLACEMENT WITH FL AND WITH RAD CONTRAST:  None FLUOROSCOPY: Radiation Exposure Index (if provided by the fluoroscopic device): 6.4 mGy Number of Acquired Spot Images: 0 COMPARISON:  DG abdomen 07/26/21 FINDINGS: 14 fr NG tube easily advanced into the stomach. Patient tolerated procedure well. Post-procedure images show the NG tube coiled in the stomach with side port below the GE junction. Approximately 20-30 mL thin brown gastric fluid aspirated. IMPRESSION: Technically successful G-tube placement into the stomach. Read by: Soyla Dryer, NP Electronically Signed   By: Suzy Bouchard M.D.   On: 07/27/2021 15:40   ? ?Anti-infectives: ?Anti-infectives (From admission, onward)  ? ? None  ? ?  ? ? ?Assessment/Plan: ?SBO ? ?Continue NG given output  and improvement once it was adjusted ? ? ? ?Meagan Dean ?07/29/2021 ? ?

## 2021-07-30 DIAGNOSIS — N179 Acute kidney failure, unspecified: Secondary | ICD-10-CM | POA: Diagnosis not present

## 2021-07-30 DIAGNOSIS — E876 Hypokalemia: Secondary | ICD-10-CM | POA: Diagnosis not present

## 2021-07-30 DIAGNOSIS — R197 Diarrhea, unspecified: Secondary | ICD-10-CM | POA: Diagnosis not present

## 2021-07-30 DIAGNOSIS — K56609 Unspecified intestinal obstruction, unspecified as to partial versus complete obstruction: Secondary | ICD-10-CM | POA: Diagnosis not present

## 2021-07-30 LAB — BASIC METABOLIC PANEL WITH GFR
Anion gap: 5 (ref 5–15)
BUN: 11 mg/dL (ref 6–20)
CO2: 29 mmol/L (ref 22–32)
Calcium: 8.3 mg/dL — ABNORMAL LOW (ref 8.9–10.3)
Chloride: 102 mmol/L (ref 98–111)
Creatinine, Ser: 0.85 mg/dL (ref 0.44–1.00)
GFR, Estimated: >60 mL/min
Glucose, Bld: 78 mg/dL (ref 70–99)
Potassium: 3.2 mmol/L — ABNORMAL LOW (ref 3.5–5.1)
Sodium: 136 mmol/L (ref 135–145)

## 2021-07-30 LAB — GLUCOSE, CAPILLARY
Glucose-Capillary: 107 mg/dL — ABNORMAL HIGH (ref 70–99)
Glucose-Capillary: 107 mg/dL — ABNORMAL HIGH (ref 70–99)
Glucose-Capillary: 83 mg/dL (ref 70–99)

## 2021-07-30 LAB — CBC
HCT: 30.9 % — ABNORMAL LOW (ref 36.0–46.0)
Hemoglobin: 10.1 g/dL — ABNORMAL LOW (ref 12.0–15.0)
MCH: 28.1 pg (ref 26.0–34.0)
MCHC: 32.7 g/dL (ref 30.0–36.0)
MCV: 86.1 fL (ref 80.0–100.0)
Platelets: 252 10*3/uL (ref 150–400)
RBC: 3.59 MIL/uL — ABNORMAL LOW (ref 3.87–5.11)
RDW: 13.2 % (ref 11.5–15.5)
WBC: 5.6 10*3/uL (ref 4.0–10.5)
nRBC: 0 % (ref 0.0–0.2)

## 2021-07-30 LAB — POTASSIUM: Potassium: 3.8 mmol/L (ref 3.5–5.1)

## 2021-07-30 MED ORDER — SODIUM CHLORIDE 0.45 % IV SOLN
INTRAVENOUS | Status: AC
  Filled 2021-07-30: qty 1000

## 2021-07-30 MED ORDER — SODIUM CHLORIDE 0.45 % IV SOLN: INTRAVENOUS | Status: DC

## 2021-07-30 NOTE — Progress Notes (Addendum)
? ?PROGRESS NOTE ? ? ? ?Meagan Dean  BWG:[id number] DOB: 11/04/1970 DOA: 07/26/2021 ?PCP: Harvie Heck, MD ? ? ?Brief Narrative: ?Meagan Dean is a 51 y.o. female with a history of small bowel obstruction s/p exploratory laparotomy/lysis of adhesions, hypertension, hyponatremia. Patient presented secondary to abdominal pain and was found to have evidence of small bowel obstruction on CT scan. NG tube placed and general surgery consulted. ? ? ?Assessment and Plan: ?SBO (small bowel obstruction) ([facility]) ?Patient with a history of bowel obstruction. CT imaging suggests distal small bowel obstruction without exact transition point. NG tube placed and general surgery consulted. NG tube removed overnight secondary to improper positioning. SBO protocol initiated with contrast seen in the colon on abdominal x-ray. ?-General surgery recommendations: NG tube clamped; CLD ?-Continue IV fluids ? ?Hypokalemia ?Patient takes potassium supplementation as an outpatient. Potassium of 3.2 on BMP this morning. NPO at this time. Patient did not tolerate IV potassium and was switched to potassium containing IV fluids.  ?-Switch to 1/2 NS w/ 40 meq potassium in 500 mL bag for 10 hours infusion and repeat potassium this evening ?-BMP in AM ? ?Diarrhea ?Infrequent watery stools of small amount. ? ?AKI (acute kidney injury) (HCC)-resolved as of 07/27/2021 ?Mild. Resolved with IV fluids. ? ?Umbilical hernia ?Reducible and not the site of transition on CT scan. ? ?Numbness and tingling ?Bilateral extremities. No neck pain. No associated weakness. Intermittent. Normal vitamin B12. ? ? ? ?DVT prophylaxis: SCDs ?Code Status:   Code Status: Full Code ?Family Communication: None at bedside ?Disposition Plan: Discharge likely home pending resolution of bowel obstruction ? ? ?Consultants:  ?General surgery ? ?Procedures:  ?NG tube ? ?Antimicrobials: ?None  ? ? ?Subjective: ?Continues to have abdominal fullness. No flatus. Had some liquid stool during  bowel movement. No nausea or vomiting. ? ?Objective: ?BP 93/61   Pulse 66   Temp 98.7 ?F (37.1 ?C) (Oral)   Resp 14   Ht '5\' 3"'$  (1.6 m)   Wt 75.8 kg   LMP  (LMP Unknown) Comment: 07/2016  SpO2 100%   BMI 29.60 kg/m?  ? ?Examination: ? ?General exam: Appears calm and comfortable ?Respiratory system: Clear to auscultation. Respiratory effort normal. ?Cardiovascular system: S1 & S2 heard, RRR. No murmurs, rubs, gallops or clicks. ?Gastrointestinal system: Abdomen is nondistended, soft and generally tender. No organomegaly or masses felt. Normal bowel sounds heard. ?Central nervous system: Alert and oriented. No focal neurological deficits. ?Musculoskeletal: No edema. No calf tenderness ?Skin: No cyanosis. No rashes ?Psychiatry: Judgement and insight appear normal. Mood & affect appropriate.  ? ? ?Data Reviewed: I have personally reviewed following labs and imaging studies ? ?CBC ?Lab Results  ?Component Value Date  ? WBC 5.6 07/30/2021  ? RBC 3.59 (L) 07/30/2021  ? HGB 10.1 (L) 07/30/2021  ? HCT 30.9 (L) 07/30/2021  ? MCV 86.1 07/30/2021  ? MCH 28.1 07/30/2021  ? PLT 252 07/30/2021  ? MCHC 32.7 07/30/2021  ? RDW 13.2 07/30/2021  ? LYMPHSABS 0.7 07/26/2021  ? MONOABS 0.3 07/26/2021  ? EOSABS 0.0 07/26/2021  ? BASOSABS 0.0 07/26/2021  ? ? ? ?Last metabolic panel ?Lab Results  ?Component Value Date  ? NA 136 07/30/2021  ? K 3.2 (L) 07/30/2021  ? CL 102 07/30/2021  ? CO2 29 07/30/2021  ? BUN 11 07/30/2021  ? CREATININE 0.85 07/30/2021  ? GLUCOSE 78 07/30/2021  ? GFRNONAA >60 07/30/2021  ? GFRAA >60 08/16/2019  ? CALCIUM 8.3 (L) 07/30/2021  ? PHOS 2.6 11/29/2015  ?  PROT 7.4 07/27/2021  ? ALBUMIN 3.9 07/27/2021  ? LABGLOB 3.1 06/27/2015  ? AGRATIO 1.3 06/27/2015  ? BILITOT 0.4 07/27/2021  ? ALKPHOS 62 07/27/2021  ? AST 15 07/27/2021  ? ALT 10 07/27/2021  ? ANIONGAP 5 07/30/2021  ? ? ?GFR: ?Estimated Creatinine Clearance: 77.3 mL/min (by C-G formula based on SCr of 0.85 mg/dL). ? ?Recent Results (from the past 240  hour(s))  ?Resp Panel by RT-PCR (Flu A&B, Covid) Nasopharyngeal Swab     Status: None  ? Collection Time: 07/26/21  7:09 PM  ? Specimen: Nasopharyngeal Swab; Nasopharyngeal(NP) swabs in vial transport medium  ?Result Value Ref Range Status  ? SARS Coronavirus 2 by RT PCR NEGATIVE NEGATIVE Final  ?  Comment: (NOTE) ?SARS-CoV-2 target nucleic acids are NOT DETECTED. ? ?The SARS-CoV-2 RNA is generally detectable in upper respiratory ?specimens during the acute phase of infection. The lowest ?concentration of SARS-CoV-2 viral copies this assay can detect is ?138 copies/mL. A negative result does not preclude SARS-Cov-2 ?infection and should not be used as the sole basis for treatment or ?other patient management decisions. A negative result may occur with  ?improper specimen collection/handling, submission of specimen other ?than nasopharyngeal swab, presence of viral mutation(s) within the ?areas targeted by this assay, and inadequate number of viral ?copies(<138 copies/mL). A negative result must be combined with ?clinical observations, patient history, and epidemiological ?information. The expected result is Negative. ? ?Fact Sheet for Patients:  ?EntrepreneurPulse.com.au ? ?Fact Sheet for Healthcare Providers:  ?IncredibleEmployment.be ? ?This test is no t yet approved or cleared by the [place] FDA and  ?has been authorized for detection and/or diagnosis of SARS-CoV-2 by ?FDA under an Emergency Use Authorization (EUA). This EUA will remain  ?in effect (meaning this test can be used) for the duration of the ?COVID-19 declaration under Section 564(b)(1) of the Act, 21 ?U.S.C.section 360bbb-3(b)(1), unless the authorization is terminated  ?or revoked sooner.  ? ? ?  ? Influenza A by PCR NEGATIVE NEGATIVE Final  ? Influenza B by PCR NEGATIVE NEGATIVE Final  ?  Comment: (NOTE) ?The Xpert Xpress SARS-CoV-2/FLU/RSV plus assay is intended as an aid ?in the diagnosis of influenza from  Nasopharyngeal swab specimens and ?should not be used as a sole basis for treatment. Nasal washings and ?aspirates are unacceptable for Xpert Xpress SARS-CoV-2/FLU/RSV ?testing. ? ?Fact Sheet for Patients: ?EntrepreneurPulse.com.au ? ?Fact Sheet for Healthcare Providers: ?IncredibleEmployment.be ? ?This test is not yet approved or cleared by the [place] FDA and ?has been authorized for detection and/or diagnosis of SARS-CoV-2 by ?FDA under an Emergency Use Authorization (EUA). This EUA will remain ?in effect (meaning this test can be used) for the duration of the ?COVID-19 declaration under Section 564(b)(1) of the Act, 21 U.S.C. ?section 360bbb-3(b)(1), unless the authorization is terminated or ?revoked. ? ?Performed at [facility], [address] ?[place], [place] [address] ?  ?  ? ? ?Radiology Studies: ?DG Abd Portable 1V-Small Bowel Obstruction Protocol-24 hr delay ? ?Result Date: 07/28/2021 ?CLINICAL DATA:  24 hour delayed image for small bowel obstruction. EXAM: PORTABLE ABDOMEN - 1 VIEW COMPARISON:  Abdominal x-ray 07/28/2021 FINDINGS: Bowel-gas pattern appears normal. Contrast is seen throughout nondilated colon which now reaches the rectum. No dilated bowel loops are visualized. Catheter overlies the mid abdomen and left upper quadrant, unchanged. IMPRESSION: Nonobstructive bowel gas pattern.  Contrast reaches the rectum. Electronically Signed   By: Ronney Asters M.D.   On: 07/28/2021 19:18   ? ? ? LOS:  4 days  ? ? ?Cordelia Poche, MD ?[facility] ?07/30/2021, 11:22 AM ? ? ?If 7PM-7AM, please contact night-coverage ?[email or web address] ? ?

## 2021-07-30 NOTE — Progress Notes (Signed)
? ?Progress Note ? ?   ?Subjective: ?Pt reports she is having some diarrhea but does not feel relief afterwards. She is not passing much flatus. She still has some upper abdominal bloating. NGT present and currently connected to LIWS. She reports she sat up in the chair some yesterday. She is very concerned that she will be able to refill her lap band once she is over acute issues.  ? ?Objective: ?Vital signs in last 24 hours: ?Temp:  [98.1 ?F (36.7 ?C)-98.8 ?F (37.1 ?C)] 98.7 ?F (37.1 ?C) (03/20 4010) ?Pulse Rate:  [66-79] 66 (03/20 0620) ?Resp:  [14-15] 14 (03/20 2725) ?BP: (93-102)/(61-77) 93/61 (03/20 3664) ?SpO2:  [99 %-100 %] 100 % (03/20 0620) ?Last BM Date : 07/26/21 ? ?Intake/Output from previous day: ?03/19 0701 - 03/20 0700 ?In: 0  ?Out: 2400 [Urine:300; Emesis/NG output:2100] ?Intake/Output this shift: ?No intake/output data recorded. ? ?PE: ?General: pleasant, WD, overweight female who is laying in bed in NAD ?Heart: regular, rate, and rhythm.   ?Lungs: Respiratory effort nonlabored ?Abd: soft, mild generalized ttp without peritonitis, ND, +BS, NGT with thin non-bilious drainage, lap band port palpable in abdominal wall ?Psych: A&Ox3 with an appropriate affect.  ? ? ?Lab Results:  ?Recent Labs  ?  07/29/21 ?0427 07/30/21 ?0434  ?WBC 6.8 5.6  ?HGB 9.5* 10.1*  ?HCT 29.7* 30.9*  ?PLT 265 252  ? ?BMET ?Recent Labs  ?  07/29/21 ?0427 07/30/21 ?0434  ?NA 138 136  ?K 3.3* 3.2*  ?CL 105 102  ?CO2 27 29  ?GLUCOSE 77 78  ?BUN 13 11  ?CREATININE 0.70 0.85  ?CALCIUM 8.2* 8.3*  ? ?PT/INR ?No results for input(s): LABPROT, INR in the last 72 hours. ?CMP  ?   ?Component Value Date/Time  ? NA 136 07/30/2021 0434  ? NA 138 07/10/2021 0900  ? K 3.2 (L) 07/30/2021 0434  ? CL 102 07/30/2021 0434  ? CO2 29 07/30/2021 0434  ? GLUCOSE 78 07/30/2021 0434  ? BUN 11 07/30/2021 0434  ? BUN 15 07/10/2021 0900  ? CREATININE 0.85 07/30/2021 0434  ? CREATININE 0.75 07/03/2013 1003  ? CALCIUM 8.3 (L) 07/30/2021 0434  ? PROT 7.4  07/27/2021 0409  ? PROT 7.2 06/27/2015 1631  ? ALBUMIN 3.9 07/27/2021 0409  ? ALBUMIN 4.1 06/27/2015 1631  ? AST 15 07/27/2021 0409  ? ALT 10 07/27/2021 0409  ? ALKPHOS 62 07/27/2021 0409  ? BILITOT 0.4 07/27/2021 0409  ? BILITOT 0.3 06/27/2015 1631  ? GFRNONAA >60 07/30/2021 0434  ? GFRNONAA >89 07/03/2013 1003  ? GFRAA >60 08/16/2019 1653  ? GFRAA >89 07/03/2013 1003  ? ?Lipase  ?   ?Component Value Date/Time  ? LIPASE 44 07/26/2021 1615  ? ? ? ? ? ?Studies/Results: ?DG Abd Portable 1V-Small Bowel Obstruction Protocol-24 hr delay ? ?Result Date: 07/28/2021 ?CLINICAL DATA:  24 hour delayed image for small bowel obstruction. EXAM: PORTABLE ABDOMEN - 1 VIEW COMPARISON:  Abdominal x-ray 07/28/2021 FINDINGS: Bowel-gas pattern appears normal. Contrast is seen throughout nondilated colon which now reaches the rectum. No dilated bowel loops are visualized. Catheter overlies the mid abdomen and left upper quadrant, unchanged. IMPRESSION: Nonobstructive bowel gas pattern.  Contrast reaches the rectum. Electronically Signed   By: Ronney Asters M.D.   On: 07/28/2021 19:18   ? ?Anti-infectives: ?Anti-infectives (From admission, onward)  ? ? None  ? ?  ? ? ? ?Assessment/Plan ?SBO ?- Hx Laparoscopic Gastric Banding by Dr. Excell Seltzer (2010); Laparoscopic appendectomy for perforated appendicitis by Dr. Zella Richer  in 2016; exploratory laparotomy with loa for sbo by Dr. Grandville Silos in 2017 and abdominal hysterectomy with salpingectomy and LOA by OBGYN (2019) ?- CT w/ distal sbo without exact transition point visualized but likely in the pelvis ?- SBO protocol with contrast in colon on 8h delay film  ?- Keep K > 4 and Mg > 2 for bowel function ?- Mobilize for bowel function ?- Pt having some diarrhea but not passing much flatus and does not feel fully relieved after having diarrhea. Will trial clamping NGT and allowing clears today and re-examine this afternoon. If not tolerating well, will resume LIWS and get repeat film, may need to  consider trial of reglan if this seems to be more of a motility issue.  ?  ?FEN - NGT clamping and CLD, IVF per primary  ?VTE - SCDs, okay for chemical prophylaxis from a general surgery standpoint ?ID - None currently ?  ?Umbilical hernia - not the site of transition for obstruction. Reducible.   ? LOS: 4 days  ? ?I reviewed hospitalist notes, last 24 h vitals and pain scores, last 48 h intake and output, and last 24 h labs and trends. ? ? ?Norm Parcel, PA-C ?[facility] ?07/30/2021, 10:55 AM ?Please see Amion for pager number during day hours 7:00am-4:30pm ? ?

## 2021-07-31 DIAGNOSIS — E876 Hypokalemia: Secondary | ICD-10-CM | POA: Diagnosis not present

## 2021-07-31 DIAGNOSIS — N179 Acute kidney failure, unspecified: Secondary | ICD-10-CM | POA: Diagnosis not present

## 2021-07-31 DIAGNOSIS — R197 Diarrhea, unspecified: Secondary | ICD-10-CM | POA: Diagnosis not present

## 2021-07-31 DIAGNOSIS — K56609 Unspecified intestinal obstruction, unspecified as to partial versus complete obstruction: Secondary | ICD-10-CM | POA: Diagnosis not present

## 2021-07-31 LAB — BASIC METABOLIC PANEL
Anion gap: 3 — ABNORMAL LOW (ref 5–15)
BUN: 7 mg/dL (ref 6–20)
CO2: 29 mmol/L (ref 22–32)
Calcium: 8.7 mg/dL — ABNORMAL LOW (ref 8.9–10.3)
Chloride: 102 mmol/L (ref 98–111)
Creatinine, Ser: 0.65 mg/dL (ref 0.44–1.00)
GFR, Estimated: >60 mL/min (ref >60)
Glucose, Bld: 93 mg/dL (ref 70–99)
Potassium: 4.6 mmol/L (ref 3.5–5.1)
Sodium: 134 mmol/L — ABNORMAL LOW (ref 135–145)

## 2021-07-31 LAB — GLUCOSE, CAPILLARY
Glucose-Capillary: 86 mg/dL (ref 70–99)
Glucose-Capillary: 110 mg/dL — ABNORMAL HIGH (ref 70–99)
Glucose-Capillary: 96 mg/dL (ref 70–99)

## 2021-07-31 NOTE — Progress Notes (Signed)
? ?PROGRESS NOTE ? ? ? ?Meagan Dean  KXF:[id number] DOB: 11/23/1970 DOA: 07/26/2021 ?PCP: Harvie Heck, MD ? ? ?Brief Narrative: ?Meagan Dean is a 51 y.o. female with a history of small bowel obstruction s/p exploratory laparotomy/lysis of adhesions, hypertension, hyponatremia. Patient presented secondary to abdominal pain and was found to have evidence of small bowel obstruction on CT scan. NG tube placed and general surgery consulted. Diet advanced and NG tube removed on 3/20. ? ? ?Assessment and Plan: ?SBO (small bowel obstruction) ([facility]) ?Patient with a history of bowel obstruction. CT imaging suggests distal small bowel obstruction without exact transition point. NG tube placed and general surgery consulted. NG tube removed overnight secondary to improper positioning. SBO protocol initiated with contrast seen in the colon on abdominal x-ray. ?-General surgery recommendations: NG tube removed; CLD. Pending today ?-Hold IV fluids ? ?Hypokalemia ?Patient takes potassium supplementation as an outpatient. Potassium of 3.2 on BMP this morning. NPO at this time. Patient did not tolerate IV potassium and was switched to potassium containing IV fluids. Potassium improved to 4.6 on BMP this morning with repletion. ?-BMP daily ? ?Diarrhea ?Infrequent watery stools of small amount. ? ?AKI (acute kidney injury) (HCC)-resolved as of 07/27/2021 ?Mild. Resolved with IV fluids. ? ?Umbilical hernia ?Reducible and not the site of transition on CT scan. ? ?Numbness and tingling ?Bilateral extremities. No neck pain. No associated weakness. Intermittent. Normal vitamin B12. ? ? ? ?DVT prophylaxis: SCDs ?Code Status:   Code Status: Full Code ?Family Communication: None at bedside ?Disposition Plan: Discharge likely home pending resolution of bowel obstruction ? ? ?Consultants:  ?General surgery ? ?Procedures:  ?NG tube ? ?Antimicrobials: ?None  ? ? ?Subjective: ?Continued abdominal fullness. Some diarrhea but no full bowel movement.  No flatus. ? ?Objective: ?BP 91/62 (BP Location: Left Arm)   Pulse 75   Temp 98.4 ?F (36.9 ?C) (Oral)   Resp 18   Ht '5\' 3"'$  (1.6 m)   Wt 75.8 kg   LMP  (LMP Unknown) Comment: 07/2016  SpO2 99%   BMI 29.60 kg/m?  ? ?Examination: ? ?General exam: Appears calm and comfortable ?Respiratory system: Clear to auscultation. Respiratory effort normal. ?Cardiovascular system: S1 & S2 heard, RRR. No murmurs, rubs, gallops or clicks. ?Gastrointestinal system: Abdomen is nondistended, soft and tender in epigastric area. Decreased bowel sounds heard. ?Central nervous system: Alert and oriented. No focal neurological deficits. ?Musculoskeletal: No edema. No calf tenderness ?Skin: No cyanosis. No rashes ?Psychiatry: Judgement and insight appear normal. Mood & affect appropriate.  ? ? ?Data Reviewed: I have personally reviewed following labs and imaging studies ? ?CBC ?Lab Results  ?Component Value Date  ? WBC 5.6 07/30/2021  ? RBC 3.59 (L) 07/30/2021  ? HGB 10.1 (L) 07/30/2021  ? HCT 30.9 (L) 07/30/2021  ? MCV 86.1 07/30/2021  ? MCH 28.1 07/30/2021  ? PLT 252 07/30/2021  ? MCHC 32.7 07/30/2021  ? RDW 13.2 07/30/2021  ? LYMPHSABS 0.7 07/26/2021  ? MONOABS 0.3 07/26/2021  ? EOSABS 0.0 07/26/2021  ? BASOSABS 0.0 07/26/2021  ? ? ? ?Last metabolic panel ?Lab Results  ?Component Value Date  ? NA 134 (L) 07/31/2021  ? K 4.6 07/31/2021  ? CL 102 07/31/2021  ? CO2 29 07/31/2021  ? BUN 7 07/31/2021  ? CREATININE 0.65 07/31/2021  ? GLUCOSE 93 07/31/2021  ? GFRNONAA >60 07/31/2021  ? GFRAA >60 08/16/2019  ? CALCIUM 8.7 (L) 07/31/2021  ? PHOS 2.6 11/29/2015  ? PROT 7.4 07/27/2021  ? ALBUMIN  3.9 07/27/2021  ? LABGLOB 3.1 06/27/2015  ? AGRATIO 1.3 06/27/2015  ? BILITOT 0.4 07/27/2021  ? ALKPHOS 62 07/27/2021  ? AST 15 07/27/2021  ? ALT 10 07/27/2021  ? ANIONGAP 3 (L) 07/31/2021  ? ? ?GFR: ?Estimated Creatinine Clearance: 82.1 mL/min (by C-G formula based on SCr of 0.65 mg/dL). ? ?Recent Results (from the past 240 hour(s))  ?Resp Panel by  RT-PCR (Flu A&B, Covid) Nasopharyngeal Swab     Status: None  ? Collection Time: 07/26/21  7:09 PM  ? Specimen: Nasopharyngeal Swab; Nasopharyngeal(NP) swabs in vial transport medium  ?Result Value Ref Range Status  ? SARS Coronavirus 2 by RT PCR NEGATIVE NEGATIVE Final  ?  Comment: (NOTE) ?SARS-CoV-2 target nucleic acids are NOT DETECTED. ? ?The SARS-CoV-2 RNA is generally detectable in upper respiratory ?specimens during the acute phase of infection. The lowest ?concentration of SARS-CoV-2 viral copies this assay can detect is ?138 copies/mL. A negative result does not preclude SARS-Cov-2 ?infection and should not be used as the sole basis for treatment or ?other patient management decisions. A negative result may occur with  ?improper specimen collection/handling, submission of specimen other ?than nasopharyngeal swab, presence of viral mutation(s) within the ?areas targeted by this assay, and inadequate number of viral ?copies(<138 copies/mL). A negative result must be combined with ?clinical observations, patient history, and epidemiological ?information. The expected result is Negative. ? ?Fact Sheet for Patients:  ?EntrepreneurPulse.com.au ? ?Fact Sheet for Healthcare Providers:  ?IncredibleEmployment.be ? ?This test is no t yet approved or cleared by the [place] FDA and  ?has been authorized for detection and/or diagnosis of SARS-CoV-2 by ?FDA under an Emergency Use Authorization (EUA). This EUA will remain  ?in effect (meaning this test can be used) for the duration of the ?COVID-19 declaration under Section 564(b)(1) of the Act, 21 ?U.S.C.section 360bbb-3(b)(1), unless the authorization is terminated  ?or revoked sooner.  ? ? ?  ? Influenza A by PCR NEGATIVE NEGATIVE Final  ? Influenza B by PCR NEGATIVE NEGATIVE Final  ?  Comment: (NOTE) ?The Xpert Xpress SARS-CoV-2/FLU/RSV plus assay is intended as an aid ?in the diagnosis of influenza from Nasopharyngeal swab  specimens and ?should not be used as a sole basis for treatment. Nasal washings and ?aspirates are unacceptable for Xpert Xpress SARS-CoV-2/FLU/RSV ?testing. ? ?Fact Sheet for Patients: ?EntrepreneurPulse.com.au ? ?Fact Sheet for Healthcare Providers: ?IncredibleEmployment.be ? ?This test is not yet approved or cleared by the [place] FDA and ?has been authorized for detection and/or diagnosis of SARS-CoV-2 by ?FDA under an Emergency Use Authorization (EUA). This EUA will remain ?in effect (meaning this test can be used) for the duration of the ?COVID-19 declaration under Section 564(b)(1) of the Act, 21 U.S.C. ?section 360bbb-3(b)(1), unless the authorization is terminated or ?revoked. ? ?Performed at [facility], [address] ?[place], [place] [address] ?  ?  ? ? ?Radiology Studies: ?No results found. ? ? ? LOS: 5 days  ? ? ?Cordelia Poche, MD ?[facility] ?07/31/2021, 8:11 AM ? ? ?If 7PM-7AM, please contact night-coverage ?[email or web address] ? ?

## 2021-07-31 NOTE — Progress Notes (Signed)
? ?Progress Note ? ?   ?Subjective: ?Pt reports she is still having some diarrhea but still feels bloated. Tolerated NG clamping with clears yesterday.  Ambulating some  ? ?Objective: ?Vital signs in last 24 hours: ?Temp:  [98.4 ?F (36.9 ?C)-99 ?F (37.2 ?C)] 98.4 ?F (36.9 ?C) (03/21 0515) ?Pulse Rate:  [71-82] 75 (03/21 0515) ?Resp:  [16-18] 18 (03/21 0515) ?BP: (91-112)/(62-76) 91/62 (03/21 0515) ?SpO2:  [99 %-100 %] 99 % (03/21 0515) ?Last BM Date : 07/26/21 ? ?Intake/Output from previous day: ?03/20 0701 - 03/21 0700 ?In: 1880.2 [P.O.:1080; I.V.:800.2] ?Out: 50 [Emesis/NG output:50] ?Intake/Output this shift: ?No intake/output data recorded. ? ?PE: ?General: pleasant, WD, overweight female who is laying in bed in NAD ?Abd: soft ?Psych: A&Ox3 with an appropriate affect.  ? ? ?Lab Results:  ?Recent Labs  ?  07/29/21 ?0427 07/30/21 ?0434  ?WBC 6.8 5.6  ?HGB 9.5* 10.1*  ?HCT 29.7* 30.9*  ?PLT 265 252  ? ? ?BMET ?Recent Labs  ?  07/30/21 ?0434 07/30/21 ?1800 07/31/21 ?0421  ?NA 136  --  134*  ?K 3.2* 3.8 4.6  ?CL 102  --  102  ?CO2 29  --  29  ?GLUCOSE 78  --  93  ?BUN 11  --  7  ?CREATININE 0.85  --  0.65  ?CALCIUM 8.3*  --  8.7*  ? ? ?PT/INR ?No results for input(s): LABPROT, INR in the last 72 hours. ?CMP  ?   ?Component Value Date/Time  ? NA 134 (L) 07/31/2021 0421  ? NA 138 07/10/2021 0900  ? K 4.6 07/31/2021 0421  ? CL 102 07/31/2021 0421  ? CO2 29 07/31/2021 0421  ? GLUCOSE 93 07/31/2021 0421  ? BUN 7 07/31/2021 0421  ? BUN 15 07/10/2021 0900  ? CREATININE 0.65 07/31/2021 0421  ? CREATININE 0.75 07/03/2013 1003  ? CALCIUM 8.7 (L) 07/31/2021 0421  ? PROT 7.4 07/27/2021 0409  ? PROT 7.2 06/27/2015 1631  ? ALBUMIN 3.9 07/27/2021 0409  ? ALBUMIN 4.1 06/27/2015 1631  ? AST 15 07/27/2021 0409  ? ALT 10 07/27/2021 0409  ? ALKPHOS 62 07/27/2021 0409  ? BILITOT 0.4 07/27/2021 0409  ? BILITOT 0.3 06/27/2015 1631  ? GFRNONAA >60 07/31/2021 0421  ? GFRNONAA >89 07/03/2013 1003  ? GFRAA >60 08/16/2019 1653  ? GFRAA >89  07/03/2013 1003  ? ?Lipase  ?   ?Component Value Date/Time  ? LIPASE 44 07/26/2021 1615  ? ? ? ? ? ?Studies/Results: ?No results found. ? ?Anti-infectives: ?Anti-infectives (From admission, onward)  ? ? None  ? ?  ? ? ? ?Assessment/Plan ?SBO ?- Hx Laparoscopic Gastric Banding by Dr. Excell Seltzer (2010); Laparoscopic appendectomy for perforated appendicitis by Dr. Zella Richer in 2016; exploratory laparotomy with loa for sbo by Dr. Grandville Silos in 2017 and abdominal hysterectomy with salpingectomy and LOA by OBGYN (2019) ?- CT w/ distal sbo without exact transition point visualized but likely in the pelvis ?- SBO protocol with contrast in colon on 8h delay film  ?- Keep K > 4 and Mg > 2 for bowel function ?- Ambulate in hall for bowel function ?- Pt having bowel function ?  ?FEN - advance to soft diet, IVF per primary, consider Reglan if pt develops nausea today with eating ?VTE - SCDs, okay for chemical prophylaxis from a general surgery standpoint ?ID - None currently ?  ?Umbilical hernia - not the site of transition for obstruction. Reducible.   ? LOS: 5 days  ? ?I reviewed hospitalist notes, last  24 h vitals and pain scores, last 48 h intake and output, and last 24 h labs and trends. ? ?Straightforward medical decision making ? ?Rosario Adie, MD ?[facility] ?07/31/2021, 8:35 AM ?Please see Amion for pager number during day hours 7:00am-4:30pm ? ?

## 2021-08-01 DIAGNOSIS — K56609 Unspecified intestinal obstruction, unspecified as to partial versus complete obstruction: Secondary | ICD-10-CM | POA: Diagnosis not present

## 2021-08-01 LAB — GLUCOSE, CAPILLARY
Glucose-Capillary: 101 mg/dL — ABNORMAL HIGH (ref 70–99)
Glucose-Capillary: 102 mg/dL — ABNORMAL HIGH (ref 70–99)
Glucose-Capillary: 112 mg/dL — ABNORMAL HIGH (ref 70–99)
Glucose-Capillary: 94 mg/dL (ref 70–99)

## 2021-08-01 LAB — BASIC METABOLIC PANEL
Anion gap: 6 (ref 5–15)
BUN: 15 mg/dL (ref 6–20)
CO2: 28 mmol/L (ref 22–32)
Calcium: 8.6 mg/dL — ABNORMAL LOW (ref 8.9–10.3)
Chloride: 101 mmol/L (ref 98–111)
Creatinine, Ser: 0.89 mg/dL (ref 0.44–1.00)
GFR, Estimated: >60 mL/min (ref >60)
Glucose, Bld: 95 mg/dL (ref 70–99)
Potassium: 3.9 mmol/L (ref 3.5–5.1)
Sodium: 135 mmol/L (ref 135–145)

## 2021-08-01 MED ORDER — METOCLOPRAMIDE HCL 5 MG/ML IJ SOLN
5.0000 mg | Freq: Three times a day (TID) | INTRAMUSCULAR | Status: DC
  Administered 2021-08-01 – 2021-08-02 (×3): 5 mg via INTRAVENOUS
  Filled 2021-08-01 (×3): qty 2

## 2021-08-01 NOTE — Progress Notes (Signed)
[facility] ?PROGRESS NOTE ? ?Patient: Meagan Dean ETK:[id number]   ?PCP: Harvie Heck, MD DOB: April 20, 1971   ?DOA: 07/26/2021   DOS: 08/01/2021   ? ?Subjective: Continues to have nausea and vomiting.  Passing gas.  Diffuse abdominal pain reported.  No fever no chills. ? ?Objective:  ?Vitals:  ? 08/01/21 0518 08/01/21 1439  ?BP: (!) 84/65 103/64  ?Pulse: 67 82  ?Resp: 14 14  ?Temp: 97.9 ?F (36.6 ?C) 97.9 ?F (36.6 ?C)  ?SpO2: 100% 98%  ?  ?S1-S2 present. ?Clear to auscultation. ?Bowel sound present. ?No edema. ? ?Assessment and plan: ?Small bowel obstruction. ?Management per surgery. ?Currently initiating Reglan. ? ?Author: ?Berle Mull, MD ?[facility] ?08/01/2021 6:36 PM   ?If 7PM-7AM, please contact night-coverage at [email or web address]  ?

## 2021-08-01 NOTE — Progress Notes (Signed)
? ?Progress Note ? ?   ?Subjective: ?Pt having diarrhea still but still reports sensation of incomplete voiding. She had some nausea yesterday but no vomiting. No nausea this AM but is not really hungry. Reports she just doesn't feel well.  ? ?Objective: ?Vital signs in last 24 hours: ?Temp:  [97.9 ?F (36.6 ?C)-99.1 ?F (37.3 ?C)] 97.9 ?F (36.6 ?C) (03/22 0518) ?Pulse Rate:  [67-89] 67 (03/22 0518) ?Resp:  [14-18] 14 (03/22 0518) ?BP: (84-109)/(64-75) 84/65 (03/22 0518) ?SpO2:  [100 %] 100 % (03/22 0518) ?Last BM Date : 07/31/21 ? ?Intake/Output from previous day: ?03/21 0701 - 03/22 0700 ?In: 600 [P.O.:600] ?Out: 0  ?Intake/Output this shift: ?No intake/output data recorded. ? ?PE: ?General: pleasant, WD, overweight female who is laying in bed in NAD ?Heart: regular, rate, and rhythm.   ?Lungs: Respiratory effort nonlabored ?Abd: soft, NT, ND, +BS, lap band port palpable in abdominal wall ?Psych: A&Ox3 with an appropriate affect.  ? ? ?Lab Results:  ?Recent Labs  ?  07/30/21 ?0434  ?WBC 5.6  ?HGB 10.1*  ?HCT 30.9*  ?PLT 252  ? ?BMET ?Recent Labs  ?  07/31/21 ?0421 08/01/21 ?0413  ?NA 134* 135  ?K 4.6 3.9  ?CL 102 101  ?CO2 29 28  ?GLUCOSE 93 95  ?BUN 7 15  ?CREATININE 0.65 0.89  ?CALCIUM 8.7* 8.6*  ? ?PT/INR ?No results for input(s): LABPROT, INR in the last 72 hours. ?CMP  ?   ?Component Value Date/Time  ? NA 135 08/01/2021 0413  ? NA 138 07/10/2021 0900  ? K 3.9 08/01/2021 0413  ? CL 101 08/01/2021 0413  ? CO2 28 08/01/2021 0413  ? GLUCOSE 95 08/01/2021 0413  ? BUN 15 08/01/2021 0413  ? BUN 15 07/10/2021 0900  ? CREATININE 0.89 08/01/2021 0413  ? CREATININE 0.75 07/03/2013 1003  ? CALCIUM 8.6 (L) 08/01/2021 0413  ? PROT 7.4 07/27/2021 0409  ? PROT 7.2 06/27/2015 1631  ? ALBUMIN 3.9 07/27/2021 0409  ? ALBUMIN 4.1 06/27/2015 1631  ? AST 15 07/27/2021 0409  ? ALT 10 07/27/2021 0409  ? ALKPHOS 62 07/27/2021 0409  ? BILITOT 0.4 07/27/2021 0409  ? BILITOT 0.3 06/27/2015 1631  ? GFRNONAA >60 08/01/2021 0413  ? GFRNONAA  >89 07/03/2013 1003  ? GFRAA >60 08/16/2019 1653  ? GFRAA >89 07/03/2013 1003  ? ?Lipase  ?   ?Component Value Date/Time  ? LIPASE 44 07/26/2021 1615  ? ? ? ? ? ?Studies/Results: ?No results found. ? ?Anti-infectives: ?Anti-infectives (From admission, onward)  ? ? None  ? ?  ? ? ? ?Assessment/Plan ?SBO ?- Hx Laparoscopic Gastric Banding by Dr. Excell Seltzer (2010); Laparoscopic appendectomy for perforated appendicitis by Dr. Zella Richer in 2016; exploratory laparotomy with loa for sbo by Dr. Grandville Silos in 2017 and abdominal hysterectomy with salpingectomy and LOA by OBGYN (2019) ?- CT w/ distal sbo without exact transition point visualized but likely in the pelvis ?- SBO protocol with contrast in colon on 8h delay film  ?- Keep K > 4 and Mg > 2 for bowel function ?- Ambulate in hall for bowel function ?- Pt having bowel function but still having some nausea and cramping pain  ?- trial reglan today to see if that helps from a motility standpoint, will start 5 mg q8h. Discussed possible side effect of EPS with patient  ?  ?FEN - soft diet, reglan ?VTE - SCDs, okay for chemical prophylaxis from a general surgery standpoint ?ID - None currently ?  ?Umbilical hernia -  not the site of transition for obstruction. Reducible.   ? LOS: 6 days  ? ?I reviewed hospitalist notes, last 24 h vitals and pain scores, last 48 h intake and output, and last 24 h labs and trends. ? ? ? ? ?Norm Parcel, PA-C ?[facility] ?08/01/2021, 10:00 AM ?Please see Amion for pager number during day hours 7:00am-4:30pm ? ?

## 2021-08-02 DIAGNOSIS — K56609 Unspecified intestinal obstruction, unspecified as to partial versus complete obstruction: Secondary | ICD-10-CM | POA: Diagnosis not present

## 2021-08-02 LAB — GLUCOSE, CAPILLARY: Glucose-Capillary: 91 mg/dL (ref 70–99)

## 2021-08-02 LAB — BASIC METABOLIC PANEL
Anion gap: 6 (ref 5–15)
BUN: 17 mg/dL (ref 6–20)
CO2: 25 mmol/L (ref 22–32)
Calcium: 8.3 mg/dL — ABNORMAL LOW (ref 8.9–10.3)
Chloride: 104 mmol/L (ref 98–111)
Creatinine, Ser: 0.59 mg/dL (ref 0.44–1.00)
GFR, Estimated: >60 mL/min (ref >60)
Glucose, Bld: 96 mg/dL (ref 70–99)
Potassium: 3.7 mmol/L (ref 3.5–5.1)
Sodium: 135 mmol/L (ref 135–145)

## 2021-08-02 LAB — MAGNESIUM: Magnesium: 1.7 mg/dL (ref 1.7–2.4)

## 2021-08-02 MED ORDER — ENSURE ENLIVE PO LIQD: 237.0000 mL | Freq: Three times a day (TID) | ORAL | 0 refills | Status: DC

## 2021-08-02 MED ORDER — OXYCODONE HCL 5 MG PO TABS: 5.0000 mg | ORAL_TABLET | ORAL | Status: DC | PRN

## 2021-08-02 MED ORDER — PSYLLIUM 58.6 % PO PACK: 1.0000 | PACK | Freq: Every day | ORAL | 0 refills | Status: DC | PRN

## 2021-08-02 MED ORDER — ADULT MULTIVITAMIN W/MINERALS CH: 1.0000 | ORAL_TABLET | Freq: Every day | ORAL | Status: DC

## 2021-08-02 MED ORDER — METOCLOPRAMIDE HCL 5 MG PO TABS: 5.0000 mg | ORAL_TABLET | Freq: Three times a day (TID) | ORAL | 0 refills | Status: DC

## 2021-08-02 MED ORDER — ENSURE ENLIVE PO LIQD: 237.0000 mL | Freq: Three times a day (TID) | ORAL | Status: DC

## 2021-08-02 MED ORDER — ADULT MULTIVITAMIN W/MINERALS CH: 1.0000 | ORAL_TABLET | Freq: Every day | ORAL | 0 refills | Status: DC

## 2021-08-02 NOTE — Progress Notes (Signed)
? ?Progress Note ? ?   ?Subjective: ?Pt reports a little more bloating and nausea after eating yesterday but no emesis and passing flatus. She ambulated several times in the hallway. No evidence of EPS after starting reglan yesterday.  ? ?Objective: ?Vital signs in last 24 hours: ?Temp:  [97.9 ?F (36.6 ?C)-99 ?F (37.2 ?C)] 98.5 ?F (36.9 ?C) (03/23 0515) ?Pulse Rate:  [71-85] 85 (03/23 0805) ?Resp:  [14-17] 16 (03/23 0805) ?BP: (87-103)/(55-68) 98/68 (03/23 0805) ?SpO2:  [98 %-100 %] 100 % (03/23 0805) ?Last BM Date : 07/31/21 ? ?Intake/Output from previous day: ?03/22 0701 - 03/23 0700 ?In: 24 [P.O.:760] ?Out: -  ?Intake/Output this shift: ?Total I/O ?In: 240 [P.O.:240] ?Out: -  ? ?PE: ?General: pleasant, WD, overweight female who is laying in bed in NAD ?Heart: regular, rate, and rhythm.   ?Lungs: Respiratory effort nonlabored ?Abd: soft, NT, ND, +BS, lap band port palpable in abdominal wall ?Psych: A&Ox3 with an appropriate affect.  ? ? ?Lab Results:  ?No results for input(s): WBC, HGB, HCT, PLT in the last 72 hours. ? ?BMET ?Recent Labs  ?  08/01/21 ?0413 08/02/21 ?0410  ?NA 135 135  ?K 3.9 3.7  ?CL 101 104  ?CO2 28 25  ?GLUCOSE 95 96  ?BUN 15 17  ?CREATININE 0.89 0.59  ?CALCIUM 8.6* 8.3*  ? ? ?PT/INR ?No results for input(s): LABPROT, INR in the last 72 hours. ?CMP  ?   ?Component Value Date/Time  ? NA 135 08/02/2021 0410  ? NA 138 07/10/2021 0900  ? K 3.7 08/02/2021 0410  ? CL 104 08/02/2021 0410  ? CO2 25 08/02/2021 0410  ? GLUCOSE 96 08/02/2021 0410  ? BUN 17 08/02/2021 0410  ? BUN 15 07/10/2021 0900  ? CREATININE 0.59 08/02/2021 0410  ? CREATININE 0.75 07/03/2013 1003  ? CALCIUM 8.3 (L) 08/02/2021 0410  ? PROT 7.4 07/27/2021 0409  ? PROT 7.2 06/27/2015 1631  ? ALBUMIN 3.9 07/27/2021 0409  ? ALBUMIN 4.1 06/27/2015 1631  ? AST 15 07/27/2021 0409  ? ALT 10 07/27/2021 0409  ? ALKPHOS 62 07/27/2021 0409  ? BILITOT 0.4 07/27/2021 0409  ? BILITOT 0.3 06/27/2015 1631  ? GFRNONAA >60 08/02/2021 0410  ? GFRNONAA >89  07/03/2013 1003  ? GFRAA >60 08/16/2019 1653  ? GFRAA >89 07/03/2013 1003  ? ?Lipase  ?   ?Component Value Date/Time  ? LIPASE 44 07/26/2021 1615  ? ? ? ? ? ?Studies/Results: ?No results found. ? ?Anti-infectives: ?Anti-infectives (From admission, onward)  ? ? None  ? ?  ? ? ? ?Assessment/Plan ?SBO ?- Hx Laparoscopic Gastric Banding by Dr. Excell Seltzer (2010); Laparoscopic appendectomy for perforated appendicitis by Dr. Zella Richer in 2016; exploratory laparotomy with loa for sbo by Dr. Grandville Silos in 2017 and abdominal hysterectomy with salpingectomy and LOA by OBGYN (2019) ?- CT w/ distal sbo without exact transition point visualized but likely in the pelvis ?- SBO protocol with contrast in colon on 8h delay film  ?- Keep K > 4 and Mg > 2 for bowel function ?- continue to mobilize ?- Pt having bowel function but still having some nausea and cramping pain  ?- would recommend continuing reglan, can convert to tablet on discharge ?- consider restarting probiotic, pt takes acidophilus at home. Could consider fiber supplement as well ?- could consider outpatient GI referral if this seems to be more of a gastric motility issue moving forward ?- no indication for acute surgical intervention at this time. If continuing to have bowel function  and tolerating diet, no real indication to remain admitted from a surgical standpoint  ?- I have scheduled her follow up with bariatric surgeon for further management of lap band once she has gotten over more acute symptoms  ?  ?FEN - soft diet, reglan ?VTE - SCDs, okay for chemical prophylaxis from a general surgery standpoint ?ID - None currently ?  ?Umbilical hernia - not the site of transition for obstruction. Reducible.   ? LOS: 7 days  ? ?I reviewed hospitalist notes, last 24 h vitals and pain scores, last 48 h intake and output, and last 24 h labs and trends. ? ? ? ? ?Norm Parcel, PA-C ?[facility] ?08/02/2021, 9:55 AM ?Please see Amion for pager number during day hours  7:00am-4:30pm ? ?

## 2021-08-02 NOTE — Progress Notes (Signed)
Pt is here with SBO, This AM her BP 87/55 pulse 71, She did the same thing  yesterday morning. ?

## 2021-08-02 NOTE — Discharge Summary (Signed)
?Physician Discharge Summary ?  ?Patient: Meagan Dean MRN: [id number] DOB: 1971/02/22  ?Admit date:     07/26/2021  ?Discharge date: 08/02/2021  ?Discharge Physician: Berle Mull  ?PCP: Harvie Heck, MD ? ?Recommendations at discharge: ? Follow up with PCP and will need Gastroenterology referral ? ?Discharge Diagnoses: ?Active Problems: ?  SBO (small bowel obstruction) ([place]) ?  Hypokalemia ?  Diarrhea ?  Numbness and tingling ?  Umbilical hernia ? ? ?Hospital Course: ?Meagan Dean is a 51 y.o. female with a history of small bowel obstruction s/p exploratory laparotomy/lysis of adhesions, hypertension, hyponatremia. Patient presented secondary to abdominal pain and was found to have evidence of small bowel obstruction on CT scan. NG tube placed and general surgery consulted. Diet advanced and NG tube removed on 3/20. ? ?Assessment and Plan: ?SBO (small bowel obstruction) ([place]) ?Patient with a history of bowel obstruction. CT imaging suggests distal small bowel obstruction without exact transition point. NG tube placed and general surgery consulted. NG tube removed overnight secondary to improper positioning. SBO protocol initiated with contrast seen in the colon on abdominal x-ray. ?NG tube remove.  ?Pt started on oral sift diet, which she tolerated.  ?Due to concern with poor motility General surgery started the pt on short term course of Reglan which I will continue on discharge.  ?Outpt referral to GI also recommended. ? ?Hypokalemia ?Patient takes potassium supplementation as an outpatient. Potassium of 3.2 on BMP this morning. NPO at this time. Patient did not tolerate IV potassium and was switched to potassium containing IV fluids. Potassium improved ? ?Diarrhea ?Not significant. No BM in last 24 hours.passing gas. Small lose 1 BM 2 days ago. ? ?AKI (acute kidney injury) ([place]) ?resolved as of 07/27/2021 ?Mild. Resolved with IV fluids. ? ?Umbilical hernia ?Reducible and not the site of transition on CT  scan. ? ?Numbness and tingling ?Bilateral extremities. No neck pain. No associated weakness. Intermittent. Normal vitamin B12. ? ?Hypotension  ?Blood pressure was mildly low.  ?Pt asymptomatic.  ?Orthostatic vitals were stable as well.  ?Pt was able to ambulate without any dizziness or any other symptoms.  ?No further work up.  ? ?Consultants: General surgery ?Procedures performed:  ?none ?DISCHARGE MEDICATION: ?Allergies as of 08/02/2021   ? ?   Reactions  ? Latex Itching  ? Vicodin [hydrocodone-acetaminophen] Other (See Comments)  ? Made her feel "high" ?Has had dilaudid before and tolerated it well  ? ?  ? ?  ?Medication List  ?  ? ?TAKE these medications   ? ?feeding supplement Liqd ?Take 237 mLs by mouth 3 (three) times daily between meals. ?  ?fluticasone furoate-vilanterol 100-25 MCG/INH Aepb ?Commonly known as: BREO ELLIPTA ?Inhale 1 puff into the lungs daily. ?What changed:  ?when to take this ?reasons to take this ?  ?loratadine 10 MG tablet ?Commonly known as: CLARITIN ?Take 10 mg by mouth daily. ?  ?metoCLOPramide 5 MG tablet ?Commonly known as: Reglan ?Take 1 tablet (5 mg total) by mouth 3 (three) times daily before meals. ?  ?multivitamin with minerals Tabs tablet ?Take 1 tablet by mouth daily. ?  ?potassium chloride SA 20 MEQ tablet ?Commonly known as: KLOR-CON M ?Take 1 tablet (20 mEq total) by mouth daily. ?  ?pseudoephedrine 120 MG 12 hr tablet ?Commonly known as: SUDAFED ?Take 120 mg by mouth every 12 (twelve) hours as needed for congestion. ?  ?psyllium 58.6 % packet ?Commonly known as: METAMUCIL ?Take 1 packet by mouth daily as needed (mild constipation). ?  ?  sodium chloride 0.65 % Soln nasal spray ?Commonly known as: OCEAN ?Place 1 spray into both nostrils as needed for congestion. ?  ? ?  ? ? Follow-up Information   ? ? [facility]. Go on 09/05/2021.   ?Specialty: General Surgery ?Why: 2:30 PM with Dr. Johnathan Hausen. Please arrive 30 min prior to appointment time to check-in and  fill out any paperwork. Have your ID and insurance card with you. ?Contact information: ?[address] ?[address] ?[place] [address] ?[phone] ? ? ?  ?  ? ? Harvie Heck, MD. Schedule an appointment as soon as possible for a visit in 1 week(s).   ?Specialty: Internal Medicine ?Why: pt will need GI referral. ?Contact information: ?[address] ?[phone] ?[place] [address] ?[phone] ? ? ?  ?  ? ?  ?  ? ?  ? ?Disposition: Home ?Diet recommendation:  ?Discharge Diet Orders (From admission, onward)  ? ?  Start     Ordered  ? 08/02/21 0000  Diet - low sodium heart healthy       ? 08/02/21 1103  ? ?  ?  ? ?  ? ?Discharge Exam: ?Meagan Dean Weights  ? 07/26/21 2011  ?Weight: 75.8 kg  ? ?General: Appear in mild distress; no visible Abnormal Neck Mass Or lumps, Conjunctiva normal ?Cardiovascular: S1 and S2 Present, no Murmur, ?Respiratory: good respiratory effort, Bilateral Air entry present and CTA, no Crackles, no wheezes ?Abdomen: Bowel Sound present Non tender  ?Extremities: no Pedal edema ?Neurology: alert and oriented to time, place, and person ?Gait not checked due to patient safety concerns  ? ?Condition at discharge: good ? ?The results of significant diagnostics from this hospitalization (including imaging, microbiology, ancillary and laboratory) are listed below for reference.  ? ?Imaging Studies: ?DG Abd 1 View ? ?Result Date: 07/27/2021 ?CLINICAL DATA:  NG tube reposition. EXAM: ABDOMEN - 1 VIEW COMPARISON:  07/26/2021. FINDINGS: There is gaseous distention of the stomach. The small bowel is excluded from the field of view. The enteric tube loops and kinks in the distal esophagus and terminates in the mid esophagus. IMPRESSION: Looping and kinking of the enteric tube in the distal esophagus. Repositioning is recommended. Electronically Signed   By: Brett Fairy M.D.   On: 07/27/2021 01:27  ? ?DG Abd 1 View ? ?Result Date: 07/26/2021 ?CLINICAL DATA:  NG tube placement. Tube was reposition from  earlier placement. EXAM: ABDOMEN - 1 VIEW COMPARISON:  07/26/2021 at 8:31 p.m. FINDINGS: The enteric tube is again demonstrated coiled in the chest, likely in the mid esophagus with the tip projecting at the level of the distal esophagus. The top loop of the tube is kinked. Repositioning is indicated. Gaseous distention of the stomach with gas-filled dilated upper abdominal small bowel and colon. A gastric lap band is present. Shallow inspiration. Lungs are clear. IMPRESSION: Enteric tube remains coiled in the level of the mid esophagus. Repositioning is indicated. Electronically Signed   By: Lucienne Capers M.D.   On: 07/26/2021 23:35  ? ?DG Abdomen 1 View ? ?Result Date: 07/26/2021 ?CLINICAL DATA:  NG tube placement. EXAM: ABDOMEN - 1 VIEW COMPARISON:  07/26/2021. FINDINGS: Distended loops of small bowel are noted in the left upper quadrant measuring up to 4 cm in diameter. No free air. An enteric tube loops and kinks in the mid esophagus and terminates in the distal esophagus. Repositioning is recommended. A gastric lap band is noted. No radiopaque calculi. IMPRESSION: 1. Distended loops of small bowel  in the left upper quadrant, compatible with small-bowel obstruction. 2. There is looping and kinking of the enteric tube in the mid esophagus and the tube terminates in the distal esophagus. Repositioning is recommended. Electronically Signed   By: Brett Fairy M.D.   On: 07/26/2021 20:44  ? ?CT Abdomen Pelvis W Contrast ? ?Result Date: 07/26/2021 ?CLINICAL DATA:  Nausea and vomiting, evaluate for bowel obstruction. EXAM: CT ABDOMEN AND PELVIS WITH CONTRAST TECHNIQUE: Multidetector CT imaging of the abdomen and pelvis was performed using the standard protocol following bolus administration of intravenous contrast. RADIATION DOSE REDUCTION: This exam was performed according to the departmental dose-optimization program which includes automated exposure control, adjustment of the mA and/or kV according to patient  size and/or use of iterative reconstruction technique. CONTRAST:  112m OMNIPAQUE IOHEXOL 300 MG/ML  SOLN COMPARISON:  CT abdomen and pelvis 08/02/2020 FINDINGS: Lower chest: No acute abnormality. Hepatobiliary: There

## 2021-08-02 NOTE — Plan of Care (Signed)
?  Problem: Education: ?Goal: Knowledge of General Education information will improve ?Description: Including pain rating scale, medication(s)/side effects and non-pharmacologic comfort measures ?Outcome: Adequate for Discharge ?  ?Problem: Health Behavior/Discharge Planning: ?Goal: Ability to manage health-related needs will improve ?Outcome: Adequate for Discharge ?  ?Problem: Activity: ?Goal: Risk for activity intolerance will decrease ?Outcome: Adequate for Discharge ?  ?Problem: Nutrition: ?Goal: Adequate nutrition will be maintained ?Outcome: Adequate for Discharge ?  ?Problem: Elimination: ?Goal: Will not experience complications related to bowel motility ?Outcome: Adequate for Discharge ?Goal: Will not experience complications related to urinary retention ?Outcome: Adequate for Discharge ?  ?Problem: Pain Managment: ?Goal: General experience of comfort will improve ?Outcome: Adequate for Discharge ?  ?Problem: Safety: ?Goal: Ability to remain free from injury will improve ?Outcome: Adequate for Discharge ?  ?

## 2021-08-02 NOTE — Progress Notes (Signed)
Pt was discharged home today. Instructions were reviewed with patient, and questions were answered. Pt was taken to main entrance via wheelchair by NT.  

## 2021-08-03 ENCOUNTER — Emergency Department (HOSPITAL_COMMUNITY)
Admission: EM | Admit: 2021-08-03 | Discharge: 2021-08-04 | Disposition: A | Discharge status: Home / Self Care | Payer: Commercial Managed Care - PPO | Attending: Emergency Medicine | Admitting: Emergency Medicine

## 2021-08-03 ENCOUNTER — Other Ambulatory Visit: Payer: Self-pay | Admitting: Internal Medicine

## 2021-08-03 ENCOUNTER — Other Ambulatory Visit: Payer: Self-pay

## 2021-08-03 ENCOUNTER — Emergency Department (HOSPITAL_COMMUNITY): Payer: Commercial Managed Care - PPO

## 2021-08-03 ENCOUNTER — Encounter (HOSPITAL_COMMUNITY): Payer: Self-pay

## 2021-08-03 DIAGNOSIS — I10 Essential (primary) hypertension: Secondary | ICD-10-CM | POA: Insufficient documentation

## 2021-08-03 DIAGNOSIS — R748 Abnormal levels of other serum enzymes: Secondary | ICD-10-CM | POA: Diagnosis not present

## 2021-08-03 DIAGNOSIS — K5903 Drug induced constipation: Secondary | ICD-10-CM | POA: Diagnosis not present

## 2021-08-03 DIAGNOSIS — Z91040 Latex allergy status: Secondary | ICD-10-CM | POA: Insufficient documentation

## 2021-08-03 DIAGNOSIS — Z79899 Other long term (current) drug therapy: Secondary | ICD-10-CM | POA: Diagnosis not present

## 2021-08-03 DIAGNOSIS — J45909 Unspecified asthma, uncomplicated: Secondary | ICD-10-CM | POA: Diagnosis not present

## 2021-08-03 DIAGNOSIS — R109 Unspecified abdominal pain: Secondary | ICD-10-CM | POA: Diagnosis present

## 2021-08-03 NOTE — ED Provider Triage Note (Signed)
Emergency Medicine Provider Triage Evaluation Note ? ?Meagan Dean , a 51 y.o. female  was evaluated in triage.  Pt complains of abdominal pain and distention. Pt was discharged yesterday from [facility] after an 8 day stay for SBO. Per patient, she had surgical repair of an obstruction in 2017, however, this episode, she was being treated conservatively with Reglan. Pt states that she was not feeling better when she was discharged and at the time was not able to eat d/t discomfort, which has not improved with Reglan. She feels her stomach is even more distended and uncomfortable than when she left. She denies diarrhea ? ?Review of Systems  ?Positive: As above ?Negative:  ? ?Physical Exam  ?BP (!) 123/98 (BP Location: Left Arm)   Pulse 80   Temp 98.2 ?F (36.8 ?C) (Oral)   Resp 15   LMP  (LMP Unknown) Comment: 07/2016  SpO2 100%  ?Gen:   Awake, no distress   ?Resp:  Normal effort  ?MSK:   Moves extremities without difficulty  ?Other:  Abdomen is soft, mildly distended and TTP diffusely ? ?Medical Decision Making  ?Medically screening exam initiated at 11:39 PM.  Appropriate orders placed.  Meagan Dean was informed that the remainder of the evaluation will be completed by another provider, this initial triage assessment does not replace that evaluation, and the importance of remaining in the ED until their evaluation is complete. ? ? ?  ?Tonye Pearson, [place] ?08/03/21 2342 ? ?

## 2021-08-03 NOTE — ED Triage Notes (Signed)
Patient discharged yesterday after having a bowel obstruction. Patient has had nonstop abdominal pain since then. Passing gas. No vomiting or diarrhea. Having a hard time eating, no appetite.  ?

## 2021-08-04 ENCOUNTER — Encounter (HOSPITAL_COMMUNITY): Payer: Self-pay

## 2021-08-04 ENCOUNTER — Emergency Department (HOSPITAL_COMMUNITY): Payer: Commercial Managed Care - PPO

## 2021-08-04 LAB — CBC WITH DIFFERENTIAL/PLATELET
Abs Immature Granulocytes: 0.02 10*3/uL (ref 0.00–0.07)
Basophils Absolute: 0 10*3/uL (ref 0.0–0.1)
Basophils Relative: 0 %
Eosinophils Absolute: 0.2 10*3/uL (ref 0.0–0.5)
Eosinophils Relative: 2 %
HCT: 34 % — ABNORMAL LOW (ref 36.0–46.0)
Hemoglobin: 11.1 g/dL — ABNORMAL LOW (ref 12.0–15.0)
Immature Granulocytes: 0 %
Lymphocytes Relative: 35 %
Lymphs Abs: 2.6 10*3/uL (ref 0.7–4.0)
MCH: 28.2 pg (ref 26.0–34.0)
MCHC: 32.6 g/dL (ref 30.0–36.0)
MCV: 86.3 fL (ref 80.0–100.0)
Monocytes Absolute: 0.4 10*3/uL (ref 0.1–1.0)
Monocytes Relative: 6 %
Neutro Abs: 4.2 10*3/uL (ref 1.7–7.7)
Neutrophils Relative %: 57 %
Platelets: 329 10*3/uL (ref 150–400)
RBC: 3.94 MIL/uL (ref 3.87–5.11)
RDW: 13.8 % (ref 11.5–15.5)
WBC: 7.4 10*3/uL (ref 4.0–10.5)
nRBC: 0 % (ref 0.0–0.2)

## 2021-08-04 LAB — URINALYSIS, ROUTINE W REFLEX MICROSCOPIC
Bilirubin Urine: NEGATIVE
Glucose, UA: NEGATIVE mg/dL
Hgb urine dipstick: NEGATIVE
Ketones, ur: NEGATIVE mg/dL
Leukocytes,Ua: NEGATIVE
Nitrite: NEGATIVE
Protein, ur: NEGATIVE mg/dL
Specific Gravity, Urine: 1.039 — ABNORMAL HIGH (ref 1.005–1.030)
pH: 7 (ref 5.0–8.0)

## 2021-08-04 LAB — LIPASE, BLOOD: Lipase: 85 U/L — ABNORMAL HIGH (ref 11–51)

## 2021-08-04 LAB — COMPREHENSIVE METABOLIC PANEL
ALT: 12 U/L (ref 0–44)
AST: 20 U/L (ref 15–41)
Albumin: 3.5 g/dL (ref 3.5–5.0)
Alkaline Phosphatase: 51 U/L (ref 38–126)
Anion gap: 8 (ref 5–15)
BUN: 17 mg/dL (ref 6–20)
CO2: 25 mmol/L (ref 22–32)
Calcium: 8.7 mg/dL — ABNORMAL LOW (ref 8.9–10.3)
Chloride: 105 mmol/L (ref 98–111)
Creatinine, Ser: 0.86 mg/dL (ref 0.44–1.00)
GFR, Estimated: >60 mL/min (ref >60)
Glucose, Bld: 102 mg/dL — ABNORMAL HIGH (ref 70–99)
Potassium: 3.7 mmol/L (ref 3.5–5.1)
Sodium: 138 mmol/L (ref 135–145)
Total Bilirubin: 0.4 mg/dL (ref 0.3–1.2)
Total Protein: 6.7 g/dL (ref 6.5–8.1)

## 2021-08-04 MED ORDER — POLYETHYLENE GLYCOL 3350 17 GM/SCOOP PO POWD: ORAL | 0 refills | Status: DC

## 2021-08-04 MED ORDER — HYDROMORPHONE HCL 1 MG/ML IJ SOLN
0.5000 mg | Freq: Once | INTRAMUSCULAR | Status: AC
  Administered 2021-08-04: 0.5 mg via INTRAVENOUS
  Filled 2021-08-04: qty 1

## 2021-08-04 MED ORDER — SODIUM CHLORIDE 0.9 % IV BOLUS
1000.0000 mL | Freq: Once | INTRAVENOUS | Status: AC
  Administered 2021-08-04: 1000 mL via INTRAVENOUS

## 2021-08-04 MED ORDER — IOHEXOL 300 MG/ML  SOLN
100.0000 mL | Freq: Once | INTRAMUSCULAR | Status: AC | PRN
  Administered 2021-08-04: 100 mL via INTRAVENOUS

## 2021-08-04 NOTE — ED Notes (Signed)
Patient verbalizes understanding of discharge instructions. Opportunity for questioning and answers were provided. Armband removed by staff, pt discharged from ED. Ambulated out to lobby  

## 2021-08-04 NOTE — ED Provider Notes (Signed)
?[facility] DEPT ?Provider Note ? ?CSN: [id number] ?Arrival date & time: 08/03/21 2320 ? ?Chief Complaint(s) ?Abdominal Pain ? ?HPI ?Meagan Dean is a 51 y.o. female with a past medical history listed below including recurrent episodes of small bowel obstruction last of which was earlier this month.  Patient was admitted on 3/16 and discharged on 3/23 after obstruction improved with NG tube.  She returns today for recurrent abdominal pain.  Worse with eating.  Patient still passing gas and having bowel movements.  Reports that she was still having gas and bowel movements with her bowel obstruction.  She feels distended.  No fevers or chills.  No nausea or vomiting.  No other physical complaints. ? ? ?Abdominal Pain ? ?Past Medical History ?Past Medical History:  ?Diagnosis Date  ? Anemia   ? ASCUS with positive high risk HPV cervical 03/15/2017  ? Asthma due to seasonal allergies   ? MILD-- JUST GOT INHALER-not used yet -allergy induced  ? Calculus of ureter 06/16/2013  ? Endometriosis of pelvis   ? Fibroids 09/02/2017  ? GERD (gastroesophageal reflux disease)   ? diet controlled  ? H/O hiatal hernia   ? Headache   ? MIGRAINES - none since weight loss surgery  ? History of endometriosis 03/28/2017  ? History of hypertension   ? NO ISSUES SINCE WT LOSS AFTER GASTRIC BANDING  ? History of kidney stones   ? surgery to remove  ? History of obstructive sleep apnea   ? DX'D PRIOR TO GASTRIC BANDING  2010 --  NO ISSUES SINCE WT LOSS  ? Hypertension 03/28/2017  ? Low potassium syndrome   ? hopsitalized for 3 days  ? Right ureteral stone   ? Seasonal allergies   ? Sinus headache   ? Sleep apnea   ? prior to weight loss surgery, does not have cpap  ? Urgency of urination   ? ?Patient Active Problem List  ? Diagnosis Date Noted  ? Numbness and tingling 07/27/2021  ? Umbilical hernia 74/16/3845  ? SBO (small bowel obstruction) ([place]) 07/26/2021  ? Cramping of feet 06/22/2021  ? Polyuria 05/22/2021  ?  Fatigue 08/12/2019  ? Asthma due to seasonal allergies 08/18/2018  ? History of endometriosis 03/28/2017  ? Hypertension 03/28/2017  ? Abdominal pain in female 03/21/2017  ? Diarrhea 12/15/2015  ? Partial small bowel obstruction ([place]) 11/17/2015  ? Routine health maintenance 04/03/2015  ? Hypokalemia 06/17/2013  ? ?Home Medication(s) ?Prior to Admission medications   ?Medication Sig Start Date End Date Taking? Authorizing Provider  ?polyethylene glycol powder (MIRALAX) 17 GM/SCOOP powder Please start taking 1 capful 3 times a day for 2-3 days. Slowly cut back (one capful at a time) as needed until you have normal bowel movements. 08/04/21  Yes Ori Kreiter, Grayce Sessions, MD  ?feeding supplement (ENSURE ENLIVE / ENSURE PLUS) LIQD Take 237 mLs by mouth 3 (three) times daily between meals. 08/02/21   Lavina Hamman, MD  ?fluticasone furoate-vilanterol (BREO ELLIPTA) 100-25 MCG/INH AEPB Inhale 1 puff into the lungs daily. ?Patient taking differently: Inhale 1 puff into the lungs daily as needed (wheezing). 08/18/18   Katherine Roan, MD  ?loratadine (CLARITIN) 10 MG tablet Take 10 mg by mouth daily.    [provider]  ?metoCLOPramide (REGLAN) 5 MG tablet Take 1 tablet (5 mg total) by mouth 3 (three) times daily before meals. 08/02/21 08/02/22  Lavina Hamman, MD  ?Multiple Vitamin (MULTIVITAMIN WITH MINERALS) TABS tablet Take 1 tablet by  mouth daily. 08/02/21   Lavina Hamman, MD  ?potassium chloride SA (KLOR-CON M) 20 MEQ tablet Take 1 tablet (20 mEq total) by mouth daily. 07/12/21   Harvie Heck, MD  ?pseudoephedrine (SUDAFED) 120 MG 12 hr tablet Take 120 mg by mouth every 12 (twelve) hours as needed for congestion.    [provider]  ?psyllium (METAMUCIL) 58.6 % packet Take 1 packet by mouth daily as needed (mild constipation). 08/02/21   Lavina Hamman, MD  ?sodium chloride (OCEAN) 0.65 % SOLN nasal spray Place 1 spray into both nostrils as needed for congestion.    [provider]  ?                                                                                                                                   ?Allergies ?Latex and Vicodin [hydrocodone-acetaminophen] ? ?Review of Systems ?Review of Systems  ?Gastrointestinal:  Positive for abdominal pain.  ?As noted in HPI ? ?Physical Exam ?Vital Signs  ?I have reviewed the triage vital signs ?BP 121/80   Pulse 94   Temp 98.2 ?F (36.8 ?C) (Oral)   Resp 18   LMP  (LMP Unknown) Comment: 07/2016  SpO2 100%  ? ?Physical Exam ?Vitals reviewed.  ?Constitutional:   ?   General: She is not in acute distress. ?   Appearance: She is well-developed. She is not diaphoretic.  ?HENT:  ?   Head: Normocephalic and atraumatic.  ?   Right Ear: External ear normal.  ?   Left Ear: External ear normal.  ?   Nose: Nose normal.  ?Eyes:  ?   General: No scleral icterus. ?   Conjunctiva/sclera: Conjunctivae normal.  ?Neck:  ?   Trachea: Phonation normal.  ?Cardiovascular:  ?   Rate and Rhythm: Normal rate and regular rhythm.  ?Pulmonary:  ?   Effort: Pulmonary effort is normal. No respiratory distress.  ?   Breath sounds: No stridor.  ?Abdominal:  ?   General: There is no distension.  ?   Tenderness: There is abdominal tenderness in the right lower quadrant, periumbilical area, suprapubic area and left lower quadrant. There is no guarding or rebound.  ?Musculoskeletal:     ?   General: Normal range of motion.  ?   Cervical back: Normal range of motion.  ?Neurological:  ?   Mental Status: She is alert and oriented to person, place, and time.  ?Psychiatric:     ?   Behavior: Behavior normal.  ? ? ?ED Results and Treatments ?Labs ?(all labs ordered are listed, but only abnormal results are displayed) ?Labs Reviewed  ?CBC WITH DIFFERENTIAL/PLATELET - Abnormal; Notable for the following components:  ?    Result Value  ? Hemoglobin 11.1 (*)   ? HCT 34.0 (*)   ? All other components within normal limits  ?COMPREHENSIVE METABOLIC PANEL - Abnormal; Notable for the following  components:  ? Glucose,  Bld 102 (*)   ? Calcium 8.7 (*)   ? All other components within normal limits  ?LIPASE, BLOOD - Abnormal; Notable for the following components:  ? Lipase 85 (*)   ? All other components within normal limits  ?URINALYSIS, ROUTINE W REFLEX MICROSCOPIC - Abnormal; Notable for the following components:  ? Color, Urine STRAW (*)   ? Specific Gravity, Urine 1.039 (*)   ? All other components within normal limits  ?                                                                                                                       ?EKG ? EKG Interpretation ? ?Date/Time:    ?Ventricular Rate:    ?PR Interval:    ?QRS Duration:   ?QT Interval:    ?QTC Calculation:   ?R Axis:     ?Text Interpretation:   ?  ? ?  ? ?Radiology ?CT ABDOMEN PELVIS W CONTRAST ? ?Result Date: 08/04/2021 ?CLINICAL DATA:  History of bowel obstruction with diffuse abdominal pain, subsequent encounter EXAM: CT ABDOMEN AND PELVIS WITH CONTRAST TECHNIQUE: Multidetector CT imaging of the abdomen and pelvis was performed using the standard protocol following bolus administration of intravenous contrast. RADIATION DOSE REDUCTION: This exam was performed according to the departmental dose-optimization program which includes automated exposure control, adjustment of the mA and/or kV according to patient size and/or use of iterative reconstruction technique. CONTRAST:  143m OMNIPAQUE IOHEXOL 300 MG/ML  SOLN COMPARISON:  07/26/2021, plain film from the previous day. FINDINGS: Lower chest: No acute abnormality. Hepatobiliary: No focal liver abnormality is seen. No gallstones, gallbladder wall thickening, or biliary dilatation. Pancreas: Unremarkable. No pancreatic ductal dilatation or surrounding inflammatory changes. Spleen: Normal in size without focal abnormality. Adrenals/Urinary Tract: Adrenal glands are within normal limits. Kidneys demonstrate a normal enhancement pattern bilaterally. No obstructive changes are seen. The bladder is  partially distended. Stomach/Bowel: No obstructive or inflammatory changes of the colon are seen. The appendix has been surgically removed. Small bowel is not significantly dilated when compare with the prior CT exa

## 2021-09-01 ENCOUNTER — Other Ambulatory Visit: Payer: Self-pay | Admitting: Internal Medicine

## 2021-09-03 ENCOUNTER — Encounter (HOSPITAL_COMMUNITY): Payer: Self-pay | Admitting: Emergency Medicine

## 2021-09-03 ENCOUNTER — Emergency Department (HOSPITAL_COMMUNITY): Payer: Commercial Managed Care - PPO

## 2021-09-03 ENCOUNTER — Other Ambulatory Visit: Payer: Self-pay

## 2021-09-03 ENCOUNTER — Emergency Department (HOSPITAL_COMMUNITY)
Admission: EM | Admit: 2021-09-03 | Discharge: 2021-09-04 | Disposition: A | Discharge status: Home / Self Care | Payer: Commercial Managed Care - PPO | Attending: Emergency Medicine | Admitting: Emergency Medicine

## 2021-09-03 DIAGNOSIS — S20219A Contusion of unspecified front wall of thorax, initial encounter: Secondary | ICD-10-CM | POA: Insufficient documentation

## 2021-09-03 DIAGNOSIS — I10 Essential (primary) hypertension: Secondary | ICD-10-CM | POA: Insufficient documentation

## 2021-09-03 DIAGNOSIS — J45909 Unspecified asthma, uncomplicated: Secondary | ICD-10-CM | POA: Diagnosis not present

## 2021-09-03 DIAGNOSIS — R220 Localized swelling, mass and lump, head: Secondary | ICD-10-CM | POA: Diagnosis not present

## 2021-09-03 DIAGNOSIS — S299XXA Unspecified injury of thorax, initial encounter: Secondary | ICD-10-CM | POA: Diagnosis present

## 2021-09-03 DIAGNOSIS — Y92410 Unspecified street and highway as the place of occurrence of the external cause: Secondary | ICD-10-CM | POA: Insufficient documentation

## 2021-09-03 DIAGNOSIS — Z91040 Latex allergy status: Secondary | ICD-10-CM | POA: Insufficient documentation

## 2021-09-03 DIAGNOSIS — M7989 Other specified soft tissue disorders: Secondary | ICD-10-CM | POA: Diagnosis not present

## 2021-09-03 MED ORDER — OXYCODONE-ACETAMINOPHEN 5-325 MG PO TABS
1.0000 | ORAL_TABLET | Freq: Once | ORAL | Status: AC
  Administered 2021-09-03: 1 via ORAL
  Filled 2021-09-03: qty 1
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
## 2021-09-04 ENCOUNTER — Emergency Department (HOSPITAL_COMMUNITY): Payer: Commercial Managed Care - PPO

## 2021-09-04 DIAGNOSIS — S20219A Contusion of unspecified front wall of thorax, initial encounter: Secondary | ICD-10-CM | POA: Diagnosis not present

## 2021-09-04 LAB — I-STAT CHEM 8, ED
BUN: 8 mg/dL (ref 6–20)
Calcium, Ion: 1.18 mmol/L (ref 1.15–1.40)
Chloride: 103 mmol/L (ref 98–111)
Creatinine, Ser: 0.8 mg/dL (ref 0.44–1.00)
Glucose, Bld: 87 mg/dL (ref 70–99)
HCT: 36 % (ref 36.0–46.0)
Hemoglobin: 12.2 g/dL (ref 12.0–15.0)
Potassium: 3.8 mmol/L (ref 3.5–5.1)
Sodium: 140 mmol/L (ref 135–145)
TCO2: 27 mmol/L (ref 22–32)

## 2021-09-04 MED ORDER — NAPROXEN 500 MG PO TABS: 500.0000 mg | ORAL_TABLET | Freq: Two times a day (BID) | ORAL | 0 refills | Status: DC

## 2021-09-04 MED ORDER — KETOROLAC TROMETHAMINE 30 MG/ML IJ SOLN
30.0000 mg | Freq: Once | INTRAMUSCULAR | Status: AC
  Administered 2021-09-04: 30 mg via INTRAMUSCULAR
  Filled 2021-09-04: qty 1

## 2021-09-04 MED ORDER — CYCLOBENZAPRINE HCL 5 MG PO TABS: 10.0000 mg | ORAL_TABLET | Freq: Two times a day (BID) | ORAL | 0 refills | Status: DC | PRN

## 2021-09-04 NOTE — ED Notes (Signed)
Patient verbalizes understanding of d/c instructions. Opportunities for questions and answers were provided. Pt d/c from ED and ambulated to lobby.  
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
## 2021-09-20 ENCOUNTER — Other Ambulatory Visit: Payer: Self-pay

## 2021-09-20 ENCOUNTER — Encounter: Payer: Self-pay | Admitting: Student

## 2021-09-20 ENCOUNTER — Ambulatory Visit (INDEPENDENT_AMBULATORY_CARE_PROVIDER_SITE_OTHER): Payer: Commercial Managed Care - PPO | Admitting: Student

## 2021-09-20 DIAGNOSIS — M79643 Pain in unspecified hand: Secondary | ICD-10-CM

## 2021-09-20 DIAGNOSIS — R079 Chest pain, unspecified: Secondary | ICD-10-CM
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
## 2021-09-24 NOTE — Progress Notes (Signed)
Internal Medicine Clinic Attending ? ?Case discussed with Dr. Lisabeth Devoid  At the time of the visit.  We reviewed the resident?s history and exam and pertinent patient test results.  I agree with the assessment, diagnosis, and plan of care documented in the resident?s note.  ? ? ?Note: the diastolic BP was entered in error  ?

## 2021-09-26 ENCOUNTER — Other Ambulatory Visit: Payer: Self-pay | Admitting: Internal Medicine

## 2021-10-06 ENCOUNTER — Emergency Department (HOSPITAL_COMMUNITY)
Admission: EM | Admit: 2021-10-06 | Discharge: 2021-10-07 | Disposition: A | Discharge status: Home / Self Care | Payer: Commercial Managed Care - PPO | Attending: Emergency Medicine | Admitting: Emergency Medicine

## 2021-10-06 ENCOUNTER — Encounter (HOSPITAL_COMMUNITY): Payer: Self-pay

## 2021-10-06 ENCOUNTER — Other Ambulatory Visit: Payer: Self-pay

## 2021-10-06 ENCOUNTER — Emergency Department (HOSPITAL_COMMUNITY): Payer: Commercial Managed Care - PPO

## 2021-10-06 DIAGNOSIS — R112 Nausea with vomiting, unspecified: Secondary | ICD-10-CM

## 2021-10-06 DIAGNOSIS — K5900 Constipation, unspecified: Secondary | ICD-10-CM | POA: Diagnosis not present

## 2021-10-06 DIAGNOSIS — N9489 Other specified conditions associated with female genital organs and menstrual cycle: Secondary | ICD-10-CM | POA: Diagnosis not present

## 2021-10-06 DIAGNOSIS — R1084 Generalized abdominal pain: Secondary | ICD-10-CM | POA: Diagnosis present

## 2021-10-06 DIAGNOSIS — Z91040 Latex allergy status: Secondary | ICD-10-CM | POA: Insufficient documentation

## 2021-10-06 LAB — LACTIC ACID, PLASMA: Lactic Acid, Venous: 0.8 mmol/L (ref 0.5–1.9)

## 2021-10-06 LAB — LIPASE, BLOOD: Lipase: 61 U/L — ABNORMAL HIGH (ref 11–51)

## 2021-10-06 LAB — CBC WITH DIFFERENTIAL/PLATELET
Abs Immature Granulocytes: 0.01 10*3/uL (ref 0.00–0.07)
Basophils Absolute: 0 10*3/uL (ref 0.0–0.1)
Basophils Relative: 0 %
Eosinophils Absolute: 0.1 10*3/uL (ref 0.0–0.5)
Eosinophils Relative: 2 %
HCT: 39.1 % (ref 36.0–46.0)
Hemoglobin: 13 g/dL (ref 12.0–15.0)
Immature Granulocytes: 0 %
Lymphocytes Relative: 43 %
Lymphs Abs: 2.9 10*3/uL (ref 0.7–4.0)
MCH: 28 pg (ref 26.0–34.0)
MCHC: 33.2 g/dL (ref 30.0–36.0)
MCV: 84.1 fL (ref 80.0–100.0)
Monocytes Absolute: 0.4 10*3/uL (ref 0.1–1.0)
Monocytes Relative: 5 %
Neutro Abs: 3.3 10*3/uL (ref 1.7–7.7)
Neutrophils Relative %: 50 %
Platelets: 297 10*3/uL (ref 150–400)
RBC: 4.65 MIL/uL (ref 3.87–5.11)
RDW: 13.9 % (ref 11.5–15.5)
WBC: 6.8 10*3/uL (ref 4.0–10.5)
nRBC: 0 % (ref 0.0–0.2)

## 2021-10-06 LAB — COMPREHENSIVE METABOLIC PANEL
ALT: 11 U/L (ref 0–44)
AST: 17 U/L (ref 15–41)
Albumin: 4.2 g/dL (ref 3.5–5.0)
Alkaline Phosphatase: 87 U/L (ref 38–126)
Anion gap: 7 (ref 5–15)
BUN: 22 mg/dL — ABNORMAL HIGH (ref 6–20)
CO2: 28 mmol/L (ref 22–32)
Calcium: 9.1 mg/dL (ref 8.9–10.3)
Chloride: 105 mmol/L (ref 98–111)
Creatinine, Ser: 0.8 mg/dL (ref 0.44–1.00)
GFR, Estimated: >60 mL/min (ref >60)
Glucose, Bld: 106 mg/dL — ABNORMAL HIGH (ref 70–99)
Potassium: 2.7 mmol/L — CL (ref 3.5–5.1)
Sodium: 140 mmol/L (ref 135–145)
Total Bilirubin: 0.7 mg/dL (ref 0.3–1.2)
Total Protein: 7.9 g/dL (ref 6.5–8.1)

## 2021-10-06 MED ORDER — IOHEXOL 300 MG/ML  SOLN
100.0000 mL | Freq: Once | INTRAMUSCULAR | Status: AC | PRN
  Administered 2021-10-07: 100 mL via INTRAVENOUS

## 2021-10-06 MED ORDER — POTASSIUM CHLORIDE 10 MEQ/100ML IV SOLN
10.0000 meq | INTRAVENOUS | Status: AC
  Administered 2021-10-06 – 2021-10-07 (×3): 10 meq via INTRAVENOUS
  Filled 2021-10-06 (×3): qty 100

## 2021-10-06 MED ORDER — ONDANSETRON HCL 4 MG/2ML IJ SOLN
4.0000 mg | Freq: Once | INTRAMUSCULAR | Status: AC
  Administered 2021-10-06: 4 mg via INTRAVENOUS
  Filled 2021-10-06: qty 2

## 2021-10-06 MED ORDER — MORPHINE SULFATE (PF) 4 MG/ML IV SOLN
4.0000 mg | Freq: Once | INTRAVENOUS | Status: AC
  Administered 2021-10-06: 4 mg via INTRAVENOUS
  Filled 2021-10-06: qty 1

## 2021-10-06 NOTE — ED Provider Notes (Signed)
[facility] DEPT Provider Note   CSN: [id number] Arrival date & time: 10/06/21  2114     History  Chief Complaint  Patient presents with   Abdominal Pain    GENNETT GARCIA is a 51 y.o. female.  The history is provided by the patient and medical records. No language interpreter was used.  Abdominal Pain Pain location:  Generalized Pain quality: aching, cramping and sharp   Pain radiates to:  Does not radiate Pain severity:  Severe Onset quality:  Sudden Duration:  3 hours Timing:  Constant Progression:  Waxing and waning Chronicity:  Recurrent Context: previous surgery   Context: not trauma   Relieved by:  Nothing Worsened by:  Nothing Ineffective treatments:  None tried Associated symptoms: constipation, nausea and vomiting   Associated symptoms: no chest pain, no chills, no cough, no diarrhea, no dysuria, no fatigue, no fever, no shortness of breath, no vaginal bleeding and no vaginal discharge   Risk factors: multiple surgeries       Home Medications Prior to Admission medications   Medication Sig Start Date End Date Taking? Authorizing Provider  cyclobenzaprine (FLEXERIL) 5 MG tablet Take 2 tablets (10 mg total) by mouth 2 (two) times daily as needed for muscle spasms. 09/04/21   Horton, Barbette Hair, MD  feeding supplement (ENSURE ENLIVE / ENSURE PLUS) LIQD Take 237 mLs by mouth 3 (three) times daily between meals. 08/02/21   Lavina Hamman, MD  fluticasone furoate-vilanterol (BREO ELLIPTA) 100-25 MCG/INH AEPB Inhale 1 puff into the lungs daily. Patient taking differently: Inhale 1 puff into the lungs daily as needed (wheezing). 08/18/18   Katherine Roan, MD  KLOR-CON M20 20 MEQ tablet TAKE 1 TABLET BY MOUTH EVERY DAY 09/26/21   Harvie Heck, MD  loratadine (CLARITIN) 10 MG tablet Take 10 mg by mouth daily.    [provider]  metoCLOPramide (REGLAN) 5 MG tablet Take 1 tablet (5 mg total) by mouth 3 (three) times daily before  meals. 08/02/21 08/02/22  Lavina Hamman, MD  Multiple Vitamin (MULTIVITAMIN WITH MINERALS) TABS tablet Take 1 tablet by mouth daily. 08/02/21   Lavina Hamman, MD  naproxen (NAPROSYN) 500 MG tablet Take 1 tablet (500 mg total) by mouth 2 (two) times daily. 09/04/21   Horton, Barbette Hair, MD  polyethylene glycol powder (MIRALAX) 17 GM/SCOOP powder Please start taking 1 capful 3 times a day for 2-3 days. Slowly cut back (one capful at a time) as needed until you have normal bowel movements. 08/04/21   Fatima Blank, MD  pseudoephedrine (SUDAFED) 120 MG 12 hr tablet Take 120 mg by mouth every 12 (twelve) hours as needed for congestion.    [provider]  psyllium (METAMUCIL) 58.6 % packet Take 1 packet by mouth daily as needed (mild constipation). 08/02/21   Lavina Hamman, MD  sodium chloride (OCEAN) 0.65 % SOLN nasal spray Place 1 spray into both nostrils as needed for congestion.    [provider]      Allergies    Latex and Vicodin [hydrocodone-acetaminophen]    Review of Systems   Review of Systems  Constitutional:  Negative for chills, diaphoresis, fatigue and fever.  HENT:  Negative for congestion.   Respiratory:  Negative for cough, chest tightness, shortness of breath and wheezing.   Cardiovascular:  Negative for chest pain, palpitations and leg swelling.  Gastrointestinal:  Positive for abdominal pain, constipation, nausea and vomiting. Negative for diarrhea.  Genitourinary:  Negative for  dysuria, flank pain, vaginal bleeding and vaginal discharge.  Musculoskeletal:  Negative for back pain, neck pain and neck stiffness.  Skin:  Negative for rash and wound.  Neurological:  Negative for light-headedness and headaches.  Psychiatric/Behavioral:  Negative for agitation.   All other systems reviewed and are negative.  Physical Exam Updated Vital Signs BP (!) 166/99   Pulse 99   Temp 98.3 F (36.8 C) (Oral)   Resp 16   LMP  (LMP Unknown) Comment: 07/2016   SpO2 100%  Physical Exam Vitals and nursing note reviewed.  Constitutional:      General: She is not in acute distress.    Appearance: She is well-developed. She is not ill-appearing, toxic-appearing or diaphoretic.  HENT:     Head: Normocephalic and atraumatic.     Mouth/Throat:     Mouth: Mucous membranes are moist.  Eyes:     Conjunctiva/sclera: Conjunctivae normal.  Cardiovascular:     Rate and Rhythm: Normal rate and regular rhythm.     Heart sounds: No murmur heard. Pulmonary:     Effort: Pulmonary effort is normal. No respiratory distress.     Breath sounds: Normal breath sounds.  Abdominal:     General: Abdomen is flat. Bowel sounds are normal. There is no distension.     Palpations: Abdomen is soft.     Tenderness: There is abdominal tenderness. There is no right CVA tenderness, left CVA tenderness, guarding or rebound.     Comments: Did not appear distended on exam but patient feels it might be slightly so  Musculoskeletal:        General: No swelling.     Cervical back: Neck supple.  Skin:    General: Skin is warm and dry.     Capillary Refill: Capillary refill takes less than 2 seconds.     Coloration: Skin is not pale.     Findings: No rash.  Neurological:     General: No focal deficit present.     Mental Status: She is alert.    ED Results / Procedures / Treatments   Labs (all labs ordered are listed, but only abnormal results are displayed) Labs Reviewed  COMPREHENSIVE METABOLIC PANEL - Abnormal; Notable for the following components:      Result Value   Potassium 2.7 (*)    Glucose, Bld 106 (*)    BUN 22 (*)    All other components within normal limits  LIPASE, BLOOD - Abnormal; Notable for the following components:   Lipase 61 (*)    All other components within normal limits  CBC WITH DIFFERENTIAL/PLATELET  LACTIC ACID, PLASMA  URINALYSIS, ROUTINE W REFLEX MICROSCOPIC  LACTIC ACID, PLASMA  I-STAT BETA HCG BLOOD, ED ([facility], [facility], [facility] ONLY)     EKG None  Radiology No results found.  Procedures Procedures    Medications Ordered in ED Medications  potassium chloride 10 mEq in 100 mL IVPB (has no administration in time range)  ondansetron (ZOFRAN) injection 4 mg (4 mg Intravenous Given 10/06/21 2145)  morphine (PF) 4 MG/ML injection 4 mg (4 mg Intravenous Given 10/06/21 2146)    ED Course/ Medical Decision Making/ A&P                           Medical Decision Making Amount and/or Complexity of Data Reviewed Labs: ordered. Radiology: ordered.  Risk Prescription drug management.    ADALAY AZUCENA is a 51 y.o. female with a past  medical history significant for hypertension, endometriosis, kidney stones, gastric banding, and bowel obstructions who presents with nausea, vomiting, abdominal distention, abdominal pain, and no bowel movement for 3 days.  Patient reports that she has not had a bowel movement 3 days which can happen at times however today started having severe pain across her abdomen.  She feels like it is "balling up" and feels like previous bowel obstruction.  She reports she feels a knot in her abdomen and has had nausea and vomiting after she tried to eat before arrival.  She reports she has not passed gas since the pain began.  She denies any current fevers, chills, chest pain, shortness of breath.  Denies any urinary changes.  Denies any trauma.  Reports the pain is 9 out of 10 in severity.  On exam, lungs clear and chest nontender.  Abdomen is diffusely tender and I did not appreciate bowel sounds.  I did feel a small knot in her subcutaneous tissues that could be a small hernia but it was not reducible and was only mildly tender.  She did not have any rash seen.  Otherwise exam unremarkable.  We will give pain medicine, nausea medicine, and she will be n.p.o.  We will get labs and CT scan given her history of multiple bowel obstructions that feels similar.  Anticipate reassessment after work-up.  Care  will be transferred to oncoming team to await results of CT imaging.        Final Clinical Impression(s) / ED Diagnoses Final diagnoses:  Generalized abdominal pain  Nausea and vomiting, unspecified vomiting type    Clinical Impression: 1. Generalized abdominal pain   2. Nausea and vomiting, unspecified vomiting type     Disposition: Care transferred to oncoming team to await results of work-up and CT imaging to rule out bowel obstruction  This note was prepared with assistance of Dragon voice recognition software. Occasional wrong-word or sound-a-like substitutions may have occurred due to the inherent limitations of voice recognition software.       Viviane Semidey, Gwenyth Allegra, MD 10/06/21 [phone]

## 2021-10-06 NOTE — ED Triage Notes (Signed)
Pt states that her intestines were balling up today and she vomited. Pt has a hx of bowel obstruction. She reports not having a bowel movement in 3 days.

## 2021-10-07 LAB — URINALYSIS, ROUTINE W REFLEX MICROSCOPIC
Bilirubin Urine: NEGATIVE
Glucose, UA: NEGATIVE mg/dL
Hgb urine dipstick: NEGATIVE
Ketones, ur: NEGATIVE mg/dL
Leukocytes,Ua: NEGATIVE
Nitrite: NEGATIVE
Protein, ur: NEGATIVE mg/dL
Specific Gravity, Urine: 1.015 (ref 1.005–1.030)
pH: 6 (ref 5.0–8.0)

## 2021-10-07 MED ORDER — METOCLOPRAMIDE HCL 5 MG/ML IJ SOLN
10.0000 mg | Freq: Once | INTRAMUSCULAR | Status: AC
  Administered 2021-10-07: 10 mg via INTRAVENOUS
  Filled 2021-10-07: qty 2

## 2021-10-07 MED ORDER — ONDANSETRON 4 MG PO TBDP: ORAL_TABLET | ORAL | 0 refills | Status: DC

## 2021-10-07 NOTE — ED Provider Notes (Signed)
Patient signed out to me by Dr. Sherry Ruffing.  Patient initially seen for abdominal pain.  Patient had associated nausea and vomiting.  Patient treated with fluids, antiemetics, improved.  CT scan does not show any acute abnormality.  Urinalysis has finally returned, no evidence of UTI.  Discharged with symptomatic treatment.   Orpah Greek, MD 10/07/21 [phone]

## 2021-10-22 ENCOUNTER — Ambulatory Visit: Payer: Commercial Managed Care - PPO | Admitting: *Deleted

## 2021-10-22 DIAGNOSIS — Z111 Encounter for screening for respiratory tuberculosis: Secondary | ICD-10-CM

## 2021-10-24 LAB — TB SKIN TEST
Induration: 0 mm
TB Skin Test: NEGATIVE

## 2021-10-31 ENCOUNTER — Other Ambulatory Visit: Payer: Self-pay | Admitting: Internal Medicine

## 2021-11-16 ENCOUNTER — Other Ambulatory Visit: Payer: Self-pay | Admitting: Internal Medicine

## 2021-12-24 ENCOUNTER — Encounter (HOSPITAL_COMMUNITY): Payer: Self-pay | Admitting: Internal Medicine

## 2021-12-24 ENCOUNTER — Telehealth: Payer: Self-pay | Admitting: *Deleted

## 2021-12-24 ENCOUNTER — Ambulatory Visit (INDEPENDENT_AMBULATORY_CARE_PROVIDER_SITE_OTHER): Payer: Commercial Managed Care - PPO | Admitting: Student

## 2021-12-24 ENCOUNTER — Encounter (HOSPITAL_COMMUNITY): Payer: Self-pay

## 2021-12-24 ENCOUNTER — Observation Stay (HOSPITAL_COMMUNITY)
Admission: RE | Admit: 2021-12-24 | Discharge: 2021-12-25 | Disposition: A | Discharge status: Home / Self Care | Payer: Commercial Managed Care - PPO | Source: Ambulatory Visit | Attending: Internal Medicine | Admitting: Internal Medicine

## 2021-12-24 ENCOUNTER — Other Ambulatory Visit: Payer: Self-pay

## 2021-12-24 ENCOUNTER — Encounter: Payer: Self-pay | Admitting: Student

## 2021-12-24 VITALS — BP 118/80 | HR 85 | Temp 98.3°F | Ht 63.0 in | Wt 163.2 lb

## 2021-12-24 DIAGNOSIS — E876 Hypokalemia: Principal | ICD-10-CM | POA: Diagnosis present

## 2021-12-24 DIAGNOSIS — Z0000 Encounter for general adult medical examination without abnormal findings: Secondary | ICD-10-CM

## 2021-12-24 DIAGNOSIS — R5383 Other fatigue: Secondary | ICD-10-CM

## 2021-12-24 DIAGNOSIS — I10 Essential (primary) hypertension: Secondary | ICD-10-CM | POA: Insufficient documentation

## 2021-12-24 DIAGNOSIS — J45909 Unspecified asthma, uncomplicated: Secondary | ICD-10-CM | POA: Insufficient documentation

## 2021-12-24 DIAGNOSIS — Z91040 Latex allergy status: Secondary | ICD-10-CM | POA: Diagnosis not present

## 2021-12-24 DIAGNOSIS — Z79899 Other long term (current) drug therapy: Secondary | ICD-10-CM | POA: Insufficient documentation

## 2021-12-24 LAB — BASIC METABOLIC PANEL
Anion gap: 9 (ref 5–15)
BUN: 9 mg/dL (ref 6–20)
CO2: 27 mmol/L (ref 22–32)
Calcium: 9.2 mg/dL (ref 8.9–10.3)
Chloride: 104 mmol/L (ref 98–111)
Creatinine, Ser: 0.78 mg/dL (ref 0.44–1.00)
GFR, Estimated: >60 mL/min (ref >60)
Glucose, Bld: 86 mg/dL (ref 70–99)
Potassium: 2.4 mmol/L — CL (ref 3.5–5.1)
Sodium: 140 mmol/L (ref 135–145)

## 2021-12-24 LAB — GLUCOSE, CAPILLARY: Glucose-Capillary: 78 mg/dL (ref 70–99)

## 2021-12-24 LAB — POCT GLYCOSYLATED HEMOGLOBIN (HGB A1C): Hemoglobin A1C: 5.3 % (ref 4.0–5.6)

## 2021-12-24 MED ORDER — ORAL CARE MOUTH RINSE
15.0000 mL | OROMUCOSAL | Status: DC | PRN
Start: 2021-12-24 — End: 2021-12-25

## 2021-12-24 MED ORDER — ENOXAPARIN SODIUM 40 MG/0.4ML IJ SOSY
40.0000 mg | PREFILLED_SYRINGE | Freq: Every day | INTRAMUSCULAR | Status: DC
  Administered 2021-12-25: 40 mg via SUBCUTANEOUS
  Filled 2021-12-24: qty 0.4

## 2021-12-24 MED ORDER — ONDANSETRON HCL 4 MG PO TABS: 4.0000 mg | ORAL_TABLET | Freq: Four times a day (QID) | ORAL | Status: DC | PRN

## 2021-12-24 MED ORDER — POTASSIUM CHLORIDE CRYS ER 20 MEQ PO TBCR
40.0000 meq | EXTENDED_RELEASE_TABLET | Freq: Two times a day (BID) | ORAL | Status: DC
Start: 2021-12-25 — End: 2021-12-25
  Administered 2021-12-24 – 2021-12-25 (×2): 40 meq via ORAL
  Filled 2021-12-24 (×2): qty 2

## 2021-12-24 MED ORDER — ACETAMINOPHEN 325 MG PO TABS: 650.0000 mg | ORAL_TABLET | Freq: Four times a day (QID) | ORAL | Status: DC | PRN

## 2021-12-24 MED ORDER — POTASSIUM CHLORIDE CRYS ER 20 MEQ PO TBCR: 20.0000 meq | EXTENDED_RELEASE_TABLET | Freq: Every day | ORAL | 1 refills | Status: DC

## 2021-12-24 MED ORDER — ACETAMINOPHEN 650 MG RE SUPP: 650.0000 mg | Freq: Four times a day (QID) | RECTAL | Status: DC | PRN

## 2021-12-24 MED ORDER — ONDANSETRON HCL 4 MG/2ML IJ SOLN
4.0000 mg | Freq: Four times a day (QID) | INTRAMUSCULAR | Status: DC | PRN
  Administered 2021-12-25 (×2): 4 mg via INTRAVENOUS
  Filled 2021-12-24 (×2): qty 2

## 2021-12-24 NOTE — Telephone Encounter (Signed)
Received call from [facility] with [facility] reporting critical potassium 2.4.

## 2021-12-24 NOTE — Patient Instructions (Signed)
Thank you, Ms.[name] for allowing [place] to provide your care today. Today we discussed your low potassium and your health maintenance. We will call you back with the results; if they are low but not critical, we will send the presciption to your pharmacy. If they are critical, we would call you anyway and send you to the ED as you will need care.    I have ordered the following labs for you:   Lab Orders         BMP8+Anion Gap         Lipid Profile         POC Hbg A1C      I will call if any are abnormal. All of your labs can be accessed through "My Chart".  I have place a referrals to colonoscopy and mammogram. They will call you once your insurance has approved it.  My Chart Access: [email or web address]  Please follow-up in in the next few weeks as needed.  Please make sure to arrive 15 minutes prior to your next appointment. If you arrive late, you may be asked to reschedule.    We look forward to seeing you next time. Please call our clinic at [phone] if you have any questions or concerns. The best time to call is Monday-Friday from 9am-4pm, but there is someone available 24/7. If after hours or the weekend, call the main hospital number and ask for the Internal Medicine Resident On-Call. If you need medication refills, please notify your pharmacy one week in advance and they will send [place] a request.   Thank you for letting us take part in your care. Wishing you the best!  Romana Juniper, MD 12/24/2021, 9:33 AM [facility] Internal Medicine Resident, PGY-1
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
## 2021-12-24 NOTE — Addendum Note (Signed)
Addended by: Romana Juniper on: 12/24/2021 04:47 PM   Modules accepted: Orders

## 2021-12-24 NOTE — Progress Notes (Signed)
Spoke with patient. There is no bed available right now, but there may be one tonight. Hospital will call patient directly.  Patient continues to endorse muscle cramping and tingling, but no changes in mental status, palpitations, changes in vision, or SOB. Ordered potassium chloride; instructed patient to take 2x 69mq tablets and await for the hospital call. Will check on patient in the morning to follow up.  Discussed with Dr. MCain Sieveat 4:45pm

## 2021-12-24 NOTE — Assessment & Plan Note (Signed)
Patient continues to be insured.  Sent ambulatory referral for colonoscopy and mammogram as she has been due for these 2 screening tests.  We will also repeat an hemoglobin A1c and lipid panel.

## 2021-12-24 NOTE — Progress Notes (Signed)
Patient called.  Patient aware. Awaiting call for direct admission to hospital for IV potassium repletion.

## 2021-12-24 NOTE — Telephone Encounter (Signed)
Following message from patient's PCP:  Will call for direct admission to be treated. thank you!

## 2021-12-24 NOTE — Assessment & Plan Note (Addendum)
Hypokalemia Patient chronic hypokalemia, thought to be in the setting of GI losses.  Patient was recently hospitalized on 10/06/2021 for recurrent bowel obstruction 2/2 adhesions for multiple abdominal surgeries and found to have a K of 2.7, requiring inpatient supplementation.  Since discharge, patient mentioned that she has been doing okay.  Had not noticed cramping or tingling sensation until about 3 weeks ago, which is when she ran out of her daily po K 53mq pills.  Since then she has experienced some weakness, hand and feet cramping and numbness .  Patient denies confusion, palpitations, changes in vision, chest pain, diarrhea or constipation.   It seems this is a chronic issue for patient.  Her symptoms are consistent with her lack of supplementation for the past 3 weeks, especially with a low K baseline.  Reviewed medication list to search for other sources of potassium loss, found on .  Patient's BP today is 118/80 with a normal heart rate, ; do not suspect adrenergic activity .  Patient is not acidotic on latest BMP, suspicion for RTA at this time.  Will recheck TSH and vitamin B12 today as abnormalities in these 2 values could also explain some of the patient's clinical presentation.  The patient that if potassium is critically low, she will likely need inpatient IV K repletion; she understood and agrees with plan. -Patient will be repeating BMP stat to guide potassium replacement therapy -TSH level today, as Pediotic paralysis and cramping can be due to thyroid iron abnormalities -Vitamin B12 recheck

## 2021-12-25 ENCOUNTER — Other Ambulatory Visit (HOSPITAL_COMMUNITY): Payer: Self-pay

## 2021-12-25 DIAGNOSIS — J45909 Unspecified asthma, uncomplicated: Secondary | ICD-10-CM | POA: Diagnosis not present

## 2021-12-25 DIAGNOSIS — E876 Hypokalemia: Secondary | ICD-10-CM | POA: Diagnosis not present

## 2021-12-25 DIAGNOSIS — Z91040 Latex allergy status: Secondary | ICD-10-CM | POA: Diagnosis not present

## 2021-12-25 DIAGNOSIS — Z79899 Other long term (current) drug therapy: Secondary | ICD-10-CM | POA: Diagnosis not present

## 2021-12-25 LAB — LIPID PANEL
Chol/HDL Ratio: 4.6 ratio — ABNORMAL HIGH (ref 0.0–4.4)
Cholesterol, Total: 230 mg/dL — ABNORMAL HIGH (ref 100–199)
HDL: 50 mg/dL (ref >39)
LDL Chol Calc (NIH): 166 mg/dL — ABNORMAL HIGH (ref 0–99)
Triglycerides: 78 mg/dL (ref 0–149)
VLDL Cholesterol Cal: 14 mg/dL (ref 5–40)

## 2021-12-25 LAB — BASIC METABOLIC PANEL
Anion gap: 5 (ref 5–15)
Anion gap: 5 (ref 5–15)
Anion gap: 8 (ref 5–15)
BUN: 10 mg/dL (ref 6–20)
BUN: 10 mg/dL (ref 6–20)
BUN: 8 mg/dL (ref 6–20)
CO2: 25 mmol/L (ref 22–32)
CO2: 26 mmol/L (ref 22–32)
CO2: 26 mmol/L (ref 22–32)
Calcium: 8.6 mg/dL — ABNORMAL LOW (ref 8.9–10.3)
Calcium: 8.7 mg/dL — ABNORMAL LOW (ref 8.9–10.3)
Calcium: 8.9 mg/dL (ref 8.9–10.3)
Chloride: 106 mmol/L (ref 98–111)
Chloride: 108 mmol/L (ref 98–111)
Chloride: 110 mmol/L (ref 98–111)
Creatinine, Ser: 0.79 mg/dL (ref 0.44–1.00)
Creatinine, Ser: 0.85 mg/dL (ref 0.44–1.00)
Creatinine, Ser: 0.89 mg/dL (ref 0.44–1.00)
GFR, Estimated: >60 mL/min (ref >60)
GFR, Estimated: >60 mL/min (ref >60)
GFR, Estimated: >60 mL/min (ref >60)
Glucose, Bld: 89 mg/dL (ref 70–99)
Glucose, Bld: 89 mg/dL (ref 70–99)
Glucose, Bld: 93 mg/dL (ref 70–99)
Potassium: 2.4 mmol/L — CL (ref 3.5–5.1)
Potassium: 3 mmol/L — ABNORMAL LOW (ref 3.5–5.1)
Potassium: 3.3 mmol/L — ABNORMAL LOW (ref 3.5–5.1)
Sodium: 139 mmol/L (ref 135–145)
Sodium: 139 mmol/L (ref 135–145)
Sodium: 141 mmol/L (ref 135–145)

## 2021-12-25 LAB — CBC WITH DIFFERENTIAL/PLATELET
Abs Immature Granulocytes: 0 10*3/uL (ref 0.00–0.07)
Basophils Absolute: 0 10*3/uL (ref 0.0–0.1)
Basophils Relative: 0 %
Eosinophils Absolute: 0.1 10*3/uL (ref 0.0–0.5)
Eosinophils Relative: 1 %
HCT: 34.8 % — ABNORMAL LOW (ref 36.0–46.0)
Hemoglobin: 11.8 g/dL — ABNORMAL LOW (ref 12.0–15.0)
Immature Granulocytes: 0 %
Lymphocytes Relative: 40 %
Lymphs Abs: 2.7 10*3/uL (ref 0.7–4.0)
MCH: 28.4 pg (ref 26.0–34.0)
MCHC: 33.9 g/dL (ref 30.0–36.0)
MCV: 83.9 fL (ref 80.0–100.0)
Monocytes Absolute: 0.4 10*3/uL (ref 0.1–1.0)
Monocytes Relative: 5 %
Neutro Abs: 3.7 10*3/uL (ref 1.7–7.7)
Neutrophils Relative %: 54 %
Platelets: 381 10*3/uL (ref 150–400)
RBC: 4.15 MIL/uL (ref 3.87–5.11)
RDW: 14.2 % (ref 11.5–15.5)
WBC: 6.9 10*3/uL (ref 4.0–10.5)
nRBC: 0 % (ref 0.0–0.2)

## 2021-12-25 LAB — MAGNESIUM: Magnesium: 1.9 mg/dL (ref 1.7–2.4)

## 2021-12-25 LAB — IRON AND TIBC
Iron: 54 ug/dL (ref 28–170)
Saturation Ratios: 18 % (ref 10.4–31.8)
TIBC: 307 ug/dL (ref 250–450)
UIBC: 253 ug/dL

## 2021-12-25 LAB — CBC
HCT: 30.6 % — ABNORMAL LOW (ref 36.0–46.0)
Hemoglobin: 10.5 g/dL — ABNORMAL LOW (ref 12.0–15.0)
MCH: 28.8 pg (ref 26.0–34.0)
MCHC: 34.3 g/dL (ref 30.0–36.0)
MCV: 83.8 fL (ref 80.0–100.0)
Platelets: 322 10*3/uL (ref 150–400)
RBC: 3.65 MIL/uL — ABNORMAL LOW (ref 3.87–5.11)
RDW: 14.2 % (ref 11.5–15.5)
WBC: 6.1 10*3/uL (ref 4.0–10.5)
nRBC: 0 % (ref 0.0–0.2)

## 2021-12-25 LAB — TSH
TSH: 3.35 u[IU]/mL (ref 0.450–4.500)
TSH: 2.49 u[IU]/mL (ref 0.350–4.500)

## 2021-12-25 LAB — NA AND K (SODIUM & POTASSIUM), RAND UR
Potassium Urine: 37 mmol/L
Sodium, Ur: 15 mmol/L

## 2021-12-25 LAB — FERRITIN: Ferritin: 29 ng/mL (ref 11–307)

## 2021-12-25 LAB — CHLORIDE, URINE, RANDOM: Chloride Urine: 73 mmol/L

## 2021-12-25 LAB — VITAMIN B12
Vitamin B-12: 865 pg/mL (ref 232–1245)
Vitamin B-12: 621 pg/mL (ref 180–914)

## 2021-12-25 LAB — CREATININE, URINE, RANDOM: Creatinine, Urine: 317 mg/dL

## 2021-12-25 LAB — OSMOLALITY: Osmolality: 285 mOsm/kg (ref 275–295)

## 2021-12-25 LAB — OSMOLALITY, URINE: Osmolality, Ur: 631 mOsm/kg (ref 300–900)

## 2021-12-25 MED ORDER — POTASSIUM CHLORIDE 10 MEQ/100ML IV SOLN
10.0000 meq | INTRAVENOUS | Status: AC
  Administered 2021-12-25 (×3): 10 meq via INTRAVENOUS
  Filled 2021-12-25 (×3): qty 100

## 2021-12-25 MED ORDER — LACTATED RINGERS IV SOLN: INTRAVENOUS | Status: AC

## 2021-12-25 MED ORDER — POTASSIUM CHLORIDE CRYS ER 20 MEQ PO TBCR
20.0000 meq | EXTENDED_RELEASE_TABLET | Freq: Every day | ORAL | 0 refills | Status: DC
  Filled 2021-12-25: qty 30, 30d supply, fill #0

## 2021-12-25 MED ORDER — POTASSIUM CHLORIDE CRYS ER 20 MEQ PO TBCR
40.0000 meq | EXTENDED_RELEASE_TABLET | ORAL | Status: AC
  Administered 2021-12-25 (×2): 40 meq via ORAL
  Filled 2021-12-25 (×2): qty 2

## 2021-12-25 NOTE — Hospital Course (Signed)
Ms. Jullia Mulligan is a 51 yo F with a PMH of multiple abdominal surgeries 2/2 endometriosis, uterine fibroids, and SBO and recurrent hypokalemia of unclear etiology.   Patient presented to clinic earlier in the day for follow up regarding her hypokalemia. She states that she ran out of her potassium supplements about 2 weeks prior to the day of admission but event before this, she had some cramping and numbness in her hands and feet. She also felt has though the skin of her hands and feet were really taut. Patient states when these symptoms are present it is usually indicative of her potassium being low.  She notes she has also been constipated during this time but continues to pass flatus. She has had no N/V. She is able to urinate without any difficulties.  She denies any confusion, palpitations, changes in vision, or chest pain.   Of note, her recurrent hypokalemia has been worked up in the past. She has never had a metabolic acidosis or alkalosis, prior work up for hyperaldosteronism has been negative, and her laboratory work has not been concerning for RTA. She has had SBO in the past which were thought to be responsible for her acute on chronic hypokalemia but did not sufficiently explain her persistent hypokalemia outside of these episodes.
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
## 2021-12-25 NOTE — Discharge Summary (Signed)
Name: Meagan Dean MRN: [id number] DOB: [birth year] 51 y.o. PCP: Romana Juniper, MD  Date of Admission: 12/24/2021  9:59 PM Date of Discharge: 12/25/2021 Attending Physician: Lucious Groves, DO  Discharge Diagnosis: 1. Principal Problem:   Hypokalemia   Discharge Medications: Allergies as of 12/25/2021       Reactions   Latex Itching   Powder in latex gloves   Reglan [metoclopramide] Other (See Comments)   Muscle twitching/lip twitch   Vicodin [hydrocodone-acetaminophen] Other (See Comments)   Made her feel "high" Has had dilaudid before and tolerated it well        Medication List     STOP taking these medications    naproxen 500 MG tablet Commonly known as: NAPROSYN   POTASSIUM PO       TAKE these medications    docusate sodium 100 MG capsule Commonly known as: COLACE Take 100 mg by mouth daily.   loratadine 10 MG tablet Commonly known as: CLARITIN Take 10 mg by mouth daily.   naproxen sodium 220 MG tablet Commonly known as: ALEVE Take 440 mg by mouth every 6 (six) hours as needed (pain).   ondansetron 4 MG disintegrating tablet Commonly known as: ZOFRAN-ODT '4mg'$  ODT q4 hours prn nausea/vomit   potassium chloride SA 20 MEQ tablet Commonly known as: Klor-Con M20 Take 1 tablet (20 mEq total) by mouth daily.   PROBIOTIC PO Take 1 capsule by mouth daily.   sodium chloride 0.65 % Soln nasal spray Commonly known as: OCEAN Place 1 spray into both nostrils as needed for congestion.        Disposition and follow-up:   Ms.Myrle D Lillo was discharged from [facility] in Stable condition.  At the hospital follow up visit please address:  1.    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 TTKG of 6  -Resume KCl 20 meq daily -She will need a repeat BMP at follow-up -Consider adding Eplerenone at follow up if agreeable.  2.  Labs / imaging needed at time of follow-up: BMP  3.  Pending labs/ test needing  follow-up: NA  Follow-up Appointments:  [facility] follow up in 1 week  Hospital Course by problem list:  Chronic hypokalemia This is a direct admission from clinic for critically low potassium value of 2.4.  Patient ran out of her potassium supplement 2 weeks ago.  Patient reports cramping and numbness in the hands and feet which usually is indicative of her potassium being low.  Patient denies nausea, vomiting, confusion, palpitation, vision change, chest pain or urinary issue.  This is a chronic issue dating back in 2015.  Her Aldo/renin work-up in 2018 was normal.  She has a repeat Aldo/renin in 2023 showed elevated aldosterone and renin level, which is not consistent with primary hyperaldosteronism.  Low suspicion for RTA without metabolic acidosis.  Low suspicion for little/Bartter syndrome without metabolic alkalosis.  She has no obvious GI losses or on a diuretic either.  She was on spironolactone but was discontinued twice.  Calculated transtubular potassium gradient of 6, which suggests renal wasting of potassium.  Her potassium improved after 3 doses of IV 10 mEq and a total of 160 mEq of Klor.  Her daily KCl 20 mEq daily was resumed after discharge.  Subjective: Patient reports doing well this morning.  She has no abdominal pain or nausea/vomiting.  Patient ran out of her potassium supplement 2 weeks ago.  She typically gets her medication from mail order pharmacy.  Patient has tried spironolactone twice but discontinue because of her potassium was even lower after starting this medication.  Discharge Exam:   BP 98/75 (BP Location: Left Arm)   Pulse 78   Temp 97.7 F (36.5 C) (Oral)   Resp 16   LMP  (LMP Unknown) Comment: 07/2016  SpO2 96%  Discharge exam:  Physical Exam Constitutional:      General: She is not in acute distress.    Appearance: She is not ill-appearing.  HENT:     Head: Normocephalic.  Eyes:     General:        Right eye: No discharge.        Left eye: No  discharge.     Conjunctiva/sclera: Conjunctivae normal.  Cardiovascular:     Rate and Rhythm: Normal rate and regular rhythm.     Heart sounds: No murmur heard. Pulmonary:     Effort: Pulmonary effort is normal. No respiratory distress.  Abdominal:     General: Bowel sounds are normal. There is no distension.     Palpations: Abdomen is soft.     Tenderness: There is no abdominal tenderness.  Musculoskeletal:        General: Normal range of motion.     Cervical back: Normal range of motion.  Skin:    General: Skin is warm.  Neurological:     General: No focal deficit present.     Mental Status: She is alert.  Psychiatric:        Mood and Affect: Mood normal.      Pertinent Labs, Studies, and Procedures:     Latest Ref Rng & Units 12/25/2021    6:17 AM 12/24/2021   11:37 PM 10/06/2021    9:49 PM  CBC  WBC 4.0 - 10.5 K/uL 6.1  6.9  6.8   Hemoglobin 12.0 - 15.0 g/dL 10.5  11.8  13.0   Hematocrit 36.0 - 46.0 % 30.6  34.8  39.1   Platelets 150 - 400 K/uL 322  381  297       Latest Ref Rng & Units 12/25/2021    3:09 PM 12/25/2021    6:17 AM 12/24/2021   11:37 PM  CMP  Glucose 70 - 99 mg/dL 89  89  93   BUN 6 - 20 mg/dL '10  8  10   '$ Creatinine 0.44 - 1.00 mg/dL 0.85  0.79  0.89   Sodium 135 - 145 mmol/L 139  141  139   Potassium 3.5 - 5.1 mmol/L 3.3  3.0  2.4   Chloride 98 - 111 mmol/L 108  110  106   CO2 22 - 32 mmol/L '26  26  25   '$ Calcium 8.9 - 10.3 mg/dL 8.7  8.6  8.9     Discharge Instructions: Discharge Instructions     Call MD for:  persistant nausea and vomiting   Complete by: As directed    Call MD for:  redness, tenderness, or signs of infection (pain, swelling, redness, odor or green/yellow discharge around incision site)   Complete by: As directed    Call MD for:  severe uncontrolled pain   Complete by: As directed    Diet - low sodium heart healthy   Complete by: As directed    Discharge instructions   Complete by: As directed    Ms. Loeffler,  It was a  pleasure taking care of you during this admission.  You were hospitalized for low potassium value.  Our urine  test indicates that your kidneys are wasting the potassium.  We will continue potassium supplement after discharge, 1 tablet daily.  We will have you follow-up with the clinic next week for repeat blood work.  The clinic will call you for an appointment.  Take care,  Dr. Alfonse Spruce   Increase activity slowly   Complete by: As directed        Signed: Gaylan Gerold, DO 12/25/2021, 4:26 PM   Pager: [phone]
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
## 2021-12-25 NOTE — Progress Notes (Signed)
Internal Medicine Clinic Attending  I saw and evaluated the patient.  I personally confirmed the key portions of the history and exam documented by Dr. Simeon Craft and I reviewed pertinent patient test results.  The assessment, diagnosis, and plan were formulated together and I agree with the documentation in the resident's note.

## 2021-12-25 NOTE — Progress Notes (Signed)
Discharge instructions provided to patient. All medications, follow up appointments, and discharge instructions discussed. IV out. Monitor off. CCMD notified. Discharging to home.  Era Bumpers, RN

## 2021-12-31 NOTE — Progress Notes (Signed)
Patient called.  Left message for patient to call back. Patient received inpatient treatment for hypokalemia. Patient is scheduled to be seen on 8/23 for repeat cbc and follow up inpatient hypokalemia work up.  Regarding lipid panel, patient's ASCVD risk is 2.6%, not requiring the initiation of statin therapy at this time.

## 2022-01-02 ENCOUNTER — Encounter: Payer: Self-pay | Admitting: Student

## 2022-01-02 ENCOUNTER — Ambulatory Visit (INDEPENDENT_AMBULATORY_CARE_PROVIDER_SITE_OTHER): Payer: Commercial Managed Care - PPO | Admitting: Student

## 2022-01-02 VITALS — BP 125/96 | HR 71 | Temp 98.4°F | Ht 63.0 in | Wt 166.2 lb

## 2022-01-02 DIAGNOSIS — Z1211 Encounter for screening for malignant neoplasm of colon: Secondary | ICD-10-CM

## 2022-01-02 DIAGNOSIS — E876 Hypokalemia: Secondary | ICD-10-CM | POA: Diagnosis not present

## 2022-01-02 DIAGNOSIS — R252 Cramp and spasm: Secondary | ICD-10-CM

## 2022-01-02 DIAGNOSIS — Z0000 Encounter for general adult medical examination without abnormal findings: Secondary | ICD-10-CM

## 2022-01-02 NOTE — Patient Instructions (Addendum)
Thank you, [name] for allowing [place] to provide your care today. Today we discussed your recent hospitalization. We are repeating your labs and will let you know if we need to increase your potassium.     I have ordered the following labs for you:   Lab Orders         CMP14 + Anion Gap         Magnesium      I will call if any are abnormal. All of your labs can be accessed through "My Chart".  I have placed a referral to GI for colonoscopy  My Chart Access: [email or web address]  Please follow-up in 3 months or as needed  Please make sure to arrive 15 minutes prior to your next appointment. If you arrive late, you may be asked to reschedule.    We look forward to seeing you next time. Please call our clinic at [phone] if you have any questions or concerns. The best time to call is Monday-Friday from 9am-4pm, but there is someone available 24/7. If after hours or the weekend, call the main hospital number and ask for the Internal Medicine Resident On-Call. If you need medication refills, please notify your pharmacy one week in advance and they will send [place] a request.   Thank you for letting us take part in your care. Wishing you the best!  Lacinda Axon, MD 01/02/2022, 10:28 AM IM Resident, PGY-3 [facility]ian 41:10

## 2022-01-02 NOTE — Progress Notes (Signed)
CC: Hospital follow-up  HPI:  [name] is a 51 y.o. female with PMH as below who presents to clinic for hospital follow-up after recent hospitalization for hypokalemia. Please see problem based charting for evaluation, assessment and plan.  Past Medical History:  Diagnosis Date   Anemia    ASCUS with positive high risk HPV cervical 03/15/2017   Asthma due to seasonal allergies    MILD-- JUST GOT INHALER-not used yet -allergy induced   Calculus of ureter 06/16/2013   Endometriosis of pelvis    Fibroids 09/02/2017   GERD (gastroesophageal reflux disease)    diet controlled   H/O hiatal hernia    Headache    MIGRAINES - none since weight loss surgery   History of endometriosis 03/28/2017   History of hypertension    NO ISSUES SINCE WT LOSS AFTER GASTRIC BANDING   History of kidney stones    surgery to remove   History of obstructive sleep apnea    DX'D PRIOR TO GASTRIC BANDING  2010 --  NO ISSUES SINCE WT LOSS   Hypertension 03/28/2017   Low potassium syndrome    hopsitalized for 3 days   Right ureteral stone    Seasonal allergies    Sinus headache    Sleep apnea    prior to weight loss surgery, does not have cpap   Urgency of urination     Review of Systems:  Constitutional: Positive for occasional fatigue. Eyes: Negative for visual changes Cardiac: Positive for occasional chest discomfort. Negative for palpitations. MSK: Positive for hand and leg cramping Abdomen: Negative for abdominal pain, nausea or vomiting Neuro: Positive for occasional numbness. Negative for headache, dizziness or weakness  Physical Exam: General: Pleasant, appearing middle-aged woman.  No acute distress. Cardiac: RRR. No murmurs, rubs or gallops. No LE edema Respiratory: Lungs CTAB. No wheezing or crackles. Abdominal: Soft, symmetric and non tender. Normal BS. Skin: Warm, dry and intact without rashes or lesions Extremities: Atraumatic. Full ROM. Radial and DP pulses 2+ and  symmetric. Neuro: A&O x 3. Moves all extremities. Normal sensation to gross touch. Psych: Appropriate mood and affect.  Vitals:   01/02/22 0940  BP: (!) 125/96  Pulse: 71  Temp: 98.4 F (36.9 C)  TempSrc: Oral  SpO2: 100%  Weight: 166 lb 3.2 oz (75.4 kg)  Height: '5\' 3"'$  (1.6 m)    Assessment & Plan:   Hypokalemia Patient here for hospital follow-up after recent short hospitalization from 8/14 to 8/15 for severe symptomatic hypokalemia with a K+ of 2.4. She presented with numbness and cramping of her extremities after running out of her potassium supplementation for about 2 weeks. Patient has had history of hypokalemia dating back to 2015. Work-up so far has been negative for primary hyperaldosteronism, renal tubular acidosis, diuretic use, GI losses or genetic causes such as Bartter, Liddle or Gitelman syndromes. Her hypokalemia was thought to be secondary to renal losses. She was managed with IV and oral potassium with improvement in her potassium to 3.3 on day of discharge. She was discharged home on 20 mEq KCl daily. Since discharge, patient states she has been feeling much better with occasional cramping in her hands but no numbness and no nausea or vomiting. Repeat CMP today shows improvement in her potassium to 3.9 with normal calcium and sodium. Magnesium is normal at 1.8. We will continue patient on current potassium supplementation with multiple refills to keep her from running out.  Plan: -Continue KCl 20 mEq daily -Repeat BMP at  next office visit -Follow-up in 3 months or as needed  Routine health maintenance New referral to GI for colonoscopy placed.  Patient thinks someone called her while she was hospitalized but unsure if this was to schedule her colonoscopy or her mammogram.   See Encounters Tab for problem based charting.  Patient discussed with Dr. Lockie Pares, MD, MPH

## 2022-01-03 ENCOUNTER — Encounter: Payer: Self-pay | Admitting: Student

## 2022-01-03 LAB — MAGNESIUM: Magnesium: 1.8 mg/dL (ref 1.6–2.3)

## 2022-01-03 LAB — CMP14 + ANION GAP
ALT: 6 IU/L (ref 0–32)
AST: 15 IU/L (ref 0–40)
Albumin/Globulin Ratio: 1.6 (ref 1.2–2.2)
Albumin: 4.2 g/dL (ref 3.9–4.9)
Alkaline Phosphatase: 85 IU/L (ref 44–121)
Anion Gap: 14 mmol/L (ref 10.0–18.0)
BUN/Creatinine Ratio: 8 — ABNORMAL LOW (ref 9–23)
BUN: 6 mg/dL (ref 6–24)
Bilirubin Total: 0.3 mg/dL (ref 0.0–1.2)
CO2: 25 mmol/L (ref 20–29)
Calcium: 9.2 mg/dL (ref 8.7–10.2)
Chloride: 102 mmol/L (ref 96–106)
Creatinine, Ser: 0.75 mg/dL (ref 0.57–1.00)
Globulin, Total: 2.6 g/dL (ref 1.5–4.5)
Glucose: 80 mg/dL (ref 70–99)
Potassium: 3.9 mmol/L (ref 3.5–5.2)
Sodium: 141 mmol/L (ref 134–144)
Total Protein: 6.8 g/dL (ref 6.0–8.5)
eGFR: 97 mL/min/{1.73_m2} (ref >59)

## 2022-01-03 NOTE — Assessment & Plan Note (Signed)
New referral to GI for colonoscopy placed.  Patient thinks someone called her while she was hospitalized but unsure if this was to schedule her colonoscopy or her mammogram.

## 2022-01-03 NOTE — Assessment & Plan Note (Signed)
Patient here for hospital follow-up after recent short hospitalization from 8/14 to 8/15 for severe symptomatic hypokalemia with a K+ of 2.4. She presented with numbness and cramping of her extremities after running out of her potassium supplementation for about 2 weeks. Patient has had history of hypokalemia dating back to 2015. Work-up so far has been negative for primary hyperaldosteronism, renal tubular acidosis, diuretic use, GI losses or genetic causes such as Bartter, Liddle or Gitelman syndromes. Her hypokalemia was thought to be secondary to renal losses. She was managed with IV and oral potassium with improvement in her potassium to 3.3 on day of discharge. She was discharged home on 20 mEq KCl daily. Since discharge, patient states she has been feeling much better with occasional cramping in her hands but no numbness and no nausea or vomiting. Repeat CMP today shows improvement in her potassium to 3.9 with normal calcium and sodium. Magnesium is normal at 1.8. We will continue patient on current potassium supplementation with multiple refills to keep her from running out.  Plan: -Continue KCl 20 mEq daily -Repeat BMP at next office visit -Follow-up in 3 months or as needed

## 2022-01-04 ENCOUNTER — Ambulatory Visit (HOSPITAL_BASED_OUTPATIENT_CLINIC_OR_DEPARTMENT_OTHER)
Admission: RE | Admit: 2022-01-04 | Discharge: 2022-01-04 | Disposition: A | Discharge status: Home / Self Care | Payer: Commercial Managed Care - PPO | Source: Ambulatory Visit | Attending: Internal Medicine | Admitting: Internal Medicine

## 2022-01-04 DIAGNOSIS — Z1231 Encounter for screening mammogram for malignant neoplasm of breast: Secondary | ICD-10-CM | POA: Insufficient documentation

## 2022-01-04 DIAGNOSIS — Z0000 Encounter for general adult medical examination without abnormal findings: Secondary | ICD-10-CM | POA: Diagnosis not present

## 2022-01-04 NOTE — Progress Notes (Signed)
Internal Medicine Clinic Attending  Case discussed with Dr. Amponsah  At the time of the visit.  We reviewed the resident's history and exam and pertinent patient test results.  I agree with the assessment, diagnosis, and plan of care documented in the resident's note.  

## 2022-01-09 ENCOUNTER — Other Ambulatory Visit: Payer: Self-pay | Admitting: Internal Medicine

## 2022-01-09 DIAGNOSIS — R928 Other abnormal and inconclusive findings on diagnostic imaging of breast: Secondary | ICD-10-CM

## 2022-01-21 ENCOUNTER — Ambulatory Visit
Admission: RE | Admit: 2022-01-21 | Discharge: 2022-01-21 | Disposition: A | Discharge status: Home / Self Care | Payer: Commercial Managed Care - PPO | Source: Ambulatory Visit | Attending: Internal Medicine | Admitting: Internal Medicine

## 2022-01-21 ENCOUNTER — Other Ambulatory Visit: Payer: Self-pay | Admitting: Internal Medicine

## 2022-01-21 DIAGNOSIS — N6310 Unspecified lump in the right breast, unspecified quadrant: Secondary | ICD-10-CM

## 2022-01-21 DIAGNOSIS — R928 Other abnormal and inconclusive findings on diagnostic imaging of breast: Secondary | ICD-10-CM

## 2022-01-22 ENCOUNTER — Other Ambulatory Visit: Payer: Self-pay | Admitting: Internal Medicine

## 2022-01-22 DIAGNOSIS — N6310 Unspecified lump in the right breast, unspecified quadrant: Secondary | ICD-10-CM

## 2022-02-08 ENCOUNTER — Other Ambulatory Visit: Payer: Self-pay

## 2022-02-08 ENCOUNTER — Encounter (HOSPITAL_BASED_OUTPATIENT_CLINIC_OR_DEPARTMENT_OTHER): Payer: Self-pay | Admitting: *Deleted

## 2022-02-08 ENCOUNTER — Emergency Department (HOSPITAL_BASED_OUTPATIENT_CLINIC_OR_DEPARTMENT_OTHER)
Admission: EM | Admit: 2022-02-08 | Discharge: 2022-02-09 | Disposition: A | Discharge status: Home / Self Care | Payer: Commercial Managed Care - PPO | Attending: Emergency Medicine | Admitting: Emergency Medicine

## 2022-02-08 DIAGNOSIS — Z23 Encounter for immunization: Secondary | ICD-10-CM | POA: Insufficient documentation

## 2022-02-08 DIAGNOSIS — T2122XA Burn of second degree of abdominal wall, initial encounter: Secondary | ICD-10-CM | POA: Insufficient documentation

## 2022-02-08 DIAGNOSIS — T310 Burns involving less than 10% of body surface: Secondary | ICD-10-CM | POA: Diagnosis not present

## 2022-02-08 DIAGNOSIS — X19XXXA Contact with other heat and hot substances, initial encounter: Secondary | ICD-10-CM | POA: Diagnosis not present

## 2022-02-08 DIAGNOSIS — J45909 Unspecified asthma, uncomplicated: Secondary | ICD-10-CM | POA: Insufficient documentation

## 2022-02-08 DIAGNOSIS — Z79899 Other long term (current) drug therapy: Secondary | ICD-10-CM | POA: Diagnosis not present

## 2022-02-08 DIAGNOSIS — Z91040 Latex allergy status: Secondary | ICD-10-CM | POA: Insufficient documentation

## 2022-02-08 DIAGNOSIS — I10 Essential (primary) hypertension: Secondary | ICD-10-CM | POA: Insufficient documentation

## 2022-02-08 DIAGNOSIS — T304 Corrosion of unspecified body region, unspecified degree: Secondary | ICD-10-CM
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
## 2022-02-09 MED ORDER — BACITRACIN ZINC 500 UNIT/GM EX OINT: 1.0000 | TOPICAL_OINTMENT | Freq: Two times a day (BID) | CUTANEOUS | 0 refills | Status: DC

## 2022-02-09 MED ORDER — TETANUS-DIPHTH-ACELL PERTUSSIS 5-2.5-18.5 LF-MCG/0.5 IM SUSY
0.5000 mL | PREFILLED_SYRINGE | Freq: Once | INTRAMUSCULAR | Status: AC
  Administered 2022-02-09: 0.5 mL via INTRAMUSCULAR
  Filled 2022-02-09: qty 0.5
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
## 2022-03-25 ENCOUNTER — Other Ambulatory Visit: Payer: Self-pay | Admitting: Student

## 2022-03-25 NOTE — Telephone Encounter (Signed)
Refill Request   potassium chloride SA (KLOR-CON M20) 20 MEQ tablet    [facility] Address: [address] [place], [place] [address] [place], [place] [address]   Phone: [phone]

## 2022-03-26 MED ORDER — POTASSIUM CHLORIDE CRYS ER 20 MEQ PO TBCR: 20.0000 meq | EXTENDED_RELEASE_TABLET | Freq: Every day | ORAL | 0 refills | Status: DC

## 2022-04-24 ENCOUNTER — Other Ambulatory Visit (HOSPITAL_COMMUNITY)
Admission: RE | Admit: 2022-04-24 | Discharge: 2022-04-24 | Disposition: A | Discharge status: Home / Self Care | Payer: Commercial Managed Care - PPO | Source: Ambulatory Visit | Attending: Internal Medicine | Admitting: Internal Medicine

## 2022-04-24 ENCOUNTER — Ambulatory Visit (INDEPENDENT_AMBULATORY_CARE_PROVIDER_SITE_OTHER): Payer: Commercial Managed Care - PPO

## 2022-04-24 VITALS — BP 133/98 | HR 82 | Temp 98.3°F | Ht 63.0 in | Wt 161.9 lb

## 2022-04-24 DIAGNOSIS — A499 Bacterial infection, unspecified: Secondary | ICD-10-CM

## 2022-04-24 DIAGNOSIS — E876 Hypokalemia: Secondary | ICD-10-CM | POA: Diagnosis not present

## 2022-04-24 DIAGNOSIS — Z0000 Encounter for general adult medical examination without abnormal findings: Secondary | ICD-10-CM

## 2022-04-24 DIAGNOSIS — G4700 Insomnia, unspecified: Secondary | ICD-10-CM

## 2022-04-24 DIAGNOSIS — N390 Urinary tract infection, site not specified: Secondary | ICD-10-CM

## 2022-04-24 DIAGNOSIS — I10 Essential (primary) hypertension: Secondary | ICD-10-CM | POA: Diagnosis not present

## 2022-04-24 DIAGNOSIS — R3589 Other polyuria: Secondary | ICD-10-CM | POA: Insufficient documentation

## 2022-04-24 DIAGNOSIS — R399 Unspecified symptoms and signs involving the genitourinary system: Secondary | ICD-10-CM

## 2022-04-24 DIAGNOSIS — Z1211 Encounter for screening for malignant neoplasm of colon: Secondary | ICD-10-CM

## 2022-04-24 HISTORY — DX: Bacterial infection, unspecified: N39.0

## 2022-04-24 HISTORY — DX: Bacterial infection, unspecified: A49.9

## 2022-04-24 LAB — URINALYSIS, ROUTINE W REFLEX MICROSCOPIC
Bilirubin Urine: NEGATIVE
Glucose, UA: NEGATIVE mg/dL
Hgb urine dipstick: NEGATIVE
Ketones, ur: NEGATIVE mg/dL
Nitrite: NEGATIVE
Protein, ur: 100 mg/dL — AB
Specific Gravity, Urine: 1.031 — ABNORMAL HIGH (ref 1.005–1.030)
WBC, UA: >50 WBC/hpf — ABNORMAL HIGH (ref 0–5)
pH: 5 (ref 5.0–8.0)

## 2022-04-24 NOTE — Assessment & Plan Note (Signed)
BP Readings from Last 3 Encounters:  04/24/22 (!) 133/98  02/09/22 (!) 136/98  01/02/22 (!) 125/96   Pressure elevated today. Not on medication (did not tolerate spironolactone previously). No headaches, vision changes, chest pain.  Plan: If potassium wnl, will trial lisinopril or losartan. Patient amenable to plan.

## 2022-04-24 NOTE — Progress Notes (Signed)
CC: f/u hypokalemia  HPI:  [name] is a 51 y.o. with past medical history seen below who presents for med refill and 3 mo follow up of hypokalemia.  Past Medical History:  Diagnosis Date   Anemia    ASCUS with positive high risk HPV cervical 03/15/2017   Asthma due to seasonal allergies    MILD-- JUST GOT INHALER-not used yet -allergy induced   Calculus of ureter 06/16/2013   Endometriosis of pelvis    Fibroids 09/02/2017   GERD (gastroesophageal reflux disease)    diet controlled   H/O hiatal hernia    Headache    MIGRAINES - none since weight loss surgery   History of endometriosis 03/28/2017   History of hypertension    NO ISSUES SINCE WT LOSS AFTER GASTRIC BANDING   History of kidney stones    surgery to remove   History of obstructive sleep apnea    DX'D PRIOR TO GASTRIC BANDING  2010 --  NO ISSUES SINCE WT LOSS   Hypertension 03/28/2017   Low potassium syndrome    hopsitalized for 3 days   Right ureteral stone    Seasonal allergies    Sinus headache    Sleep apnea    prior to weight loss surgery, does not have cpap   Urgency of urination    Review of Systems:  See detailed assessment and plan for pertinent ROS.  Physical Exam:  Vitals:   04/24/22 0908  BP: (!) 133/98  Pulse: 82  Temp: 98.3 F (36.8 C)  TempSrc: Oral  SpO2: 100%  Weight: 161 lb 14.4 oz (73.4 kg)  Height: '5\' 3"'$  (1.6 m)   Physical Exam Constitutional:      General: She is not in acute distress. HENT:     Head: Normocephalic and atraumatic.  Eyes:     Extraocular Movements: Extraocular movements intact.  Cardiovascular:     Rate and Rhythm: Normal rate and regular rhythm.  Pulmonary:     Effort: Pulmonary effort is normal.     Breath sounds: Normal breath sounds.  Abdominal:     Tenderness: There is no right CVA tenderness or left CVA tenderness.  Skin:    General: Skin is warm and dry.  Neurological:     General: No focal deficit present.     Mental Status: She is  alert and oriented to person, place, and time.  Psychiatric:        Mood and Affect: Mood normal.        Behavior: Behavior normal.      Assessment & Plan:   See Encounters Tab for problem based charting.  Hypokalemia Assessment & Plan: Had been taking potassium 20 meq daily with resolution of symptoms but ran out of prescription and symptoms returned (numbness, tingling, cramping). Able to restart earlier this week, though still having symptoms today.  Plan: BMP ordered. Will supplement K if necessary.  Orders: -     BMP8+Anion Gap  Colon cancer screening -     Ambulatory referral to Gastroenterology  Polyuria -     Urine cytology ancillary only -     Urinalysis, Routine w reflex microscopic  Hypertension, unspecified type Assessment & Plan: BP Readings from Last 3 Encounters:  04/24/22 (!) 133/98  02/09/22 (!) 136/98  01/02/22 (!) 125/96   Pressure elevated today. Not on medication (did not tolerate spironolactone previously). No headaches, vision changes, chest pain.  Plan: If potassium wnl, will trial lisinopril or losartan. Patient amenable to  plan.   Insomnia, unspecified type Assessment & Plan: Gets a couple hours of sleep a night. Endorses difficulty with onset and staying asleep. Patient denies any symptoms of depression or anxiety. Endorses good sleep hygiene (no caffeine late in day, no screens at night). Does endorse urinary frequency which she feels may contribute to symptoms. Endorses recent breakup which she feels may contribute as well. PHQ9 of 5 (mild depression).  Plan: -discuss mild depression at next f/u and whether her symptoms warrant SSRI/counseling -recommended melatonin for now   Routine health maintenance Assessment & Plan: New referral for colonoscopy and provided number for patient as well.   UTI symptoms Assessment & Plan: Patient endorses urinary frequency and burning with urination. Endorses suprapubic pressure as well. Reports her  urine smells fishy which she says is consistent with prior UTIs. Reports not new sexual partners. Denies discharge. No CVA tenderness on exam. Has chronic nausea which she connects to prior SBOs but no new changes in this.  Plan: UA with GC/Chlamydia add on. Will treat with abx if infectious.  Addendum: Will treat UTI with Macrobid x 7 days. Will add on culture. GC/chlamydia pending.       Patient seen with Dr.  Cain Sieve

## 2022-04-24 NOTE — Assessment & Plan Note (Signed)
Patient endorses urinary frequency and burning with urination. Endorses suprapubic pressure as well. Reports her urine smells fishy which she says is consistent with prior UTIs. Reports not new sexual partners. Denies discharge. No CVA tenderness on exam. Has chronic nausea which she connects to prior SBOs but no new changes in this.  Plan: UA with GC/Chlamydia add on. Will treat with abx if infectious.  Addendum: Will treat UTI with Macrobid x 7 days. Will add on culture. GC/chlamydia pending.

## 2022-04-24 NOTE — Assessment & Plan Note (Signed)
Gets a couple hours of sleep a night. Endorses difficulty with onset and staying asleep. Patient denies any symptoms of depression or anxiety. Endorses good sleep hygiene (no caffeine late in day, no screens at night). Does endorse urinary frequency which she feels may contribute to symptoms. Endorses recent breakup which she feels may contribute as well. PHQ9 of 5 (mild depression).  Plan: -discuss mild depression at next f/u and whether her symptoms warrant SSRI/counseling -recommended melatonin for now

## 2022-04-24 NOTE — Assessment & Plan Note (Signed)
New referral for colonoscopy and provided number for patient as well.

## 2022-04-24 NOTE — Assessment & Plan Note (Signed)
Had been taking potassium 20 meq daily with resolution of symptoms but ran out of prescription and symptoms returned (numbness, tingling, cramping). Able to restart earlier this week, though still having symptoms today.  Plan: BMP ordered. Will supplement K if necessary.  Addendum: K 3.2. Will increase KCL to 31mq daily and have her come back in ~4 weeks to discuss BP management possibly with eplerenone.

## 2022-04-24 NOTE — Patient Instructions (Addendum)
Ms.Laverle D Nightengale, it was a pleasure seeing you today!  Today we discussed: UTI - will call with urinalysis results and treat if indicated Low potassium - will call with results and likely continue potassium Insomnia - try melatonin. We can discuss at next visit if necessary Colonoscopy- call number below  [facility] Gastroenterologist in [place], [place] [facility] Address: [address], [place], [place] [address] Phone: [phone] I have ordered the following labs today:  Lab Orders  No laboratory test(s) ordered today     Tests ordered today:  UA, BMP  Referrals ordered today:   Referral Orders  No referral(s) requested today     I have ordered the following medication/changed the following medications:   Stop the following medications: There are no discontinued medications.   Start the following medications: No orders of the defined types were placed in this encounter.    Follow-up:  1 month    Please make sure to arrive 15 minutes prior to your next appointment. If you arrive late, you may be asked to reschedule.   We look forward to seeing you next time. Please call our clinic at [phone] if you have any questions or concerns. The best time to call is Monday-Friday from 9am-4pm, but there is someone available 24/7. If after hours or the weekend, call the main hospital number and ask for the Internal Medicine Resident On-Call. If you need medication refills, please notify your pharmacy one week in advance and they will send [place] a request.  Thank you for letting us take part in your care. Wishing you the best!  Thank you, Linward Natal, MD

## 2022-04-25 ENCOUNTER — Telehealth: Payer: Self-pay

## 2022-04-25 LAB — BMP8+ANION GAP
Anion Gap: 14 mmol/L (ref 10.0–18.0)
BUN/Creatinine Ratio: 11 (ref 9–23)
BUN: 9 mg/dL (ref 6–24)
CO2: 25 mmol/L (ref 20–29)
Calcium: 9.4 mg/dL (ref 8.7–10.2)
Chloride: 103 mmol/L (ref 96–106)
Creatinine, Ser: 0.83 mg/dL (ref 0.57–1.00)
Glucose: 77 mg/dL (ref 70–99)
Potassium: 3.2 mmol/L — ABNORMAL LOW (ref 3.5–5.2)
Sodium: 142 mmol/L (ref 134–144)
eGFR: 85 mL/min/{1.73_m2} (ref >59)

## 2022-04-25 LAB — URINE CYTOLOGY ANCILLARY ONLY
Chlamydia: NEGATIVE
Comment: NEGATIVE
Comment: NORMAL
Neisseria Gonorrhea: NEGATIVE

## 2022-04-25 MED ORDER — NITROFURANTOIN MONOHYD MACRO 100 MG PO CAPS: 100.0000 mg | ORAL_CAPSULE | Freq: Two times a day (BID) | ORAL | 0 refills | Status: AC

## 2022-04-25 MED ORDER — POTASSIUM CHLORIDE CRYS ER 20 MEQ PO TBCR: 40.0000 meq | EXTENDED_RELEASE_TABLET | Freq: Every day | ORAL | 0 refills | Status: DC

## 2022-04-25 NOTE — Progress Notes (Signed)
Internal Medicine Clinic Attending  I saw and evaluated the patient.  I personally confirmed the key portions of the history and exam documented by Dr. Dema Severin and I reviewed pertinent patient test results.  The assessment, diagnosis, and plan were formulated together and I agree with the documentation in the resident's note.   Several issues discussed today: UTI - patient has dysuria and increased frequency, feels similar to prior UTI's, with +leuk esterase on U/A. Will treat with macrobid. No concern for pyelo Hypokalemia - agree with increasing potassium from 20 to 40 meq daily, and repeat BMP at January appointment Blood pressure elevation - if BP still elevated at January visit, agree with Dr. Orest Dikes plan to start ACE/ARB then

## 2022-04-25 NOTE — Addendum Note (Signed)
Addended by: Linward Natal on: 04/25/2022 09:46 AM   Modules accepted: Orders

## 2022-04-25 NOTE — Addendum Note (Signed)
Addended by: Linward Natal on: 04/25/2022 09:07 AM   Modules accepted: Orders

## 2022-04-25 NOTE — Telephone Encounter (Signed)
Patient called regarding her rx for potassium per patient the pharmacy will not refill her rx until January 1st. Pharmacy is requesting a call to verify rx and to give consent to refill the rx

## 2022-04-27 LAB — URINE CULTURE: Culture: >=100000 — AB

## 2022-04-29 ENCOUNTER — Ambulatory Visit
Admission: RE | Admit: 2022-04-29 | Discharge: 2022-04-29 | Disposition: A | Discharge status: Home / Self Care | Payer: Commercial Managed Care - PPO | Source: Ambulatory Visit | Attending: Internal Medicine | Admitting: Internal Medicine

## 2022-04-29 DIAGNOSIS — N6310 Unspecified lump in the right breast, unspecified quadrant: Secondary | ICD-10-CM

## 2022-05-13 HISTORY — PX: CERVICAL SPINE SURGERY: SHX589

## 2022-05-15 ENCOUNTER — Emergency Department (HOSPITAL_BASED_OUTPATIENT_CLINIC_OR_DEPARTMENT_OTHER)
Admission: EM | Admit: 2022-05-15 | Discharge: 2022-05-15 | Disposition: A | Discharge status: Home / Self Care | Payer: Commercial Managed Care - PPO | Attending: Emergency Medicine | Admitting: Emergency Medicine

## 2022-05-15 ENCOUNTER — Other Ambulatory Visit: Payer: Self-pay

## 2022-05-15 ENCOUNTER — Encounter (HOSPITAL_BASED_OUTPATIENT_CLINIC_OR_DEPARTMENT_OTHER): Payer: Self-pay

## 2022-05-15 ENCOUNTER — Emergency Department (HOSPITAL_BASED_OUTPATIENT_CLINIC_OR_DEPARTMENT_OTHER): Payer: Commercial Managed Care - PPO

## 2022-05-15 DIAGNOSIS — Z91040 Latex allergy status: Secondary | ICD-10-CM | POA: Diagnosis not present

## 2022-05-15 DIAGNOSIS — E876 Hypokalemia: Secondary | ICD-10-CM | POA: Diagnosis not present

## 2022-05-15 DIAGNOSIS — R300 Dysuria: Secondary | ICD-10-CM | POA: Diagnosis not present

## 2022-05-15 DIAGNOSIS — R1030 Lower abdominal pain, unspecified: Secondary | ICD-10-CM | POA: Insufficient documentation

## 2022-05-15 DIAGNOSIS — R109 Unspecified abdominal pain: Secondary | ICD-10-CM

## 2022-05-15 LAB — CBC WITH DIFFERENTIAL/PLATELET
Abs Immature Granulocytes: 0.01 10*3/uL (ref 0.00–0.07)
Basophils Absolute: 0 10*3/uL (ref 0.0–0.1)
Basophils Relative: 0 %
Eosinophils Absolute: 0.1 10*3/uL (ref 0.0–0.5)
Eosinophils Relative: 2 %
HCT: 35.5 % — ABNORMAL LOW (ref 36.0–46.0)
Hemoglobin: 11.8 g/dL — ABNORMAL LOW (ref 12.0–15.0)
Immature Granulocytes: 0 %
Lymphocytes Relative: 38 %
Lymphs Abs: 2.5 10*3/uL (ref 0.7–4.0)
MCH: 27.7 pg (ref 26.0–34.0)
MCHC: 33.2 g/dL (ref 30.0–36.0)
MCV: 83.3 fL (ref 80.0–100.0)
Monocytes Absolute: 0.4 10*3/uL (ref 0.1–1.0)
Monocytes Relative: 6 %
Neutro Abs: 3.4 10*3/uL (ref 1.7–7.7)
Neutrophils Relative %: 54 %
Platelets: 344 10*3/uL (ref 150–400)
RBC: 4.26 MIL/uL (ref 3.87–5.11)
RDW: 14.1 % (ref 11.5–15.5)
WBC: 6.4 10*3/uL (ref 4.0–10.5)
nRBC: 0 % (ref 0.0–0.2)

## 2022-05-15 LAB — URINALYSIS, ROUTINE W REFLEX MICROSCOPIC
Bilirubin Urine: NEGATIVE
Glucose, UA: NEGATIVE mg/dL
Hgb urine dipstick: NEGATIVE
Ketones, ur: NEGATIVE mg/dL
Nitrite: NEGATIVE
Protein, ur: NEGATIVE mg/dL
Specific Gravity, Urine: 1.011 (ref 1.005–1.030)
pH: 6 (ref 5.0–8.0)

## 2022-05-15 LAB — MAGNESIUM: Magnesium: 2 mg/dL (ref 1.7–2.4)

## 2022-05-15 LAB — BASIC METABOLIC PANEL
Anion gap: 11 (ref 5–15)
BUN: 10 mg/dL (ref 6–20)
CO2: 29 mmol/L (ref 22–32)
Calcium: 9 mg/dL (ref 8.9–10.3)
Chloride: 101 mmol/L (ref 98–111)
Creatinine, Ser: 0.74 mg/dL (ref 0.44–1.00)
GFR, Estimated: >60 mL/min (ref >60)
Glucose, Bld: 88 mg/dL (ref 70–99)
Potassium: 2.8 mmol/L — ABNORMAL LOW (ref 3.5–5.1)
Sodium: 141 mmol/L (ref 135–145)

## 2022-05-15 LAB — WET PREP, GENITAL
Clue Cells Wet Prep HPF POC: NONE SEEN
Sperm: NONE SEEN
Trich, Wet Prep: NONE SEEN
WBC, Wet Prep HPF POC: <10 (ref <10)
Yeast Wet Prep HPF POC: NONE SEEN

## 2022-05-15 MED ORDER — KETOROLAC TROMETHAMINE 30 MG/ML IJ SOLN
30.0000 mg | Freq: Once | INTRAMUSCULAR | Status: AC
  Administered 2022-05-15: 30 mg via INTRAVENOUS
  Filled 2022-05-15: qty 1

## 2022-05-15 MED ORDER — SODIUM CHLORIDE 0.9 % IV SOLN: INTRAVENOUS | Status: DC | PRN

## 2022-05-15 MED ORDER — FOSFOMYCIN TROMETHAMINE 3 G PO PACK
3.0000 g | PACK | Freq: Once | ORAL | Status: AC
  Administered 2022-05-15: 3 g via ORAL
  Filled 2022-05-15: qty 3

## 2022-05-15 MED ORDER — POTASSIUM CHLORIDE 10 MEQ/100ML IV SOLN
10.0000 meq | Freq: Once | INTRAVENOUS | Status: AC
  Administered 2022-05-15: 10 meq via INTRAVENOUS
  Filled 2022-05-15: qty 100

## 2022-05-15 NOTE — ED Triage Notes (Addendum)
Pt traveling from [place]; c/o back/ kidney pain radiating to abd onset 3 days ago, burning w urination, tactile fever, no meds PTA. Pt advises that she "has trouble w capsules after weight loss surgery."

## 2022-05-15 NOTE — Discharge Instructions (Addendum)
You will need to follow-up on the results of urine culture as well as your chlamydia and gonorrhea pelvic swabs, which will likely take 3-4 business days to result.  If you test positive for any of these infections you should receive a phone call from the pharmacy staff notifying you of this.  You may need specific antibiotics at that time.  In the meantime, you were given a single dose treatment for a possible urine infection today.  We gave you fosfomycin antibiotic in the ER.  Your CT scan did not show any infectious or surgical emergencies, or any kidney stones, or clear cause of your symptoms.  Your potassium level was low at 2.8.  We gave you IV potassium in the ER.  It is important that you try to adhere to the potassium prescribed at home by your doctors office.  Reach out to your primary care doctor's office to have your potassium level rechecked this week if possible.

## 2022-05-15 NOTE — ED Notes (Signed)
Pelvic exam done by Dr. Langston Masker and Lavella Lemons - EMT and Faith - NT assisted.

## 2022-05-15 NOTE — ED Notes (Signed)
Pt verbalized understanding of all dc instructions

## 2022-05-15 NOTE — ED Provider Notes (Signed)
[facility] EMERGENCY DEPT Provider Note   CSN: [id number] Arrival date & time: 05/15/22  0147     History  Chief Complaint  Patient presents with   Back Pain    kidneys    Meagan Dean is a 52 y.o. female presenting to the ED with pain in her lower abdomen that radiates up into her flanks, ongoing for about 4 days.  She said that it feels like a kidney stone which she has had in the past, she is also having burning with urination and some pressure.  She said that her PCP had tried to start her on an antibiotic 2 to 3 days ago for potential UTI, but she was prescribed capsule tablets, and she has a difficult time  "swallowing the capsules".  She denies vaginal discharge reports she is sexually active with a new her female partner and they do have unprotected sex.  She said she did a urine test which was negative for chlamydia and gonorrhea as an outpatient.  She reports a history of an appendectomy and a partial hysterectomy, with her ovaries remaining.  HPI     Home Medications Prior to Admission medications   Medication Sig Start Date End Date Taking? Authorizing Provider  bacitracin ointment Apply 1 Application topically 2 (two) times daily. 02/09/22   Cardama, Grayce Sessions, MD  docusate sodium (COLACE) 100 MG capsule Take 100 mg by mouth daily.    [provider]  loratadine (CLARITIN) 10 MG tablet Take 10 mg by mouth daily.    [provider]  naproxen sodium (ALEVE) 220 MG tablet Take 440 mg by mouth every 6 (six) hours as needed (pain).    [provider]  ondansetron (ZOFRAN-ODT) 4 MG disintegrating tablet '4mg'$  ODT q4 hours prn nausea/vomit Patient not taking: Reported on 12/25/2021 10/07/21   Orpah Greek, MD  potassium chloride SA (KLOR-CON M20) 20 MEQ tablet Take 2 tablets (40 mEq total) by mouth daily. 04/25/22 07/24/22  Linward Natal, MD  Probiotic Product (PROBIOTIC PO) Take 1 capsule by mouth daily.    [provider]  sodium chloride (OCEAN) 0.65 % SOLN nasal spray Place 1 spray into both nostrils as needed for congestion.    [provider]      Allergies    Latex, Reglan [metoclopramide], and Vicodin [hydrocodone-acetaminophen]    Review of Systems   Review of Systems  Physical Exam Updated Vital Signs BP (!) 147/101   Pulse 67   Temp 98.5 F (36.9 C) (Oral)   Resp 18   LMP  (LMP Unknown) Comment: 07/2016  SpO2 98%  Physical Exam Constitutional:      General: She is not in acute distress. HENT:     Head: Normocephalic and atraumatic.  Eyes:     Conjunctiva/sclera: Conjunctivae normal.     Pupils: Pupils are equal, round, and reactive to light.  Cardiovascular:     Rate and Rhythm: Normal rate and regular rhythm.  Pulmonary:     Effort: Pulmonary effort is normal. No respiratory distress.  Abdominal:     General: There is no distension.     Tenderness: There is abdominal tenderness in the suprapubic area. There is no guarding. Negative signs include Murphy's sign.  Genitourinary:    Comments: Gu exam performed with technician chaperone present No visible discharge in the vaginal vault, no significant erythema or lacerations of the vaginal vault, no cervical motion tenderness Skin:    General: Skin is warm and dry.  Neurological:     General: No focal deficit present.     Mental Status: She is alert. Mental status is at baseline.  Psychiatric:        Mood and Affect: Mood normal.        Behavior: Behavior normal.     ED Results / Procedures / Treatments   Labs (all labs ordered are listed, but only abnormal results are displayed) Labs Reviewed  URINALYSIS, ROUTINE W REFLEX MICROSCOPIC - Abnormal; Notable for the following components:      Result Value   Leukocytes,Ua TRACE (*)    Bacteria, UA RARE (*)    All other components within normal limits  BASIC METABOLIC PANEL - Abnormal; Notable for the following components:   Potassium 2.8 (*)    All  other components within normal limits  CBC WITH DIFFERENTIAL/PLATELET - Abnormal; Notable for the following components:   Hemoglobin 11.8 (*)    HCT 35.5 (*)    All other components within normal limits  WET PREP, GENITAL  URINE CULTURE  MAGNESIUM  GC/CHLAMYDIA PROBE AMP ([place]) NOT AT [facility]    EKG None  Radiology CT Renal Stone Study  Result Date: 05/15/2022 CLINICAL DATA:  Flank pain radiating to the bilateral groin EXAM: CT ABDOMEN AND PELVIS WITHOUT CONTRAST TECHNIQUE: Multidetector CT imaging of the abdomen and pelvis was performed following the standard protocol without IV contrast. RADIATION DOSE REDUCTION: This exam was performed according to the departmental dose-optimization program which includes automated exposure control, adjustment of the mA and/or kV according to patient size and/or use of iterative reconstruction technique. COMPARISON:  CT Oct 07, 2021. FINDINGS: Lower chest: No acute abnormality. Similar fluid-filled distended distal esophagus. Hepatobiliary: Unremarkable noncontrast enhanced appearance of the hepatic parenchyma. Gallbladder is unremarkable. No biliary ductal dilation. Pancreas: No pancreatic ductal dilation or evidence of acute inflammation. Spleen: No splenomegaly. Adrenals/Urinary Tract: Bilateral adrenal glands appear normal. No hydronephrosis. No renal, ureteral or bladder calculi. Tiny calcification in the anterior bladder wall on image 36/6 is unchanged from prior examinations. Stomach/Bowel: Unchanged orientation of the gastric band with accessed port in the right paramedian anterior abdominal wall. No pathologic dilation of small or large bowel. Probable prior appendectomy. Vascular/Lymphatic: Normal caliber abdominal aorta. No pathologically enlarged abdominal or pelvic lymph nodes. Reproductive: Status post hysterectomy. No adnexal masses. Other: Umbilical hernia contains a nonobstructed portion small bowel, similar prior. Musculoskeletal: No acute  osseous abnormality. IMPRESSION: 1. No acute abnormality in the abdomen or pelvis. 2. No renal, ureteral or bladder calculi. 3. Umbilical hernia contains a nonobstructed portion of small bowel, similar prior. 4. Similar orientation of the gastric band device. 5. Fluid-filled distended distal esophagus. Electronically Signed   By: Dahlia Bailiff M.D.   On: 05/15/2022 08:51    Procedures Procedures    Medications Ordered in ED Medications  0.9 %  sodium chloride infusion (0 mLs Intravenous Stopped 05/15/22 1148)  ketorolac (TORADOL) 30 MG/ML injection 30 mg (30 mg Intravenous Given 05/15/22 0853)  fosfomycin (MONUROL) packet 3 g (3 g Oral Given 05/15/22 1019)  potassium chloride 10 mEq in 100 mL IVPB (0 mEq Intravenous Stopped 05/15/22 1148)    ED Course/ Medical Decision Making/ A&P Clinical Course as of 05/15/22 1615  Wed May 15, 2022  1004 Discussed patient's lab workup with her.  She is chronically hypokalemic but feels that her potassium has dropped even lower because she has had a hard time swallowing potassium pills at home.  We discussed round of IV potassium and  she is amenable to this in the ED.  I do not see evidence of BV.  We will wait for the results on her chlamydia and gonorrhea probe prior to empiric treatment as I have a relatively low suspicion that this is in fact the cause of her symptoms at this time.  I think is reasonable to treat for a potential UTI given that she has had these in the past, although I was clear that her urinalysis does not have very convincing signs of infection, with only trace leukocytes, negative nitrites, and rare bacteria.  She will follow-up on the urine culture, and her chlamydia gonorrhea swab. [MT]    Clinical Course User Index [MT] Yusra Ravert, Carola Rhine, MD                           Medical Decision Making Amount and/or Complexity of Data Reviewed Labs: ordered. Radiology: ordered.  Risk Prescription drug management.   This patient presents to the  ED with concern for suprapubic discomfort, flank pain. This involves an extensive number of treatment options, and is a complaint that carries with it a high risk of complications and morbidity.  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  History of Staphylococcus UTI diagnosed April 25 2022 treated with bactrim (urine culture is pan-sensitive)  I ordered and personally interpreted labs.  The pertinent results include:  K 2.8, wbc wnl; wet prep unremarkable, UA + leuks  I ordered imaging studies including ct renal stone study I independently visualized and interpreted imaging which showed no emergent findings as an examination for the patient's symptoms.  Specifically no renal stones or inflammatory bowel disease.  The patient does have new some distention of the distal esophagus, which she says is chronic issues related to her Lap-Band surgery. I agree with the radiologist interpretation  I ordered medication including IV toradol for abdominal pain  I have reviewed the patients home medicines and have made adjustments as needed  Test Considered: doubt mesenteric ischemia, and did not feel CT angiogram indicated; doubt ovarian torsion or PID/TOA, and I did not feel pelvic ultrasound emergently needed  After the interventions noted above, I reevaluated the patient and found that they have: improved   Dispostion:  After consideration of the diagnostic results and the patients response to treatment, I feel that the patent would benefit from outpatient f/u.         Final Clinical Impression(s) / ED Diagnoses Final diagnoses:  Hypokalemia  Dysuria  Abdominal discomfort     Rx / DC Orders ED Discharge Orders     None         Wyvonnia Dusky, MD 05/15/22 1616

## 2022-05-16 ENCOUNTER — Ambulatory Visit (INDEPENDENT_AMBULATORY_CARE_PROVIDER_SITE_OTHER): Payer: Commercial Managed Care - PPO

## 2022-05-16 ENCOUNTER — Other Ambulatory Visit: Payer: Self-pay

## 2022-05-16 VITALS — BP 140/91 | HR 89 | Temp 98.6°F | Ht 63.0 in | Wt 162.8 lb

## 2022-05-16 DIAGNOSIS — N390 Urinary tract infection, site not specified: Secondary | ICD-10-CM | POA: Diagnosis not present

## 2022-05-16 DIAGNOSIS — A499 Bacterial infection, unspecified: Secondary | ICD-10-CM

## 2022-05-16 DIAGNOSIS — E876 Hypokalemia: Secondary | ICD-10-CM | POA: Diagnosis not present

## 2022-05-16 DIAGNOSIS — I10 Essential (primary) hypertension: Secondary | ICD-10-CM | POA: Diagnosis not present

## 2022-05-16 DIAGNOSIS — Z0000 Encounter for general adult medical examination without abnormal findings: Secondary | ICD-10-CM

## 2022-05-16 LAB — URINE CULTURE: Culture: NO GROWTH

## 2022-05-16 LAB — GC/CHLAMYDIA PROBE AMP (~~LOC~~) NOT AT ARMC
Chlamydia: NEGATIVE
Comment: NEGATIVE
Comment: NORMAL
Neisseria Gonorrhea: NEGATIVE

## 2022-05-16 MED ORDER — POTASSIUM CHLORIDE CRYS ER 20 MEQ PO TBCR: 40.0000 meq | EXTENDED_RELEASE_TABLET | Freq: Every day | ORAL | 0 refills | Status: DC

## 2022-05-16 MED ORDER — DOXYCYCLINE MONOHYDRATE 100 MG PO TABS: 100.0000 mg | ORAL_TABLET | Freq: Two times a day (BID) | ORAL | 0 refills | Status: AC

## 2022-05-16 MED ORDER — LOSARTAN POTASSIUM 25 MG PO TABS: 25.0000 mg | ORAL_TABLET | Freq: Every day | ORAL | 11 refills | Status: DC

## 2022-05-16 NOTE — Patient Instructions (Addendum)
Meagan Dean, it was a pleasure seeing you today!  Today we discussed: UTI- take doxycycline twice daily for 3 days High blood pressure-take losartan once daily. Return in 2-4 weeks for blood pressure check, labs Need for pap smear- return in 2-4 weeks for pap smear Potassium- continue taking potassium 2 tablets a day  I have ordered the following labs today:  Lab Orders  No laboratory test(s) ordered today     Tests ordered today:  none  Referrals ordered today:   Referral Orders  No referral(s) requested today     I have ordered the following medication/changed the following medications:   Stop the following medications: Medications Discontinued During This Encounter  Medication Reason   potassium chloride SA (KLOR-CON M20) 20 MEQ tablet Reorder     Start the following medications: Meds ordered this encounter  Medications   doxycycline (ADOXA) 100 MG tablet    Sig: Take 1 tablet (100 mg total) by mouth 2 (two) times daily for 3 days.    Dispense:  6 tablet    Refill:  0   losartan (COZAAR) 25 MG tablet    Sig: Take 1 tablet (25 mg total) by mouth daily.    Dispense:  30 tablet    Refill:  11   potassium chloride SA (KLOR-CON M20) 20 MEQ tablet    Sig: Take 2 tablets (40 mEq total) by mouth daily.    Dispense:  180 tablet    Refill:  0     Follow-up:  2-4 weeks    Please make sure to arrive 15 minutes prior to your next appointment. If you arrive late, you may be asked to reschedule.   We look forward to seeing you next time. Please call our clinic at [phone] if you have any questions or concerns. The best time to call is Monday-Friday from 9am-4pm, but there is someone available 24/7. If after hours or the weekend, call the main hospital number and ask for the Internal Medicine Resident On-Call. If you need medication refills, please notify your pharmacy one week in advance and they will send [place] a request.  Thank you for letting us take part in your  care. Wishing you the best!  Thank you, Linward Natal, MD

## 2022-05-16 NOTE — Progress Notes (Signed)
CC: 1 month follow up   HPI:  [name] is a 52 y.o. with past medical history as below who presents for 1 month follow up.  Past Medical History:  Diagnosis Date   Anemia    ASCUS with positive high risk HPV cervical 03/15/2017   Asthma due to seasonal allergies    MILD-- JUST GOT INHALER-not used yet -allergy induced   Calculus of ureter 06/16/2013   Endometriosis of pelvis    Fibroids 09/02/2017   GERD (gastroesophageal reflux disease)    diet controlled   H/O hiatal hernia    Headache    MIGRAINES - none since weight loss surgery   History of endometriosis 03/28/2017   History of hypertension    NO ISSUES SINCE WT LOSS AFTER GASTRIC BANDING   History of kidney stones    surgery to remove   History of obstructive sleep apnea    DX'D PRIOR TO GASTRIC BANDING  2010 --  NO ISSUES SINCE WT LOSS   Hypertension 03/28/2017   Low potassium syndrome    hopsitalized for 3 days   Right ureteral stone    Seasonal allergies    Sinus headache    Sleep apnea    prior to weight loss surgery, does not have cpap   Urgency of urination    Review of Systems:  See detailed assessment and plan for pertinent ROS.  Physical Exam:  Vitals:   05/16/22 1338  BP: (!) 140/91  Pulse: 89  Temp: 98.6 F (37 C)  TempSrc: Oral  SpO2: 99%  Weight: 162 lb 12.8 oz (73.8 kg)  Height: '5\' 3"'$  (1.6 m)   Physical Exam Constitutional:      General: She is not in acute distress. HENT:     Head: Normocephalic and atraumatic.  Eyes:     Extraocular Movements: Extraocular movements intact.  Cardiovascular:     Rate and Rhythm: Normal rate and regular rhythm.     Heart sounds: No murmur heard. Pulmonary:     Effort: Pulmonary effort is normal.     Breath sounds: No wheezing, rhonchi or rales.  Abdominal:     General: Abdomen is flat.     Palpations: Abdomen is soft.     Tenderness: There is no abdominal tenderness. There is no right CVA tenderness or left CVA tenderness.   Musculoskeletal:     Cervical back: Neck supple.     Right lower leg: No edema.     Left lower leg: No edema.  Skin:    General: Skin is warm and dry.  Neurological:     Mental Status: She is alert and oriented to person, place, and time.  Psychiatric:        Mood and Affect: Mood normal.        Behavior: Behavior normal.      Assessment & Plan:   See Encounters Tab for problem based charting.  Hypertension BP Readings from Last 3 Encounters:  05/16/22 (!) 140/91  05/15/22 (!) 147/101  04/24/22 (!) 133/98  Blood pressure remains elevated today. Endorses headaches but she attributes this to allergies. Denies chest pain, shortness of breath. Heart and lung exam wnl, no edema. -start losartan 25 mg daily -return in 2-4 weeks for BP recheck, BMP  Hypokalemia Potassium 3.2 at last office visit. Increased daily supplementation to KCL 40 meq daily. Patient seen in ED for back pain yesterday and potassium found to be 2.8. Mg wnl. Received IV supplementation. Previous workup negative for  hyperaldosterone negative. Low suspicion for RTA without metabolic acidosis.  Low suspicion for little/Bartter syndrome without metabolic alkalosis.  She has no obvious GI losses or diuretic use.  She was on spironolactone but was discontinued twice as she does not like the way it makes her feel. -continue potassium tablets 40 meq daily -add losartan for hypertension, might benefit potassium as well -f/u in 2-4 weeks for repeat BMP  UTI (urinary tract infection), bacterial Patient evaluated at last OV on 12/14 for dysuria. Culture with staph lugdunensis. Sensitive to Bactrim and treated with this for 7 days. Patient states she was unable to tolerate capsules and only took about 2-3 days worth due to regurgitating. Presented to ED yesterday for back pain that she felt was either kidney stone or UTI. UA, urine culture, and CTAP unremarkable and not consistent with persistent UTI or kidney stone. STI testing  also negative. Today, patient denies dysuria or increased urinary frequency. Denies fevers or chills. Continues to endorse bilateral low back pain that radiates to groin. Afebrile. No CVA tenderness on exam. Feel pain most likely musculoskeletal (benefited from tramadol in ED yesterday briefly). However, given incomplete abx therapy, will add 3 days of doxycycline given prior susceptibilities.   -doxycycline 100 mg bid for 3 days  Routine health maintenance Patient states she was unable to schedule colonoscopy. She also requests a Pap smear to be completed at next OV. -new referral for colonoscopy at next OV -Pap smear at next OV    Patient discussed with Dr. Evette Doffing

## 2022-05-16 NOTE — Assessment & Plan Note (Addendum)
Potassium 3.2 at last office visit. Increased daily supplementation to KCL 40 meq daily. Patient seen in ED for back pain yesterday and potassium found to be 2.8. Mg wnl. Received IV supplementation. Previous workup negative for hyperaldosterone negative. Low suspicion for RTA without metabolic acidosis.  Low suspicion for little/Bartter syndrome without metabolic alkalosis.  She has no obvious GI losses or diuretic use.  She was on spironolactone but was discontinued twice as she does not like the way it makes her feel. -continue potassium tablets 40 meq daily -add losartan for hypertension, might benefit potassium as well -f/u in 2-4 weeks for repeat BMP

## 2022-05-16 NOTE — Assessment & Plan Note (Signed)
Patient states she was unable to schedule colonoscopy. She also requests a Pap smear to be completed at next OV. -new referral for colonoscopy at next OV -Pap smear at next OV

## 2022-05-16 NOTE — Assessment & Plan Note (Addendum)
Patient evaluated at last OV on 12/14 for dysuria. Culture with staph lugdunensis. Sensitive to Bactrim and treated with this for 7 days. Patient states she was unable to tolerate capsules and only took about 2-3 days worth due to regurgitating. Presented to ED yesterday for back pain that she felt was either kidney stone or UTI. UA, urine culture, and CTAP unremarkable and not consistent with persistent UTI or kidney stone. STI testing also negative. Today, patient denies dysuria or increased urinary frequency. Denies fevers or chills. Continues to endorse bilateral low back pain that radiates to groin. Afebrile. No CVA tenderness on exam. Feel pain most likely musculoskeletal (benefited from tramadol in ED yesterday briefly). However, given incomplete abx therapy, will add 3 days of doxycycline given prior susceptibilities.   -doxycycline 100 mg bid for 3 days

## 2022-05-16 NOTE — Assessment & Plan Note (Signed)
BP Readings from Last 3 Encounters:  05/16/22 (!) 140/91  05/15/22 (!) 147/101  04/24/22 (!) 133/98  Blood pressure remains elevated today. Endorses headaches but she attributes this to allergies. Denies chest pain, shortness of breath. Heart and lung exam wnl, no edema. -start losartan 25 mg daily -return in 2-4 weeks for BP recheck, BMP

## 2022-05-17 NOTE — Progress Notes (Signed)
Internal Medicine Clinic Attending  Case discussed with Dr. White  At the time of the visit.  We reviewed the resident's history and exam and pertinent patient test results.  I agree with the assessment, diagnosis, and plan of care documented in the resident's note.  

## 2022-05-23 ENCOUNTER — Other Ambulatory Visit: Payer: Self-pay

## 2022-05-23 ENCOUNTER — Ambulatory Visit (INDEPENDENT_AMBULATORY_CARE_PROVIDER_SITE_OTHER): Payer: Commercial Managed Care - PPO

## 2022-05-23 VITALS — BP 120/94 | HR 81 | Temp 99.2°F | Resp 24 | Ht 63.0 in | Wt 162.7 lb

## 2022-05-23 DIAGNOSIS — R109 Unspecified abdominal pain: Secondary | ICD-10-CM | POA: Diagnosis not present

## 2022-05-23 DIAGNOSIS — I10 Essential (primary) hypertension: Secondary | ICD-10-CM | POA: Diagnosis not present

## 2022-05-23 DIAGNOSIS — K5900 Constipation, unspecified: Secondary | ICD-10-CM | POA: Diagnosis not present

## 2022-05-23 DIAGNOSIS — E876 Hypokalemia: Secondary | ICD-10-CM | POA: Diagnosis not present

## 2022-05-23 MED ORDER — POLYETHYLENE GLYCOL 3350 17 G PO PACK: 17.0000 g | PACK | Freq: Every day | ORAL | 0 refills | Status: DC

## 2022-05-23 NOTE — Progress Notes (Signed)
CC: BP recheck  HPI:  [name] is a 52 y.o. with past medical history as below who presents for BP recheck. Please see detailed assessment and plan for HPI.  Past Medical History:  Diagnosis Date   Anemia    ASCUS with positive high risk HPV cervical 03/15/2017   Asthma due to seasonal allergies    MILD-- JUST GOT INHALER-not used yet -allergy induced   Calculus of ureter 06/16/2013   Endometriosis of pelvis    Fibroids 09/02/2017   GERD (gastroesophageal reflux disease)    diet controlled   H/O hiatal hernia    Headache    MIGRAINES - none since weight loss surgery   History of endometriosis 03/28/2017   History of hypertension    NO ISSUES SINCE WT LOSS AFTER GASTRIC BANDING   History of kidney stones    surgery to remove   History of obstructive sleep apnea    DX'D PRIOR TO GASTRIC BANDING  2010 --  NO ISSUES SINCE WT LOSS   Hypertension 03/28/2017   Low potassium syndrome    hopsitalized for 3 days   Right ureteral stone    Seasonal allergies    Sinus headache    Sleep apnea    prior to weight loss surgery, does not have cpap   Urgency of urination    Review of Systems:  See detailed assessment and plan for pertinent ROS.  Physical Exam:  Vitals:   05/23/22 1017 05/23/22 1025 05/23/22 1026  BP: (!) 126/96 (!) 114/96 (!) 120/94  Pulse: 94 72 81  Resp: (!) 24    Temp: 99.2 F (37.3 C)    TempSrc: Oral    SpO2: 100%    Weight: 162 lb 11.2 oz (73.8 kg)    Height: '5\' 3"'$  (1.6 m)     Physical Exam Constitutional:      General: She is not in acute distress. HENT:     Head: Normocephalic and atraumatic.  Cardiovascular:     Rate and Rhythm: Normal rate and regular rhythm.  Pulmonary:     Effort: Pulmonary effort is normal.     Breath sounds: No wheezing or rales.  Abdominal:     General: Abdomen is flat.     Palpations: Abdomen is soft.     Tenderness: There is abdominal tenderness in the periumbilical area. There is no guarding or rebound.      Hernia: No hernia is present.  Skin:    General: Skin is warm and dry.  Neurological:     Mental Status: She is alert and oriented to person, place, and time.  Psychiatric:        Mood and Affect: Mood normal.        Behavior: Behavior normal.      Assessment & Plan:   See Encounters Tab for problem based charting.  Hypertension BP Readings from Last 3 Encounters:  05/23/22 (!) 120/94  05/16/22 (!) 140/91  05/15/22 (!) 147/101  Patient presents for blood pressure recheck after starting losartan 25 mg daily at last visit 7 days ago.  Pressure much improved today.  Systolic at goal, the diastolic still slightly elevated.  Denies headaches, chest pain, vision changes. -Continue losartan 25 mg daily   Hypokalemia Patient presents for follow-up of her hypokalemia, potassium at recent ED visit 2.8 despite KCl 40 meq daily.  Magnesium within normal limits.  Previous workup negative for hyperaldosteronism.  Unlikely from tubular acidosis given absence of acidosis.  She does have  a history of gastric banding and frequent SBOs but denies any changes in her bowel movements that might suggest GI losses.  She is not on a diuretic.  Continues to endorse leg cramps.  Added losartan at last visit with hopes that this might augment therapy. -Recheck BMP today, if still hypokalemic will increase potassium supplementation  Abdominal pain in female Patient continues with lower abdominal pain previously thought to be related to her UTI.  Of note, patient has history of total hysterectomy in the setting of pelvic pain and endometriosis.  Also with a history of a periumbilical hernia demonstrated on CT recently.  This is nonobstructive and was previously reducible on exam.  Unable to palpate today.  Also on recent CT was a very small bladder stone that I do not feel is responsible for symptoms.  Denies fevers or chills, afebrile today.  Abdominal exam today with periumbilical tenderness but no distention,  rebound tenderness, or guarding.  Feel her symptoms are most likely related to constipation as she only has a few bowel movements a week. -Trial of MiraLAX    Patient discussed with Dr. Philipp Ovens

## 2022-05-23 NOTE — Patient Instructions (Signed)
[name], it was a pleasure seeing you today!  Today we discussed: Lower abdominal pain - Take Miralax. Start at half a cap full a day. If needed, add a half cap full each day until you are having daily bowel movements. Take no more than four cap fulls a day.  I have ordered the following labs today:  Lab Orders         BMP8+Anion Gap      Tests ordered today:  none  Referrals ordered today:   Referral Orders  No referral(s) requested today     I have ordered the following medication/changed the following medications:   Stop the following medications: Medications Discontinued During This Encounter  Medication Reason   polyethylene glycol (MIRALAX) 17 g packet Reorder     Start the following medications: Meds ordered this encounter  Medications   DISCONTD: polyethylene glycol (MIRALAX) 17 g packet    Sig: Take 17 g by mouth daily. Start at half a cap full a day. If needed, add a cap full each day until you are having daily bowel movements. Take no more than four cap fulls a day.    Dispense:  14 each    Refill:  0   polyethylene glycol (MIRALAX) 17 g packet    Sig: Take 17 g by mouth daily. Start at half a cap full a day. If needed, add a half cap full each day until you are having daily bowel movements. Take no more than four cap fulls a day.    Dispense:  14 each    Refill:  0     Follow-up: 6 months   Please make sure to arrive 15 minutes prior to your next appointment. If you arrive late, you may be asked to reschedule.   We look forward to seeing you next time. Please call our clinic at [phone] if you have any questions or concerns. The best time to call is Monday-Friday from 9am-4pm, but there is someone available 24/7. If after hours or the weekend, call the main hospital number and ask for the Internal Medicine Resident On-Call. If you need medication refills, please notify your pharmacy one week in advance and they will send [place] a request.  Thank you  for letting us take part in your care. Wishing you the best!  Thank you, Linward Natal, MD

## 2022-05-23 NOTE — Assessment & Plan Note (Signed)
BP Readings from Last 3 Encounters:  05/23/22 (!) 120/94  05/16/22 (!) 140/91  05/15/22 (!) 147/101  Patient presents for blood pressure recheck after starting losartan 25 mg daily at last visit 7 days ago.  Pressure much improved today.  Systolic at goal, the diastolic still slightly elevated.  Denies headaches, chest pain, vision changes. -Continue losartan 25 mg daily

## 2022-05-23 NOTE — Assessment & Plan Note (Signed)
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  Magnesium within normal limits.  Previous workup negative for hyperaldosteronism.  Unlikely from tubular acidosis given absence of acidosis.  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  She is not on a diuretic.  Continues to endorse leg cramps.  Added losartan at last visit with hopes that this might augment therapy. -Recheck BMP today, if still hypokalemic will increase potassium supplementation

## 2022-05-23 NOTE — Assessment & Plan Note (Signed)
Patient continues with lower abdominal pain previously thought to be related to her UTI.  Of note, patient has history of total hysterectomy in the setting of pelvic pain and endometriosis.  Also with a history of a periumbilical hernia demonstrated on CT recently.  This is nonobstructive and was previously reducible on exam.  Unable to palpate today.  Also on recent CT was a very small bladder stone that I do not feel is responsible for symptoms.  Denies fevers or chills, afebrile today.  Abdominal exam today with periumbilical tenderness but no distention, rebound tenderness, or guarding.  Feel her symptoms are most likely related to constipation as she only has a few bowel movements a week. -Trial of MiraLAX

## 2022-05-24 LAB — BMP8+ANION GAP
Anion Gap: 15 mmol/L (ref 10.0–18.0)
BUN/Creatinine Ratio: 9 (ref 9–23)
BUN: 7 mg/dL (ref 6–24)
CO2: 24 mmol/L (ref 20–29)
Calcium: 9.3 mg/dL (ref 8.7–10.2)
Chloride: 101 mmol/L (ref 96–106)
Creatinine, Ser: 0.82 mg/dL (ref 0.57–1.00)
Glucose: 74 mg/dL (ref 70–99)
Potassium: 3.5 mmol/L (ref 3.5–5.2)
Sodium: 140 mmol/L (ref 134–144)
eGFR: 87 mL/min/{1.73_m2} (ref >59)

## 2022-05-24 NOTE — Progress Notes (Signed)
Internal Medicine Clinic Attending  Case discussed with Dr. White  At the time of the visit.  We reviewed the resident's history and exam and pertinent patient test results.  I agree with the assessment, diagnosis, and plan of care documented in the resident's note.  

## 2022-06-26 ENCOUNTER — Emergency Department (HOSPITAL_COMMUNITY): Payer: Commercial Managed Care - PPO

## 2022-06-26 ENCOUNTER — Other Ambulatory Visit: Payer: Self-pay

## 2022-06-26 ENCOUNTER — Emergency Department (HOSPITAL_COMMUNITY)
Admission: EM | Admit: 2022-06-26 | Discharge: 2022-06-26 | Disposition: A | Discharge status: Home / Self Care | Payer: Commercial Managed Care - PPO | Attending: Emergency Medicine | Admitting: Emergency Medicine

## 2022-06-26 DIAGNOSIS — Z91040 Latex allergy status: Secondary | ICD-10-CM | POA: Diagnosis not present

## 2022-06-26 DIAGNOSIS — Z79899 Other long term (current) drug therapy: Secondary | ICD-10-CM | POA: Insufficient documentation

## 2022-06-26 DIAGNOSIS — I10 Essential (primary) hypertension: Secondary | ICD-10-CM | POA: Diagnosis not present

## 2022-06-26 DIAGNOSIS — R1084 Generalized abdominal pain: Secondary | ICD-10-CM | POA: Diagnosis not present

## 2022-06-26 DIAGNOSIS — R109 Unspecified abdominal pain: Secondary | ICD-10-CM | POA: Diagnosis present

## 2022-06-26 DIAGNOSIS — Z1152 Encounter for screening for COVID-19: Secondary | ICD-10-CM | POA: Diagnosis not present

## 2022-06-26 LAB — COMPREHENSIVE METABOLIC PANEL
ALT: 9 U/L (ref 0–44)
AST: 21 U/L (ref 15–41)
Albumin: 3.9 g/dL (ref 3.5–5.0)
Alkaline Phosphatase: 62 U/L (ref 38–126)
Anion gap: 13 (ref 5–15)
BUN: 9 mg/dL (ref 6–20)
CO2: 23 mmol/L (ref 22–32)
Calcium: 8.8 mg/dL — ABNORMAL LOW (ref 8.9–10.3)
Chloride: 102 mmol/L (ref 98–111)
Creatinine, Ser: 0.82 mg/dL (ref 0.44–1.00)
GFR, Estimated: >60 mL/min (ref >60)
Glucose, Bld: 88 mg/dL (ref 70–99)
Potassium: 3.1 mmol/L — ABNORMAL LOW (ref 3.5–5.1)
Sodium: 138 mmol/L (ref 135–145)
Total Bilirubin: 0.7 mg/dL (ref 0.3–1.2)
Total Protein: 7.3 g/dL (ref 6.5–8.1)

## 2022-06-26 LAB — URINALYSIS, ROUTINE W REFLEX MICROSCOPIC
Bilirubin Urine: NEGATIVE
Glucose, UA: NEGATIVE mg/dL
Hgb urine dipstick: NEGATIVE
Ketones, ur: 5 mg/dL — AB
Nitrite: NEGATIVE
Protein, ur: 30 mg/dL — AB
Specific Gravity, Urine: 1.032 — ABNORMAL HIGH (ref 1.005–1.030)
pH: 5 (ref 5.0–8.0)

## 2022-06-26 LAB — CBC
HCT: 36.7 % (ref 36.0–46.0)
Hemoglobin: 11.8 g/dL — ABNORMAL LOW (ref 12.0–15.0)
MCH: 28.1 pg (ref 26.0–34.0)
MCHC: 32.2 g/dL (ref 30.0–36.0)
MCV: 87.4 fL (ref 80.0–100.0)
Platelets: 387 10*3/uL (ref 150–400)
RBC: 4.2 MIL/uL (ref 3.87–5.11)
RDW: 14.7 % (ref 11.5–15.5)
WBC: 7.8 10*3/uL (ref 4.0–10.5)
nRBC: 0 % (ref 0.0–0.2)

## 2022-06-26 LAB — RESP PANEL BY RT-PCR (RSV, FLU A&B, COVID)  RVPGX2
Influenza A by PCR: NEGATIVE
Influenza B by PCR: NEGATIVE
Resp Syncytial Virus by PCR: NEGATIVE
SARS Coronavirus 2 by RT PCR: NEGATIVE

## 2022-06-26 LAB — LIPASE, BLOOD: Lipase: 44 U/L (ref 11–51)

## 2022-06-26 LAB — HCG, QUANTITATIVE, PREGNANCY: hCG, Beta Chain, Quant, S: 1 m[IU]/mL (ref <5)

## 2022-06-26 MED ORDER — POTASSIUM CHLORIDE 10 MEQ/100ML IV SOLN
10.0000 meq | Freq: Once | INTRAVENOUS | Status: AC
  Administered 2022-06-26: 10 meq via INTRAVENOUS
  Filled 2022-06-26: qty 100

## 2022-06-26 MED ORDER — SODIUM CHLORIDE 0.9 % IV BOLUS
1000.0000 mL | Freq: Once | INTRAVENOUS | Status: AC
  Administered 2022-06-26: 1000 mL via INTRAVENOUS

## 2022-06-26 MED ORDER — CEPHALEXIN 500 MG PO CAPS: 500.0000 mg | ORAL_CAPSULE | Freq: Four times a day (QID) | ORAL | 0 refills | Status: AC

## 2022-06-26 MED ORDER — IOHEXOL 300 MG/ML  SOLN
100.0000 mL | Freq: Once | INTRAMUSCULAR | Status: AC | PRN
  Administered 2022-06-26: 100 mL via INTRAVENOUS

## 2022-06-26 MED ORDER — CEPHALEXIN 500 MG PO CAPS
500.0000 mg | ORAL_CAPSULE | Freq: Once | ORAL | Status: AC
  Administered 2022-06-26: 500 mg via ORAL
  Filled 2022-06-26: qty 1

## 2022-06-26 MED ORDER — SODIUM CHLORIDE 0.9 % IV SOLN
25.0000 mg | Freq: Four times a day (QID) | INTRAVENOUS | Status: DC | PRN
  Administered 2022-06-26: 25 mg via INTRAVENOUS
  Filled 2022-06-26: qty 25

## 2022-06-26 MED ORDER — ONDANSETRON 4 MG PO TBDP
4.0000 mg | ORAL_TABLET | Freq: Once | ORAL | Status: AC
  Administered 2022-06-26: 4 mg via ORAL
  Filled 2022-06-26: qty 1

## 2022-06-26 NOTE — ED Provider Notes (Signed)
[place] EMERGENCY DEPARTMENT AT [facility] Provider Note   CSN: [id number] Arrival date & time: 06/26/22  1649     History  Chief Complaint  Patient presents with   Abdominal Pain    Meagan Dean is a 52 y.o. female.  With past medical history of hypokalemia, small bowel obstruction, hypertension who presents to the emergency department with abdominal pain.  Patient states that symptoms began yesterday. She describes having spasming in her abdomen. She states she had non bloody diarrhea yesterday, which stopped suddenly and has since had nausea without vomiting. She has not eaten today and endorses decreased appetite. She states this feels similar to previous obstructions. Also endorsing burning with urination. She denies fevers.     Abdominal Pain Associated symptoms: diarrhea and nausea        Home Medications Prior to Admission medications   Medication Sig Start Date End Date Taking? Authorizing Provider  cephALEXin (KEFLEX) 500 MG capsule Take 1 capsule (500 mg total) by mouth 4 (four) times daily for 5 days. 06/26/22 07/01/22 Yes Mickie Hillier, PA-C  bacitracin ointment Apply 1 Application topically 2 (two) times daily. 02/09/22   Cardama, Grayce Sessions, MD  docusate sodium (COLACE) 100 MG capsule Take 100 mg by mouth daily.    [provider]  loratadine (CLARITIN) 10 MG tablet Take 10 mg by mouth daily.    [provider]  losartan (COZAAR) 25 MG tablet Take 1 tablet (25 mg total) by mouth daily. 05/16/22 05/16/23  Linward Natal, MD  naproxen sodium (ALEVE) 220 MG tablet Take 440 mg by mouth every 6 (six) hours as needed (pain).    [provider]  ondansetron (ZOFRAN-ODT) 4 MG disintegrating tablet 58m ODT q4 hours prn nausea/vomit Patient not taking: Reported on 12/25/2021 10/07/21   POrpah Greek MD  polyethylene glycol (MIRALAX) 17 g packet Take 17 g by mouth daily. Start at half a cap full a day. If needed, add a half  cap full each day until you are having daily bowel movements. Take no more than four cap fulls a day. 05/23/22   WLinward Natal MD  potassium chloride SA (KLOR-CON M20) 20 MEQ tablet Take 2 tablets (40 mEq total) by mouth daily. 05/16/22 08/14/22  WLinward Natal MD  Probiotic Product (PROBIOTIC PO) Take 1 capsule by mouth daily.    [provider]  sodium chloride (OCEAN) 0.65 % SOLN nasal spray Place 1 spray into both nostrils as needed for congestion.    [provider]      Allergies    Latex, Reglan [metoclopramide], and Vicodin [hydrocodone-acetaminophen]    Review of Systems   Review of Systems  Gastrointestinal:  Positive for abdominal distention, abdominal pain, diarrhea and nausea.  All other systems reviewed and are negative.   Physical Exam Updated Vital Signs BP (!) 134/102 (BP Location: Left Arm)   Pulse 91   Temp 98.7 F (37.1 C) (Oral)   Resp 18   LMP  (LMP Unknown) Comment: 07/2016  SpO2 92%  Physical Exam Vitals and nursing note reviewed.  Constitutional:      General: She is not in acute distress.    Appearance: Normal appearance. She is well-developed. She is not ill-appearing or toxic-appearing.  HENT:     Head: Normocephalic.     Mouth/Throat:     Mouth: Mucous membranes are moist.     Pharynx: Oropharynx is clear.  Eyes:     General: No scleral icterus.  Extraocular Movements: Extraocular movements intact.  Cardiovascular:     Rate and Rhythm: Normal rate and regular rhythm.     Heart sounds: Normal heart sounds. No murmur heard. Pulmonary:     Effort: Pulmonary effort is normal. No respiratory distress.     Breath sounds: Normal breath sounds.  Abdominal:     General: Abdomen is protuberant. Bowel sounds are decreased. There is no distension.     Palpations: Abdomen is soft.     Tenderness: There is generalized abdominal tenderness. There is guarding. There is no rebound.  Musculoskeletal:     Cervical back: Neck supple.   Skin:    General: Skin is warm and dry.     Capillary Refill: Capillary refill takes less than 2 seconds.  Neurological:     General: No focal deficit present.     Mental Status: She is alert and oriented to person, place, and time. Mental status is at baseline.  Psychiatric:        Mood and Affect: Mood normal.        Behavior: Behavior normal.        Thought Content: Thought content normal.        Judgment: Judgment normal.     ED Results / Procedures / Treatments   Labs (all labs ordered are listed, but only abnormal results are displayed) Labs Reviewed  COMPREHENSIVE METABOLIC PANEL - Abnormal; Notable for the following components:      Result Value   Potassium 3.1 (*)    Calcium 8.8 (*)    All other components within normal limits  CBC - Abnormal; Notable for the following components:   Hemoglobin 11.8 (*)    All other components within normal limits  URINALYSIS, ROUTINE W REFLEX MICROSCOPIC - Abnormal; Notable for the following components:   Color, Urine AMBER (*)    Specific Gravity, Urine 1.032 (*)    Ketones, ur 5 (*)    Protein, ur 30 (*)    Leukocytes,Ua TRACE (*)    Bacteria, UA RARE (*)    All other components within normal limits  RESP PANEL BY RT-PCR (RSV, FLU A&B, COVID)  [id number]  URINE CULTURE  LIPASE, BLOOD  HCG, QUANTITATIVE, PREGNANCY    EKG None  Radiology CT ABDOMEN PELVIS W CONTRAST  Result Date: 06/26/2022 CLINICAL DATA:  Left lower quadrant pain. Bowel obstruction suspected. EXAM: CT ABDOMEN AND PELVIS WITH CONTRAST TECHNIQUE: Multidetector CT imaging of the abdomen and pelvis was performed using the standard protocol following bolus administration of intravenous contrast. RADIATION DOSE REDUCTION: This exam was performed according to the departmental dose-optimization program which includes automated exposure control, adjustment of the mA and/or kV according to patient size and/or use of iterative reconstruction technique. CONTRAST:  17m  OMNIPAQUE IOHEXOL 300 MG/ML  SOLN COMPARISON:  05/15/2022 FINDINGS: Lower chest: Lung bases are clear. Moderate-sized esophageal hiatal hernia. There is an air-fluid level in the hernia suggesting possible reflux or dysmotility. Possibly outlet obstruction. Hepatobiliary: No focal liver abnormality is seen. No gallstones, gallbladder wall thickening, or biliary dilatation. Pancreas: Unremarkable. No pancreatic ductal dilatation or surrounding inflammatory changes. Spleen: Normal in size without focal abnormality. Adrenals/Urinary Tract: Adrenal glands are unremarkable. Kidneys are normal, without renal calculi, focal lesion, or hydronephrosis. Bladder is unremarkable. Stomach/Bowel: A gastric lap band is present. Stomach is fluid-filled but decompressed. No small or large bowel distention. No wall thickening or inflammatory changes suggested. There is a moderate-sized periumbilical hernia containing a loop of small bowel but without evidence of  proximal obstruction. Appearance is similar to prior study. Surgical absence of the appendix. Vascular/Lymphatic: No significant vascular findings are present. No enlarged abdominal or pelvic lymph nodes. Reproductive: Status post hysterectomy. No adnexal masses. Other: No free air or free fluid in the abdomen. Musculoskeletal: No acute or significant osseous findings. IMPRESSION: 1. Postoperative changes with gastric lap band. Mildly dilated fluid-filled lower esophagus may indicate outlet obstruction, reflux, or dysmotility. 2. Moderate periumbilical hernia containing small bowel without proximal obstruction, similar to prior study. 3. No evidence of bowel obstruction or inflammation. Electronically Signed   By: Lucienne Capers M.D.   On: 06/26/2022 21:26    Procedures Procedures   Medications Ordered in ED Medications  promethazine (PHENERGAN) 25 mg in sodium chloride 0.9 % 50 mL IVPB (has no administration in time range)  potassium chloride 10 mEq in 100 mL IVPB  (has no administration in time range)  cephALEXin (KEFLEX) capsule 500 mg (has no administration in time range)  ondansetron (ZOFRAN-ODT) disintegrating tablet 4 mg (4 mg Oral Given 06/26/22 1754)  sodium chloride 0.9 % bolus 1,000 mL (1,000 mLs Intravenous New Bag/Given 06/26/22 2052)  iohexol (OMNIPAQUE) 300 MG/ML solution 100 mL (100 mLs Intravenous Contrast Given 06/26/22 2107)    ED Course/ Medical Decision Making/ A&P    Medical Decision Making Amount and/or Complexity of Data Reviewed Labs: ordered.  Risk Prescription drug management.  51 year old female presenting with abdominal cramping with history of bowel obstruction, lap band surgery. Overall well appearing  CT shows no evidence of bowel obstruction or other acute findings. She does have incidental possible hiatal hernia which can be followed up in the outpatient setting. K is 3.1, being replaced here, and may be causing some of her cramping pain.  Nauseated and received zofran and phenergan with improvement. She does have UTI - doubt pyelonephritis and no evidence on imaging. Given Keflex here and Rx for same. Urine culture sent. I have low suspicion for other etiologies such as acute hepatobiliary disease, pancreatitis, appendicitis, PUD, gastritis, diverticulitis, colitis, viral gastroenteritis, Crohn's, UC, vascular catastrophe, renal stone, obstructed stone, infected stone, ovarian torsion, ectopic pregnancy, TOA, PID, STD, etc.   Discussed follow-up with general surgery for hernia, PCP for K replacement. Finish antibiotics. Return precautions given. The patient has been appropriately medically screened and/or stabilized in the ED. I have low suspicion for any other emergent medical condition which would require further screening, evaluation or treatment in the ED or require inpatient management. At time of discharge the patient is hemodynamically stable and in no acute distress. I have discussed work-up results and diagnosis  with patient and answered all questions. Patient is agreeable with discharge plan. We discussed strict return precautions for returning to the emergency department and they verbalized understanding.    Final Clinical Impression(s) / ED Diagnoses Final diagnoses:  Generalized abdominal pain    Rx / DC Orders ED Discharge Orders          Ordered    cephALEXin (KEFLEX) 500 MG capsule  4 times daily        06/26/22 2153              Mickie Hillier, PA-C 06/26/22 2206    Margette Fast, MD 06/26/22 2224

## 2022-06-26 NOTE — Discharge Instructions (Addendum)
You were seen in the emergency department today for abdominal pain. You CT scan did not show a bowel obstruction. It did show you have a possible small hiatal hernia which may be related to your lap band procedure. Please follow-up with Dr. Hassell Done regarding these findings if there is anything to be done about this. Additionally, you have a urinary tract infection. I have sent a prescription for Keflex, an antibiotic, that you will take for the next 5 days. Please take until completion. Your potassium was 3.1, which is mildly low and we replaced that through your IV here today. Please continue taking your potassium at home as prescribed. Please return for worsening symptoms.

## 2022-06-26 NOTE — ED Triage Notes (Signed)
Pt states she has had abdominal "spasms" and has a hx of bowel obstruction.  Diarrhea yesterday  No appetite today.  Burning with urination.

## 2022-06-26 NOTE — ED Provider Triage Note (Addendum)
Emergency Medicine Provider Triage Evaluation Note  Meagan Dean , a 52 y.o. female  was evaluated in triage.  Pt complains of abdominal spasms, decreased appetite, nausea, and vomiting x 2 days. Last bowel movement yesterday -- multiple episodes of diarrhea. History of multiple bowel obstructions with last 1 year ago managed medically inpatient. She denies fever, chills, chest pain, shortness of breath, dysuria. Also c/o muscle cramps, history of hypokalemia but been unable to take potassium for a couple of days. Takes stool softener daily due to history.   Review of Systems  Positive: See HPI Negative: See HPI  Physical Exam  BP (!) 156/107 (BP Location: Right Arm)   Pulse 79   Temp 99.2 F (37.3 C) (Oral)   Resp 18   LMP  (LMP Unknown) Comment: 07/2016  SpO2 100%  Gen:   Awake, no distress   Resp:  Normal effort  MSK:   Moves extremities without difficulty  Other:  Abomen soft, moderate LLQ ttp, no guarding or rebound  Medical Decision Making  Medically screening exam initiated at 5:42 PM.  Appropriate orders placed.  [name] was informed that the remainder of the evaluation will be completed by another provider, this initial triage assessment does not replace that evaluation, and the importance of remaining in the ED until their evaluation is complete.     Suzzette Righter, PA-C 06/26/22 1745    Suzzette Righter, PA-C 06/26/22 1747

## 2022-06-29 LAB — URINE CULTURE: Culture: >=100000 — AB

## 2022-06-30 ENCOUNTER — Telehealth (HOSPITAL_BASED_OUTPATIENT_CLINIC_OR_DEPARTMENT_OTHER): Payer: Self-pay | Admitting: *Deleted

## 2022-06-30 NOTE — Progress Notes (Signed)
ED Antimicrobial Stewardship Positive Culture Follow Up   Meagan Dean is an 52 y.o. female who presented to [facility] on 06/26/2022 with a chief complaint of  Chief Complaint  Patient presents with   Abdominal Pain    Recent Results (from the past 720 hour(s))  Resp panel by RT-PCR (RSV, Flu A&B, Covid) Anterior Nasal Swab     Status: None   Collection Time: 06/26/22  8:47 PM   Specimen: Anterior Nasal Swab  Result Value Ref Range Status   SARS Coronavirus 2 by RT PCR NEGATIVE NEGATIVE Final    Comment: (NOTE) SARS-CoV-2 target nucleic acids are NOT DETECTED.  The SARS-CoV-2 RNA is generally detectable in upper respiratory specimens during the acute phase of infection. The lowest concentration of SARS-CoV-2 viral copies this assay can detect is 138 copies/mL. A negative result does not preclude SARS-Cov-2 infection and should not be used as the sole basis for treatment or other patient management decisions. A negative result may occur with  improper specimen collection/handling, submission of specimen other than nasopharyngeal swab, presence of viral mutation(s) within the areas targeted by this assay, and inadequate number of viral copies(<138 copies/mL). A negative result must be combined with clinical observations, patient history, and epidemiological information. The expected result is Negative.  Fact Sheet for Patients:  EntrepreneurPulse.com.au  Fact Sheet for Healthcare Providers:  IncredibleEmployment.be  This test is no t yet approved or cleared by the [place] FDA and  has been authorized for detection and/or diagnosis of SARS-CoV-2 by FDA under an Emergency Use Authorization (EUA). This EUA will remain  in effect (meaning this test can be used) for the duration of the COVID-19 declaration under Section 564(b)(1) of the Act, 21 U.S.C.section 360bbb-3(b)(1), unless the authorization is terminated  or revoked sooner.        Influenza A by PCR NEGATIVE NEGATIVE Final   Influenza B by PCR NEGATIVE NEGATIVE Final    Comment: (NOTE) The Xpert Xpress SARS-CoV-2/FLU/RSV plus assay is intended as an aid in the diagnosis of influenza from Nasopharyngeal swab specimens and should not be used as a sole basis for treatment. Nasal washings and aspirates are unacceptable for Xpert Xpress SARS-CoV-2/FLU/RSV testing.  Fact Sheet for Patients: EntrepreneurPulse.com.au  Fact Sheet for Healthcare Providers: IncredibleEmployment.be  This test is not yet approved or cleared by the [place] FDA and has been authorized for detection and/or diagnosis of SARS-CoV-2 by FDA under an Emergency Use Authorization (EUA). This EUA will remain in effect (meaning this test can be used) for the duration of the COVID-19 declaration under Section 564(b)(1) of the Act, 21 U.S.C. section 360bbb-3(b)(1), unless the authorization is terminated or revoked.     Resp Syncytial Virus by PCR NEGATIVE NEGATIVE Final    Comment: (NOTE) Fact Sheet for Patients: EntrepreneurPulse.com.au  Fact Sheet for Healthcare Providers: IncredibleEmployment.be  This test is not yet approved or cleared by the [place] FDA and has been authorized for detection and/or diagnosis of SARS-CoV-2 by FDA under an Emergency Use Authorization (EUA). This EUA will remain in effect (meaning this test can be used) for the duration of the COVID-19 declaration under Section 564(b)(1) of the Act, 21 U.S.C. section 360bbb-3(b)(1), unless the authorization is terminated or revoked.  Performed at [facility], [place] [address] [place], [place] [address]   Urine Culture (for pregnant, neutropenic or urologic patients or patients with an indwelling urinary catheter)     Status: Abnormal   Collection Time: 06/26/22  9:55 PM  Specimen: Urine, Clean Catch  Result Value Ref  Range Status   Specimen Description   Final    URINE, CLEAN CATCH Performed at [facility], [place] [address] [place], [place] [address]    Special Requests   Final    NONE Performed at [facility], [place] [address] [place], [place] [address]    Culture >=100,000 COLONIES/mL STAPHYLOCOCCUS LUGDUNENSIS (A)  Final   Report Status 06/29/2022 FINAL  Final   Organism ID, Bacteria STAPHYLOCOCCUS LUGDUNENSIS (A)  Final      Susceptibility   Staphylococcus lugdunensis - MIC*    CIPROFLOXACIN <=0.5 SENSITIVE Sensitive     GENTAMICIN <=0.5 SENSITIVE Sensitive     NITROFURANTOIN <=16 SENSITIVE Sensitive     OXACILLIN >=4 RESISTANT Resistant     TETRACYCLINE <=1 SENSITIVE Sensitive     VANCOMYCIN <=0.5 SENSITIVE Sensitive     TRIMETH/SULFA <=10 SENSITIVE Sensitive     CLINDAMYCIN <=0.25 SENSITIVE Sensitive     RIFAMPIN <=0.5 SENSITIVE Sensitive     Inducible Clindamycin NEGATIVE Sensitive     * >=100,000 COLONIES/mL STAPHYLOCOCCUS LUGDUNENSIS    [x]$  Treated with Keflex, organism resistant to prescribed antimicrobial  New antibiotic prescription: Macrobid 100 mg PO bid x 5 days (#10)  ED Provider: Margaretmary Lombard, PA-C   Meagan Dean A 06/30/2022, 2:23 PM Clinical Pharmacist [phone]

## 2022-06-30 NOTE — Telephone Encounter (Signed)
Post ED Visit - Positive Culture Follow-up: Successful Patient Follow-Up  Culture assessed and recommendations reviewed by:  [x]$  Larwance Sachs, Pharm.D. []$  Heide Guile, Pharm.D., BCPS AQ-ID []$  Parks Neptune, Pharm.D., BCPS []$  Alycia Rossetti, Pharm.D., BCPS []$  [place], [place].D., BCPS, AAHIVP []$  Legrand Como, Pharm.D., BCPS, AAHIVP []$  Salome Arnt, PharmD, BCPS []$  Johnnette Gourd, PharmD, BCPS []$  Hughes Better, PharmD, BCPS []$  Leeroy Cha, PharmD  Positive urine culture  []$  Patient discharged without antimicrobial prescription and treatment is now indicated [x]$  Organism is resistant to prescribed ED discharge antimicrobial []$  Patient with positive blood cultures  Changes discussed with ED provider: Margaretmary Lombard, PA-C New antibiotic prescription Macrobid 100 mg BID x 5 days Stop Keflex Called to [facility] Dr. Lady Gary  Contacted patient, date 06/30/22, time 51   Rosie Fate 06/30/2022, 4:28 PM

## 2022-06-30 NOTE — Telephone Encounter (Signed)
Post ED Visit - Positive Culture Follow-up: Unsuccessful Patient Follow-up  Culture assessed and recommendations reviewed by:  [x]$  Johnnette Barrios, Pharm.D. []$  Heide Guile, Pharm.D., BCPS AQ-ID []$  Parks Neptune, Pharm.D., BCPS []$  Alycia Rossetti, Pharm.D., BCPS []$  [place], Pharm.D., BCPS, AAHIVP []$  Legrand Como, Pharm.D., BCPS, AAHIVP []$  Wynell Balloon, PharmD []$  Vincenza Hews, PharmD, BCPS  Positive urine culture  []$  Patient discharged without antimicrobial prescription and treatment is now indicated [x]$  Organism is resistant to prescribed ED discharge antimicrobial []$  Patient with positive blood cultures  Stop Keflex. Start Macrobid 117m BID x 5days per Lorin Roemhildt, PA-c  Unable to contact patien letter will be sent to address on file  LRosie Fate2/18/2024, 4:07 PM

## 2022-07-02 ENCOUNTER — Telehealth: Payer: Self-pay | Admitting: *Deleted

## 2022-07-02 NOTE — Transitions of Care (Post Inpatient/ED Visit) (Unsigned)
   07/02/2022  Name: SPARROW FEDAK MRN: [id number] DOB: December 14, 1970  {[id number]}

## 2022-08-08 ENCOUNTER — Other Ambulatory Visit: Payer: Self-pay

## 2022-08-08 ENCOUNTER — Emergency Department (HOSPITAL_BASED_OUTPATIENT_CLINIC_OR_DEPARTMENT_OTHER)
Admission: EM | Admit: 2022-08-08 | Discharge: 2022-08-09 | Disposition: A | Discharge status: Home / Self Care | Payer: Commercial Managed Care - PPO | Attending: Emergency Medicine | Admitting: Emergency Medicine

## 2022-08-08 ENCOUNTER — Encounter (HOSPITAL_BASED_OUTPATIENT_CLINIC_OR_DEPARTMENT_OTHER): Payer: Self-pay | Admitting: Emergency Medicine

## 2022-08-08 ENCOUNTER — Emergency Department (HOSPITAL_BASED_OUTPATIENT_CLINIC_OR_DEPARTMENT_OTHER): Payer: Commercial Managed Care - PPO | Admitting: Radiology

## 2022-08-08 DIAGNOSIS — Z91040 Latex allergy status: Secondary | ICD-10-CM | POA: Diagnosis not present

## 2022-08-08 DIAGNOSIS — Z20822 Contact with and (suspected) exposure to covid-19: Secondary | ICD-10-CM | POA: Diagnosis not present

## 2022-08-08 DIAGNOSIS — R0602 Shortness of breath: Secondary | ICD-10-CM | POA: Insufficient documentation

## 2022-08-08 DIAGNOSIS — E876 Hypokalemia: Secondary | ICD-10-CM | POA: Insufficient documentation

## 2022-08-08 LAB — BASIC METABOLIC PANEL
Anion gap: 10 (ref 5–15)
BUN: 15 mg/dL (ref 6–20)
CO2: 29 mmol/L (ref 22–32)
Calcium: 9.1 mg/dL (ref 8.9–10.3)
Chloride: 100 mmol/L (ref 98–111)
Creatinine, Ser: 0.84 mg/dL (ref 0.44–1.00)
GFR, Estimated: >60 mL/min (ref >60)
Glucose, Bld: 93 mg/dL (ref 70–99)
Potassium: 3.1 mmol/L — ABNORMAL LOW (ref 3.5–5.1)
Sodium: 139 mmol/L (ref 135–145)

## 2022-08-08 LAB — TROPONIN I (HIGH SENSITIVITY)
Troponin I (High Sensitivity): 3 ng/L (ref <18)
Troponin I (High Sensitivity): 3 ng/L (ref <18)

## 2022-08-08 LAB — CBC
HCT: 36.1 % (ref 36.0–46.0)
Hemoglobin: 11.9 g/dL — ABNORMAL LOW (ref 12.0–15.0)
MCH: 27.9 pg (ref 26.0–34.0)
MCHC: 33 g/dL (ref 30.0–36.0)
MCV: 84.7 fL (ref 80.0–100.0)
Platelets: 396 10*3/uL (ref 150–400)
RBC: 4.26 MIL/uL (ref 3.87–5.11)
RDW: 14.6 % (ref 11.5–15.5)
WBC: 9.1 10*3/uL (ref 4.0–10.5)
nRBC: 0 % (ref 0.0–0.2)

## 2022-08-08 LAB — D-DIMER, QUANTITATIVE: D-Dimer, Quant: 0.31 ug/mL-FEU (ref 0.00–0.50)

## 2022-08-08 MED ORDER — AEROCHAMBER PLUS FLO-VU LARGE MISC
1.0000 | Freq: Once | Status: AC
  Administered 2022-08-08: 1
  Filled 2022-08-08: qty 1

## 2022-08-08 MED ORDER — ALBUTEROL SULFATE HFA 108 (90 BASE) MCG/ACT IN AERS
INHALATION_SPRAY | RESPIRATORY_TRACT | Status: AC
  Filled 2022-08-08: qty 6.7

## 2022-08-08 MED ORDER — ALBUTEROL SULFATE (2.5 MG/3ML) 0.083% IN NEBU
2.5000 mg | INHALATION_SOLUTION | Freq: Once | RESPIRATORY_TRACT | Status: AC
  Administered 2022-08-08: 2.5 mg via RESPIRATORY_TRACT
  Filled 2022-08-08: qty 3

## 2022-08-08 MED ORDER — IPRATROPIUM-ALBUTEROL 0.5-2.5 (3) MG/3ML IN SOLN
3.0000 mL | Freq: Once | RESPIRATORY_TRACT | Status: AC
  Administered 2022-08-08: 3 mL via RESPIRATORY_TRACT
  Filled 2022-08-08: qty 3

## 2022-08-08 NOTE — ED Provider Notes (Incomplete)
[facility] Provider Note   CSN: [id number] Arrival date & time: 08/08/22  1813     History {Add pertinent medical, surgical, social history, OB history to HPI:1} Chief Complaint  Patient presents with  . Shortness of Breath    Meagan Dean is a 52 y.o. female presented for shortness of breath that has been present for 1 week.  Patient states she went to the urgent care on 321 and given a prednisone taper albuterol and cough medication and amoxicillin to cover for possible URI.  Patient however states she is still continuing to have symptoms however does note that she has not used a spacer with her albuterol inhaler and only began using albuterol inhaler today as she was just given it today.  Patient states she is having a dry cough without fevers/chills, nausea/vomiting, abdominal pain.  Patient states she went to [place] last month which is a 9-hour drive but denied any leg swelling, hemoptysis, history of cancer, history of blood clots.  Patient states she thinks she has a history of asthma but is unsure.   Home Medications Prior to Admission medications   Medication Sig Start Date End Date Taking? Authorizing Provider  bacitracin ointment Apply 1 Application topically 2 (two) times daily. 02/09/22   Cardama, Grayce Sessions, MD  docusate sodium (COLACE) 100 MG capsule Take 100 mg by mouth daily.    [provider]  loratadine (CLARITIN) 10 MG tablet Take 10 mg by mouth daily.    [provider]  losartan (COZAAR) 25 MG tablet Take 1 tablet (25 mg total) by mouth daily. 05/16/22 05/16/23  Linward Natal, MD  naproxen sodium (ALEVE) 220 MG tablet Take 440 mg by mouth every 6 (six) hours as needed (pain).    [provider]  ondansetron (ZOFRAN-ODT) 4 MG disintegrating tablet 4mg  ODT q4 hours prn nausea/vomit Patient not taking: Reported on 12/25/2021 10/07/21   Orpah Greek, MD  polyethylene glycol (MIRALAX) 17  g packet Take 17 g by mouth daily. Start at half a cap full a day. If needed, add a half cap full each day until you are having daily bowel movements. Take no more than four cap fulls a day. 05/23/22   Linward Natal, MD  potassium chloride SA (KLOR-CON M20) 20 MEQ tablet Take 2 tablets (40 mEq total) by mouth daily. 05/16/22 08/14/22  Linward Natal, MD  Probiotic Product (PROBIOTIC PO) Take 1 capsule by mouth daily.    [provider]  sodium chloride (OCEAN) 0.65 % SOLN nasal spray Place 1 spray into both nostrils as needed for congestion.    [provider]      Allergies    Latex, Reglan [metoclopramide], and Vicodin [hydrocodone-acetaminophen]    Review of Systems   Review of Systems  Respiratory:  Positive for shortness of breath.   See HPI  Physical Exam Updated Vital Signs BP 105/82 (BP Location: Right Arm)   Pulse 76   Temp 98.7 F (37.1 C)   Resp 18   Ht 5\' 2"  (1.575 m)   Wt 72.1 kg   LMP  (LMP Unknown) Comment: 07/2016  SpO2 99%   BMI 29.08 kg/m  Physical Exam Vitals reviewed.  Constitutional:      General: She is not in acute distress. HENT:     Head: Normocephalic and atraumatic.  Eyes:     Extraocular Movements: Extraocular movements intact.     Conjunctiva/sclera: Conjunctivae normal.     Pupils:  Pupils are equal, round, and reactive to light.  Cardiovascular:     Rate and Rhythm: Normal rate and regular rhythm.     Pulses: Normal pulses.     Heart sounds: Normal heart sounds.     Comments: 2+ bilateral radial/dorsalis pedis pulses with regular rate Pulmonary:     Effort: Pulmonary effort is normal. No respiratory distress.     Comments: Decreased breath sounds bilaterally Soft wheezing in the left lung Abdominal:     Palpations: Abdomen is soft.     Tenderness: There is no abdominal tenderness. There is no guarding or rebound.  Musculoskeletal:        General: Normal range of motion.     Cervical back: Normal range of motion and neck  supple.     Right lower leg: No edema.     Left lower leg: No edema.     Comments: 5 out of 5 bilateral grip/leg extension strength  Skin:    General: Skin is warm and dry.     Capillary Refill: Capillary refill takes less than 2 seconds.  Neurological:     General: No focal deficit present.     Mental Status: She is alert and oriented to person, place, and time.     Comments: Sensation intact in all 4 limbs  Psychiatric:        Mood and Affect: Mood normal.     ED Results / Procedures / Treatments   Labs (all labs ordered are listed, but only abnormal results are displayed) Labs Reviewed  CBC - Abnormal; Notable for the following components:      Result Value   Hemoglobin 11.9 (*)    All other components within normal limits  BASIC METABOLIC PANEL - Abnormal; Notable for the following components:   Potassium 3.1 (*)    All other components within normal limits  TROPONIN I (HIGH SENSITIVITY)  TROPONIN I (HIGH SENSITIVITY)    EKG EKG Interpretation  Date/Time:  Thursday August 08 2022 18:26:15 EDT Ventricular Rate:  85 PR Interval:  154 QRS Duration: 70 QT Interval:  374 QTC Calculation: 445 R Axis:   54 Text Interpretation: Normal sinus rhythm Normal ECG When compared with ECG of 25-Dec-2021 07:08, No significant change was found No significant change since last tracing Confirmed by Gareth Morgan [phone]) on 08/08/2022 9:50:40 PM  Radiology DG Chest 2 View  Result Date: 08/08/2022 CLINICAL DATA:  Shortness of breath and cough EXAM: CHEST - 2 VIEW COMPARISON:  Chest x-ray 09/03/2021 FINDINGS: The heart size and mediastinal contours are within normal limits. Both lungs are clear. No consolidation, pneumothorax, effusion or edema. The visualized skeletal structures are unremarkable. Gastric band noted in the upper abdomen at the edge of the imaging field. IMPRESSION: No acute cardiopulmonary disease. Electronically Signed   By: Jill Side M.D.   On: 08/08/2022 19:08     Procedures Procedures  {Document cardiac monitor, telemetry assessment procedure when appropriate:1}  Medications Ordered in ED Medications  albuterol (VENTOLIN HFA) 108 (90 Base) MCG/ACT inhaler (  Given 08/08/22 1832)  AeroChamber Plus Flo-Vu Large MISC 1 each (1 each Other Given 08/08/22 1857)    ED Course/ Medical Decision Making/ A&P   {   Click here for ABCD2, HEART and other calculatorsREFRESH Note before signing :1}                          Medical Decision Making Amount and/or Complexity of Data Reviewed Labs:  ordered. Radiology: ordered.  Risk Prescription drug management.   ***  {Document critical care time when appropriate:1} {Document review of labs and clinical decision tools ie heart score, Chads2Vasc2 etc:1}  {Document your independent review of radiology images, and any outside records:1} {Document your discussion with family members, caretakers, and with consultants:1} {Document social determinants of health affecting pt's care:1} {Document your decision making why or why not admission, treatments were needed:1} Final Clinical Impression(s) / ED Diagnoses Final diagnoses:  None    Rx / DC Orders ED Discharge Orders     None

## 2022-08-08 NOTE — ED Notes (Signed)
Previous RT educated pt on proper use of MDI w/spacer. Pt verbalized understanding to RT.

## 2022-08-08 NOTE — ED Triage Notes (Signed)
Pt arrives to ED with c/o SOB. Her symptoms started on 3/21 but worsened today. [facility] started her on a prednisone taper, albuterol, cough medicine, for asthma exacerbation and amoxicillin for possible URI. Associated with dry cough.

## 2022-08-08 NOTE — ED Provider Notes (Signed)
[facility] Provider Note   CSN: [id number] Arrival date & time: 08/08/22  1813     History  Chief Complaint  Patient presents with   Shortness of Breath    Meagan Dean is a 52 y.o. female presented for shortness of breath that has been present for 1 week.  Patient states she went to the urgent care on 321 and given a prednisone taper albuterol and cough medication and amoxicillin to cover for possible URI.  Patient however states she is still continuing to have symptoms however does note that she has not used a spacer with her albuterol inhaler and only began using albuterol inhaler today as she was just given it today.  Patient states she is having a dry cough without fevers/chills, nausea/vomiting, abdominal pain.  Patient states she went to [place] last month which is a 9-hour drive but denied any leg swelling, hemoptysis, history of cancer, history of blood clots.  Patient states she thinks she has a history of asthma but is unsure.   Home Medications Prior to Admission medications   Medication Sig Start Date End Date Taking? Authorizing Provider  bacitracin ointment Apply 1 Application topically 2 (two) times daily. 02/09/22   Cardama, Grayce Sessions, MD  docusate sodium (COLACE) 100 MG capsule Take 100 mg by mouth daily.    [provider]  ipratropium-albuterol (DUONEB) 0.5-2.5 (3) MG/3ML SOLN Take 3 mLs by nebulization every 6 (six) hours as needed. 08/09/22   Chuck Hint, PA-C  loratadine (CLARITIN) 10 MG tablet Take 10 mg by mouth daily.    [provider]  losartan (COZAAR) 25 MG tablet Take 1 tablet (25 mg total) by mouth daily. 05/16/22 05/16/23  Linward Natal, MD  naproxen sodium (ALEVE) 220 MG tablet Take 440 mg by mouth every 6 (six) hours as needed (pain).    [provider]  ondansetron (ZOFRAN-ODT) 4 MG disintegrating tablet 4mg  ODT q4 hours prn nausea/vomit Patient not taking: Reported on  12/25/2021 10/07/21   Orpah Greek, MD  polyethylene glycol (MIRALAX) 17 g packet Take 17 g by mouth daily. Start at half a cap full a day. If needed, add a half cap full each day until you are having daily bowel movements. Take no more than four cap fulls a day. 05/23/22   Linward Natal, MD  potassium chloride SA (KLOR-CON M20) 20 MEQ tablet Take 2 tablets (40 mEq total) by mouth daily. 05/16/22 08/14/22  Linward Natal, MD  Probiotic Product (PROBIOTIC PO) Take 1 capsule by mouth daily.    [provider]  sodium chloride (OCEAN) 0.65 % SOLN nasal spray Place 1 spray into both nostrils as needed for congestion.    [provider]      Allergies    Latex, Reglan [metoclopramide], and Vicodin [hydrocodone-acetaminophen]    Review of Systems   Review of Systems  Respiratory:  Positive for shortness of breath.   See HPI  Physical Exam Updated Vital Signs BP (!) 140/98   Pulse 85   Temp 98.7 F (37.1 C)   Resp 15   Ht 5\' 2"  (1.575 m)   Wt 72.1 kg   LMP  (LMP Unknown) Comment: 07/2016  SpO2 100%   BMI 29.08 kg/m  Physical Exam Vitals reviewed.  Constitutional:      General: She is not in acute distress. HENT:     Head: Normocephalic and atraumatic.  Eyes:     Extraocular Movements: Extraocular movements  intact.     Conjunctiva/sclera: Conjunctivae normal.     Pupils: Pupils are equal, round, and reactive to light.  Cardiovascular:     Rate and Rhythm: Normal rate and regular rhythm.     Pulses: Normal pulses.     Heart sounds: Normal heart sounds.     Comments: 2+ bilateral radial/dorsalis pedis pulses with regular rate Pulmonary:     Effort: Pulmonary effort is normal. No respiratory distress.     Comments: Decreased breath sounds bilaterally Soft wheezing in the left lung Abdominal:     Palpations: Abdomen is soft.     Tenderness: There is no abdominal tenderness. There is no guarding or rebound.  Musculoskeletal:        General: Normal range of  motion.     Cervical back: Normal range of motion and neck supple.     Right lower leg: No edema.     Left lower leg: No edema.     Comments: 5 out of 5 bilateral grip/leg extension strength  Skin:    General: Skin is warm and dry.     Capillary Refill: Capillary refill takes less than 2 seconds.  Neurological:     General: No focal deficit present.     Mental Status: She is alert and oriented to person, place, and time.     Comments: Sensation intact in all 4 limbs  Psychiatric:        Mood and Affect: Mood normal.     ED Results / Procedures / Treatments   Labs (all labs ordered are listed, but only abnormal results are displayed) Labs Reviewed  CBC - Abnormal; Notable for the following components:      Result Value   Hemoglobin 11.9 (*)    All other components within normal limits  BASIC METABOLIC PANEL - Abnormal; Notable for the following components:   Potassium 3.1 (*)    All other components within normal limits  RESP PANEL BY RT-PCR (RSV, FLU A&B, COVID)  [id number]  D-DIMER, QUANTITATIVE  TROPONIN I (HIGH SENSITIVITY)  TROPONIN I (HIGH SENSITIVITY)    EKG EKG Interpretation  Date/Time:  Thursday August 08 2022 18:26:15 EDT Ventricular Rate:  85 PR Interval:  154 QRS Duration: 70 QT Interval:  374 QTC Calculation: 445 R Axis:   54 Text Interpretation: Normal sinus rhythm Normal ECG When compared with ECG of 25-Dec-2021 07:08, No significant change was found No significant change since last tracing Confirmed by Gareth Morgan [id number] on 08/08/2022 9:50:40 PM  Radiology DG Chest 2 View  Result Date: 08/08/2022 CLINICAL DATA:  Shortness of breath and cough EXAM: CHEST - 2 VIEW COMPARISON:  Chest x-ray 09/03/2021 FINDINGS: The heart size and mediastinal contours are within normal limits. Both lungs are clear. No consolidation, pneumothorax, effusion or edema. The visualized skeletal structures are unremarkable. Gastric band noted in the upper abdomen at the edge of  the imaging field. IMPRESSION: No acute cardiopulmonary disease. Electronically Signed   By: Jill Side M.D.   On: 08/08/2022 19:08    Procedures Procedures    Medications Ordered in ED Medications  albuterol (VENTOLIN HFA) 108 (90 Base) MCG/ACT inhaler (  Given 08/08/22 1832)  AeroChamber Plus Flo-Vu Large MISC 1 each (1 each Other Given 08/08/22 1857)  ipratropium-albuterol (DUONEB) 0.5-2.5 (3) MG/3ML nebulizer solution 3 mL (3 mLs Nebulization Given 08/08/22 2327)  albuterol (PROVENTIL) (2.5 MG/3ML) 0.083% nebulizer solution 2.5 mg (2.5 mg Nebulization Given 08/08/22 2327)  potassium chloride SA (KLOR-CON M) CR tablet 40  mEq (40 mEq Oral Given 08/09/22 0027)    ED Course/ Medical Decision Making/ A&P                             Medical Decision Making Amount and/or Complexity of Data Reviewed Labs: ordered. Radiology: ordered.  Risk Prescription drug management.   [name] 52 y.o. presented today for shortness of breath. Working DDx that I considered at this time includes, but not limited to, ACS, PE, reactive airway disease, asthma/COPD exacerbation, anemia, electrolyte abnormalities.  R/o DDx: Anemia, asthma/COPD exacerbation, PE, ACS: These are considered less likely due to history of present illness and physical exam findings  Review of prior external notes: 06/26/22 ED  Unique Tests and My Interpretation:  D-dimer: Unremarkable BMP: Hypokalemic 3.1 Respiratory panel: Negative Troponin: 3 CBC: Unremarkable CXR: no acute cardiopulmonary changes EKG: normal sinus rhythm and rate. No ST abnormalities or blocks noted  Discussion with Independent Historian: none  Discussion of Management of Tests: none  Risk: Cannot be determined at this time  Risk Stratification Score: [place]: 1 recent travel  Plan: Patient presented for shortness of breath. On exam patient was in no acute distress however did have decreased breath sounds and soft wheezing in the left lungs  after receiving a breathing treatment from the RT.  Due to patient having recent travel in the past 3 months a D-dimer was ordered which came back negative and so CT scan would not be ordered at this time.  Patient potassium was 3.1 and so patient will be given potassium orally 40 milliequivalents.  I suspect patient is most likely hypokalemic from albuterol use.  After speak with RT we both feel comfortable discharging the patient as her breathing has dramatically improved and we do not hear wheezing.  Patient does have soft air movement on auscultation.  Patient will be prescribed DuoNeb treatment along with ambulatory referral to pulmonology and encouraged to follow-up with her primary care provider.  I spoke with the patient about using the spacer with her inhaler for better effect.  Patient was given return precautions. Patient stable for discharge at this time.  Patient verbalized understanding of plan.         Final Clinical Impression(s) / ED Diagnoses Final diagnoses:  Shortness of breath  Hypokalemia    Rx / DC Orders ED Discharge Orders          Ordered    ipratropium-albuterol (DUONEB) 0.5-2.5 (3) MG/3ML SOLN  Every 6 hours PRN,   Status:  Discontinued        08/09/22 0029    ipratropium-albuterol (DUONEB) 0.5-2.5 (3) MG/3ML SOLN  Every 6 hours PRN        08/09/22 0029    Ambulatory referral to Pulmonology        08/09/22 0035              Chuck Hint, PA-C 08/09/22 0038    Gareth Morgan, MD 08/09/22 1246

## 2022-08-09 LAB — RESP PANEL BY RT-PCR (RSV, FLU A&B, COVID)  RVPGX2
Influenza A by PCR: NEGATIVE
Influenza B by PCR: NEGATIVE
Resp Syncytial Virus by PCR: NEGATIVE
SARS Coronavirus 2 by RT PCR: NEGATIVE

## 2022-08-09 MED ORDER — IPRATROPIUM-ALBUTEROL 0.5-2.5 (3) MG/3ML IN SOLN: 3.0000 mL | Freq: Four times a day (QID) | RESPIRATORY_TRACT | 0 refills | Status: AC | PRN

## 2022-08-09 MED ORDER — POTASSIUM CHLORIDE CRYS ER 20 MEQ PO TBCR
40.0000 meq | EXTENDED_RELEASE_TABLET | Freq: Once | ORAL | Status: AC
  Administered 2022-08-09: 40 meq via ORAL
  Filled 2022-08-09: qty 2

## 2022-08-09 MED ORDER — IPRATROPIUM-ALBUTEROL 0.5-2.5 (3) MG/3ML IN SOLN: 3.0000 mL | Freq: Four times a day (QID) | RESPIRATORY_TRACT | 0 refills | Status: DC | PRN

## 2022-08-09 NOTE — ED Notes (Signed)
Reviewed AVS/discharge instruction with patient. Time allotted for and all questions answered. Patient is agreeable for d/c and escorted to ed exit by staff.  

## 2022-08-09 NOTE — Discharge Instructions (Addendum)
Please pick up the DuoNeb I have prescribed for you and use as directed every 6 hours.  Please make an appointment with your primary care provider regarding recent symptoms.  You also be given a furl to a pulmonologist or lung doctor for further evaluation of your symptoms.  Please continue to use the spacer with your inhaler. If symptoms worsen please return to ER.

## 2022-08-29 ENCOUNTER — Ambulatory Visit (INDEPENDENT_AMBULATORY_CARE_PROVIDER_SITE_OTHER): Payer: Commercial Managed Care - PPO | Admitting: Pulmonary Disease

## 2022-08-29 ENCOUNTER — Encounter (HOSPITAL_BASED_OUTPATIENT_CLINIC_OR_DEPARTMENT_OTHER): Payer: Self-pay | Admitting: Pulmonary Disease

## 2022-08-29 VITALS — BP 136/90 | HR 103 | Ht 63.0 in | Wt 161.0 lb

## 2022-08-29 DIAGNOSIS — J4530 Mild persistent asthma, uncomplicated: Secondary | ICD-10-CM | POA: Diagnosis not present

## 2022-08-29 NOTE — Patient Instructions (Addendum)
Asthma (no PFTs) - currently not in exacerbation --ORDER pulmonary function test --CONTINUE Albuterol AS NEEDED for shortness of breath or wheezing --Use nebulizer as needed for severe symptoms

## 2022-08-29 NOTE — Progress Notes (Signed)
Subjective:   PATIENT ID: Meagan Dean GENDER: female DOB: 08/30/1970, MRN: [id number]  Chief Complaint  Patient presents with   Consult    Dx: asthma    Reason for Visit: New consult for asthma  [name] is a 52 year old female with never smoker asthma, HTN who presents for asthma evaluation, ED follow-up.  She has recently been to urgent care in [facility] on 3/21 and ED on 3/28 for persistent respiratory symptoms including cough. Referred to Pulmonary. She was diagnosed with asthma in 2001. Never had PFTs. She has not used maintenance inhalers in the past but has as needed albuterol usually when she is ill. She has on average two URIs a year requiring treatment. She reports wheezing, chest tightness, nonproductive cough that has now resolved. Has not needed rescue inhaler in the last week. Previously needed it 2-3 times a week with night time awakenings. No longer having awakenings. No limitations in activity   Asthma Control Test ACT Total Score  08/29/2022  8:40 AM 8    2023 Jan Feb Mar April May June July Aug Sept Oct Nov Dec                2024 Jan Feb Mar April May June July Aug Sept Oct Nov Dec     X           2025 Jan Feb Mar April May June July Aug Sept Oct Nov Dec                 Social History: Never smoker  I have personally reviewed patient's past medical/family/social history, allergies, current medications.  Past Medical History:  Diagnosis Date   Anemia    ASCUS with positive high risk HPV cervical 03/15/2017   Asthma due to seasonal allergies    MILD-- JUST GOT INHALER-not used yet -allergy induced   Calculus of ureter 06/16/2013   Endometriosis of pelvis    Fibroids 09/02/2017   GERD (gastroesophageal reflux disease)    diet controlled   H/O hiatal hernia    Headache    MIGRAINES - none since weight loss surgery   History of endometriosis 03/28/2017   History of hypertension    NO ISSUES SINCE WT LOSS AFTER GASTRIC BANDING   History of  kidney stones    surgery to remove   History of obstructive sleep apnea    DX'D PRIOR TO GASTRIC BANDING  2010 --  NO ISSUES SINCE WT LOSS   Hypertension 03/28/2017   Low potassium syndrome    hopsitalized for 3 days   Right ureteral stone    Seasonal allergies    Sinus headache    Sleep apnea    prior to weight loss surgery, does not have cpap   Urgency of urination      Family History  Problem Relation Age of Onset   Stroke Father    Breast cancer Sister        75 or 23   Colon cancer Paternal Aunt    Colon cancer Paternal Uncle    Prostate cancer Paternal Uncle    Prostate cancer Paternal Uncle    Prostate cancer Paternal Uncle    Cancer Maternal Grandmother        colon/liver     Social History   Occupational History   Not on file  Tobacco Use   Smoking status: Never    Passive exposure: Current   Smokeless tobacco: Never  Vaping Use  Vaping Use: Never used  Substance and Sexual Activity   Alcohol use: Yes    Alcohol/week: 0.0 standard drinks of alcohol    Comment: occasionally   Drug use: No   Sexual activity: Not Currently    Birth control/protection: None    Allergies  Allergen Reactions   Latex Itching    Powder in latex gloves   Reglan [Metoclopramide] Other (See Comments)    Muscle twitching/lip twitch   Vicodin [Hydrocodone-Acetaminophen] Other (See Comments)    Made her feel "high"  Has had dilaudid before and tolerated it well     Outpatient Medications Prior to Visit  Medication Sig Dispense Refill   albuterol (VENTOLIN HFA) 108 (90 Base) MCG/ACT inhaler Inhale 2 puffs into the lungs.     ipratropium-albuterol (DUONEB) 0.5-2.5 (3) MG/3ML SOLN Take 3 mLs by nebulization every 6 (six) hours as needed. 360 mL 0   loratadine (CLARITIN) 10 MG tablet Take 10 mg by mouth daily.     losartan (COZAAR) 25 MG tablet Take 1 tablet (25 mg total) by mouth daily. 30 tablet 11   potassium chloride SA (KLOR-CON M20) 20 MEQ tablet Take 2 tablets (40 mEq  total) by mouth daily. 180 tablet 0   Probiotic Product (PROBIOTIC PO) Take 1 capsule by mouth daily.     bacitracin ointment Apply 1 Application topically 2 (two) times daily. (Patient not taking: Reported on 08/29/2022) 425 g 0   docusate sodium (COLACE) 100 MG capsule Take 100 mg by mouth daily. (Patient not taking: Reported on 08/29/2022)     naproxen sodium (ALEVE) 220 MG tablet Take 440 mg by mouth every 6 (six) hours as needed (pain). (Patient not taking: Reported on 08/29/2022)     ondansetron (ZOFRAN-ODT) 4 MG disintegrating tablet  ODT q4 hours prn nausea/vomit (Patient not taking: Reported on 12/25/2021) 10 tablet 0   polyethylene glycol (MIRALAX) 17 g packet Take 17 g by mouth daily. Start at half a cap full a day. If needed, add a half cap full each day until you are having daily bowel movements. Take no more than four cap fulls a day. (Patient not taking: Reported on 08/29/2022) 14 each 0   sodium chloride (OCEAN) 0.65 % SOLN nasal spray Place 1 spray into both nostrils as needed for congestion. (Patient not taking: Reported on 08/29/2022)     No facility-administered medications prior to visit.    Review of Systems  Constitutional:  Negative for chills, diaphoresis, fever, malaise/fatigue and weight loss.  HENT:  Negative for congestion.   Respiratory:  Negative for cough, hemoptysis, sputum production, shortness of breath and wheezing.   Cardiovascular:  Negative for chest pain, palpitations and leg swelling.     Objective:   Vitals:   08/29/22 0839  BP: (!) 136/90  Pulse: (!) 103  SpO2: 98%  Weight: 161 lb (73 kg)  Height:  (1.6 m)   SpO2: 98 % O2 Device: None (Room air)  Physical Exam: General: Well-appearing, no acute distress HENT: [place], AT Eyes: EOMI, no scleral icterus Respiratory: Clear to auscultation bilaterally.  No crackles, wheezing or rales Cardiovascular: RRR, -M/R/G, no JVD Extremities:-Edema,-tenderness Neuro: AAO x4, CNII-XII grossly  intact Psych: Normal mood, normal affect  Data Reviewed:  Imaging: CXR 08/08/22 - No infiltrate effusion or edema  PFT: None on file  Labs: CBC    Component Value Date/Time   WBC 9.1 08/08/2022 1827   RBC 4.26 08/08/2022 1827   HGB 11.9 (L) 08/08/2022 1827   HGB  12.2 08/12/2019 1538   HCT 36.1 08/08/2022 1827   HCT 35.4 08/12/2019 1538   PLT 396 08/08/2022 1827   PLT 412 08/12/2019 1538   MCV 84.7 08/08/2022 1827   MCV 86 08/12/2019 1538   MCH 27.9 08/08/2022 1827   MCHC 33.0 08/08/2022 1827   RDW 14.6 08/08/2022 1827   RDW 13.3 08/12/2019 1538   LYMPHSABS 2.5 05/15/2022 0850   LYMPHSABS 2.2 11/02/2018 1009   MONOABS 0.4 05/15/2022 0850   EOSABS 0.1 05/15/2022 0850   EOSABS 0.1 11/02/2018 1009   BASOSABS 0.0 05/15/2022 0850   BASOSABS 0.1 11/02/2018 1009   Absolute eos 08/08/22 - 100     Assessment & Plan:   Discussion: 52 year old female with never smoker asthma, HTN who presents for asthma evaluation, ED follow-up. Discussed clinical course and management of asthma including bronchodilator regimen, preventive care including vaccinations and action plan for exacerbation.   Asthma (no PFTs) - currently not in exacerbation --ORDER pulmonary function test --CONTINUE Albuterol AS NEEDED for shortness of breath or wheezing --Use nebulizer as needed for severe symptoms  Health Maintenance Immunization History  Administered Date(s) Administered   PPD Test 01/29/2016, 01/19/2019, 10/25/2020, 10/22/2021   Pneumococcal Polysaccharide-23 06/17/2013   Tdap 02/09/2022   CT Lung Screen not qualified  Orders Placed This Encounter  Procedures   Pulmonary function test    Standing Status:   Future    Standing Expiration Date:   08/29/2023    Order Specific Question:   Where should this test be performed?    Answer:   [facility]    Order Specific Question:   Full PFT: includes the following: basic spirometry, spirometry pre & post bronchodilator, diffusion  capacity (DLCO), lung volumes    Answer:   Full PFT  No orders of the defined types were placed in this encounter.   Return for after PFT, ok for in-person or video to discuss results.  I have spent a total time of 45-minutes on the day of the appointment reviewing prior documentation, coordinating care and discussing medical diagnosis and plan with the patient/family. Imaging, labs and tests included in this note have been reviewed and interpreted independently by me.  Caiden Arteaga Mechele Collin, MD [facility] 08/29/2022 9:02 AM  Office Number [phone]

## 2022-08-30 ENCOUNTER — Encounter (HOSPITAL_BASED_OUTPATIENT_CLINIC_OR_DEPARTMENT_OTHER): Payer: Self-pay | Admitting: Pulmonary Disease

## 2022-08-30 DIAGNOSIS — J4530 Mild persistent asthma, uncomplicated: Secondary | ICD-10-CM | POA: Insufficient documentation

## 2022-09-24 ENCOUNTER — Encounter (HOSPITAL_BASED_OUTPATIENT_CLINIC_OR_DEPARTMENT_OTHER): Payer: Self-pay

## 2022-09-26 ENCOUNTER — Telehealth: Payer: Self-pay | Admitting: *Deleted

## 2022-09-26 NOTE — Progress Notes (Deleted)
CC: cramping  HPI:  [name] is a 52 y.o. female with past medical history of HTN and asthma that presents for cramping.    Allergies as of 09/26/2022       Reactions   Latex Itching   Powder in latex gloves   Reglan [metoclopramide] Other (See Comments)   Muscle twitching/lip twitch   Vicodin [hydrocodone-acetaminophen] Other (See Comments)   Made her feel "high" Has had dilaudid before and tolerated it well        Medication List        Accurate as of Sep 26, 2022 10:04 AM. If you have any questions, ask your nurse or doctor.          albuterol 108 (90 Base) MCG/ACT inhaler Commonly known as: VENTOLIN HFA Inhale 2 puffs into the lungs.   ipratropium-albuterol 0.5-2.5 (3) MG/3ML Soln Commonly known as: DUONEB Take 3 mLs by nebulization every 6 (six) hours as needed.   loratadine 10 MG tablet Commonly known as: CLARITIN Take 10 mg by mouth daily.   losartan 25 MG tablet Commonly known as: Cozaar Take 1 tablet (25 mg total) by mouth daily.   potassium chloride SA 20 MEQ tablet Commonly known as: Klor-Con M20 Take 2 tablets (40 mEq total) by mouth daily.   PROBIOTIC PO Take 1 capsule by mouth daily.         Past Medical History:  Diagnosis Date   Anemia    ASCUS with positive high risk HPV cervical 03/15/2017   Asthma due to seasonal allergies    MILD-- JUST GOT INHALER-not used yet -allergy induced   Calculus of ureter 06/16/2013   Endometriosis of pelvis    Fibroids 09/02/2017   GERD (gastroesophageal reflux disease)    diet controlled   H/O hiatal hernia    Headache    MIGRAINES - none since weight loss surgery   History of endometriosis 03/28/2017   History of hypertension    NO ISSUES SINCE WT LOSS AFTER GASTRIC BANDING   History of kidney stones    surgery to remove   History of obstructive sleep apnea    DX'D PRIOR TO GASTRIC BANDING  2010 --  NO ISSUES SINCE WT LOSS   Hypertension 03/28/2017   Low potassium syndrome     hopsitalized for 3 days   Right ureteral stone    Seasonal allergies    Sinus headache    Sleep apnea    prior to weight loss surgery, does not have cpap   Urgency of urination    Review of Systems:  per HPI.   Physical Exam: *** There were no vitals filed for this visit. *** Constitutional: Well-developed, well-nourished, appears comfortable  HENT: Normocephalic and atraumatic.  Eyes: EOM are normal. PERRL.  Neck: Normal range of motion.  Cardiovascular: Regular rate, regular rhythm. No murmurs, rubs, or gallops. Normal radial and PT pulses bilaterally. No LE edema.  Pulmonary: Normal respiratory effort. No wheezes, rales, or rhonchi.   Abdominal: Soft. Non-distended. No tenderness. Normal bowel sounds.  Musculoskeletal: Normal range of motion.     Neurological: Alert and oriented to person, place, and time. Non-focal. Skin: warm and dry.    Assessment & Plan:   Cramping Patient developed *** pain *** ago that began while ***. Patient states that the pain does *** radiate and is *** in severity. Patient states that the pain worsens w/ *** and improves w/ ***. Patient denies recent injury or increase in activity***. Patient states that  they have tried *** without relief in their symptoms.   Patient does have a history of hypokalemia. Was previously seen for this in clinic in 05/2022. Prior workup was negative for hyperaldosteronism.  Did not suspect tubular acidosis given absence of acidosis. Has history of gastric banding and frequent SBOs but denied any changes in her bowel movements that might suggest GI losses. Not on a diuretic. Losartan was added for BP control and with hopes that this would also improve hypokalemia. She is also prescribed potassium chloride 40 mEq daily and reports compliance with this medication***. Currently using albuterol inhaler and ipratropium-albuterol nebulizer for her asthma.   Plan: - Continue potassium chloride 40 mEq daily*** - BMP today***  2.  HTN Patient has a history of HTN. Current medications include losartan 25 MG daily. Patient states that they are *** compliant with this medication. Patient states that they do *** check their BP regularly at home. Patient denies HA, lightheadedness, dizziness, CP, or SOB. Initial BP today is ***. Repeat BP is ***.    Plan: - Continue losartan 25 MG daily***  3.  - Current medications include  Plan: - Continue   4. Health Screening: ***Colonoscopy -  - Medication refill? ***   See Encounters Tab for problem based charting.  No problem-specific Assessment & Plan notes found for this encounter.    Patient seen with Dr. Amadeo Garnet

## 2022-09-26 NOTE — Telephone Encounter (Signed)
Call from pt stating her K+ level maybe low. States she had severe cramping and some discomfort in  her arm and chest. Denies having any pain at this time; she's currently on her way to see a client. States she has been taking her medication. Appt given this am with Dr Peterson Lombard @ 1045 AM.

## 2022-09-27 ENCOUNTER — Encounter (HOSPITAL_BASED_OUTPATIENT_CLINIC_OR_DEPARTMENT_OTHER): Payer: Commercial Managed Care - PPO

## 2022-09-27 ENCOUNTER — Ambulatory Visit (HOSPITAL_BASED_OUTPATIENT_CLINIC_OR_DEPARTMENT_OTHER): Payer: Commercial Managed Care - PPO | Admitting: Pulmonary Disease

## 2022-09-30 ENCOUNTER — Encounter: Payer: Commercial Managed Care - PPO | Admitting: Student

## 2022-10-02 ENCOUNTER — Ambulatory Visit (INDEPENDENT_AMBULATORY_CARE_PROVIDER_SITE_OTHER): Payer: Commercial Managed Care - PPO | Admitting: Student

## 2022-10-02 ENCOUNTER — Ambulatory Visit (HOSPITAL_COMMUNITY)
Admission: RE | Admit: 2022-10-02 | Discharge: 2022-10-02 | Disposition: A | Discharge status: Home / Self Care | Payer: Commercial Managed Care - PPO | Source: Ambulatory Visit | Attending: Internal Medicine | Admitting: Internal Medicine

## 2022-10-02 ENCOUNTER — Encounter: Payer: Self-pay | Admitting: Student

## 2022-10-02 VITALS — BP 132/83 | HR 87 | Temp 99.0°F | Ht 63.0 in | Wt 162.7 lb

## 2022-10-02 DIAGNOSIS — R300 Dysuria: Secondary | ICD-10-CM | POA: Insufficient documentation

## 2022-10-02 DIAGNOSIS — I10 Essential (primary) hypertension: Secondary | ICD-10-CM | POA: Diagnosis not present

## 2022-10-02 DIAGNOSIS — E782 Mixed hyperlipidemia: Secondary | ICD-10-CM | POA: Diagnosis not present

## 2022-10-02 DIAGNOSIS — R0789 Other chest pain: Secondary | ICD-10-CM | POA: Diagnosis present

## 2022-10-02 DIAGNOSIS — E876 Hypokalemia: Secondary | ICD-10-CM

## 2022-10-02 NOTE — Assessment & Plan Note (Signed)
Blood pressure well-controlled 132/83.  Patient is taking losartan 50 mg daily.  -Can consider adding spironolactone if potassium remains low

## 2022-10-02 NOTE — Assessment & Plan Note (Addendum)
This is a chronic issue.  She has has multiple workups that were negative including Abdo/renin.  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  No significant GI losses reported.  Not on diuretics.  Her transtubular potassium gradient obtained during her admission in August showed renal wasting potassium without obvious underlying causes.  Patient report adherence to losartan 50 mg and KCl 40 mEq daily.  -Recheck BMP today -Can consider restarting spironolactone if patient agrees.  It seems that she has bad reaction with spironolactone in the past which was discontinued twice.  She does not remember the side effects but is willing to try again

## 2022-10-02 NOTE — Progress Notes (Signed)
CC: Cramping and chest pain  HPI:  [name] is a 52 y.o. living with hypertension, asthma, chronic hypokalemia, who presented to the clinic for evaluation of extremity cramping and chest pain.  Please see problem based charting for detail  Past Medical History:  Diagnosis Date   Anemia    ASCUS with positive high risk HPV cervical 03/15/2017   Asthma due to seasonal allergies    MILD-- JUST GOT INHALER-not used yet -allergy induced   Calculus of ureter 06/16/2013   Endometriosis of pelvis    Fibroids 09/02/2017   GERD (gastroesophageal reflux disease)    diet controlled   H/O hiatal hernia    Headache    MIGRAINES - none since weight loss surgery   History of endometriosis 03/28/2017   History of hypertension    NO ISSUES SINCE WT LOSS AFTER GASTRIC BANDING   History of kidney stones    surgery to remove   History of obstructive sleep apnea    DX'D PRIOR TO GASTRIC BANDING  2010 --  NO ISSUES SINCE WT LOSS   Hypertension 03/28/2017   Low potassium syndrome    hopsitalized for 3 days   Right ureteral stone    Seasonal allergies    Sinus headache    Sleep apnea    prior to weight loss surgery, does not have cpap   Urgency of urination    Review of Systems:  per HPI  Physical Exam:  Vitals:   10/02/22 0850  BP: 132/83  Pulse: 87  Temp: 99 F (37.2 C)  TempSrc: Oral  SpO2: 97%  Weight: 162 lb 11.2 oz (73.8 kg)  Height: 5\' 3"  (1.6 m)   Physical Exam Constitutional:      General: She is not in acute distress.    Appearance: She is not ill-appearing.  HENT:     Head: Normocephalic.  Eyes:     General:        Right eye: No discharge.        Left eye: No discharge.     Conjunctiva/sclera: Conjunctivae normal.  Cardiovascular:     Rate and Rhythm: Normal rate and regular rhythm.     Heart sounds: No murmur heard. Pulmonary:     Effort: Pulmonary effort is normal. No respiratory distress.     Breath sounds: Normal breath sounds. No wheezing.   Abdominal:     General: Bowel sounds are normal. There is no distension.     Palpations: Abdomen is soft.  Musculoskeletal:     Cervical back: Normal range of motion.     Right lower leg: No edema.     Left lower leg: No edema.  Skin:    General: Skin is warm.  Neurological:     Mental Status: She is alert. Mental status is at baseline.  Psychiatric:        Mood and Affect: Mood normal.      Assessment & Plan:   See Encounters Tab for problem based charting.  Hypertension Blood pressure well-controlled 132/83.  Patient is taking losartan 50 mg daily.  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  She has has multiple workups that were negative including Abdo/renin.  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  No significant GI losses reported.  Not on diuretics.  Her transtubular potassium gradient obtained during her admission in August showed renal wasting potassium without obvious underlying causes.  Patient  report adherence to losartan 50 mg and KCl 40 mEq daily.  -Recheck BMP today -Can consider restarting spironolactone if patient agrees.  It seems that she has bad reaction with spironolactone in the past which was discontinued twice.  She does not remember the side effects but is willing to try again  Atypical chest pain Patient reports 2 episode of chest pain 1 week ago.  Pain was located in her left chest and radiates to her left arm that only lasted a few seconds.  She denies associated shortness of breath or diaphoresis.  She is unsure about palpitation.  Pain does not radiate anywhere else.  She was resting when it happens.  She had the sensation on 2 consecutive days.  She states that 2 days after, she experienced the same symptoms with exertion.  She also had a similar sensation during our interview today that only lasted a few seconds.  Physical exam was unremarkable.  Her  heart was regular rate and rhythm on auscultation.  No signs of respiratory distress.  No reproducible pain with chest wall palpation.  Low suspicion for ACS.  Her chest pain could be secondary to CAD versus dysrhythmia.  EKG obtained today and was reassuring.  No ischemic changes or arrhythmia noted.  -Referral to cardiology for possible stress test -Advised patient to take baby aspirin daily -Recheck lipid panel.  Her last LDL 1 year ago was 166  Dysuria Reports dysuria in the last few day without urinary frequency.  She is unsure of hematuria.  She it feels like a UTI for her.  She does not have frequent UTI.  -Obtain UA   Patient discussed with Dr. Mayford Knife

## 2022-10-02 NOTE — Assessment & Plan Note (Signed)
Reports dysuria in the last few day without urinary frequency.  She is unsure of hematuria.  She it feels like a UTI for her.  She does not have frequent UTI.  -Obtain UA

## 2022-10-02 NOTE — Patient Instructions (Signed)
Meagan Dean,  It was nice seeing you in the clinic today.  Here is a summary what we talked about:  1.  I will recheck your potassium level today.  If is still low, I might add another medication called spironolactone to help with your blood pressure and potassium level.  This medication can help reduce the amount of potassium pills you have to take daily.  2.  For your chest pain, I placed a referral to a cardiologist for possible stress tests.  Please take a aspirin 81 mg daily.  I will recheck your cholesterol today.  Please return in 3 months sooner if needed  Dr. Cyndie Chime

## 2022-10-02 NOTE — Assessment & Plan Note (Addendum)
Patient reports 2 episode of chest pain 1 week ago.  Pain was located in her left chest and radiates to her left arm that only lasted a few seconds.  She denies associated shortness of breath or diaphoresis.  She is unsure about palpitation.  Pain does not radiate anywhere else.  She was resting when it happens.  She had the sensation on 2 consecutive days.  She states that 2 days after, she experienced the same symptoms with exertion.  She also had a similar sensation during our interview today that only lasted a few seconds.  Physical exam was unremarkable.  Her heart was regular rate and rhythm on auscultation.  No signs of respiratory distress.  No reproducible pain with chest wall palpation.  Low suspicion for ACS.  Her chest pain could be secondary to CAD versus dysrhythmia.  EKG obtained today and was reassuring.  No ischemic changes or arrhythmia noted.  -Referral to cardiology for possible stress test -Advised patient to take baby aspirin daily -Recheck lipid panel.  Her last LDL 1 year ago was 166

## 2022-10-03 LAB — LIPID PANEL
Chol/HDL Ratio: 3.2 ratio (ref 0.0–4.4)
Cholesterol, Total: 200 mg/dL — ABNORMAL HIGH (ref 100–199)
HDL: 62 mg/dL (ref >39)
LDL Chol Calc (NIH): 126 mg/dL — ABNORMAL HIGH (ref 0–99)
Triglycerides: 67 mg/dL (ref 0–149)
VLDL Cholesterol Cal: 12 mg/dL (ref 5–40)

## 2022-10-03 LAB — BMP8+ANION GAP
Anion Gap: 16 mmol/L (ref 10.0–18.0)
BUN/Creatinine Ratio: 8 — ABNORMAL LOW (ref 9–23)
BUN: 7 mg/dL (ref 6–24)
CO2: 23 mmol/L (ref 20–29)
Calcium: 9.1 mg/dL (ref 8.7–10.2)
Chloride: 101 mmol/L (ref 96–106)
Creatinine, Ser: 0.84 mg/dL (ref 0.57–1.00)
Glucose: 81 mg/dL (ref 70–99)
Potassium: 3.2 mmol/L — ABNORMAL LOW (ref 3.5–5.2)
Sodium: 140 mmol/L (ref 134–144)
eGFR: 84 mL/min/{1.73_m2} (ref >59)

## 2022-10-04 MED ORDER — SPIRONOLACTONE 25 MG PO TABS
25.0000 mg | ORAL_TABLET | Freq: Every day | ORAL | 2 refills | Status: DC
Start: 2022-10-04 — End: 2023-01-01

## 2022-10-04 NOTE — Addendum Note (Signed)
Addended byDoran Stabler on: 10/04/2022 09:26 AM   Modules accepted: Orders

## 2022-10-04 NOTE — Addendum Note (Signed)
Addended byDoran Stabler on: 10/04/2022 09:20 AM   Modules accepted: Orders

## 2022-10-05 NOTE — Progress Notes (Signed)
Internal Medicine Clinic Attending  Case discussed with Dr. Nguyen  At the time of the visit.  We reviewed the resident's history and exam and pertinent patient test results.  I agree with the assessment, diagnosis, and plan of care documented in the resident's note. 

## 2022-10-08 ENCOUNTER — Telehealth: Payer: Self-pay

## 2022-10-08 LAB — UA/M W/RFLX CULTURE, COMP
Bilirubin, UA: NEGATIVE
Glucose, UA: NEGATIVE
Leukocytes,UA: NEGATIVE
Nitrite, UA: NEGATIVE
RBC, UA: NEGATIVE
Specific Gravity, UA: 1.024 (ref 1.005–1.030)
Urobilinogen, Ur: 1 mg/dL (ref 0.2–1.0)
pH, UA: 5.5 (ref 5.0–7.5)

## 2022-10-08 LAB — MICROSCOPIC EXAMINATION
Casts: NONE SEEN /lpf
RBC, Urine: NONE SEEN /hpf (ref 0–2)

## 2022-10-08 LAB — URINE CULTURE, COMPREHENSIVE

## 2022-10-08 NOTE — Telephone Encounter (Signed)
Patient called regarding her urine culture, patient stated she is having burning when she urinated and she noticed blood when she wiped. Patient also wants to discuss the current medications she is taking. P[lease return patients call.

## 2022-10-11 ENCOUNTER — Emergency Department (HOSPITAL_BASED_OUTPATIENT_CLINIC_OR_DEPARTMENT_OTHER): Payer: Commercial Managed Care - PPO

## 2022-10-11 ENCOUNTER — Encounter (HOSPITAL_BASED_OUTPATIENT_CLINIC_OR_DEPARTMENT_OTHER): Payer: Self-pay

## 2022-10-11 ENCOUNTER — Emergency Department (HOSPITAL_BASED_OUTPATIENT_CLINIC_OR_DEPARTMENT_OTHER)
Admission: EM | Admit: 2022-10-11 | Discharge: 2022-10-11 | Disposition: A | Discharge status: Home / Self Care | Payer: Commercial Managed Care - PPO | Attending: Emergency Medicine | Admitting: Emergency Medicine

## 2022-10-11 ENCOUNTER — Other Ambulatory Visit: Payer: Self-pay

## 2022-10-11 DIAGNOSIS — R300 Dysuria: Secondary | ICD-10-CM | POA: Diagnosis present

## 2022-10-11 DIAGNOSIS — I10 Essential (primary) hypertension: Secondary | ICD-10-CM | POA: Diagnosis not present

## 2022-10-11 DIAGNOSIS — Z91040 Latex allergy status: Secondary | ICD-10-CM | POA: Diagnosis not present

## 2022-10-11 DIAGNOSIS — R112 Nausea with vomiting, unspecified: Secondary | ICD-10-CM | POA: Insufficient documentation

## 2022-10-11 DIAGNOSIS — Z79899 Other long term (current) drug therapy: Secondary | ICD-10-CM | POA: Diagnosis not present

## 2022-10-11 DIAGNOSIS — J45909 Unspecified asthma, uncomplicated: Secondary | ICD-10-CM | POA: Insufficient documentation

## 2022-10-11 DIAGNOSIS — N3001 Acute cystitis with hematuria: Secondary | ICD-10-CM | POA: Diagnosis not present

## 2022-10-11 LAB — COMPREHENSIVE METABOLIC PANEL
ALT: <5 U/L (ref 0–44)
AST: 11 U/L — ABNORMAL LOW (ref 15–41)
Albumin: 4.2 g/dL (ref 3.5–5.0)
Alkaline Phosphatase: 68 U/L (ref 38–126)
Anion gap: 10 (ref 5–15)
BUN: 12 mg/dL (ref 6–20)
CO2: 26 mmol/L (ref 22–32)
Calcium: 9.1 mg/dL (ref 8.9–10.3)
Chloride: 104 mmol/L (ref 98–111)
Creatinine, Ser: 0.92 mg/dL (ref 0.44–1.00)
GFR, Estimated: >60 mL/min (ref >60)
Glucose, Bld: 92 mg/dL (ref 70–99)
Potassium: 3.6 mmol/L (ref 3.5–5.1)
Sodium: 140 mmol/L (ref 135–145)
Total Bilirubin: 0.5 mg/dL (ref 0.3–1.2)
Total Protein: 7.4 g/dL (ref 6.5–8.1)

## 2022-10-11 LAB — CBC
HCT: 36.1 % (ref 36.0–46.0)
Hemoglobin: 12 g/dL (ref 12.0–15.0)
MCH: 28 pg (ref 26.0–34.0)
MCHC: 33.2 g/dL (ref 30.0–36.0)
MCV: 84.1 fL (ref 80.0–100.0)
Platelets: 349 10*3/uL (ref 150–400)
RBC: 4.29 MIL/uL (ref 3.87–5.11)
RDW: 13.6 % (ref 11.5–15.5)
WBC: 9.4 10*3/uL (ref 4.0–10.5)
nRBC: 0 % (ref 0.0–0.2)

## 2022-10-11 LAB — URINALYSIS, ROUTINE W REFLEX MICROSCOPIC
Bacteria, UA: NONE SEEN
Bilirubin Urine: NEGATIVE
Glucose, UA: NEGATIVE mg/dL
Nitrite: NEGATIVE
Protein, ur: 30 mg/dL — AB
Specific Gravity, Urine: 1.036 — ABNORMAL HIGH (ref 1.005–1.030)
pH: 5.5 (ref 5.0–8.0)

## 2022-10-11 LAB — LIPASE, BLOOD: Lipase: 58 U/L — ABNORMAL HIGH (ref 11–51)

## 2022-10-11 MED ORDER — NITROFURANTOIN MONOHYD MACRO 100 MG PO CAPS: 100.0000 mg | ORAL_CAPSULE | Freq: Two times a day (BID) | ORAL | 0 refills | Status: AC

## 2022-10-11 MED ORDER — KETOROLAC TROMETHAMINE 15 MG/ML IJ SOLN
15.0000 mg | Freq: Once | INTRAMUSCULAR | Status: AC
  Administered 2022-10-11: 15 mg via INTRAVENOUS
  Filled 2022-10-11: qty 1

## 2022-10-11 MED ORDER — ONDANSETRON HCL 4 MG PO TABS: 4.0000 mg | ORAL_TABLET | Freq: Three times a day (TID) | ORAL | 0 refills | Status: AC | PRN

## 2022-10-11 MED ORDER — SODIUM CHLORIDE 0.9 % IV SOLN
1.0000 g | Freq: Once | INTRAVENOUS | Status: AC
  Administered 2022-10-11: 1 g via INTRAVENOUS
  Filled 2022-10-11: qty 10

## 2022-10-11 NOTE — Discharge Instructions (Signed)
Thank you for letting us take care of you today.  We did not see any kidney stones or complications on your CT scan.  Your urine does appear infected.  We gave you a dose of antibiotics tonight and you can start the antibiotic prescription tomorrow.  I also prescribed Zofran to help with nausea if needed.  Please follow-up with your doctor within the next week for recheck.  Use MyChart to follow-up on gonorrhea and Chlamydia results.  If either of these is positive, seek treatment by your PCP, OB/GYN, urgent care, or return to the ED.  For any new or worsening symptoms, please return to the nearest ED for reevaluation.

## 2022-10-11 NOTE — ED Provider Notes (Signed)
[place] EMERGENCY DEPARTMENT AT [facility] Provider Note   CSN: [id number] Arrival date & time: 10/11/22  1807     History  Chief Complaint  Patient presents with   Dysuria    Meagan Dean is a 52 y.o. female with past medical history hypertension, anemia, asthma, kidney stones who presents to the ED complaining of dysuria, hematuria, and bilateral flank pain worse in the left for the last week.  History of previous UTIs.  None recently.  No recent antibiotics.  History of kidney stones as well.  Some associated nausea and occasional vomiting.  No hematemesis.  No fever, diarrhea, chest pain, shortness of breath, back pain, or other complaints.  Mended by outpatient provider to possibly have a UTI but sent to the ED for further evaluation of suspected kidney stone.  No new sexual partners.  No vaginal bleeding or discharge.  States that she would like to get checked for gonorrhea and chlamydia while here however.      Home Medications Prior to Admission medications   Medication Sig Start Date End Date Taking? Authorizing Provider  nitrofurantoin, macrocrystal-monohydrate, (MACROBID) 100 MG capsule Take 1 capsule (100 mg total) by mouth 2 (two) times daily for 5 days. 10/12/22 10/17/22 Yes Joffrey Kerce L, PA-C  ondansetron (ZOFRAN) 4 MG tablet Take 1 tablet (4 mg total) by mouth every 8 (eight) hours as needed for up to 3 days for nausea or vomiting. 10/11/22 10/14/22 Yes Chalisa Kobler L, PA-C  albuterol (VENTOLIN HFA) 108 (90 Base) MCG/ACT inhaler Inhale 2 puffs into the lungs. 08/02/22   [provider]  ipratropium-albuterol (DUONEB) 0.5-2.5 (3) MG/3ML SOLN Take 3 mLs by nebulization every 6 (six) hours as needed. 08/09/22   Netta Corrigan, PA-C  loratadine (CLARITIN) 10 MG tablet Take 10 mg by mouth daily.    [provider]  losartan (COZAAR) 25 MG tablet Take 1 tablet (25 mg total) by mouth daily. 05/16/22 05/16/23  Adron Bene, MD  potassium chloride SA  (KLOR-CON M20) 20 MEQ tablet Take 2 tablets (40 mEq total) by mouth daily. 05/16/22 08/29/22  Adron Bene, MD  Probiotic Product (PROBIOTIC PO) Take 1 capsule by mouth daily.    [provider]  spironolactone (ALDACTONE) 25 MG tablet Take 1 tablet (25 mg total) by mouth daily. 10/04/22 01/02/23  Doran Stabler, DO      Allergies    Latex, Reglan [metoclopramide], and Vicodin [hydrocodone-acetaminophen]    Review of Systems   Review of Systems  All other systems reviewed and are negative.   Physical Exam Updated Vital Signs BP (!) 139/96   Pulse 80   Temp 98 F (36.7 C) (Oral)   Resp 17   Ht 5\' 3"  (1.6 m)   Wt 73.8 kg   LMP  (LMP Unknown) Comment: 07/2016  SpO2 100%   BMI 28.82 kg/m  Physical Exam Vitals and nursing note reviewed.  Constitutional:      General: She is not in acute distress.    Appearance: Normal appearance. She is not ill-appearing, toxic-appearing or diaphoretic.  HENT:     Head: Normocephalic and atraumatic.     Mouth/Throat:     Mouth: Mucous membranes are moist.  Eyes:     Conjunctiva/sclera: Conjunctivae normal.  Cardiovascular:     Rate and Rhythm: Normal rate and regular rhythm.     Heart sounds: No murmur heard. Pulmonary:     Effort: Pulmonary effort is normal.     Breath sounds: Normal breath  sounds.  Abdominal:     General: Abdomen is flat. There is no distension.     Palpations: Abdomen is soft.     Tenderness: There is abdominal tenderness (diffuse lower). There is no right CVA tenderness, left CVA tenderness, guarding or rebound.  Musculoskeletal:        General: Normal range of motion.     Cervical back: Normal range of motion and neck supple.     Right lower leg: No edema.     Left lower leg: No edema.  Skin:    General: Skin is warm and dry.     Capillary Refill: Capillary refill takes less than 2 seconds.     Coloration: Skin is not jaundiced or pale.  Neurological:     Mental Status: She is alert. Mental status is at  baseline.  Psychiatric:        Mood and Affect: Mood normal.        Behavior: Behavior normal.     ED Results / Procedures / Treatments   Labs (all labs ordered are listed, but only abnormal results are displayed) Labs Reviewed  LIPASE, BLOOD - Abnormal; Notable for the following components:      Result Value   Lipase 58 (*)    All other components within normal limits  COMPREHENSIVE METABOLIC PANEL - Abnormal; Notable for the following components:   AST 11 (*)    All other components within normal limits  URINALYSIS, ROUTINE W REFLEX MICROSCOPIC - Abnormal; Notable for the following components:   APPearance HAZY (*)    Specific Gravity, Urine 1.036 (*)    Hgb urine dipstick MODERATE (*)    Ketones, ur TRACE (*)    Protein, ur 30 (*)    Leukocytes,Ua SMALL (*)    All other components within normal limits  CBC  GC/CHLAMYDIA PROBE AMP ([place]) NOT AT [facility]    EKG None  Radiology CT ABDOMEN PELVIS WO CONTRAST  Result Date: 10/11/2022 CLINICAL DATA:  Dysuria, hematuria, left flank pain EXAM: CT ABDOMEN AND PELVIS WITHOUT CONTRAST TECHNIQUE: Multidetector CT imaging of the abdomen and pelvis was performed following the standard protocol without IV contrast. RADIATION DOSE REDUCTION: This exam was performed according to the departmental dose-optimization program which includes automated exposure control, adjustment of the mA and/or kV according to patient size and/or use of iterative reconstruction technique. COMPARISON:  06/26/2022 FINDINGS: Lower chest: No acute abnormality. There is stable mild dilation of the distal esophagus which may reflect chronic changes related to distal functional obstruction due to gastric lap band. Small hiatal hernia. Hepatobiliary: No focal liver abnormality is seen. No gallstones, gallbladder wall thickening, or biliary dilatation. Pancreas: Unremarkable Spleen: Unremarkable Adrenals/Urinary Tract: Adrenal glands are unremarkable. Kidneys are normal,  without renal calculi, focal lesion, or hydronephrosis. Stable punctate bladder wall calcification noted anteriorly. Bladder is otherwise unremarkable. Stomach/Bowel: Gastric lap band device is in place, unchanged in orientation. An unremarkable loop of mid small bowel is seen within a broad-based umbilical hernia, unchanged from prior examination. Appendectomy has been performed. The stomach, small bowel, and large bowel are otherwise unremarkable. No free intraperitoneal gas or fluid. Vascular/Lymphatic: No significant vascular findings are present. No enlarged abdominal or pelvic lymph nodes. Reproductive: Status post hysterectomy. No adnexal masses. Other: None significant Musculoskeletal: No acute or significant osseous findings. IMPRESSION: 1. No acute intra-abdominal pathology identified. No definite radiographic explanation for the patient's reported symptoms. 2. Stable mild dilation of the distal esophagus which may reflect chronic changes related to  distal functional obstruction due to gastric lap band device. Small hiatal hernia. 3. Stable broad-based umbilical hernia containing a nondistended loop of mid small bowel. Electronically Signed   By: Helyn Numbers M.D.   On: 10/11/2022 21:41    Procedures Procedures    Medications Ordered in ED Medications  ketorolac (TORADOL) 15 MG/ML injection 15 mg (15 mg Intravenous Given 10/11/22 2058)  cefTRIAXone (ROCEPHIN) 1 g in sodium chloride 0.9 % 100 mL IVPB (0 g Intravenous Stopped 10/11/22 2243)    ED Course/ Medical Decision Making/ A&P                             Medical Decision Making Amount and/or Complexity of Data Reviewed Labs: ordered. Decision-making details documented in ED Course. Radiology: ordered. Decision-making details documented in ED Course.  Risk Prescription drug management.   Medical Decision Making:   PATI DARRAH is a 52 y.o. female who presented to the ED today with abdominal pain detailed above.    Patient's  presentation is complicated by their history of kidney stones, HTN.  Complete initial physical exam performed, notably the patient  was in NAD. Diffuse lower abdominal tenderness. Abdomen soft and non-distended. No rebound, guarding, or peritoneal signs. No CVA tenderness.    Reviewed and confirmed nursing documentation for past medical history, family history, social history.    Initial Assessment:   With the patient's presentation of abdominal pain, differential diagnosis includes but is not limited to AAA, mesenteric ischemia, appendicitis, diverticulitis, DKA, gastritis, gastroenteritis, AMI, nephrolithiasis, pancreatitis, peritonitis, adrenal insufficiency, intestinal ischemia, constipation, UTI, SBO/LBO, splenic rupture, biliary disease, IBD, IBS, PUD, hepatitis, STD, ovarian/testicular torsion, electrolyte disturbance, DKA, dehydration, acute kidney injury, renal failure, cholecystitis, cholelithiasis, choledocholithiasis, abdominal pain of  unknown etiology.   Initial Plan:  Screening labs including CBC and Metabolic panel to evaluate for infectious or metabolic etiology of disease.  Lipase to evaluate for pancreatitis Urinalysis with reflex culture ordered to evaluate for UTI or relevant urologic/nephrologic pathology.  CT abd/pelvis to evaluate for intra-abdominal pathology GC for evaluation of STD Symptomatic management Objective evaluation as reviewed   Initial Study Results:   Laboratory  All laboratory results reviewed without evidence of clinically relevant pathology.   Exceptions include: Lipase 58, AST 11, UA positive for hemoglobin and leukocytes   Radiology:  All images reviewed independently. Agree with radiology report at this time.   CT ABDOMEN PELVIS WO CONTRAST  Result Date: 10/11/2022 CLINICAL DATA:  Dysuria, hematuria, left flank pain EXAM: CT ABDOMEN AND PELVIS WITHOUT CONTRAST TECHNIQUE: Multidetector CT imaging of the abdomen and pelvis was performed following  the standard protocol without IV contrast. RADIATION DOSE REDUCTION: This exam was performed according to the departmental dose-optimization program which includes automated exposure control, adjustment of the mA and/or kV according to patient size and/or use of iterative reconstruction technique. COMPARISON:  06/26/2022 FINDINGS: Lower chest: No acute abnormality. There is stable mild dilation of the distal esophagus which may reflect chronic changes related to distal functional obstruction due to gastric lap band. Small hiatal hernia. Hepatobiliary: No focal liver abnormality is seen. No gallstones, gallbladder wall thickening, or biliary dilatation. Pancreas: Unremarkable Spleen: Unremarkable Adrenals/Urinary Tract: Adrenal glands are unremarkable. Kidneys are normal, without renal calculi, focal lesion, or hydronephrosis. Stable punctate bladder wall calcification noted anteriorly. Bladder is otherwise unremarkable. Stomach/Bowel: Gastric lap band device is in place, unchanged in orientation. An unremarkable loop of mid small bowel is seen within a broad-based  umbilical hernia, unchanged from prior examination. Appendectomy has been performed. The stomach, small bowel, and large bowel are otherwise unremarkable. No free intraperitoneal gas or fluid. Vascular/Lymphatic: No significant vascular findings are present. No enlarged abdominal or pelvic lymph nodes. Reproductive: Status post hysterectomy. No adnexal masses. Other: None significant Musculoskeletal: No acute or significant osseous findings. IMPRESSION: 1. No acute intra-abdominal pathology identified. No definite radiographic explanation for the patient's reported symptoms. 2. Stable mild dilation of the distal esophagus which may reflect chronic changes related to distal functional obstruction due to gastric lap band device. Small hiatal hernia. 3. Stable broad-based umbilical hernia containing a nondistended loop of mid small bowel. Electronically  Signed   By: Helyn Numbers M.D.   On: 10/11/2022 21:41     Final Assessment and Plan:    52 year old female presents to the ED complaining of lower abdominal pain and dysuria.  Concern for UTI but also with history of kidney stones concern for this.  No recent antibiotics.  Diffuse lower abdominal tenderness.  Somewhat worsening pain on the left compared to the right.  No fever.  Patient nontoxic-appearing.  Associated nausea and mild emesis.  Workup initiated as above for further assessment.  Patient with good pain control with Toradol and good nausea control with Zofran.  UA does appear questionably infected but also slightly contaminated.  No leukocytosis, no AKI, no significant electrolyte disturbance.  Very mildly elevated lipase.  Low suspicion for pancreatitis given patient's area of pain and history provided.  CT without acute findings.  No evidence of ureterolithiasis. Will treat for UTI. Dose ceftriaxone given in ED, pt will start Macrobid tomorrow. Pt agreeable with plan. Strict ED return precautions given, all questions answered, and stable for discharge.    Clinical Impression:  1. Acute cystitis with hematuria      Discharge           Final Clinical Impression(s) / ED Diagnoses Final diagnoses:  Acute cystitis with hematuria    Rx / DC Orders ED Discharge Orders          Ordered    nitrofurantoin, macrocrystal-monohydrate, (MACROBID) 100 MG capsule  2 times daily        10/11/22 2153    ondansetron (ZOFRAN) 4 MG tablet  Every 8 hours PRN        10/11/22 2153              Richardson Dopp 10/11/22 2338    Rondel Baton, MD 10/14/22 [phone]

## 2022-10-11 NOTE — ED Notes (Signed)
PA Mariah at bedside with patient.

## 2022-10-11 NOTE — ED Notes (Signed)
Patient transported to CT 

## 2022-10-11 NOTE — ED Notes (Signed)
RN reviewed discharge instructions with pt. Pt verbalized understanding and had no further questions. VSS upon discharge.  

## 2022-10-11 NOTE — ED Triage Notes (Signed)
Patient here POV from Home.  Endorses Dysuria and Hematuria for about a week. Then noted Left Sided Flank Pain 3-4 Days. UA was mostly unremarkable with PCP.   History of Renal Stones. Some nausea. Mild Emesis.   NAD Noted during Triage. A&Ox4. GCS 15. Ambulatory.

## 2022-10-14 LAB — GC/CHLAMYDIA PROBE AMP (~~LOC~~) NOT AT ARMC
Chlamydia: NEGATIVE
Comment: NEGATIVE
Comment: NORMAL
Neisseria Gonorrhea: NEGATIVE

## 2022-10-17 ENCOUNTER — Other Ambulatory Visit: Payer: Self-pay

## 2022-10-17 ENCOUNTER — Telehealth: Payer: Self-pay | Admitting: Student

## 2022-10-17 NOTE — Telephone Encounter (Signed)
Called patient. Last BMP at [facility] 3.2 after which patient stopped Losartan and had been taking Spironolactone 25 mg daily and of Kclor. Repeat BMP in the ED for dysuria evaluation with K of 3.6 with same therapy. Discussed proteinuria and BP, which has been elevated at home. Made plan to restart Losartan 25 mg, continue Spironolactone 25 mg, and DECREASE Kchlor to 20 mgEq daily. She has a lab only appointment for 6/14 at [facility] for BMP - will follow up BMP then. Will review her home BP log then as well and make changes according to results.  Discussed with Dr. Sol Blazing.  Morene Crocker, MD

## 2022-10-19 ENCOUNTER — Other Ambulatory Visit: Payer: Self-pay

## 2022-10-19 ENCOUNTER — Emergency Department (HOSPITAL_BASED_OUTPATIENT_CLINIC_OR_DEPARTMENT_OTHER)
Admission: EM | Admit: 2022-10-19 | Discharge: 2022-10-19 | Disposition: A | Discharge status: Home / Self Care | Payer: Commercial Managed Care - PPO | Attending: Emergency Medicine | Admitting: Emergency Medicine

## 2022-10-19 ENCOUNTER — Encounter (HOSPITAL_BASED_OUTPATIENT_CLINIC_OR_DEPARTMENT_OTHER): Payer: Self-pay | Admitting: Emergency Medicine

## 2022-10-19 DIAGNOSIS — I10 Essential (primary) hypertension: Secondary | ICD-10-CM | POA: Insufficient documentation

## 2022-10-19 DIAGNOSIS — Z91040 Latex allergy status: Secondary | ICD-10-CM | POA: Diagnosis not present

## 2022-10-19 DIAGNOSIS — M79604 Pain in right leg: Secondary | ICD-10-CM | POA: Insufficient documentation

## 2022-10-19 DIAGNOSIS — J45909 Unspecified asthma, uncomplicated: Secondary | ICD-10-CM | POA: Diagnosis not present

## 2022-10-19 DIAGNOSIS — M791 Myalgia, unspecified site: Secondary | ICD-10-CM

## 2022-10-19 DIAGNOSIS — M79605 Pain in left leg: Secondary | ICD-10-CM | POA: Insufficient documentation

## 2022-10-19 LAB — CBC WITH DIFFERENTIAL/PLATELET
Abs Immature Granulocytes: 0.02 10*3/uL (ref 0.00–0.07)
Basophils Absolute: 0 10*3/uL (ref 0.0–0.1)
Basophils Relative: 0 %
Eosinophils Absolute: 0 10*3/uL (ref 0.0–0.5)
Eosinophils Relative: 0 %
HCT: 34.9 % — ABNORMAL LOW (ref 36.0–46.0)
Hemoglobin: 11.8 g/dL — ABNORMAL LOW (ref 12.0–15.0)
Immature Granulocytes: 0 %
Lymphocytes Relative: 34 %
Lymphs Abs: 3.3 10*3/uL (ref 0.7–4.0)
MCH: 28.5 pg (ref 26.0–34.0)
MCHC: 33.8 g/dL (ref 30.0–36.0)
MCV: 84.3 fL (ref 80.0–100.0)
Monocytes Absolute: 0.7 10*3/uL (ref 0.1–1.0)
Monocytes Relative: 7 %
Neutro Abs: 5.8 10*3/uL (ref 1.7–7.7)
Neutrophils Relative %: 59 %
Platelets: 365 10*3/uL (ref 150–400)
RBC: 4.14 MIL/uL (ref 3.87–5.11)
RDW: 14.1 % (ref 11.5–15.5)
WBC: 9.9 10*3/uL (ref 4.0–10.5)
nRBC: 0 % (ref 0.0–0.2)

## 2022-10-19 LAB — BASIC METABOLIC PANEL
Anion gap: 8 (ref 5–15)
BUN: 18 mg/dL (ref 6–20)
CO2: 28 mmol/L (ref 22–32)
Calcium: 9.8 mg/dL (ref 8.9–10.3)
Chloride: 102 mmol/L (ref 98–111)
Creatinine, Ser: 0.92 mg/dL (ref 0.44–1.00)
GFR, Estimated: >60 mL/min (ref >60)
Glucose, Bld: 96 mg/dL (ref 70–99)
Potassium: 3.9 mmol/L (ref 3.5–5.1)
Sodium: 138 mmol/L (ref 135–145)

## 2022-10-19 LAB — MAGNESIUM: Magnesium: 1.9 mg/dL (ref 1.7–2.4)

## 2022-10-19 LAB — CK: Total CK: 49 U/L (ref 38–234)

## 2022-10-19 MED ORDER — LACTATED RINGERS IV BOLUS
1000.0000 mL | Freq: Once | INTRAVENOUS | Status: AC
  Administered 2022-10-19: 1000 mL via INTRAVENOUS

## 2022-10-19 NOTE — ED Triage Notes (Signed)
Pt arrives via POV ambulatory in triage d/t concerns for bilateral leg pain described as cramping that started yesterday at 10 pm. "My ligaments feel like they're frozen." No swelling. No trauma/injury. Ambulatory in triage. Currently taking potassium supplements.

## 2022-10-19 NOTE — Discharge Instructions (Signed)
Keep yourself hydrated.  Take your potassium supplementation as prescribed.  Return tomorrow for an ultrasound of your leg to rule out blood clot at the site of the "knot".  Return to the ED with difficulty breathing, chest pain, worsening pain or any other concerns.

## 2022-10-19 NOTE — ED Provider Notes (Signed)
[place] EMERGENCY DEPARTMENT AT [facility] Provider Note   CSN: [id number] Arrival date & time: 10/19/22  0141     History  Chief Complaint  Patient presents with   Leg Pain    Bilateral    Meagan Dean is a 52 y.o. female.  52 y.o. living with hypertension, asthma, chronic hypokalemia presenting with cramps to her legs that onset about 10 PM.  States she developed a tightness to her right foot and ankle and severe cramps that lasted about 5 minutes then resolved.  She then had them to her left leg involving her foot and her ankle that lasted for "longer than 5 minutes and have since resolved.  Has had no cramps in the past several hours.  Feels like her cramps have improved but were quite severe and she was concerned about her potassium levels.  She has a history of chronically low potassium and she was recently told by her PCP to reduce her potassium dose from 2 tablets a day to once a day.  Was also recently restarted on spironolactone.  States compliance with her medications.  Denies any chest pain or shortness of breath.  Denies any leg pain or leg swelling currently.  No fevers, chills, nausea or vomiting.  No focal weakness, numbness or tingling. No history of DVT or blood clots  The history is provided by the patient.  Leg Pain Associated symptoms: no fever        Home Medications Prior to Admission medications   Medication Sig Start Date End Date Taking? Authorizing Provider  albuterol (VENTOLIN HFA) 108 (90 Base) MCG/ACT inhaler Inhale 2 puffs into the lungs. 08/02/22   [provider]  ipratropium-albuterol (DUONEB) 0.5-2.5 (3) MG/3ML SOLN Take 3 mLs by nebulization every 6 (six) hours as needed. 08/09/22   Netta Corrigan, PA-C  loratadine (CLARITIN) 10 MG tablet Take 10 mg by mouth daily.    [provider]  losartan (COZAAR) 25 MG tablet Take 1 tablet (25 mg total) by mouth daily. 05/16/22 05/16/23  Adron Bene, MD  potassium chloride SA  (KLOR-CON M20) 20 MEQ tablet Take 1 tablet (20 mEq total) by mouth daily. 10/17/22   Morene Crocker, MD  Probiotic Product (PROBIOTIC PO) Take 1 capsule by mouth daily.    [provider]  spironolactone (ALDACTONE) 25 MG tablet Take 1 tablet (25 mg total) by mouth daily. 10/04/22 01/02/23  Doran Stabler, DO      Allergies    Latex, Reglan [metoclopramide], and Vicodin [hydrocodone-acetaminophen]    Review of Systems   Review of Systems  Constitutional:  Negative for activity change, appetite change and fever.  HENT:  Negative for congestion and rhinorrhea.   Respiratory:  Negative for cough, chest tightness and shortness of breath.   Cardiovascular:  Negative for chest pain.  Gastrointestinal:  Negative for abdominal pain, nausea and vomiting.  Genitourinary:  Negative for dysuria and hematuria.  Musculoskeletal:  Positive for arthralgias and myalgias.  Skin:  Negative for rash.  Neurological:  Negative for dizziness, weakness and headaches.    all other systems are negative except as noted in the HPI and PMH.   Physical Exam Updated Vital Signs BP (!) 142/110 (BP Location: Right Wrist) Comment: x3  Pulse 95   Temp 98.4 F (36.9 C) (Oral)   Resp 20   Ht 5\' 3"  (1.6 m)   Wt 73.8 kg   LMP  (LMP Unknown) Comment: 07/2016  SpO2 97%   BMI 28.82 kg/m  Physical Exam Vitals and nursing note reviewed.  Constitutional:      General: She is not in acute distress.    Appearance: She is well-developed.  HENT:     Head: Normocephalic and atraumatic.     Mouth/Throat:     Pharynx: No oropharyngeal exudate.  Eyes:     Conjunctiva/sclera: Conjunctivae normal.     Pupils: Pupils are equal, round, and reactive to light.  Neck:     Comments: No meningismus. Cardiovascular:     Rate and Rhythm: Normal rate and regular rhythm.     Heart sounds: Normal heart sounds. No murmur heard. Pulmonary:     Effort: Pulmonary effort is normal. No respiratory distress.     Breath  sounds: Normal breath sounds.  Abdominal:     Palpations: Abdomen is soft.     Tenderness: There is no abdominal tenderness. There is no guarding or rebound.  Musculoskeletal:        General: No swelling or tenderness. Normal range of motion.     Cervical back: Normal range of motion and neck supple.     Comments: Lower extremities normal in appearance.  No asymmetry or swelling.  No calf tenderness.  Intact DP and PT pulses.  Full range of motion bilateral hips, knees and ankles  Skin:    General: Skin is warm.  Neurological:     Mental Status: She is alert and oriented to person, place, and time.     Cranial Nerves: No cranial nerve deficit.     Motor: No abnormal muscle tone.     Coordination: Coordination normal.     Comments:  5/5 strength throughout. CN 2-12 intact.Equal grip strength.   Psychiatric:        Behavior: Behavior normal.     ED Results / Procedures / Treatments   Labs (all labs ordered are listed, but only abnormal results are displayed) Labs Reviewed  CBC WITH DIFFERENTIAL/PLATELET - Abnormal; Notable for the following components:      Result Value   Hemoglobin 11.8 (*)    HCT 34.9 (*)    All other components within normal limits  BASIC METABOLIC PANEL  CK  MAGNESIUM    EKG None  Radiology No results found.  Procedures Procedures    Medications Ordered in ED Medications  lactated ringers bolus 1,000 mL (has no administration in time range)    ED Course/ Medical Decision Making/ A&P                             Medical Decision Making Amount and/or Complexity of Data Reviewed Labs: ordered. Decision-making details documented in ED Course. Radiology: ordered and independent interpretation performed. Decision-making details documented in ED Course. ECG/medicine tests: ordered and independent interpretation performed. Decision-making details documented in ED Course.   Lower extremity cramps with history of hypokalemia.  Vital stable, no  distress.  Neurovascular intact.  Fluids given.  She declines pain medication at this time.  Potassium normal at 3.9.  Magnesium level normal.  CK is normal  Low suspicion for DVT given bilateral nature of the pain which is now resolved.  No significant swelling noted.  Does complain of "knot" to right posterior calf which is new since she developed the cramps.  Pain has resolved.  Instructed to increase her fluid intake at home as well as potassium supplementation as prescribed.  Will follow-up tomorrow for Doppler ultrasound to rule out DVT though this seems less  likely.  Follow-up with PCP.  Return precautions discussed.       Final Clinical Impression(s) / ED Diagnoses Final diagnoses:  None    Rx / DC Orders ED Discharge Orders     None         Tephanie Escorcia, Jeannett Senior, MD 10/19/22 [phone]

## 2022-10-20 ENCOUNTER — Other Ambulatory Visit (HOSPITAL_BASED_OUTPATIENT_CLINIC_OR_DEPARTMENT_OTHER): Payer: Self-pay | Admitting: Emergency Medicine

## 2022-10-20 ENCOUNTER — Ambulatory Visit (HOSPITAL_BASED_OUTPATIENT_CLINIC_OR_DEPARTMENT_OTHER)
Admission: RE | Admit: 2022-10-20 | Discharge: 2022-10-20 | Disposition: A | Discharge status: Home / Self Care | Payer: Commercial Managed Care - PPO | Source: Ambulatory Visit | Attending: Emergency Medicine | Admitting: Emergency Medicine

## 2022-10-20 DIAGNOSIS — M791 Myalgia, unspecified site: Secondary | ICD-10-CM

## 2022-10-25 ENCOUNTER — Other Ambulatory Visit: Payer: Self-pay | Admitting: Student

## 2022-10-25 ENCOUNTER — Ambulatory Visit (INDEPENDENT_AMBULATORY_CARE_PROVIDER_SITE_OTHER): Payer: Commercial Managed Care - PPO | Admitting: *Deleted

## 2022-10-25 ENCOUNTER — Encounter: Payer: Self-pay | Admitting: *Deleted

## 2022-10-25 DIAGNOSIS — E876 Hypokalemia: Secondary | ICD-10-CM

## 2022-10-25 NOTE — Progress Notes (Signed)
    Meagan Dean presented today for blood pressure check. Patient is prescribed blood pressure medications and I confirmed that patient did take their blood pressure medication prior to today's appointment. Blood pressure was taken in the usual and appropriate manner using an automated BP cuff.     Vitals:   10/25/22 0938  BP: 110/71   Patient had episodes of severe foot cramping on 6/7. Was seen in ED and given fluids. K+ was 3.9 at that time. Repeat BMP done today. Patient thinks she may have missed one dose of potassium 20 mEq last week. Provided pill box and [facility] med bag. She scheduled f/u with Provider for 6/24 at 0845.   Results of today's visit will be routed to Dr. Cyndie Chime for review and further management.

## 2022-10-25 NOTE — Progress Notes (Signed)
Patient needs follow up BMP after medication changes

## 2022-10-26 ENCOUNTER — Telehealth: Payer: Self-pay | Admitting: Student

## 2022-10-26 LAB — BMP8+ANION GAP
Anion Gap: 14 mmol/L (ref 10.0–18.0)
BUN/Creatinine Ratio: 10 (ref 9–23)
BUN: 9 mg/dL (ref 6–24)
CO2: 24 mmol/L (ref 20–29)
Calcium: 9.5 mg/dL (ref 8.7–10.2)
Chloride: 103 mmol/L (ref 96–106)
Creatinine, Ser: 0.9 mg/dL (ref 0.57–1.00)
Glucose: 90 mg/dL (ref 70–99)
Potassium: 3.9 mmol/L (ref 3.5–5.2)
Sodium: 141 mmol/L (ref 134–144)
eGFR: 77 mL/min/{1.73_m2} (ref >59)

## 2022-10-26 NOTE — Telephone Encounter (Signed)
Patient's blood pressure is lower after starting Spironolactone but still within normal limit. She endorses mild lightheadedness yesterday but felt normal today after increase fluid intake. Her K was 3.9 on BMP 1 day ago. Overall patient is pleased with her progress.   Patient will follow up with Dr. Aundria Rud on 6/24. She will notify us for any orthostatic symptoms.

## 2022-11-04 ENCOUNTER — Ambulatory Visit: Payer: Commercial Managed Care - PPO

## 2022-11-04 NOTE — Progress Notes (Signed)
Patient presented to clinic. Was called by staff for follow up but she is unclear why. She recently had a lab only visit to measure her potasium after starting spironolactone. She does not have any acute concerns and does not need blood work. Changed her visit today to a  BP visit. BP 109/86. Patient appears to have been having some episodes of dizziness with standing every few days per chart review, today says she still has this every few days and it will pass fairly quickly. Discussed drinking plenty of fluids. Also discussed that she should get BP cuff, measure BP daily for a week alternating between morning, midday, evening measurements. If persistently lower BP will consider reducing dose or changing BP meds. She says she will get the cuff today and send a mychart message with measurements. 

## 2022-11-04 NOTE — Progress Notes (Deleted)
Patient presented to clinic. Was called by staff for follow up but she is unclear why. She recently had a lab only visit to measure her potasium after starting spironolactone. She does not have any acute concerns and does not need blood work. Changed her visit today to a  BP visit. BP 109/86. Patient appears to have been having some episodes of dizziness with standing every few days per chart review, today says she still has this every few days and it will pass fairly quickly. Discussed drinking plenty of fluids. Also discussed that she should get BP cuff, measure BP daily for a week alternating between morning, midday, evening measurements. If persistently lower BP will consider reducing dose or changing BP meds. She says she will get the cuff today and send a mychart message with measurements.

## 2022-11-04 NOTE — Progress Notes (Signed)
    Meagan Dean presented today for blood pressure check. Patient is prescribed blood pressure medications and I confirmed that patient did take their blood pressure medication prior to today's appointment. Blood pressure was taken in the usual and appropriate manner using an automated BP cuff.     Vitals:   11/04/22 0859  BP: 105/85      Results of today's visit will be routed to Dr. Aundria Rud for review and further management.

## 2022-11-21 ENCOUNTER — Ambulatory Visit (INDEPENDENT_AMBULATORY_CARE_PROVIDER_SITE_OTHER): Payer: Commercial Managed Care - PPO | Admitting: Internal Medicine

## 2022-11-21 ENCOUNTER — Encounter: Payer: Self-pay | Admitting: Internal Medicine

## 2022-11-21 ENCOUNTER — Ambulatory Visit: Payer: Commercial Managed Care - PPO | Attending: Internal Medicine | Admitting: Internal Medicine

## 2022-11-21 VITALS — BP 132/84 | HR 106 | Ht 62.0 in | Wt 164.2 lb

## 2022-11-21 VITALS — BP 128/91 | HR 88 | Temp 98.2°F | Ht 63.0 in | Wt 161.0 lb

## 2022-11-21 DIAGNOSIS — M791 Myalgia, unspecified site: Secondary | ICD-10-CM | POA: Diagnosis not present

## 2022-11-21 DIAGNOSIS — S46912A Strain of unspecified muscle, fascia and tendon at shoulder and upper arm level, left arm, initial encounter: Secondary | ICD-10-CM

## 2022-11-21 DIAGNOSIS — T148XXA Other injury of unspecified body region, initial encounter: Secondary | ICD-10-CM

## 2022-11-21 MED ORDER — IBUPROFEN 800 MG PO TABS
800.0000 mg | ORAL_TABLET | Freq: Three times a day (TID) | ORAL | 0 refills | Status: AC
Start: 2022-11-21 — End: 2022-11-24

## 2022-11-21 MED ORDER — ACETAMINOPHEN 500 MG PO TABS
1000.0000 mg | ORAL_TABLET | Freq: Four times a day (QID) | ORAL | 0 refills | Status: DC
Start: 2022-11-21 — End: 2022-11-29

## 2022-11-21 MED ORDER — METHOCARBAMOL 750 MG PO TABS
1500.0000 mg | ORAL_TABLET | Freq: Three times a day (TID) | ORAL | 0 refills | Status: AC
Start: 2022-11-21 — End: 2022-11-24

## 2022-11-21 NOTE — Patient Instructions (Signed)
Medication Instructions:  NO CHANGES  *If you need a refill on your cardiac medications before your next appointment, please call your pharmacy*  Follow-Up: At [facility], you and your health needs are our priority.  As part of our continuing mission to provide you with exceptional heart care, we have created designated Provider Care Teams.  These Care Teams include your primary Cardiologist (physician) and Advanced Practice Providers (APPs -  Physician Assistants and Nurse Practitioners) who all work together to provide you with the care you need, when you need it.  We recommend signing up for the patient portal called "MyChart".  Sign up information is provided on this After Visit Summary.  MyChart is used to connect with patients for Virtual Visits (Telemedicine).  Patients are able to view lab/test results, encounter notes, upcoming appointments, etc.  Non-urgent messages can be sent to your provider as well.   To learn more about what you can do with MyChart, go to [email or web address].    Your next appointment:    AS NEEDED with Dr. Branch 
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
## 2022-11-21 NOTE — Patient Instructions (Signed)
Thank you, Ms.Allison Quarry for allowing [place] to provide your care today.  Left shoulder pain Your muscles of your shoulder feel inflamed. For the next few days please take tylenol 1000mg  every6 hours, ibuprofen 800 mg every 8 hours, and robaxin every 8 hours.  Try using ice for 10 minutes at a time 2 times daily. If pain does not improve please come back in.  I have ordered the following medication/changed the following medications:   Stop the following medications: There are no discontinued medications.   Start the following medications: Meds ordered this encounter  Medications   methocarbamol (ROBAXIN) 750 MG tablet    Sig: Take 2 tablets (1,500 mg total) by mouth 3 (three) times daily for 3 days.    Dispense:  18 tablet    Refill:  0   acetaminophen (TYLENOL) 500 MG tablet    Sig: Take 2 tablets (1,000 mg total) by mouth every 6 (six) hours.    Dispense:  30 tablet    Refill:  0   ibuprofen (ADVIL) 800 MG tablet    Sig: Take 1 tablet (800 mg total) by mouth every 8 (eight) hours for 3 days.    Dispense:  9 tablet    Refill:  0     Follow up: as needed  We look forward to seeing you next time. Please call our clinic at [phone] if you have any questions or concerns. The best time to call is Monday-Friday from 9am-4pm, but there is someone available 24/7. If after hours or the weekend, call the main hospital number and ask for the Internal Medicine Resident On-Call. If you need medication refills, please notify your pharmacy one week in advance and they will send [place] a request.   Thank you for trusting me with your care. Wishing you the best!   Rudene Christians, DO [facility]

## 2022-11-22 ENCOUNTER — Other Ambulatory Visit: Payer: Self-pay

## 2022-11-22 ENCOUNTER — Emergency Department (HOSPITAL_COMMUNITY): Payer: Commercial Managed Care - PPO

## 2022-11-22 ENCOUNTER — Emergency Department (HOSPITAL_COMMUNITY)
Admission: EM | Admit: 2022-11-22 | Discharge: 2022-11-22 | Disposition: A | Discharge status: Home / Self Care | Payer: Commercial Managed Care - PPO | Attending: Emergency Medicine | Admitting: Emergency Medicine

## 2022-11-22 ENCOUNTER — Encounter (HOSPITAL_COMMUNITY): Payer: Self-pay

## 2022-11-22 DIAGNOSIS — S46912A Strain of unspecified muscle, fascia and tendon at shoulder and upper arm level, left arm, initial encounter: Secondary | ICD-10-CM | POA: Insufficient documentation

## 2022-11-22 DIAGNOSIS — R9389 Abnormal findings on diagnostic imaging of other specified body structures: Secondary | ICD-10-CM | POA: Diagnosis not present

## 2022-11-22 DIAGNOSIS — R202 Paresthesia of skin: Secondary | ICD-10-CM | POA: Insufficient documentation

## 2022-11-22 DIAGNOSIS — M542 Cervicalgia: Secondary | ICD-10-CM | POA: Diagnosis present

## 2022-11-22 DIAGNOSIS — M79602 Pain in left arm: Secondary | ICD-10-CM | POA: Insufficient documentation

## 2022-11-22 DIAGNOSIS — R930 Abnormal findings on diagnostic imaging of skull and head, not elsewhere classified: Secondary | ICD-10-CM | POA: Diagnosis not present

## 2022-11-22 DIAGNOSIS — R200 Anesthesia of skin: Secondary | ICD-10-CM | POA: Insufficient documentation

## 2022-11-22 DIAGNOSIS — R29898 Other symptoms and signs involving the musculoskeletal system: Secondary | ICD-10-CM

## 2022-11-22 DIAGNOSIS — R531 Weakness: Secondary | ICD-10-CM | POA: Diagnosis not present

## 2022-11-22 DIAGNOSIS — M5412 Radiculopathy, cervical region: Secondary | ICD-10-CM | POA: Diagnosis not present

## 2022-11-22 DIAGNOSIS — Z91040 Latex allergy status: Secondary | ICD-10-CM | POA: Diagnosis not present

## 2022-11-22 LAB — CBC WITH DIFFERENTIAL/PLATELET
Abs Immature Granulocytes: 0.01 10*3/uL (ref 0.00–0.07)
Basophils Absolute: 0 10*3/uL (ref 0.0–0.1)
Basophils Relative: 0 %
Eosinophils Absolute: 0 10*3/uL (ref 0.0–0.5)
Eosinophils Relative: 1 %
HCT: 36.7 % (ref 36.0–46.0)
Hemoglobin: 11.7 g/dL — ABNORMAL LOW (ref 12.0–15.0)
Immature Granulocytes: 0 %
Lymphocytes Relative: 29 %
Lymphs Abs: 1.8 10*3/uL (ref 0.7–4.0)
MCH: 27.9 pg (ref 26.0–34.0)
MCHC: 31.9 g/dL (ref 30.0–36.0)
MCV: 87.6 fL (ref 80.0–100.0)
Monocytes Absolute: 0.3 10*3/uL (ref 0.1–1.0)
Monocytes Relative: 5 %
Neutro Abs: 4.2 10*3/uL (ref 1.7–7.7)
Neutrophils Relative %: 65 %
Platelets: 343 10*3/uL (ref 150–400)
RBC: 4.19 MIL/uL (ref 3.87–5.11)
RDW: 14 % (ref 11.5–15.5)
WBC: 6.4 10*3/uL (ref 4.0–10.5)
nRBC: 0 % (ref 0.0–0.2)

## 2022-11-22 LAB — MAGNESIUM: Magnesium: 2.2 mg/dL (ref 1.7–2.4)

## 2022-11-22 LAB — COMPREHENSIVE METABOLIC PANEL
ALT: 9 U/L (ref 0–44)
AST: 12 U/L — ABNORMAL LOW (ref 15–41)
Albumin: 3.8 g/dL (ref 3.5–5.0)
Alkaline Phosphatase: 66 U/L (ref 38–126)
Anion gap: 6 (ref 5–15)
BUN: 15 mg/dL (ref 6–20)
CO2: 24 mmol/L (ref 22–32)
Calcium: 8.7 mg/dL — ABNORMAL LOW (ref 8.9–10.3)
Chloride: 111 mmol/L (ref 98–111)
Creatinine, Ser: 0.94 mg/dL (ref 0.44–1.00)
GFR, Estimated: >60 mL/min (ref >60)
Glucose, Bld: 89 mg/dL (ref 70–99)
Potassium: 3.5 mmol/L (ref 3.5–5.1)
Sodium: 141 mmol/L (ref 135–145)
Total Bilirubin: 0.4 mg/dL (ref 0.3–1.2)
Total Protein: 7 g/dL (ref 6.5–8.1)

## 2022-11-22 MED ORDER — GADOBUTROL 1 MMOL/ML IV SOLN
7.0000 mL | Freq: Once | INTRAVENOUS | Status: AC | PRN
  Administered 2022-11-22: 7 mL via INTRAVENOUS

## 2022-11-22 MED ORDER — ONDANSETRON 4 MG PO TBDP: 4.0000 mg | ORAL_TABLET | Freq: Three times a day (TID) | ORAL | 0 refills | Status: AC | PRN

## 2022-11-22 MED ORDER — METHYLPREDNISOLONE 4 MG PO TBPK: ORAL_TABLET | ORAL | 0 refills | Status: DC

## 2022-11-22 MED ORDER — HYDROMORPHONE HCL 1 MG/ML IJ SOLN
1.0000 mg | Freq: Once | INTRAMUSCULAR | Status: AC
  Administered 2022-11-22: 1 mg via INTRAVENOUS
  Filled 2022-11-22: qty 1

## 2022-11-22 MED ORDER — ONDANSETRON HCL 4 MG/2ML IJ SOLN
4.0000 mg | Freq: Once | INTRAMUSCULAR | Status: AC
  Administered 2022-11-22: 4 mg via INTRAVENOUS
  Filled 2022-11-22: qty 2

## 2022-11-22 MED ORDER — GABAPENTIN 600 MG PO TABS: 300.0000 mg | ORAL_TABLET | Freq: Three times a day (TID) | ORAL | 0 refills | Status: DC

## 2022-11-22 MED ORDER — LORAZEPAM 2 MG/ML IJ SOLN
0.5000 mg | Freq: Once | INTRAMUSCULAR | Status: AC
  Administered 2022-11-22: 0.5 mg via INTRAVENOUS
  Filled 2022-11-22: qty 1

## 2022-11-22 MED ORDER — OXYCODONE-ACETAMINOPHEN 5-325 MG PO TABS
1.0000 | ORAL_TABLET | Freq: Once | ORAL | Status: AC
  Administered 2022-11-22: 1 via ORAL
  Filled 2022-11-22 (×2): qty 1

## 2022-11-22 MED ORDER — OXYCODONE-ACETAMINOPHEN 5-325 MG PO TABS: 1.0000 | ORAL_TABLET | ORAL | 0 refills | Status: DC | PRN
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
## 2022-11-22 NOTE — ED Triage Notes (Signed)
The pt bib EMS. The pt called EMS and informed that she had neck pain and arm x3 days. The pt stated she was walking into [employer] and feel like she was going to faint. She denies any n/v. She was seen last week at [facility] for the same symptoms. Denies any CP. VS B/P 152/96, P 87, CBG 101, R 2, O2 Sats 98%RA.

## 2022-11-22 NOTE — Assessment & Plan Note (Signed)
She denies trauma or injury to left shoulder. She woke up and shoulder was very painful to move about 4 days ago. She has been taking tylenol and BC powders without improvement in pain. Her shoulder hurts in the posterior back and radiates into shoulder and neck. Pain is worsened when she is sitting down and she feels like she has to hold her arm is place. A:  Exam most consistent with muscle strain of upper back. No signs concerning for gout and pain is not localized over joint. I was unable to complete special testing as pain limited exam. P: Tylenol 1000mg  q 8hrs Ibuprofen 800 mg q 8 hrs Methocarbamol 1500 mg TID for 3 days Follow-up next week if pain is not improved

## 2022-11-22 NOTE — Discharge Instructions (Signed)
Your history, exam, and evaluation today revealed a likely nerve cause of your pain in the left neck going to left arm.  The MRIs we did today did not show acute fracture or an acute surgical problem however I did call the neurosurgery team who wants to see you in clinic within the next week or so.  We incidentally also saw only small abnormality on the pituitary gland so please follow-up with neurosurgery for this as well.  Please rest and stay hydrated and use the medications they recommended to help with symptoms.  If any symptoms change or worsen acutely, please return to the nearest emergency department.

## 2022-11-22 NOTE — ED Notes (Signed)
Patient began vomiting    doctor notified about the same   zofran has been ordered

## 2022-11-25 ENCOUNTER — Emergency Department (HOSPITAL_COMMUNITY): Payer: Commercial Managed Care - PPO

## 2022-11-25 ENCOUNTER — Emergency Department (HOSPITAL_COMMUNITY)
Admission: EM | Admit: 2022-11-25 | Discharge: 2022-11-26 | Disposition: A | Discharge status: Home / Self Care | Payer: Commercial Managed Care - PPO | Attending: Emergency Medicine | Admitting: Emergency Medicine

## 2022-11-25 ENCOUNTER — Other Ambulatory Visit: Payer: Self-pay

## 2022-11-25 ENCOUNTER — Encounter (HOSPITAL_COMMUNITY): Payer: Self-pay

## 2022-11-25 DIAGNOSIS — E876 Hypokalemia: Secondary | ICD-10-CM | POA: Diagnosis not present

## 2022-11-25 DIAGNOSIS — Z91040 Latex allergy status: Secondary | ICD-10-CM | POA: Insufficient documentation

## 2022-11-25 DIAGNOSIS — M25512 Pain in left shoulder: Secondary | ICD-10-CM | POA: Diagnosis present

## 2022-11-25 DIAGNOSIS — R200 Anesthesia of skin: Secondary | ICD-10-CM

## 2022-11-25 DIAGNOSIS — M5412 Radiculopathy, cervical region: Secondary | ICD-10-CM

## 2022-11-25 DIAGNOSIS — M542 Cervicalgia: Secondary | ICD-10-CM

## 2022-11-25 LAB — BASIC METABOLIC PANEL
Anion gap: 6 (ref 5–15)
BUN: 13 mg/dL (ref 6–20)
CO2: 25 mmol/L (ref 22–32)
Calcium: 8.5 mg/dL — ABNORMAL LOW (ref 8.9–10.3)
Chloride: 106 mmol/L (ref 98–111)
Creatinine, Ser: 0.99 mg/dL (ref 0.44–1.00)
GFR, Estimated: >60 mL/min (ref >60)
Glucose, Bld: 104 mg/dL — ABNORMAL HIGH (ref 70–99)
Potassium: 3.4 mmol/L — ABNORMAL LOW (ref 3.5–5.1)
Sodium: 137 mmol/L (ref 135–145)

## 2022-11-25 LAB — CBC
HCT: 35.4 % — ABNORMAL LOW (ref 36.0–46.0)
Hemoglobin: 11.5 g/dL — ABNORMAL LOW (ref 12.0–15.0)
MCH: 28 pg (ref 26.0–34.0)
MCHC: 32.5 g/dL (ref 30.0–36.0)
MCV: 86.3 fL (ref 80.0–100.0)
Platelets: 336 10*3/uL (ref 150–400)
RBC: 4.1 MIL/uL (ref 3.87–5.11)
RDW: 13.7 % (ref 11.5–15.5)
WBC: 7.4 10*3/uL (ref 4.0–10.5)
nRBC: 0 % (ref 0.0–0.2)

## 2022-11-25 LAB — TROPONIN I (HIGH SENSITIVITY)
Troponin I (High Sensitivity): <2 ng/L (ref <18)
Troponin I (High Sensitivity): <2 ng/L (ref <18)

## 2022-11-25 MED ORDER — HYDROMORPHONE HCL 1 MG/ML IJ SOLN
1.0000 mg | Freq: Once | INTRAMUSCULAR | Status: AC
  Administered 2022-11-25: 1 mg via INTRAVENOUS
  Filled 2022-11-25: qty 1

## 2022-11-25 MED ORDER — OXYCODONE-ACETAMINOPHEN 5-325 MG PO TABS: 1.0000 | ORAL_TABLET | Freq: Four times a day (QID) | ORAL | 0 refills | Status: DC | PRN

## 2022-11-25 MED ORDER — KETOROLAC TROMETHAMINE 30 MG/ML IJ SOLN
30.0000 mg | Freq: Once | INTRAMUSCULAR | Status: AC
  Administered 2022-11-25: 30 mg via INTRAVENOUS
  Filled 2022-11-25: qty 1

## 2022-11-25 NOTE — Discharge Instructions (Addendum)
You were seen in the ER for arm pain and tingling. Your MRIs were performed recently and repeat MRIs were not advised at this time. Continue to manage symptoms as best as possible with NSAIDs, steroids, and pain medications. Please follow up with neurosurgery for further evaluation.

## 2022-11-25 NOTE — Progress Notes (Signed)
 Internal Medicine Clinic Attending  Case discussed with the resident at the time of the visit.  We reviewed the resident's history and exam and pertinent patient test results.  I agree with the assessment, diagnosis, and plan of care documented in the resident's note.  

## 2022-11-25 NOTE — ED Notes (Signed)
11/25/22 0930 pt called requesting another day off due to pain and having to take narcotics. Note provided.

## 2022-11-25 NOTE — ED Provider Notes (Signed)
EMERGENCY DEPARTMENT AT [facility] Provider Note   CSN: [id number] Arrival date & time: 11/25/22  1712     History Chief Complaint  Patient presents with   Arm Pain    Meagan Dean is a 52 y.o. female.  Patient presents to the emergency department complaints of left shoulder pain.  Reports that the pain is radiating from her neck into the left arm.  Was seen in the emergency department on 11/22/2022.  During that visit, patient had a very thorough workup performed including MRI imaging of cervical spine and brain.  Patient was diagnosed with cervical radiculopathy with a neurosurgery referral made the patient has not been able to see neurosurgery up at this point.  Reports that the medication that she is prescribed has not been able to control her pains adequately at home.  He is concerned that her quality life is being greatly impacted at this point and is unable to function in her normal routine capacity.   Arm Pain       Home Medications Prior to Admission medications   Medication Sig Start Date End Date Taking? Authorizing Provider  acetaminophen (TYLENOL) 500 MG tablet Take 2 tablets (1,000 mg total) by mouth every 6 (six) hours. 11/21/22   Masters, Katie, DO  albuterol (VENTOLIN HFA) 108 (90 Base) MCG/ACT inhaler Inhale 2 puffs into the lungs. 08/02/22   [provider]  gabapentin (NEURONTIN) 600 MG tablet Take 0.5 tablets (300 mg total) by mouth 3 (three) times daily. 11/22/22   Tegeler, Canary Brim, MD  ipratropium-albuterol (DUONEB) 0.5-2.5 (3) MG/3ML SOLN Take 3 mLs by nebulization every 6 (six) hours as needed. 08/09/22   Netta Corrigan, PA-C  loratadine (CLARITIN) 10 MG tablet Take 10 mg by mouth daily.    [provider]  losartan (COZAAR) 25 MG tablet Take 1 tablet (25 mg total) by mouth daily. 05/16/22 05/16/23  Adron Bene, MD  methylPREDNISolone (MEDROL DOSEPAK) 4 MG TBPK tablet Please follow directions on Dosepak. 11/22/22    Tegeler, Canary Brim, MD  ondansetron (ZOFRAN-ODT) 4 MG disintegrating tablet Take 1 tablet (4 mg total) by mouth every 8 (eight) hours as needed for nausea or vomiting. 11/22/22   Tegeler, Canary Brim, MD  oxyCODONE-acetaminophen (PERCOCET/ROXICET) 5-325 MG tablet Take 1 tablet by mouth every 4 (four) hours as needed for severe pain. 11/22/22   Tegeler, Canary Brim, MD  potassium chloride SA (KLOR-CON M20) 20 MEQ tablet Take 1 tablet (20 mEq total) by mouth daily. 10/17/22   Morene Crocker, MD  Probiotic Product (PROBIOTIC PO) Take 1 capsule by mouth daily.    [provider]  spironolactone (ALDACTONE) 25 MG tablet Take 1 tablet (25 mg total) by mouth daily. 10/04/22 01/02/23  Doran Stabler, DO      Allergies    Latex, Reglan [metoclopramide], and Vicodin [hydrocodone-acetaminophen]    Review of Systems   Review of Systems  Musculoskeletal:  Positive for myalgias and neck pain.  Neurological:  Positive for weakness.  All other systems reviewed and are negative.   Physical Exam Updated Vital Signs BP 137/85   Pulse 70   Temp 98.5 F (36.9 C) (Oral)   Resp 15   Ht 5\' 2"  (1.575 m)   Wt 73 kg   LMP  (LMP Unknown) Comment: 07/2016  SpO2 100%   BMI 29.44 kg/m  Physical Exam Vitals and nursing note reviewed.  Constitutional:      General: She is not in acute distress.  Appearance: She is well-developed.  HENT:     Head: Normocephalic and atraumatic.  Eyes:     Conjunctiva/sclera: Conjunctivae normal.  Cardiovascular:     Rate and Rhythm: Normal rate and regular rhythm.     Heart sounds: No murmur heard. Pulmonary:     Effort: Pulmonary effort is normal. No respiratory distress.     Breath sounds: Normal breath sounds.  Abdominal:     Palpations: Abdomen is soft.     Tenderness: There is no abdominal tenderness.  Musculoskeletal:        General: No swelling.     Cervical back: Neck supple.  Skin:    General: Skin is warm and dry.     Capillary Refill:  Capillary refill takes less than 2 seconds.  Neurological:     Mental Status: She is alert.     Cranial Nerves: No cranial nerve deficit.     Sensory: No sensory deficit.     Motor: Weakness present.     Comments: Some weakness appreciated in the left upper extremity compared to right.  No evident or focal loss in sensation.  Psychiatric:        Mood and Affect: Mood normal.     ED Results / Procedures / Treatments   Labs (all labs ordered are listed, but only abnormal results are displayed) Labs Reviewed  BASIC METABOLIC PANEL - Abnormal; Notable for the following components:      Result Value   Potassium 3.4 (*)    Glucose, Bld 104 (*)    Calcium 8.5 (*)    All other components within normal limits  CBC - Abnormal; Notable for the following components:   Hemoglobin 11.5 (*)    HCT 35.4 (*)    All other components within normal limits  TROPONIN I (HIGH SENSITIVITY)  TROPONIN I (HIGH SENSITIVITY)    EKG EKG Interpretation Date/Time:  Monday November 25 2022 17:31:26 EDT Ventricular Rate:  71 PR Interval:  169 QRS Duration:  73 QT Interval:  371 QTC Calculation: 404 R Axis:   76  Text Interpretation: Sinus rhythm Consider left atrial enlargement Confirmed by Kristine Royal [id number] on 11/25/2022 8:03:00 PM  Radiology DG Chest 2 View  Result Date: 11/25/2022 CLINICAL DATA:  Left arm pain radiating down her arm EXAM: CHEST - 2 VIEW COMPARISON:  Radiographs 08/08/2022 FINDINGS: The heart size and mediastinal contours are within normal limits. Both lungs are clear. The visualized skeletal structures are unremarkable. Gastric band. IMPRESSION: No active cardiopulmonary disease. Electronically Signed   By: Minerva Fester M.D.   On: 11/25/2022 18:15    Procedures Procedures   Medications Ordered in ED Medications  HYDROmorphone (DILAUDID) injection 1 mg (has no administration in time range)    ED Course/ Medical Decision Making/ A&P                           Medical  Decision Making Amount and/or Complexity of Data Reviewed Labs: ordered. Radiology: ordered.  Risk Prescription drug management.   This patient presents to the ED for concern of arm pain.  Differential diagnosis includes ACS, MI, cervical radiculopathy, myelopathy   Lab Tests:  I Ordered, and personally interpreted labs.  The pertinent results include: CBC unremarkable, CMP with mild hypokalemia   Imaging Studies ordered:  I ordered imaging studies including chest x-ray I independently visualized and interpreted imaging which showed no acute cardiopulmonary process I agree with the radiologist interpretation   Medicines  ordered and prescription drug management:  I ordered medication including Dilaudid for pain Reevaluation of the patient after these medicines showed that the patient improved I have reviewed the patients home medicines and have made adjustments as needed   Problem List / ED Course:  Patient presented to the emergency department complaints of left arm pain.  She was seen a few days ago for similar symptoms and had a thorough workup performed at that time including MRI imaging.  Patient was diagnosed with cervical radiculopathy and advised to follow-up with neurosurgery for further evaluation and management of her symptoms.  Patient not been able to follow-up with neurosurgery up to this point but is try to reach out for scheduling. Multiple rounds of Dilaudid and Toradol were given with some improvement in symptoms. Appears that patient's symptoms are typically aggravated with positional changes at work as she is typically hunched over when she is at a computer. This may be partly to blame for difficulty getting symptoms under control. No acute indication to workup symptoms with advanced imaging again as patient received MRIs one week ago. Instead advised patient to continue managing with OTC medications, Percocet, and follow up with neurosurgery. Patient is  agreeable with treatment plan and verbalized understanding all return precautions. All questions answered prior to patient discharge.   Final Clinical Impression(s) / ED Diagnoses Final diagnoses:  None    Rx / DC Orders ED Discharge Orders     None         Smitty Knudsen, PA-C 11/27/22 1721    Wynetta Fines, MD 12/02/22 1501

## 2022-11-25 NOTE — ED Triage Notes (Signed)
Patient has had left arm pain that radiates down her arm along with chest fluttering since Friday. Feels uneven on her feet. Stated no pain medication is helping.

## 2022-11-27 ENCOUNTER — Telehealth: Payer: Self-pay | Admitting: *Deleted

## 2022-11-27 DIAGNOSIS — T148XXA Other injury of unspecified body region, initial encounter: Secondary | ICD-10-CM

## 2022-11-27 DIAGNOSIS — M5412 Radiculopathy, cervical region: Secondary | ICD-10-CM

## 2022-11-27 NOTE — Telephone Encounter (Signed)
Patient calling in to request refill on ibuprofen 800 mg and methocarbamol 1500 mg for continued pain. States she was supposed to see neurosurgeon today but he was called away for emergency surgery. She now has f/u with him on 7/19. These meds have expired off med list.

## 2022-11-28 NOTE — Telephone Encounter (Addendum)
52 year old recently seen 7/11 for pain of left shoulder and treated for MSK strain with ibuprofen and methocarbamol for 3 days. Her pain did not improve and she went to ED  She was prescribed in ED oxycodone-Acetaminophen 5-325 mg, #30 tabs in last week.   She still has 6 tablets of oxycodone-acetaminophen 5-325 mg. She completed steroid pack.   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  She saw neurosurgeon(Dr. Everardo Pacific) today, and per patient he said that there were no interventions that he would want to pursue.  She has an ortho appointment on Monday.  P: Increase gabapentin from 300 mg to 600 mg TID Tylenol 1000mg  QID Follow-up on 7/24 to [facility]

## 2022-11-29 MED ORDER — ACETAMINOPHEN 500 MG PO TABS
1000.0000 mg | ORAL_TABLET | Freq: Four times a day (QID) | ORAL | 0 refills | Status: AC
Start: 2022-11-29 — End: ?

## 2022-11-29 NOTE — Telephone Encounter (Signed)
Pt saw [facility] today.  Pt calling back to f//u with her Ibuprofen 800 mg & methocarbamol 1500 mg request.  Pt. requesting call back.

## 2022-12-04 ENCOUNTER — Encounter: Payer: Self-pay | Admitting: Internal Medicine

## 2022-12-04 ENCOUNTER — Ambulatory Visit: Payer: Commercial Managed Care - PPO | Admitting: Internal Medicine

## 2022-12-04 VITALS — BP 132/82 | HR 92 | Temp 98.6°F | Ht 62.0 in | Wt 165.0 lb

## 2022-12-04 DIAGNOSIS — M25512 Pain in left shoulder: Secondary | ICD-10-CM

## 2022-12-04 DIAGNOSIS — E876 Hypokalemia: Secondary | ICD-10-CM | POA: Diagnosis not present

## 2022-12-04 DIAGNOSIS — E237 Disorder of pituitary gland, unspecified: Secondary | ICD-10-CM | POA: Diagnosis not present

## 2022-12-04 NOTE — Patient Instructions (Addendum)
Thank you, Ms.Allison Quarry for allowing [place] to provide your care today.  Left shoulder I am checking some blood work today to check about the inflammation in your shoulder. I also ordered an imaging test specific for your shoulder.  Pituitary lesion I am checking a lab called prolactin today to see if the lesion is making any hormones. The current recommendations are to repeat MRI brain yearly for 2 years.  Hypokalemia We will recheck your potassium today.  I have ordered the following labs for you:  Lab Orders         Prolactin         BMP8+Anion Gap         Sed Rate (ESR)         CRP (C-Reactive Protein)      I have ordered the following medication/changed the following medications:   Stop the following medications: There are no discontinued medications.   Start the following medications: No orders of the defined types were placed in this encounter.    We look forward to seeing you next time. Please call our clinic at [phone] if you have any questions or concerns. The best time to call is Monday-Friday from 9am-4pm, but there is someone available 24/7. If after hours or the weekend, call the main hospital number and ask for the Internal Medicine Resident On-Call. If you need medication refills, please notify your pharmacy one week in advance and they will send [place] a request.   Thank you for trusting me with your care. Wishing you the best!   Rudene Christians, DO [facility]

## 2022-12-04 NOTE — Assessment & Plan Note (Addendum)
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 She had hysterectomy several years ago.
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
## 2022-12-05 ENCOUNTER — Encounter: Payer: Self-pay | Admitting: Internal Medicine

## 2022-12-05 DIAGNOSIS — M25512 Pain in left shoulder: Secondary | ICD-10-CM | POA: Insufficient documentation

## 2022-12-05 LAB — BMP8+ANION GAP
Anion Gap: 16 mmol/L (ref 10.0–18.0)
BUN/Creatinine Ratio: 19 (ref 9–23)
BUN: 17 mg/dL (ref 6–24)
CO2: 21 mmol/L (ref 20–29)
Creatinine, Ser: 0.88 mg/dL (ref 0.57–1.00)
Glucose: 75 mg/dL (ref 70–99)
Potassium: 3.4 mmol/L — ABNORMAL LOW (ref 3.5–5.2)
Sodium: 141 mmol/L (ref 134–144)
eGFR: 80 mL/min/{1.73_m2} (ref >59)

## 2022-12-05 LAB — PROLACTIN: Prolactin: 12.9 ng/mL (ref 3.6–25.2)

## 2022-12-05 LAB — SEDIMENTATION RATE: Sed Rate: 14 mm/hr (ref 0–40)

## 2022-12-05 LAB — C-REACTIVE PROTEIN: CRP: 5 mg/L (ref 0–10)

## 2022-12-05 NOTE — Assessment & Plan Note (Addendum)
Pain has been present for last 3-4 weeks. She went to ED when numbness and tingling began in hand. With her brother's history of MS and new weakness, MRI brain and cervical completed. No demyelinating lesions seen on MRI. She did have some foraminal stenosis with disc degeneration. No signs of nerve impingement. She was seen by ortho and received toradol and steroid injection. They also started steroid dose pack and meloxicam. She plans to follow-up with them in 4 weeks. On exam she has swelling but no bruising present over left rhomboid and latissimus dorsi. TTP to light touch of this area. Range of motion of shoulder is restricted due to pain. P: Her pain does not appear localized to shoulder but rather over rhomboid and lat dorsi. Her swelling is out of proportion to be 2/2 to muscle strain. With concern for myositis, inflammatory markers were drawn. ESR within normal limits. Will proceed with CT shoulder with contrast. Patient asked to stop steroid dose pack. Continue gabapentin 600 mg every day CRP pending

## 2022-12-05 NOTE — Assessment & Plan Note (Addendum)
Chronic history of hypokalemia. Prior aldo/renin negative. No metabolic derangements concerning for RTA or Little syndrome. She has not been taking potassium supplement for last few days as she was distracted by pain.  K at 3.4 on BMP P: Restart kclor 20 mEq

## 2022-12-07 NOTE — Progress Notes (Signed)
Internal Medicine Clinic Attending  I was physically present during the key portions of the resident provided service and participated in the medical decision making of patient's management care. I reviewed pertinent patient test results.  The assessment, diagnosis, and plan were formulated together and I agree with the documentation in the resident's note. Interesting presentation.  Definite swelling is both visible and palpable, with associated exquisite tenderness, in setting of feeling generally unwell.  Myositis comes to mind though this would be unusual.  The presentation is out of proportion to a simple muscle strain, and there has been no instigating trauma or load on muscles.    Miguel Aschoff, MD

## 2022-12-14 ENCOUNTER — Other Ambulatory Visit: Payer: Self-pay

## 2022-12-14 ENCOUNTER — Encounter (HOSPITAL_COMMUNITY): Payer: Self-pay

## 2022-12-14 ENCOUNTER — Emergency Department (HOSPITAL_COMMUNITY): Payer: Commercial Managed Care - PPO

## 2022-12-14 ENCOUNTER — Emergency Department (HOSPITAL_COMMUNITY)
Admission: EM | Admit: 2022-12-14 | Discharge: 2022-12-14 | Disposition: A | Discharge status: Home / Self Care | Payer: Commercial Managed Care - PPO | Attending: Emergency Medicine | Admitting: Emergency Medicine

## 2022-12-14 DIAGNOSIS — Z79899 Other long term (current) drug therapy: Secondary | ICD-10-CM | POA: Insufficient documentation

## 2022-12-14 DIAGNOSIS — I10 Essential (primary) hypertension: Secondary | ICD-10-CM | POA: Insufficient documentation

## 2022-12-14 DIAGNOSIS — Z91040 Latex allergy status: Secondary | ICD-10-CM | POA: Diagnosis not present

## 2022-12-14 DIAGNOSIS — E876 Hypokalemia: Secondary | ICD-10-CM | POA: Insufficient documentation

## 2022-12-14 DIAGNOSIS — D649 Anemia, unspecified: Secondary | ICD-10-CM | POA: Diagnosis not present

## 2022-12-14 DIAGNOSIS — M25512 Pain in left shoulder: Secondary | ICD-10-CM | POA: Diagnosis present

## 2022-12-14 DIAGNOSIS — G8929 Other chronic pain: Secondary | ICD-10-CM | POA: Insufficient documentation

## 2022-12-14 DIAGNOSIS — M19012 Primary osteoarthritis, left shoulder: Secondary | ICD-10-CM | POA: Insufficient documentation

## 2022-12-14 LAB — MAGNESIUM: Magnesium: 2 mg/dL (ref 1.7–2.4)

## 2022-12-14 LAB — CBC WITH DIFFERENTIAL/PLATELET
Abs Immature Granulocytes: 0.01 10*3/uL (ref 0.00–0.07)
Basophils Absolute: 0 10*3/uL (ref 0.0–0.1)
Basophils Relative: 0 %
Eosinophils Absolute: 0.1 10*3/uL (ref 0.0–0.5)
Eosinophils Relative: 1 %
HCT: 31.8 % — ABNORMAL LOW (ref 36.0–46.0)
Hemoglobin: 10.4 g/dL — ABNORMAL LOW (ref 12.0–15.0)
Immature Granulocytes: 0 %
Lymphocytes Relative: 23 %
Lymphs Abs: 1.8 10*3/uL (ref 0.7–4.0)
MCH: 27.9 pg (ref 26.0–34.0)
MCHC: 32.7 g/dL (ref 30.0–36.0)
MCV: 85.3 fL (ref 80.0–100.0)
Monocytes Absolute: 0.5 10*3/uL (ref 0.1–1.0)
Monocytes Relative: 6 %
Neutro Abs: 5.4 10*3/uL (ref 1.7–7.7)
Neutrophils Relative %: 70 %
Platelets: 379 10*3/uL (ref 150–400)
RBC: 3.73 MIL/uL — ABNORMAL LOW (ref 3.87–5.11)
RDW: 14.5 % (ref 11.5–15.5)
WBC: 7.7 10*3/uL (ref 4.0–10.5)
nRBC: 0 % (ref 0.0–0.2)

## 2022-12-14 LAB — BASIC METABOLIC PANEL
Anion gap: 9 (ref 5–15)
BUN: 17 mg/dL (ref 6–20)
CO2: 23 mmol/L (ref 22–32)
Calcium: 8.5 mg/dL — ABNORMAL LOW (ref 8.9–10.3)
Chloride: 106 mmol/L (ref 98–111)
Creatinine, Ser: 0.75 mg/dL (ref 0.44–1.00)
GFR, Estimated: >60 mL/min (ref >60)
Glucose, Bld: 103 mg/dL — ABNORMAL HIGH (ref 70–99)
Potassium: 2.6 mmol/L — CL (ref 3.5–5.1)
Sodium: 138 mmol/L (ref 135–145)

## 2022-12-14 MED ORDER — LIDOCAINE 5 % EX PTCH
1.0000 | MEDICATED_PATCH | CUTANEOUS | Status: DC
  Administered 2022-12-14: 1 via TRANSDERMAL
  Filled 2022-12-14: qty 1

## 2022-12-14 MED ORDER — IOHEXOL 300 MG/ML  SOLN
100.0000 mL | Freq: Once | INTRAMUSCULAR | Status: AC | PRN
  Administered 2022-12-14: 100 mL via INTRAVENOUS

## 2022-12-14 MED ORDER — HYDROMORPHONE HCL 1 MG/ML IJ SOLN
1.0000 mg | Freq: Once | INTRAMUSCULAR | Status: AC
  Administered 2022-12-14: 1 mg via INTRAVENOUS
  Filled 2022-12-14: qty 1

## 2022-12-14 MED ORDER — POTASSIUM CHLORIDE 10 MEQ/100ML IV SOLN
10.0000 meq | INTRAVENOUS | Status: AC
  Administered 2022-12-14 (×3): 10 meq via INTRAVENOUS
  Filled 2022-12-14 (×3): qty 100

## 2022-12-14 MED ORDER — POTASSIUM CHLORIDE CRYS ER 20 MEQ PO TBCR
40.0000 meq | EXTENDED_RELEASE_TABLET | Freq: Once | ORAL | Status: AC
  Administered 2022-12-14: 40 meq via ORAL
  Filled 2022-12-14: qty 2

## 2022-12-14 NOTE — ED Notes (Signed)
Critical value called in by the lab. Patient's Potassium is 2.6. Provider was notified by me

## 2022-12-14 NOTE — ED Notes (Signed)
Patient transported to CT at this time. 

## 2022-12-14 NOTE — ED Triage Notes (Signed)
Pt coming in for left arm pain. Pt describes it as pain in the shoulder that radiates up her neck and down her arm with numbness in her fingers. Seen several times recently without relief. Pain has only increased since last visit. Decreased sensation and strength in the left arm, but no where else. No other stroke s/s.

## 2022-12-14 NOTE — Discharge Instructions (Addendum)
It was a pleasure caring for you today. Your labs showed low potassium levels which we have provided you supplementation for in the ED. You will need to follow up with your orthopedic doctor. Seek emergency care if experiencing any new or worsening symptoms.  Alternating between 650 mg Tylenol and 400 mg Advil: The best way to alternate taking Acetaminophen (example Tylenol) and Ibuprofen (example Advil/Motrin) is to take them 3 hours apart. For example, if you take ibuprofen at 6 am you can then take Tylenol at 9 am. You can continue this regimen throughout the day, making sure you do not exceed the recommended maximum dose for each drug.

## 2022-12-14 NOTE — ED Provider Notes (Signed)
[place] EMERGENCY DEPARTMENT AT [facility] Provider Note   CSN: [id number] Arrival date & time: 12/14/22  1457     History  Chief Complaint  Patient presents with   Arm pain    Meagan Dean is a 52 y.o. female with PMHx HTN, GERD, chronic left shoulder pain who presents to ED concerned for left shoulder pain radiating from her neck into the left arm. Patient seen multiple times in the ED over the past 2 months for same complaint. Patient received MRI of cervical spine and brain and was diagnosed with cervical radiculopathy and referred to neurosurgery. Patient stating that she did not like the neurosurgereon she was referred to. Patient also following with Orthopedics. Patient states that the only medications that help is [facility] Goody powder and Gabapentin. PCP ordered CT shoulder 2 weeks ago but the appointment has not yet been scheduled. Patient requesting this CT scan today.  No neck trauma. No fever, chest pain, cough, dyspnea, vomiting.  HPI     Home Medications Prior to Admission medications   Medication Sig Start Date End Date Taking? Authorizing Provider  acetaminophen (TYLENOL) 500 MG tablet Take 2 tablets (1,000 mg total) by mouth every 6 (six) hours. 11/29/22   Masters, Katie, DO  albuterol (VENTOLIN HFA) 108 (90 Base) MCG/ACT inhaler Inhale 2 puffs into the lungs. 08/02/22   [provider]  gabapentin (NEURONTIN) 600 MG tablet Take 0.5 tablets (300 mg total) by mouth 3 (three) times daily. 11/22/22   Tegeler, Canary Brim, MD  ipratropium-albuterol (DUONEB) 0.5-2.5 (3) MG/3ML SOLN Take 3 mLs by nebulization every 6 (six) hours as needed. 08/09/22   Netta Corrigan, PA-C  loratadine (CLARITIN) 10 MG tablet Take 10 mg by mouth daily.    [provider]  losartan (COZAAR) 25 MG tablet Take 1 tablet (25 mg total) by mouth daily. 05/16/22 05/16/23  Adron Bene, MD  meloxicam (MOBIC) 15 MG tablet Take 15 mg by mouth daily. 12/02/22   [provider]  ondansetron (ZOFRAN-ODT) 4 MG disintegrating tablet Take 1 tablet (4 mg total) by mouth every 8 (eight) hours as needed for nausea or vomiting. 11/22/22   Tegeler, Canary Brim, MD  potassium chloride SA (KLOR-CON M20) 20 MEQ tablet Take 1 tablet (20 mEq total) by mouth daily. 10/17/22   Morene Crocker, MD  Probiotic Product (PROBIOTIC PO) Take 1 capsule by mouth daily.    [provider]  spironolactone (ALDACTONE) 25 MG tablet Take 1 tablet (25 mg total) by mouth daily. 10/04/22 01/02/23  Doran Stabler, DO      Allergies    Latex, Reglan [metoclopramide], and Vicodin [hydrocodone-acetaminophen]    Review of Systems   Review of Systems  Musculoskeletal:        Shoulder pain    Physical Exam Updated Vital Signs BP 127/84   Pulse 81   Temp 98 F (36.7 C) (Oral)   Resp 19   Ht 5\' 2"  (1.575 m)   Wt 74.8 kg   LMP  (LMP Unknown) Comment: 07/2016  SpO2 97%   BMI 30.18 kg/m  Physical Exam Vitals and nursing note reviewed.  Constitutional:      General: She is not in acute distress. HENT:     Head: Normocephalic and atraumatic.     Mouth/Throat:     Mouth: Mucous membranes are moist.  Eyes:     General: No scleral icterus.       Right eye: No discharge.  Left eye: No discharge.     Conjunctiva/sclera: Conjunctivae normal.  Cardiovascular:     Rate and Rhythm: Normal rate and regular rhythm.     Pulses: Normal pulses.     Heart sounds: Normal heart sounds. No murmur heard. Pulmonary:     Effort: Pulmonary effort is normal. No respiratory distress.     Breath sounds: Normal breath sounds. No wheezing, rhonchi or rales.  Abdominal:     General: Abdomen is flat.  Musculoskeletal:     Right lower leg: No edema.     Left lower leg: No edema.     Comments: Tenderness to palpation of left posterior shoulder muscles. No swelling, erythema, or rashes. +2 radial pulses bilaterally. Sensation to light touch intact of UE. Patient endorsing subjective  "shocking" while touching left arm/fingers.  Skin:    General: Skin is warm and dry.     Capillary Refill: Capillary refill takes less than 2 seconds.     Findings: No rash.  Neurological:     General: No focal deficit present.     Mental Status: She is alert. Mental status is at baseline.     Sensory: No sensory deficit.  Psychiatric:        Mood and Affect: Mood normal.        Behavior: Behavior normal.     ED Results / Procedures / Treatments   Labs (all labs ordered are listed, but only abnormal results are displayed) Labs Reviewed  CBC WITH DIFFERENTIAL/PLATELET - Abnormal; Notable for the following components:      Result Value   RBC 3.73 (*)    Hemoglobin 10.4 (*)    HCT 31.8 (*)    All other components within normal limits  BASIC METABOLIC PANEL - Abnormal; Notable for the following components:   Potassium 2.6 (*)    Glucose, Bld 103 (*)    Calcium 8.5 (*)    All other components within normal limits  MAGNESIUM    EKG None  Radiology CT SHOULDER LEFT W CONTRAST  Result Date: 12/14/2022 CLINICAL DATA:  Shoulder pain, chronic, erosive osteoarthritis suspected, xray done EXAM: CT OF THE UPPER LEFT EXTREMITY WITH CONTRAST TECHNIQUE: Multidetector CT imaging of the upper left extremity was performed according to the standard protocol following intravenous contrast administration. RADIATION DOSE REDUCTION: This exam was performed according to the departmental dose-optimization program which includes automated exposure control, adjustment of the mA and/or kV according to patient size and/or use of iterative reconstruction technique. CONTRAST:  OMNIPAQUE IOHEXOL 300 MG/ML  SOLN COMPARISON:  None Available.  No radiographs available. FINDINGS: Bones/Joint/Cartilage The glenohumeral joint space is preserved. There is minor osteoarthritis of the acromioclavicular joint with minor spurring and subchondral cysts. No fracture. No erosion, evidence of avascular necrosis or focal  bone lesion. The scapula, included humerus and clavicle are intact. No glenohumeral effusion. Ligaments Suboptimally assessed by CT. Muscles and Tendons Rotator cuff muscle bulk is maintained. Rotator cuff tendon suboptimally assessed. Soft tissues No soft tissue collection or mass. No axillary adenopathy. The included left lung is clear. IMPRESSION: 1. Minor osteoarthritis of the acromioclavicular joint. 2. No evidence of inflammatory arthropathy.  No acute findings. Electronically Signed   By: Narda Rutherford M.D.   On: 12/14/2022 17:58    Procedures Procedures    Medications Ordered in ED Medications  potassium chloride 10 mEq in 100 mL IVPB (10 mEq Intravenous New Bag/Given 12/14/22 1802)  HYDROmorphone (DILAUDID) injection 1 mg (has no administration in time range)  lidocaine (LIDODERM) 5 % 1 patch (has no administration in time range)  HYDROmorphone (DILAUDID) injection 1 mg (1 mg Intravenous Given 12/14/22 1626)  potassium chloride SA (KLOR-CON M) CR tablet 40 mEq (40 mEq Oral Given 12/14/22 1803)  iohexol (OMNIPAQUE) 300 MG/ML solution 100 mL (100 mLs Intravenous Contrast Given 12/14/22 1735)    ED Course/ Medical Decision Making/ A&P                                 Medical Decision Making  This patient presents to the ED for concern of back/shoulder pain, this involves an extensive number of treatment options, and is a complaint that carries with it a high risk of complications and morbidity.  The differential diagnosis includes spinal abscess, osteomyelitis, herniated disc, muscle strain, spinal fracture, meningitis, cancer, cauda equina syndrome, hemarthrosis, gout, septic joint, compartment syndrome .   Co morbidities that complicate the patient evaluation  HTN, GERD, chronic neck pain   Additional history obtained:  Additional history obtained from MRI 7/12 1. Intermittently motion degraded examination. 2. Within the limitations of motion degradation, no signal abnormality is  identified within the cervical spinal cord. No pathologic spinal cord enhancement. 3. Cervical spondylosis as outlined. No more than mild spinal canal narrowing. Multilevel foraminal stenosis, as detailed and greatest on the right at C5-C6 (moderate at this site). Disc degeneration is greatest at C4-C5 and C5-C6 (moderate at these levels). 4. Nonspecific reversal of the expected cervical lordosis.   Lab Tests:  I Ordered, and personally interpreted labs.  The pertinent results include:   - BMP: hypokalemia - CBC: mild anemia, no leukocytosis  - Mag: within normal limits   Imaging Studies ordered:  I ordered imaging studies including  - CT shoulder: Assess for process contributing patient's symptoms I independently visualized and interpreted imaging  I agree with the radiologist interpretation    Problem List / ED Course / Critical interventions / Medication management  Patient presents emergency room concern for chronic left shoulder pain.  Patient specifically requesting CT imaging that was ordered by her primary care provider but not yet scheduled.  Patient denying any new trauma.  Patient stating that she feels like her fingers are numb.  On physical exam patient had sensation to light touch intact of her left arm and hand.  +2 radial pulses bilaterally.  Tenderness to palpation of posterior shoulder muscles.  No erythema, swelling, or rashes/skin changes. CBC with mild anemia and no leukocytosis.  BMP with hypokalemia.  Provided patient with IV potassium as well as oral potassium.  EKG without inverted T waves or U waves.  Patient stating that she has had hypokalemia in the past.  Recommended patient follow-up with her primary care provider which she endorsed understanding of plan. CT without acute findings -some signs of arthritis of acromioclavicular joint which I believe is exacerbating her shoulder pain. Patient stating that pain has resolved with medicine emergency room. Will  proceed with conservative therapy and have patient follow up with PCP.  Patient also endorses following up with orthopedic medicine.  Educated patient that she might need physical therapy to be referred by her orthopedic doctor. I have reviewed the patients home medicines and have made adjustments as needed Patient afebrile with stable vitals. Patient ready for discharge. Provided patient with  return precaution.  Ddx: these are considered less likely due to history of present illness and physical exam -spinal abscess: no fever, no  skin findings, no history of IVDU -osteomyelitis: no history of IVDU; pain is acute -spinal fracture: not a high force trauma associated with pain -meningitis: no fever; lack of meningism symptoms -cancer: symptoms are acute -cauda equina syndrome: denies saddle paresthesia, urinary retention, fecal incontinence -hemarthrosis: joint without swelling; ROM intact -gout: no warmth or erythema; ROM intact  -septic joint: afebrile; no warmth or erythema; no skin changes; ROM intact  -fracture: xray without concern  -compartment syndrome: area not tense; neurovascularly intact   Social Determinants of Health:  none   .sfmj         Final Clinical Impression(s) / ED Diagnoses Final diagnoses:  Chronic left shoulder pain  Hypokalemia  Arthritis of left acromioclavicular joint    Rx / DC Orders ED Discharge Orders     None         Dorthy Cooler, [place] 12/14/22 1853    Charlynne Pander, MD 12/14/22 2217

## 2022-12-23 ENCOUNTER — Telehealth: Payer: Self-pay | Admitting: *Deleted

## 2022-12-23 NOTE — Telephone Encounter (Signed)
Spoke with patient she is aware of this appointment. She was just getting off the phone with their office.

## 2022-12-23 NOTE — Telephone Encounter (Signed)
Call from patient has had blood in her Urine x 3 weeks.  No abdominal pain or pain on Urination.  Advised to go to an Urgent Care or the ER for follow up.

## 2022-12-24 ENCOUNTER — Ambulatory Visit (HOSPITAL_BASED_OUTPATIENT_CLINIC_OR_DEPARTMENT_OTHER): Admission: RE | Admit: 2022-12-24 | Payer: Commercial Managed Care - PPO | Source: Ambulatory Visit

## 2022-12-30 ENCOUNTER — Other Ambulatory Visit: Payer: Self-pay

## 2022-12-30 ENCOUNTER — Emergency Department (HOSPITAL_BASED_OUTPATIENT_CLINIC_OR_DEPARTMENT_OTHER)
Admission: EM | Admit: 2022-12-30 | Discharge: 2022-12-30 | Disposition: A | Discharge status: Home / Self Care | Payer: Commercial Managed Care - PPO | Attending: Emergency Medicine | Admitting: Emergency Medicine

## 2022-12-30 ENCOUNTER — Emergency Department (HOSPITAL_BASED_OUTPATIENT_CLINIC_OR_DEPARTMENT_OTHER): Payer: Commercial Managed Care - PPO

## 2022-12-30 ENCOUNTER — Telehealth: Payer: Self-pay

## 2022-12-30 DIAGNOSIS — R319 Hematuria, unspecified: Secondary | ICD-10-CM | POA: Insufficient documentation

## 2022-12-30 DIAGNOSIS — R1030 Lower abdominal pain, unspecified: Secondary | ICD-10-CM | POA: Diagnosis not present

## 2022-12-30 DIAGNOSIS — Z91040 Latex allergy status: Secondary | ICD-10-CM | POA: Insufficient documentation

## 2022-12-30 LAB — URINALYSIS, MICROSCOPIC (REFLEX)
Bacteria, UA: NONE SEEN
RBC / HPF: >50 RBC/hpf (ref 0–5)

## 2022-12-30 LAB — URINALYSIS, ROUTINE W REFLEX MICROSCOPIC
Bilirubin Urine: NEGATIVE
Glucose, UA: NEGATIVE mg/dL
Ketones, ur: NEGATIVE mg/dL
Leukocytes,Ua: NEGATIVE
Nitrite: NEGATIVE
Protein, ur: NEGATIVE mg/dL
Specific Gravity, Urine: <1.005 — ABNORMAL LOW (ref 1.005–1.030)
pH: 6 (ref 5.0–8.0)

## 2022-12-30 LAB — BASIC METABOLIC PANEL
Anion gap: 10 (ref 5–15)
BUN: 14 mg/dL (ref 6–20)
CO2: 25 mmol/L (ref 22–32)
Calcium: 9.1 mg/dL (ref 8.9–10.3)
Chloride: 104 mmol/L (ref 98–111)
Creatinine, Ser: 0.73 mg/dL (ref 0.44–1.00)
GFR, Estimated: >60 mL/min (ref >60)
Glucose, Bld: 87 mg/dL (ref 70–99)
Potassium: 2.7 mmol/L — CL (ref 3.5–5.1)
Sodium: 139 mmol/L (ref 135–145)

## 2022-12-30 LAB — CBC
HCT: 35.1 % — ABNORMAL LOW (ref 36.0–46.0)
Hemoglobin: 11.6 g/dL — ABNORMAL LOW (ref 12.0–15.0)
MCH: 28.4 pg (ref 26.0–34.0)
MCHC: 33 g/dL (ref 30.0–36.0)
MCV: 86 fL (ref 80.0–100.0)
Platelets: 359 10*3/uL (ref 150–400)
RBC: 4.08 MIL/uL (ref 3.87–5.11)
RDW: 14.4 % (ref 11.5–15.5)
WBC: 7 10*3/uL (ref 4.0–10.5)
nRBC: 0 % (ref 0.0–0.2)

## 2022-12-30 MED ORDER — ONDANSETRON 4 MG PO TBDP
4.0000 mg | ORAL_TABLET | Freq: Once | ORAL | Status: AC
  Administered 2022-12-30: 4 mg via ORAL
  Filled 2022-12-30: qty 1

## 2022-12-30 MED ORDER — IOHEXOL 300 MG/ML  SOLN
100.0000 mL | Freq: Once | INTRAMUSCULAR | Status: AC | PRN
  Administered 2022-12-30: 85 mL via INTRAVENOUS

## 2022-12-30 MED ORDER — SODIUM CHLORIDE 0.9 % IV BOLUS
1000.0000 mL | Freq: Once | INTRAVENOUS | Status: AC
  Administered 2022-12-30: 1000 mL via INTRAVENOUS

## 2022-12-30 MED ORDER — POTASSIUM CHLORIDE CRYS ER 20 MEQ PO TBCR
40.0000 meq | EXTENDED_RELEASE_TABLET | Freq: Once | ORAL | Status: AC
  Administered 2022-12-30: 40 meq via ORAL
  Filled 2022-12-30: qty 2

## 2022-12-30 NOTE — Telephone Encounter (Signed)
Pt called stated that she was  seeing blood in her urine  for the past few wks ...  Stated that it was 3wks but for the past 2 wks it has gotten worse reports  fever chills  dizzy  and hot flashes ...  I give the call to PCP  who was in the office and free at the time .Marland KitchenMarland Kitchen

## 2022-12-30 NOTE — ED Triage Notes (Signed)
Pt to ED c/o hematuria x 3 weeks. Reports pain with urination. HX: Hysterectomy.

## 2022-12-30 NOTE — Discharge Instructions (Addendum)
You were seen in the ER today for blood in your urine.   Call urology and schedule an appointment to follow-up for blood in your urine.  Follow-up with your primary care doctor on Wednesday as discussed to go over findings from todays visit.   Call and follow-up with the doctors office that performed your gastric band surgery due to finding on CT scan, suggesting possible slipped gastric band.

## 2022-12-30 NOTE — ED Provider Notes (Signed)
[place] EMERGENCY DEPARTMENT AT [facility] Provider Note   CSN: [id number] Arrival date & time: 12/30/22  1205     History  Chief Complaint  Patient presents with   Hematuria    Meagan Dean is a 52 y.o. female presenting for 4 weeks of hematuria and bright red blood when she wipes.  She has associated nausea fatigue and dizziness.  Denies any suprapubic pain, reports mild burning when she pees. She reports taking blood pressure medication including a potassium pill.  She has had problems with low potassium in the past.  She has a history of hysterectomy due to endometriosis and fibroids.   Hematuria       Home Medications Prior to Admission medications   Medication Sig Start Date End Date Taking? Authorizing Provider  acetaminophen (TYLENOL) 500 MG tablet Take 2 tablets (1,000 mg total) by mouth every 6 (six) hours. 11/29/22   Masters, Katie, DO  albuterol (VENTOLIN HFA) 108 (90 Base) MCG/ACT inhaler Inhale 2 puffs into the lungs. 08/02/22   [provider]  gabapentin (NEURONTIN) 600 MG tablet Take 0.5 tablets (300 mg total) by mouth 3 (three) times daily. 11/22/22   Tegeler, Canary Brim, MD  ipratropium-albuterol (DUONEB) 0.5-2.5 (3) MG/3ML SOLN Take 3 mLs by nebulization every 6 (six) hours as needed. 08/09/22   Netta Corrigan, PA-C  loratadine (CLARITIN) 10 MG tablet Take 10 mg by mouth daily.    [provider]  losartan (COZAAR) 25 MG tablet Take 1 tablet (25 mg total) by mouth daily. 05/16/22 05/16/23  Adron Bene, MD  meloxicam (MOBIC) 15 MG tablet Take 15 mg by mouth daily. 12/02/22   [provider]  ondansetron (ZOFRAN-ODT) 4 MG disintegrating tablet Take 1 tablet (4 mg total) by mouth every 8 (eight) hours as needed for nausea or vomiting. 11/22/22   Tegeler, Canary Brim, MD  potassium chloride SA (KLOR-CON M20) 20 MEQ tablet Take 1 tablet (20 mEq total) by mouth daily. 10/17/22   Morene Crocker, MD  Probiotic Product  (PROBIOTIC PO) Take 1 capsule by mouth daily.    [provider]  spironolactone (ALDACTONE) 25 MG tablet Take 1 tablet (25 mg total) by mouth daily. 10/04/22 01/02/23  Doran Stabler, DO      Allergies    Latex, Reglan [metoclopramide], and Vicodin [hydrocodone-acetaminophen]    Review of Systems   Review of Systems  Genitourinary:  Positive for hematuria.    Physical Exam Updated Vital Signs BP (!) 128/98   Pulse 80   Temp 98.4 F (36.9 C)   Resp 16   Ht 5\' 2"  (1.575 m)   Wt 74.8 kg   LMP  (LMP Unknown) Comment: 07/2016  SpO2 100%   BMI 30.18 kg/m  Physical Exam Vitals and nursing note reviewed.  Constitutional:      General: She is not in acute distress.    Appearance: She is not toxic-appearing.  HENT:     Head: Normocephalic and atraumatic.  Eyes:     General: No scleral icterus.    Conjunctiva/sclera: Conjunctivae normal.  Cardiovascular:     Rate and Rhythm: Normal rate and regular rhythm.     Pulses: Normal pulses.     Heart sounds: Normal heart sounds.  Pulmonary:     Effort: Pulmonary effort is normal. No respiratory distress.     Breath sounds: Normal breath sounds.  Abdominal:     General: Abdomen is flat. Bowel sounds are normal.     Palpations: Abdomen is  soft.     Tenderness: There is no abdominal tenderness.     Comments: Suprapubic pain w/ palpation   Skin:    General: Skin is warm and dry.     Findings: No lesion.  Neurological:     General: No focal deficit present.     Mental Status: She is alert and oriented to person, place, and time. Mental status is at baseline.     ED Results / Procedures / Treatments   Labs (all labs ordered are listed, but only abnormal results are displayed) Labs Reviewed  BASIC METABOLIC PANEL - Abnormal; Notable for the following components:      Result Value   Potassium 2.7 (*)    All other components within normal limits  CBC - Abnormal; Notable for the following components:   Hemoglobin 11.6 (*)     HCT 35.1 (*)    All other components within normal limits  URINALYSIS, ROUTINE W REFLEX MICROSCOPIC - Abnormal; Notable for the following components:   Color, Urine BROWN (*)    APPearance HAZY (*)    Glucose, UA   (*)    Value: TEST NOT REPORTED DUE TO COLOR INTERFERENCE OF URINE PIGMENT   Hgb urine dipstick   (*)    Value: TEST NOT REPORTED DUE TO COLOR INTERFERENCE OF URINE PIGMENT   Bilirubin Urine   (*)    Value: TEST NOT REPORTED DUE TO COLOR INTERFERENCE OF URINE PIGMENT   Ketones, ur   (*)    Value: TEST NOT REPORTED DUE TO COLOR INTERFERENCE OF URINE PIGMENT   Protein, ur   (*)    Value: TEST NOT REPORTED DUE TO COLOR INTERFERENCE OF URINE PIGMENT   Nitrite   (*)    Value: TEST NOT REPORTED DUE TO COLOR INTERFERENCE OF URINE PIGMENT   Leukocytes,Ua   (*)    Value: TEST NOT REPORTED DUE TO COLOR INTERFERENCE OF URINE PIGMENT   All other components within normal limits  URINALYSIS, MICROSCOPIC (REFLEX)  URINALYSIS, ROUTINE W REFLEX MICROSCOPIC    EKG EKG Interpretation Date/Time:  Monday December 30 2022 13:50:38 EDT Ventricular Rate:  77 PR Interval:  183 QRS Duration:  70 QT Interval:  400 QTC Calculation: 453 R Axis:   73  Text Interpretation: Sinus rhythm when compared to prior, similar appearance. No STEMI  Confirmed by Theda Belfast ([id number]) on 12/30/2022 2:28:43 PM  Radiology No results found.  Procedures Procedures    Medications Ordered in ED Medications  iohexol (OMNIPAQUE) 300 MG/ML solution 100 mL (has no administration in time range)  ondansetron (ZOFRAN-ODT) disintegrating tablet 4 mg (has no administration in time range)  potassium chloride SA (KLOR-CON M) CR tablet 40 mEq (40 mEq Oral Given 12/30/22 1453)  sodium chloride 0.9 % bolus 1,000 mL (1,000 mLs Intravenous New Bag/Given 12/30/22 1455)    ED Course/ Medical Decision Making/ A&P                                 Medical Decision Making Amount and/or Complexity of Data Reviewed Labs:  ordered. Radiology: ordered.  Risk Prescription drug management.   This patient presents to the ED for concern of hematuria, this involves an extensive number of treatment options, and is a complaint that carries with it a high risk of complications and morbidity.  The differential diagnosis includes chronic cystitis, UTI,    Co morbidities that complicate the patient evaluation  Hysterectomy d/t  fibroids and endometriosis    Additional history obtained:  Additional history obtained from chart review    Lab Tests:  I Ordered, and personally interpreted labs.  The pertinent results include:   Cbc unremarkble hgb improved since prior labs 8/3 Bmp K+ 2.7 UA unable to read d/t blood UA x2 PENDING    Imaging Studies ordered:  I ordered imaging studies including CT abd pelvis   I independently visualized and interpreted imaging which showed concern for gastric band slipping, findings suggesting esophagitis, small stable umbilical hernia and small amount of air in bladder.  I agree with the radiologist interpretation   Cardiac Monitoring: / EKG:  The patient was maintained on a cardiac monitor.  I personally viewed and interpreted the cardiac monitored which showed an underlying rhythm of: sinus    Consultations Obtained:  None   Problem List / ED Course / Critical interventions / Medication management  Pt presenting for 4 weeks of hematuria and bright red blood when she wipes.  She has associated nausea fatigue and dizziness. On labs she was found to have low K+, K+ was given PO in ER d/t this. Pt reports chronically low K+, has not taken her K+ pill in x2 days.  I ordered medication including Zofran for nausea.   Reevaluation of the patient after these medicines showed that the patient stayed the same I have reviewed the patients home medicines and have made adjustments as needed   Plan Discussed imaigng and lab findings w/ pt Recommended f/u w/ urology for  hematuria Recommended f/u w/ surgical group who performed gastric band d/t CT finding of possible gastric band slip Recommended f/u w/ PCP to ensure resolution of sx Return to ed if sx cont. Or worsen         Final Clinical Impression(s) / ED Diagnoses Final diagnoses:  None    Rx / DC Orders ED Discharge Orders     None         Smitty Knudsen, PA-C 12/30/22 1820    Tegeler, Canary Brim, MD 12/31/22 [phone]

## 2022-12-30 NOTE — ED Notes (Signed)
Discharge paperwork given and verbally understood. 

## 2023-01-01 ENCOUNTER — Encounter: Payer: Self-pay | Admitting: Student

## 2023-01-01 ENCOUNTER — Ambulatory Visit (INDEPENDENT_AMBULATORY_CARE_PROVIDER_SITE_OTHER): Payer: Commercial Managed Care - PPO | Admitting: Student

## 2023-01-01 ENCOUNTER — Other Ambulatory Visit: Payer: Self-pay

## 2023-01-01 VITALS — BP 130/80 | HR 79 | Temp 98.5°F | Ht 62.0 in | Wt 162.3 lb

## 2023-01-01 DIAGNOSIS — I10 Essential (primary) hypertension: Secondary | ICD-10-CM

## 2023-01-01 DIAGNOSIS — E876 Hypokalemia: Secondary | ICD-10-CM

## 2023-01-01 DIAGNOSIS — Z9884 Bariatric surgery status: Secondary | ICD-10-CM | POA: Diagnosis not present

## 2023-01-01 DIAGNOSIS — R310 Gross hematuria: Secondary | ICD-10-CM | POA: Diagnosis not present

## 2023-01-01 MED ORDER — POTASSIUM CHLORIDE CRYS ER 20 MEQ PO TBCR
20.0000 meq | EXTENDED_RELEASE_TABLET | Freq: Every day | ORAL | 3 refills | Status: DC
Start: 2023-01-01 — End: 2023-11-04

## 2023-01-01 MED ORDER — SPIRONOLACTONE 25 MG PO TABS
25.0000 mg | ORAL_TABLET | Freq: Every day | ORAL | 2 refills | Status: DC
Start: 2023-01-01 — End: 2023-05-19
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
## 2023-01-01 NOTE — Assessment & Plan Note (Signed)
Most recent K on 8/19 2.7 at ED after 3 days of not taking spironolactone of 20 mEq daily potassium. No worsening of her fatigue today. No s[asms, cramps, or abdominal discomfort.  Will have patient take 40 mEq for 1 week in addition of Losartan and Spironolactone. Scheduled BMP in one week to guide titration of oral potassium. She is able to take PO meds at this time. Patient in agreement

## 2023-01-01 NOTE — Assessment & Plan Note (Signed)
Patient with history of gastric banding. Had been experiencing nausea and early satiety. CT pelvis showed gastric band slippage. Discussed with patient who has to establish with new surgeron at [facility] surgery for continued monitoring and possible intervention.

## 2023-01-01 NOTE — Assessment & Plan Note (Signed)
At goal today

## 2023-01-01 NOTE — Patient Instructions (Addendum)
Thank you, Ms.Allison Quarry for allowing [place] to provide your care today. Today we discussed .    For your blood in the urine: We are referring you to Urology - [facility] Stop all medications containing NSAIDs: aspirin, ibuprofen, naproxen, and meloxicam Call your orthopedic doctor for the injection  Low potassiun For the next week, take 40 me (2 pills) of potassium in the AM Continue taking your Spironolactone 25 mg daily and the Losartan Continue eat I need you to come back for a lab visit to check your potassium We will titrate depending what that shows  Gastric banding [facility] surgery - Tell them that a CT scan showed gastric band slippage. Address: [address], [place], [place] [address] Hours: Open ? Closes 5?PM Phone: [phone] I have ordered the following labs for you:  Lab Orders  No laboratory test(s) ordered today     I will call if any are abnormal. All of your labs can be accessed through "My Chart".   My Chart Access: [email or web address]  Please follow-up in: 1 week for a lab visit and then in a month for follow up    We look forward to seeing you next time. Please call our clinic at [phone] if you have any questions or concerns. The best time to call is Monday-Friday from 9am-4pm, but there is someone available 24/7. If after hours or the weekend, call the main hospital number and ask for the Internal Medicine Resident On-Call. If you need medication refills, please notify your pharmacy one week in advance and they will send [place] a request.   Thank you for letting us take part in your care. Wishing you the best!  Morene Crocker, MD 01/01/2023, 9:10 AM [facility] Internal Medicine Resident

## 2023-01-01 NOTE — Assessment & Plan Note (Addendum)
Recently called the office for a 2 week history of peeing red. Patient was recently concerned because she has a history of hysterectomy and she has been seeing clots in her urine. No LUTS, back pain, or recent trauma. She was evaluated in the ED with U/A that showed hematuria without dysmorphic RBC, casts, or proteinuria. No signs of UTI. CT of the pelvis without signs of pyelonephritis, hydronephrosis. Gas in the bladder but no bladder wall inflammation. Hgb and WBC wnl.  Today, she reports history of same without improvement. This is painless. She shared that has been on large doses of NSAIDs for L shoulder pain, including ibuprofen, BC powder arthritis, meloxicam, and others she cannot remember. No food dyes. Reviewed other medications; diuretics, but this is quite rare and patient has been on spironolactone before. She does not have a history of smoking or has a history of bladder cancer in her family. She has not recently traveled.   No overt evidence of glomerular hematuria or structural kidney disease.  Given the presentation about with clots in the urine and lack of urinary retention or evidence of obstruction, will send urgent referral to urology for further evaluation as malignancy is in the differential. In the meantime, suspend NSAIDs and reviewed symptoms to warrant medical attention such as worsening hematuria, inability to void or systemic symptoms.

## 2023-01-04 NOTE — Progress Notes (Signed)
Internal Medicine Clinic Attending  Case discussed with the resident at the time of the visit.  We reviewed the resident's history and exam and pertinent patient test results.  I agree with the assessment, diagnosis, and plan of care documented in the resident's note.  Several-year hx of intermittent gross hematuria with known prior ureteral stone treated with stent, and endometriosis for which she underwent hysterectomy. 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

## 2023-01-08 ENCOUNTER — Other Ambulatory Visit (INDEPENDENT_AMBULATORY_CARE_PROVIDER_SITE_OTHER): Payer: Commercial Managed Care - PPO

## 2023-01-08 DIAGNOSIS — E876 Hypokalemia: Secondary | ICD-10-CM

## 2023-01-08 LAB — BASIC METABOLIC PANEL
Anion gap: 11 (ref 5–15)
BUN: 8 mg/dL (ref 6–20)
CO2: 24 mmol/L (ref 22–32)
Calcium: 9.1 mg/dL (ref 8.9–10.3)
Chloride: 103 mmol/L (ref 98–111)
Creatinine, Ser: 0.92 mg/dL (ref 0.44–1.00)
GFR, Estimated: >60 mL/min (ref >60)
Glucose, Bld: 88 mg/dL (ref 70–99)
Potassium: 3.6 mmol/L (ref 3.5–5.1)
Sodium: 138 mmol/L (ref 135–145)

## 2023-01-08 NOTE — Telephone Encounter (Signed)
Entered in error

## 2023-01-22 ENCOUNTER — Telehealth: Payer: Self-pay

## 2023-01-22 NOTE — Transitions of Care (Post Inpatient/ED Visit) (Signed)
   01/22/2023  Name: Meagan Dean MRN: [id number] DOB: Feb 26, 1971  Today's TOC FU Call Status: Today's TOC FU Call Status:: Unsuccessful Call (1st Attempt) Unsuccessful Call (1st Attempt) Date: 01/22/23  Attempted to reach the patient regarding the most recent Inpatient/ED visit.  Follow Up Plan: Additional outreach attempts will be made to reach the patient to complete the Transitions of Care (Post Inpatient/ED visit) call.   Signature Karena Addison, LPN [facility] Nurse Health Advisor Direct Dial [phone]

## 2023-01-22 NOTE — Transitions of Care (Post Inpatient/ED Visit) (Signed)
01/22/2023  Name: Meagan Dean MRN: [id number] DOB: 03-24-1971  Today's TOC FU Call Status: Today's TOC FU Call Status:: Successful TOC FU Call Completed Unsuccessful Call (1st Attempt) Date: 01/22/23 [facility] FU Call Complete Date: 01/22/23 Patient's Name and Date of Birth confirmed.  Transition Care Management Follow-up Telephone Call Date of Discharge: 01/21/23 Discharge Facility: Other [facility]) Name of Other ([facility]) Discharge Facility: [facility] Type of Discharge: Inpatient Admission Primary Inpatient Discharge Diagnosis:: intestinal obstruction How have you been since you were released from the hospital?: Better Any questions or concerns?: No  Items Reviewed: Did you receive and understand the discharge instructions provided?: Yes Medications obtained,verified, and reconciled?: Yes (Medications Reviewed) Any new allergies since your discharge?: No Dietary orders reviewed?: Yes Do you have support at home?: No  Medications Reviewed Today: Medications Reviewed Today     Reviewed by Karena Addison, LPN (Licensed Practical Nurse) on 01/22/23 at 1034  Med List Status: <None>   Medication Order Taking? Sig Documenting Provider Last Dose Status Informant  acetaminophen (TYLENOL) 500 MG tablet [id number]  Take 2 tablets (1,000 mg total) by mouth every 6 (six) hours. Masters, Florentina Addison, DO  Active   albuterol (VENTOLIN HFA) 108 (90 Base) MCG/ACT inhaler [id number] No Inhale 2 puffs into the lungs. [provider] Taking Active            Med Note Lajoyce Lauber   Thu Nov 21, 2022  1:47 PM) PRN  gabapentin (NEURONTIN) 600 MG tablet [id number]  Take 0.5 tablets (300 mg total) by mouth 3 (three) times daily. Tegeler, Canary Brim, MD  Active   ipratropium-albuterol (DUONEB) 0.5-2.5 (3) MG/3ML SOLN [id number] No Take 3 mLs by nebulization every 6 (six) hours as needed. Netta Corrigan, PA-C Taking Active   loratadine (CLARITIN) 10 MG tablet [id number] No  Take 10 mg by mouth daily. [provider] Taking Active Self  losartan (COZAAR) 25 MG tablet [id number] No Take 1 tablet (25 mg total) by mouth daily. Adron Bene, MD Taking Active   meloxicam (MOBIC) 15 MG tablet [id number]  Take 15 mg by mouth daily. [provider]  Active   ondansetron (ZOFRAN-ODT) 4 MG disintegrating tablet [id number]  Take 1 tablet (4 mg total) by mouth every 8 (eight) hours as needed for nausea or vomiting. Tegeler, Canary Brim, MD  Active   potassium chloride SA (KLOR-CON M20) 20 MEQ tablet [id number]  Take 1 tablet (20 mEq total) by mouth daily. Morene Crocker, MD  Active   Probiotic Product (PROBIOTIC PO) [id number] No Take 1 capsule by mouth daily. [provider] Taking Active Self  spironolactone (ALDACTONE) 25 MG tablet [id number]  Take 1 tablet (25 mg total) by mouth daily. Morene Crocker, MD  Active             Home Care and Equipment/Supplies: Were Home Health Services Ordered?: NA Any new equipment or medical supplies ordered?: NA  Functional Questionnaire: Do you need assistance with bathing/showering or dressing?: No Do you need assistance with meal preparation?: No Do you need assistance with eating?: No Do you have difficulty maintaining continence: No Do you need assistance with getting out of bed/getting out of a chair/moving?: No Do you have difficulty managing or taking your medications?: No  Follow up appointments reviewed: PCP Follow-up appointment confirmed?: No (no avail appts, sent message to staff to schedule) MD Provider Line Number:[phone] Given: No Specialist Hospital Follow-up appointment confirmed?: NA Do you need transportation to your follow-up appointment?: No Do you understand care  options if your condition(s) worsen?: Yes-patient verbalized understanding    SIGNATURE Karena Addison, LPN [facility] Nurse Health Advisor Direct Dial [phone]

## 2023-01-31 ENCOUNTER — Encounter: Payer: Commercial Managed Care - PPO | Admitting: Student

## 2023-01-31 NOTE — Progress Notes (Deleted)
CC: ***  HPI:  Meagan Dean is a 52 y.o. female with a past medical history of hypertension, asthma presents to the clinic for follow-up appointment.  Please see assessment and plan for full HPI.  Medications: Hypertension: Spironolactone 25 mg daily, losartan 25 mg daily Asthma: Albuterol, DuoNeb  Patient was recently seen in the clinic on 01/01/2023. Patient was referred to urology for gross hematuria.  Hypokalemia patient also had potassium 2.7.  Patient did report she has not taken her spironolactone and potassium.  Most recent labs: Potassium 3.6.  This is fine.  Patient was hospitalized on 09/06 for small bowel obstruction.  Past Medical History:  Diagnosis Date   Anemia    ASCUS with positive high risk HPV cervical 03/15/2017   Asthma due to seasonal allergies    MILD-- JUST GOT INHALER-not used yet -allergy induced   Calculus of ureter 06/16/2013   Endometriosis of pelvis    Fibroids 09/02/2017   GERD (gastroesophageal reflux disease)    diet controlled   H/O hiatal hernia    Headache    MIGRAINES - none since weight loss surgery   History of endometriosis 03/28/2017   History of hypertension    NO ISSUES SINCE WT LOSS AFTER GASTRIC BANDING   History of kidney stones    surgery to remove   History of obstructive sleep apnea    DX'D PRIOR TO GASTRIC BANDING  2010 --  NO ISSUES SINCE WT LOSS   Hypertension 03/28/2017   Low potassium syndrome    hopsitalized for 3 days   Right ureteral stone    SBO (small bowel obstruction) (HCC) 07/26/2021   Seasonal allergies    Sinus headache    Sleep apnea    prior to weight loss surgery, does not have cpap   Urgency of urination    UTI (urinary tract infection), bacterial 04/24/2022     Current Outpatient Medications:    acetaminophen (TYLENOL) 500 MG tablet, Take 2 tablets (1,000 mg total) by mouth every 6 (six) hours., Disp: 30 tablet, Rfl: 0   albuterol (VENTOLIN HFA) 108 (90 Base) MCG/ACT inhaler, Inhale 2  puffs into the lungs., Disp: , Rfl:    gabapentin (NEURONTIN) 600 MG tablet, Take 0.5 tablets (300 mg total) by mouth 3 (three) times daily., Disp: 30 tablet, Rfl: 0   ipratropium-albuterol (DUONEB) 0.5-2.5 (3) MG/3ML SOLN, Take 3 mLs by nebulization every 6 (six) hours as needed., Disp: 360 mL, Rfl: 0   loratadine (CLARITIN) 10 MG tablet, Take 10 mg by mouth daily., Disp: , Rfl:    losartan (COZAAR) 25 MG tablet, Take 1 tablet (25 mg total) by mouth daily., Disp: 30 tablet, Rfl: 11   meloxicam (MOBIC) 15 MG tablet, Take 15 mg by mouth daily., Disp: , Rfl:    ondansetron (ZOFRAN-ODT) 4 MG disintegrating tablet, Take 1 tablet (4 mg total) by mouth every 8 (eight) hours as needed for nausea or vomiting., Disp: 20 tablet, Rfl: 0   potassium chloride SA (KLOR-CON M20) 20 MEQ tablet, Take 1 tablet (20 mEq total) by mouth daily., Disp: 30 tablet, Rfl: 3   Probiotic Product (PROBIOTIC PO), Take 1 capsule by mouth daily., Disp: , Rfl:    spironolactone (ALDACTONE) 25 MG tablet, Take 1 tablet (25 mg total) by mouth daily., Disp: 30 tablet, Rfl: 2  Review of Systems:  ***  Constitutional: Eye: Respiratory: Cardiovascular: GI: MSK: GU: Skin: Neuro: Endocrine:   Physical Exam:  There were no vitals filed for this visit. ***  General: Patient is sitting comfortably in the room  Eyes: Pupils equal and reactive to light, EOM intact  Head: Normocephalic, atraumatic  Neck: Supple, nontender, full range of motion, No JVD Cardio: Regular rate and rhythm, no murmurs, rubs or gallops. 2+ pulses to bilateral upper and lower extremities  Chest: No chest tenderness Pulmonary: Clear to ausculation bilaterally with no rales, rhonchi, and crackles  Abdomen: Soft, nontender with normoactive bowel sounds with no rebound or guarding  Neuro: Alert and orientated x3. CN II-XII intact. Sensation intact to upper and lower extremities. 2+ patellar reflex.  Back: No midline tenderness, no step off or deformities  noted. No paraspinal muscle tenderness.  Skin: No rashes noted  MSK: 5/5 strength to upper and lower extremities.    Assessment & Plan:   No problem-specific Assessment & Plan notes found for this encounter.    Patient {GC/GE:3044014::"discussed with","seen with"} Dr. {NAMES:[id number]::"Guilloud","Hoffman","Mullen","Narendra","Williams","Vincent"}  Modena Slater, DO PGY-2 Internal Medicine Resident  Pager: [phone]

## 2023-02-05 ENCOUNTER — Encounter: Payer: Self-pay | Admitting: Student

## 2023-02-19 ENCOUNTER — Encounter: Payer: Commercial Managed Care - PPO | Admitting: Student

## 2023-02-20 ENCOUNTER — Encounter: Payer: Commercial Managed Care - PPO | Admitting: Internal Medicine
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
## 2023-05-15 ENCOUNTER — Other Ambulatory Visit: Payer: Self-pay | Admitting: Student

## 2023-05-15 DIAGNOSIS — E876 Hypokalemia: Secondary | ICD-10-CM
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
## 2023-11-04 ENCOUNTER — Ambulatory Visit (INDEPENDENT_AMBULATORY_CARE_PROVIDER_SITE_OTHER): Payer: Self-pay | Admitting: Student

## 2023-11-04 ENCOUNTER — Other Ambulatory Visit (HOSPITAL_COMMUNITY)
Admission: RE | Admit: 2023-11-04 | Discharge: 2023-11-04 | Disposition: A | Discharge status: Home / Self Care | Payer: Self-pay | Source: Ambulatory Visit | Attending: Family Medicine | Admitting: Family Medicine

## 2023-11-04 VITALS — BP 106/75 | HR 89 | Temp 98.5°F | Ht 62.0 in | Wt 161.0 lb

## 2023-11-04 DIAGNOSIS — N3000 Acute cystitis without hematuria: Secondary | ICD-10-CM

## 2023-11-04 DIAGNOSIS — Z113 Encounter for screening for infections with a predominantly sexual mode of transmission: Secondary | ICD-10-CM

## 2023-11-04 DIAGNOSIS — R1030 Lower abdominal pain, unspecified: Secondary | ICD-10-CM

## 2023-11-04 DIAGNOSIS — E876 Hypokalemia: Secondary | ICD-10-CM

## 2023-11-04 LAB — POCT URINALYSIS DIPSTICK
Blood, UA: NEGATIVE
Glucose, UA: NEGATIVE
Nitrite, UA: NEGATIVE
Protein, UA: POSITIVE — AB
Spec Grav, UA: >=1.03 — AB (ref 1.010–1.025)
Urobilinogen, UA: 1 U/dL
pH, UA: 6 (ref 5.0–8.0)

## 2023-11-04 MED ORDER — SULFAMETHOXAZOLE-TRIMETHOPRIM 800-160 MG PO TABS
1.0000 | ORAL_TABLET | Freq: Two times a day (BID) | ORAL | 0 refills | Status: AC
Start: 2023-11-04 — End: 2023-11-07

## 2023-11-04 MED ORDER — POTASSIUM CHLORIDE CRYS ER 20 MEQ PO TBCR: 20.0000 meq | EXTENDED_RELEASE_TABLET | Freq: Every day | ORAL | 3 refills | Status: DC

## 2023-11-04 NOTE — Assessment & Plan Note (Signed)
 Probably from cystitis. S/p hysterectomy with retained ovaries, this could be pelvic organ pain but ascending gynecologic infection less likely. Workup with CMP, hopeful for improvement with treatment of UTI.

## 2023-11-04 NOTE — Assessment & Plan Note (Signed)
 Given exposure and pelvic symptoms checking for GC/chlamydia, trichomonas, HIV, syphilis.

## 2023-11-04 NOTE — Assessment & Plan Note (Signed)
 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 She has history of kidney stones but presentation atypical for that. I don't think this is STI but will screen given unprotected sex. Start bactrim  double-strength tablet BID x 3 days based on prior susceptibility data. UA and culture pending.

## 2023-11-04 NOTE — Progress Notes (Signed)
 Patient name: Meagan Dean Date of birth: 1970-09-19 Date of visit: 11/04/23  Subjective   Chief concern: a little burning and peeing a whole lot  2-3 weeks of UTI type symptoms. Having frequency and urgency, sometimes doesn't make it to the bathroom in time. Having some back pain as well. Feeling bloated. Has had kidney stones in the past but this doesn't feel like this. Has tried lots of hydration but symptoms have gotten worse.   History of s. Lugdunensis UTI. Also gross hematuria thought associated with kidney stones for which she has seen urologist in the past, sounds like they did cystoscopy with bladder irrigation.  Sexually active without protection.   Review of Systems  Constitutional:  Positive for malaise/fatigue. Negative for fever.  Gastrointestinal:  Positive for abdominal pain (with bloating). Negative for blood in stool, constipation, diarrhea and vomiting.  Genitourinary:  Positive for flank pain, frequency and urgency. Negative for hematuria.       No vaginal discharge   Musculoskeletal:  Negative for falls.    Current Outpatient Medications  Medication Instructions   acetaminophen  (TYLENOL ) 1,000 mg, Oral, Every 6 hours   albuterol  (VENTOLIN  HFA) 108 (90 Base) MCG/ACT inhaler 2 puffs, Inhalation   gabapentin  (NEURONTIN ) 300 mg, Oral, 3 times daily   ipratropium-albuterol  (DUONEB) 0.5-2.5 (3) MG/3ML SOLN 3 mLs, Nebulization, Every 6 hours PRN   loratadine  (CLARITIN ) 10 mg, Oral, Daily   losartan  (COZAAR ) 25 mg, Oral, Daily   meloxicam  (MOBIC ) 15 mg, Oral, Daily   ondansetron  (ZOFRAN -ODT) 4 mg, Oral, Every 8 hours PRN   potassium chloride  SA (KLOR-CON  M20) 20 MEQ tablet 20 mEq, Oral, Daily   Probiotic Product (PROBIOTIC PO) 1 capsule, Oral, Daily   spironolactone  (ALDACTONE ) 25 mg, Oral, Daily     Objective  Today's Vitals   11/04/23 0840  BP: 106/75  Pulse: 89  Temp: 98.5 F (36.9 C)  TempSrc: Oral  SpO2: 97%  Weight: 161 lb (73 kg)  Height: 5'  2 (1.575 m)  PainSc: 2   PainLoc: Back  Body mass index is 29.45 kg/m.   Physical Exam Constitutional:      General: She is not in acute distress.    Appearance: Normal appearance.  HENT:     Mouth/Throat:     Mouth: Mucous membranes are moist.     Pharynx: No oropharyngeal exudate or posterior oropharyngeal erythema.  Neck:     Thyroid : No thyroid  mass, thyromegaly or thyroid  tenderness.     Vascular: No carotid bruit.   Cardiovascular:     Rate and Rhythm: Normal rate and regular rhythm.     Pulses: Normal pulses.     Heart sounds: No murmur heard. Pulmonary:     Effort: Pulmonary effort is normal.     Breath sounds: Normal breath sounds. No wheezing or rales.  Abdominal:     Palpations: Abdomen is soft.     Tenderness: There is abdominal tenderness (generalized lower abdomen). There is no right CVA tenderness or left CVA tenderness.   Musculoskeletal:     Right lower leg: No edema.     Left lower leg: No edema.  Lymphadenopathy:     Cervical: No cervical adenopathy.   Skin:    General: Skin is warm and dry.   Neurological:     Mental Status: She is alert. Mental status is at baseline.     Cranial Nerves: No facial asymmetry.     Motor: No tremor.   Psychiatric:  Mood and Affect: Mood normal.        Behavior: Behavior normal.     Assessment & Plan  Problem List Items Addressed This Visit     Hypokalemia (Chronic)   Relevant Medications   potassium chloride  SA (KLOR-CON  M20) 20 MEQ tablet   Screening examination for STI   Given exposure and pelvic symptoms checking for GC/chlamydia, trichomonas, HIV, syphilis.      Relevant Orders   HIV antibody (with reflex)   RPR   Lower abdominal pain   Probably from cystitis. S/p hysterectomy with retained ovaries, this could be pelvic organ pain but ascending gynecologic infection less likely. Workup with CMP, hopeful for improvement with treatment of UTI.      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 She has history of kidney stones but presentation atypical for that. I don't think this is STI but will screen given unprotected sex. Start bactrim  double-strength tablet BID x 3 days based on prior susceptibility data. UA and culture pending.      Relevant Medications   sulfamethoxazole -trimethoprim  (BACTRIM  DS) 800-160 MG tablet   Other Relevant Orders   Culture, Urine   Urine cytology ancillary only   CBC with Diff   CMP14 + Anion Gap   POCT Urinalysis Dipstick (18997) (Completed)   Return in about 3 months (around 02/04/2024) for routine follow-up.  Ozell Kung MD 11/04/2023, 9:02 AM

## 2023-11-04 NOTE — Patient Instructions (Signed)
 Return in about 3 months (around 02/04/2024) for routine follow-up.   Remember to bring all of the medications that you take (including over the counter medications and supplements) with you to every clinic visit.  This after visit summary is an important review of tests, referrals, and medication changes that were discussed during your visit. If you have questions or concerns, call [phone]. Outside of clinic business hours, call the main hospital at [phone] and ask the operator for the on-call internal medicine resident.   Ozell Kung MD 11/04/2023, 9:22 AM

## 2023-11-05 ENCOUNTER — Ambulatory Visit: Payer: Self-pay | Admitting: Student

## 2023-11-05 LAB — CMP14 + ANION GAP
ALT: 10 IU/L (ref 0–32)
AST: 17 IU/L (ref 0–40)
Albumin: 4.2 g/dL (ref 3.8–4.9)
Alkaline Phosphatase: 86 IU/L (ref 44–121)
Anion Gap: 16 mmol/L (ref 10.0–18.0)
BUN/Creatinine Ratio: 11 (ref 9–23)
BUN: 11 mg/dL (ref 6–24)
Bilirubin Total: 0.3 mg/dL (ref 0.0–1.2)
CO2: 23 mmol/L (ref 20–29)
Calcium: 9.3 mg/dL (ref 8.7–10.2)
Chloride: 105 mmol/L (ref 96–106)
Creatinine, Ser: 0.97 mg/dL (ref 0.57–1.00)
Globulin, Total: 2.7 g/dL (ref 1.5–4.5)
Glucose: 86 mg/dL (ref 70–99)
Potassium: 3.3 mmol/L — ABNORMAL LOW (ref 3.5–5.2)
Sodium: 144 mmol/L (ref 134–144)
Total Protein: 6.9 g/dL (ref 6.0–8.5)
eGFR: 70 mL/min/{1.73_m2} (ref >59)

## 2023-11-05 LAB — URINE CYTOLOGY ANCILLARY ONLY
Chlamydia: NEGATIVE
Comment: NEGATIVE
Comment: NEGATIVE
Comment: NORMAL
Neisseria Gonorrhea: NEGATIVE
Trichomonas: NEGATIVE

## 2023-11-05 LAB — CBC WITH DIFFERENTIAL/PLATELET
Basophils Absolute: 0 10*3/uL (ref 0.0–0.2)
Basos: 0 %
EOS (ABSOLUTE): 0.1 10*3/uL (ref 0.0–0.4)
Eos: 1 %
Hematocrit: 36.1 % (ref 34.0–46.6)
Hemoglobin: 11.6 g/dL (ref 11.1–15.9)
Immature Grans (Abs): 0 10*3/uL (ref 0.0–0.1)
Immature Granulocytes: 0 %
Lymphocytes Absolute: 2 10*3/uL (ref 0.7–3.1)
Lymphs: 37 %
MCH: 28.6 pg (ref 26.6–33.0)
MCHC: 32.1 g/dL (ref 31.5–35.7)
MCV: 89 fL (ref 79–97)
Monocytes Absolute: 0.3 10*3/uL (ref 0.1–0.9)
Monocytes: 6 %
Neutrophils Absolute: 2.9 10*3/uL (ref 1.4–7.0)
Neutrophils: 56 %
Platelets: 338 10*3/uL (ref 150–450)
RBC: 4.06 x10E6/uL (ref 3.77–5.28)
RDW: 13.4 % (ref 11.7–15.4)
WBC: 5.3 10*3/uL (ref 3.4–10.8)

## 2023-11-05 LAB — RPR: RPR Ser Ql: NONREACTIVE

## 2023-11-05 LAB — HIV ANTIBODY (ROUTINE TESTING W REFLEX): HIV Screen 4th Generation wRfx: NONREACTIVE

## 2023-11-06 ENCOUNTER — Ambulatory Visit: Payer: Self-pay

## 2023-11-06 NOTE — Telephone Encounter (Signed)
 FYI Only or Action Required?: Action required by provider: clinical question for provider.  Patient was last seen in primary care on 11/04/2023 by Norrine Sharper, MD. Called Nurse Triage reporting Urinary Tract Infection. Symptoms began yesterday. Interventions attempted: Prescription medications: bactrim . Symptoms are: gradually worsening.  Triage Disposition: See HCP Within 4 Hours (Or PCP Triage)  Patient/caregiver understands and will follow disposition?: No, wishes to speak with PCP  Copied from CRM [id number]. Topic: Clinical - Red Word Triage >> Nov 06, 2023  4:31 PM Fredrica W wrote: Red Word that prompted transfer to Nurse Triage: Severe pain lower back abdomen/vaginal medication not working - worsening, burning Reason for Disposition  [1] Side (flank) or lower back pain AND [2] new-onset since starting antibiotics  Answer Assessment - Initial Assessment Questions 1. MAIN SYMPTOM: What is the main symptom you are concerned about? (e.g., painful urination, urine frequency)     Vaginal pain, back pain, burning sensation 2. BETTER-SAME-WORSE: Are you getting better, staying the same, or getting worse compared to how you felt at your last visit to the doctor (most recent medical visit)?     worse 3. PAIN: How bad is the pain?  (e.g., Scale 1-10; mild, moderate, or severe)   - MILD (1-3): complains slightly about urination hurting   - MODERATE (4-7): interferes with normal activities     - SEVERE (8-10): excruciating, unwilling or unable to urinate because of the pain      8 4. FEVER: Do you have a fever? If Yes, ask: What is it, how was it measured, and when did it start?     denies 5. OTHER SYMPTOMS: Do you have any other symptoms? (e.g., blood in the urine, flank pain, vaginal discharge)     Back pain 6. DIAGNOSIS: When was the UTI diagnosed? By whom? Was it a kidney infection, bladder infection or both?     Pcp clinic 7. ANTIBIOTIC: What antibiotic(s) are you  taking? How many times per day?     bactrim  8. ANTIBIOTIC - START DATE: When did you start taking the antibiotic?     Only one pill left  Pt states that she has had an abx in the past from [facility] that worked well. Pt states that she does not believe the medication is helping. Pt states sse feels she is getting worse. Pt states that she is leaving out of town in the morning and cannot go to [facility] today. No appts avail until Aug. Routing HP to clinic.  Protocols used: Urinary Tract Infection on Antibiotic Follow-up Call - [facility]

## 2023-11-07 NOTE — Telephone Encounter (Signed)
 Pt was called - no answer; left message on self-identified vm to call the office to schedule an appt.

## 2023-11-09 LAB — URINE CULTURE

## 2023-11-13 ENCOUNTER — Ambulatory Visit: Payer: Self-pay

## 2023-11-13 NOTE — Telephone Encounter (Signed)
 FYI Only or Action Required?: FYI only for provider.  Patient was last seen in primary care on 11/04/2023 by Norrine Sharper, MD. Called Nurse Triage reporting Back Pain. Symptoms began a week ago. Interventions attempted: Rest, hydration, or home remedies and Other: was placed on antibiotics last week and finished-no improvement in symptoms. Symptoms are: unchanged.  Triage Disposition: See HCP Within 4 Hours (Or PCP Triage)-recommended to ED due to no availability in office  Patient/caregiver understands and will follow disposition?: Yes  Copied from CRM [id number]. Topic: Clinical - Red Word Triage >> Nov 13, 2023  9:31 AM Graeme ORN wrote: Red Word that prompted transfer to Nurse Triage: Back pain - lower back killing her, no appetite, nausea, frequent urination. Previously seen - worsening Reason for Disposition  Pain or burning with passing urine (urination)  Answer Assessment - Initial Assessment Questions 1. LOCATION: Where does it hurt? (e.g., left, right)     Both left and right side 2. ONSET: When did the pain start?     Started last week 3. SEVERITY: How bad is the pain? (e.g., Scale 1-10; mild, moderate, or severe)   - MILD (1-3): doesn't interfere with normal activities    - MODERATE (4-7): interferes with normal activities or awakens from sleep    - SEVERE (8-10): excruciating pain and patient unable to do normal activities (stays in bed)       7-8 out of 10 4. PATTERN: Does the pain come and go, or is it constant?      constant 5. CAUSE: What do you think is causing the pain?     Patient questioning if she has kidney stones 6. OTHER SYMPTOMS:  Do you have any other symptoms? (e.g., fever, abdomen pain, vomiting, leg weakness, burning with urination, blood in urine)     Nausea, abdominal pain and bloating  Hx of UTIs-urinary frequency and burning with urination today. Was given a round of antibiotics but symptoms aren't any better.  Protocols used: Flank  Pain-A-AH

## 2023-11-16 ENCOUNTER — Other Ambulatory Visit: Payer: Self-pay

## 2023-11-16 ENCOUNTER — Emergency Department (HOSPITAL_COMMUNITY): Payer: Self-pay

## 2023-11-16 ENCOUNTER — Emergency Department (HOSPITAL_COMMUNITY)
Admission: EM | Admit: 2023-11-16 | Discharge: 2023-11-16 | Disposition: A | Discharge status: Home / Self Care | Payer: Self-pay | Attending: Emergency Medicine | Admitting: Emergency Medicine

## 2023-11-16 DIAGNOSIS — E876 Hypokalemia: Secondary | ICD-10-CM | POA: Insufficient documentation

## 2023-11-16 DIAGNOSIS — Z91040 Latex allergy status: Secondary | ICD-10-CM | POA: Insufficient documentation

## 2023-11-16 DIAGNOSIS — R110 Nausea: Secondary | ICD-10-CM

## 2023-11-16 DIAGNOSIS — R1084 Generalized abdominal pain: Secondary | ICD-10-CM

## 2023-11-16 DIAGNOSIS — K5900 Constipation, unspecified: Secondary | ICD-10-CM | POA: Insufficient documentation

## 2023-11-16 LAB — URINALYSIS, ROUTINE W REFLEX MICROSCOPIC
Bilirubin Urine: NEGATIVE
Glucose, UA: NEGATIVE mg/dL
Hgb urine dipstick: NEGATIVE
Ketones, ur: NEGATIVE mg/dL
Leukocytes,Ua: NEGATIVE
Nitrite: NEGATIVE
Protein, ur: NEGATIVE mg/dL
Specific Gravity, Urine: 1.01 (ref 1.005–1.030)
pH: 5 (ref 5.0–8.0)

## 2023-11-16 LAB — CBC WITH DIFFERENTIAL/PLATELET
Abs Immature Granulocytes: 0.01 K/uL (ref 0.00–0.07)
Basophils Absolute: 0 K/uL (ref 0.0–0.1)
Basophils Relative: 0 %
Eosinophils Absolute: 0.1 K/uL (ref 0.0–0.5)
Eosinophils Relative: 1 %
HCT: 37 % (ref 36.0–46.0)
Hemoglobin: 12.2 g/dL (ref 12.0–15.0)
Immature Granulocytes: 0 %
Lymphocytes Relative: 44 %
Lymphs Abs: 2.4 K/uL (ref 0.7–4.0)
MCH: 28.7 pg (ref 26.0–34.0)
MCHC: 33 g/dL (ref 30.0–36.0)
MCV: 87.1 fL (ref 80.0–100.0)
Monocytes Absolute: 0.3 K/uL (ref 0.1–1.0)
Monocytes Relative: 5 %
Neutro Abs: 2.7 K/uL (ref 1.7–7.7)
Neutrophils Relative %: 50 %
Platelets: 340 K/uL (ref 150–400)
RBC: 4.25 MIL/uL (ref 3.87–5.11)
RDW: 13.4 % (ref 11.5–15.5)
WBC: 5.4 K/uL (ref 4.0–10.5)
nRBC: 0 % (ref 0.0–0.2)

## 2023-11-16 LAB — COMPREHENSIVE METABOLIC PANEL WITH GFR
ALT: 9 U/L (ref 0–44)
AST: 15 U/L (ref 15–41)
Albumin: 4.1 g/dL (ref 3.5–5.0)
Alkaline Phosphatase: 72 U/L (ref 38–126)
Anion gap: 9 (ref 5–15)
BUN: 15 mg/dL (ref 6–20)
CO2: 25 mmol/L (ref 22–32)
Calcium: 9.1 mg/dL (ref 8.9–10.3)
Chloride: 104 mmol/L (ref 98–111)
Creatinine, Ser: 0.93 mg/dL (ref 0.44–1.00)
GFR, Estimated: >60 mL/min (ref >60)
Glucose, Bld: 88 mg/dL (ref 70–99)
Potassium: 2.9 mmol/L — ABNORMAL LOW (ref 3.5–5.1)
Sodium: 138 mmol/L (ref 135–145)
Total Bilirubin: 0.7 mg/dL (ref 0.0–1.2)
Total Protein: 7.7 g/dL (ref 6.5–8.1)

## 2023-11-16 LAB — LIPASE, BLOOD: Lipase: 44 U/L (ref 11–51)

## 2023-11-16 MED ORDER — POTASSIUM CHLORIDE 10 MEQ/100ML IV SOLN
10.0000 meq | INTRAVENOUS | Status: AC
  Administered 2023-11-16 (×2): 10 meq via INTRAVENOUS
  Filled 2023-11-16 (×2): qty 100

## 2023-11-16 MED ORDER — MORPHINE SULFATE (PF) 4 MG/ML IV SOLN
4.0000 mg | Freq: Once | INTRAVENOUS | Status: AC
  Administered 2023-11-16: 4 mg via INTRAVENOUS
  Filled 2023-11-16: qty 1

## 2023-11-16 MED ORDER — POTASSIUM CHLORIDE CRYS ER 20 MEQ PO TBCR
40.0000 meq | EXTENDED_RELEASE_TABLET | Freq: Once | ORAL | Status: AC
  Administered 2023-11-16: 40 meq via ORAL
  Filled 2023-11-16: qty 2

## 2023-11-16 MED ORDER — ONDANSETRON HCL 4 MG/2ML IJ SOLN
4.0000 mg | Freq: Once | INTRAMUSCULAR | Status: AC
  Administered 2023-11-16: 4 mg via INTRAVENOUS
  Filled 2023-11-16: qty 2

## 2023-11-16 MED ORDER — SODIUM CHLORIDE 0.9 % IV BOLUS
500.0000 mL | Freq: Once | INTRAVENOUS | Status: AC
  Administered 2023-11-16: 500 mL via INTRAVENOUS

## 2023-11-16 MED ORDER — IOHEXOL 300 MG/ML  SOLN
100.0000 mL | Freq: Once | INTRAMUSCULAR | Status: AC | PRN
  Administered 2023-11-16: 100 mL via INTRAVENOUS

## 2023-11-16 NOTE — Discharge Instructions (Addendum)
 I have provided you with the contact information for [facility], please schedule follow-up for management of your chronic conditions since you do not have a primary care provider.  I encourage you to maintain a diet high in fiber as well as continue to hydrate well, this will help alleviate your constipation.  You can use MiraLAX , 17 g daily as needed for constipation.  Continue Zofran , take every 8 hours as needed for nausea.

## 2023-11-16 NOTE — ED Notes (Signed)
 Discharge instructions reviewed with patient. Patient questions answered and opportunity for education reviewed. Patient voices understanding of discharge instructions with no further questions. Patient ambulatory with steady gait to lobby.

## 2023-11-16 NOTE — ED Provider Notes (Signed)
  Physical Exam   Vitals:   11/16/23 1141 11/16/23 1150 11/16/23 1450  BP: 111/84  115/89  Pulse: 87  77  Resp: 16  18  Temp: 98.5 F (36.9 C)  98 F (36.7 C)  TempSrc: Oral  Oral  SpO2: 100%  100%  Weight:  73 kg   Height:  5' 2 (1.575 m)      Physical Exam  Procedures  Procedures  ED Course / MDM    Medical Decision Making Amount and/or Complexity of Data Reviewed Labs: ordered. Radiology: ordered.  Risk Prescription drug management.   Patient handed-off at shift change from morning PA-C Bowie Tran, see their note for initial history, physical exam findings, lab/imaging interpretation, assessment, and plan.   Patient presenting with worsening abdominal pain x 5 days, no bowel movement in 5 days.  History of SBO, was hospitalized at outside facility in September 2024.  Patient found to be hypokalemic, p.o./IV potassium ordered but not administered prior to shift change.  Pending CT and potassium replenishment, will use p.o. potassium as p.o. challenge.  Lipase WNL.  CT results:  1. No acute CT findings of the abdomen or pelvis to explain abdominal pain. 2. Fairly broad-based midline ventral hernia containing nonobstructed loops of mid small bowel, appearance and configuration unchanged compared to prior examination. 3. Lower esophagus is distended and fluid-filled, which may reflect reflux or excessive restriction by gastric lap band, and may place the patient at risk of aspiration. 4. Adjustable gastric lap band and reservoir. 5. Status post appendectomy and hysterectomy.  At time of reassessment, patient tells me that she was recently treated for a UTI at the end of June with a 3-day course of Bactrim  per her PCP, she is wondering if she is continuing to have a urinary tract infection as she reports occasional dysuria.  Upon review she did have urinary tract infection with K pneumonia detected on urine culture.  Urinalysis pending.  Patient was able to tolerate  p.o. potassium without difficulty, no additional episodes of vomiting however she does endorse continued nausea.   Urinalysis within normal limits, no evidence of continued infection.  I suspect that patient's abdominal discomfort may be secondary to constipation, encouraged her use MiraLAX  daily as needed for constipation, was recently prescribed Zofran  so I encouraged her to continue this as needed for nausea.  She voiced understanding and is in agreement with this plan.  She does have a primary care provider and plans to follow-up with her if her symptoms persist.  Return precautions discussed.  She has appropriate discharge at this time.       Meagan Dean, [place] 11/16/23 1942    Patsey Lot, MD 11/17/23 [phone]

## 2023-11-16 NOTE — ED Provider Notes (Signed)
 [place] EMERGENCY DEPARTMENT AT [facility] Provider Note   CSN: [id number] Arrival date & time: 11/16/23  1135     Patient presents with: Abdominal Pain   Meagan Dean is a 53 y.o. female.   The history is provided by the patient and medical records. No language interpreter was used.  Abdominal Pain    53 year old female history of GERD, recurrent UTI, SBO and prior surgical history which include hysterectomy, appendectomy and exploratory laparotomy presenting with complaint of abdominal pain.  Patient states for the past 3 weeks she has had recurrent abdominal discomfort.  She described as a cramping tightness sensation that is waxing and waning but never fully resolved.  She noticed that she is urinating more frequent than usual.  She attributed her symptoms due to increasing stress at work however for the past 5 days she has not had a bowel movement and is symptom is not after control.  She feels her abdomen is distended and she does not have much of an appetite.  She is able to pass flatus.  She denies any fever or chills no chest pain or trouble breathing.  She has had history of SBO in the past but this felt a bit different.  She denies alcohol abuse.  Prior to Admission medications   Medication Sig Start Date End Date Taking? Authorizing Provider  acetaminophen  (TYLENOL ) 500 MG tablet Take 2 tablets (1,000 mg total) by mouth every 6 (six) hours. 11/29/22   Masters, Katie, DO  albuterol  (VENTOLIN  HFA) 108 (90 Base) MCG/ACT inhaler Inhale 2 puffs into the lungs. 08/02/22   [provider]  gabapentin  (NEURONTIN ) 600 MG tablet Take 0.5 tablets (300 mg total) by mouth 3 (three) times daily. 11/22/22   Tegeler, Lonni PARAS, MD  ipratropium-albuterol  (DUONEB) 0.5-2.5 (3) MG/3ML SOLN Take 3 mLs by nebulization every 6 (six) hours as needed. 08/09/22   Victor Lynwood DASEN, PA-C  loratadine  (CLARITIN ) 10 MG tablet Take 10 mg by mouth daily.    [provider]   losartan  (COZAAR ) 25 MG tablet Take 1 tablet (25 mg total) by mouth daily. 05/16/22 05/16/23  Teresa Carrier, MD  meloxicam  (MOBIC ) 15 MG tablet Take 15 mg by mouth daily. 12/02/22   [provider]  ondansetron  (ZOFRAN -ODT) 4 MG disintegrating tablet Take 1 tablet (4 mg total) by mouth every 8 (eight) hours as needed for nausea or vomiting. 11/22/22   Tegeler, Lonni PARAS, MD  potassium chloride  SA (KLOR-CON  M20) 20 MEQ tablet Take 1 tablet (20 mEq total) by mouth daily. 11/04/23 05/02/24  Norrine Sharper, MD  Probiotic Product (PROBIOTIC PO) Take 1 capsule by mouth daily.    [provider]  spironolactone  (ALDACTONE ) 25 MG tablet TAKE 1 TABLET (25 MG TOTAL) BY MOUTH DAILY. 05/19/23 08/17/23  Elnora Ip, MD    Allergies: Latex, Reglan  [metoclopramide ], and Vicodin [hydrocodone-acetaminophen ]    Review of Systems  Gastrointestinal:  Positive for abdominal pain.  All other systems reviewed and are negative.   Updated Vital Signs BP 111/84 (BP Location: Left Arm)   Pulse 87   Temp 98.5 F (36.9 C) (Oral)   Resp 16   Ht 5' 2 (1.575 m)   Wt 73 kg   LMP  (LMP Unknown) Comment: 07/2016  SpO2 100%   BMI 29.44 kg/m   Physical Exam Vitals and nursing note reviewed.  Constitutional:      General: She is not in acute distress.    Appearance: She is well-developed.  HENT:  Head: Atraumatic.  Eyes:     Conjunctiva/sclera: Conjunctivae normal.  Cardiovascular:     Rate and Rhythm: Normal rate and regular rhythm.     Heart sounds: Normal heart sounds.  Pulmonary:     Effort: Pulmonary effort is normal.  Abdominal:     General: Abdomen is flat. Bowel sounds are decreased.     Palpations: Abdomen is soft.     Tenderness: There is abdominal tenderness. There is no guarding or rebound. Negative signs include Murphy's sign, Rovsing's sign and McBurney's sign.  Musculoskeletal:     Cervical back: Neck supple.  Skin:    Findings: No rash.  Neurological:      Mental Status: She is alert.  Psychiatric:        Mood and Affect: Mood normal.     (all labs ordered are listed, but only abnormal results are displayed) Labs Reviewed  COMPREHENSIVE METABOLIC PANEL WITH GFR - Abnormal; Notable for the following components:      Result Value   Potassium 2.9 (*)    All other components within normal limits  CBC WITH DIFFERENTIAL/PLATELET  URINALYSIS, ROUTINE W REFLEX MICROSCOPIC  LIPASE, BLOOD    EKG: None  Radiology: No results found.   Procedures   Medications Ordered in the ED  morphine  (PF) 4 MG/ML injection 4 mg (has no administration in time range)  ondansetron  (ZOFRAN ) injection 4 mg (has no administration in time range)                                    Medical Decision Making Amount and/or Complexity of Data Reviewed Labs: ordered. Radiology: ordered.  Risk Prescription drug management.   BP 111/84 (BP Location: Left Arm)   Pulse 87   Temp 98.5 F (36.9 C) (Oral)   Resp 16   Ht 5' 2 (1.575 m)   Wt 73 kg   LMP  (LMP Unknown) Comment: 07/2016  SpO2 100%   BMI 29.44 kg/m   20:70 PM  53 year old female history of GERD, recurrent UTI, SBO and prior surgical history which include hysterectomy, appendectomy and exploratory laparotomy presenting with complaint of abdominal pain.  Patient states for the past 3 weeks she has had recurrent abdominal discomfort.  She described as a cramping tightness sensation that is waxing and waning but never fully resolved.  She noticed that she is urinating more frequent than usual.  She attributed her symptoms due to increasing stress at work however for the past 5 days she has not had a bowel movement and is symptom is not after control.  She feels her abdomen is distended and she does not have much of an appetite.  She is able to pass flatus.  She denies any fever or chills no chest pain or trouble breathing.  She has had history of SBO in the past but this felt a bit different.  She  denies alcohol abuse.  On exam, patient appears to be in no acute discomfort.  Heart with normal rate and rhythm, lungs are clear to auscultation, abdomen is diffusely tender with decreased bowel sounds.  I am able to palpate a mass in her abdominal wall which correlate to her gastric banding.  No obvious hernia noted.  -Labs ordered, independently viewed and interpreted by me.  Labs remarkable for potassium of 2.9, supplementation given -The patient was maintained on a cardiac monitor.  I personally viewed and interpreted the cardiac  monitored which showed an underlying rhythm of: Sinus rhythm -Imaging including abd/pelvis CT which is currently pending.  -This patient presents to the ED for concern of abd pain, this involves an extensive number of treatment options, and is a complaint that carries with it a high risk of complications and morbidity.  The differential diagnosis includes SBO, colitis, diverticulitis, cholecystitis, constipation, GERD, gastritis, PUD, UTI -Co morbidities that complicate the patient evaluation includes GERD, SBO, UTI -Treatment includes morphine , zofran , IVF -Reevaluation of the patient after these medicines showed that the patient improved -PCP office notes or outside notes reviewed -Discussion with oncoming team who will f/u on CT and reassess -Escalation to admission/observation considered: dispo pending.       Final diagnoses:  None    ED Discharge Orders     None          Nivia Colon, PA-C 11/16/23 1504    Dean Clarity, MD 11/19/23 [phone]

## 2023-11-16 NOTE — ED Triage Notes (Signed)
 Pt complaining of abdominal pain followed with nausea and vomiting. Pt states that she has not been able to eat or drink much because of the pain. Sent by primary provider for further evaluation.

## 2023-11-18 NOTE — Progress Notes (Signed)
 Internal Medicine Clinic Attending  Case discussed with the resident at the time of the visit.  We reviewed the resident's history and exam and pertinent patient test results.  I agree with the assessment, diagnosis, and plan of care documented in the resident's note.

## 2023-11-27 ENCOUNTER — Other Ambulatory Visit (HOSPITAL_BASED_OUTPATIENT_CLINIC_OR_DEPARTMENT_OTHER): Payer: Self-pay | Admitting: Student

## 2023-11-27 DIAGNOSIS — Z1231 Encounter for screening mammogram for malignant neoplasm of breast: Secondary | ICD-10-CM

## 2023-12-12 ENCOUNTER — Other Ambulatory Visit: Payer: Self-pay | Admitting: Student

## 2023-12-29 ENCOUNTER — Telehealth (INDEPENDENT_AMBULATORY_CARE_PROVIDER_SITE_OTHER): Payer: Self-pay | Admitting: *Deleted

## 2023-12-29 ENCOUNTER — Encounter (INDEPENDENT_AMBULATORY_CARE_PROVIDER_SITE_OTHER): Payer: Self-pay | Admitting: *Deleted

## 2023-12-29 NOTE — Telephone Encounter (Signed)
 Weight  ck

## 2023-12-30 ENCOUNTER — Ambulatory Visit (INDEPENDENT_AMBULATORY_CARE_PROVIDER_SITE_OTHER): Admitting: Plastic Surgery

## 2023-12-30 ENCOUNTER — Encounter: Payer: Self-pay | Admitting: Plastic Surgery

## 2023-12-30 VITALS — BP 130/91 | HR 95 | Ht 63.0 in | Wt 161.0 lb

## 2023-12-30 DIAGNOSIS — K429 Umbilical hernia without obstruction or gangrene: Secondary | ICD-10-CM

## 2023-12-30 DIAGNOSIS — L987 Excessive and redundant skin and subcutaneous tissue: Secondary | ICD-10-CM | POA: Diagnosis not present

## 2023-12-30 DIAGNOSIS — M25512 Pain in left shoulder: Secondary | ICD-10-CM

## 2023-12-30 DIAGNOSIS — R1030 Lower abdominal pain, unspecified: Secondary | ICD-10-CM

## 2023-12-30 DIAGNOSIS — Z9884 Bariatric surgery status: Secondary | ICD-10-CM

## 2023-12-30 NOTE — Progress Notes (Signed)
 Patient ID: Meagan Dean, female    DOB: 1970-06-14, 53 y.o.   MRN: [id number]   Chief Complaint  Patient presents with   Advice Only   Skin Problem    The patient is a lovely 53 year old female here for evaluation of her abdomen.  She is a trusted in a panniculectomy.  She has been very diligent about weight loss.  She had a lap band in 2010.  She has also had hysterectomy and appendectomy and fibroids removed.  She had endometriosis which is now under control.  She has had 2 bowel obstructions as well.  She is aware of 2 hernias and is seeing Dr. Stevie at [facility].  She is concerned about the excess weight and the rashes.  She is 161 pounds now and is 5 feet 3 inches tall.  She is not a smoker and does not have diabetes.  Her last hemoglobin A1c from 2 years ago was 5.3.     Review of Systems  Constitutional:  Positive for activity change. Negative for appetite change.  Eyes: Negative.   Respiratory: Negative.    Cardiovascular: Negative.   Gastrointestinal: Negative.   Endocrine: Negative.   Genitourinary: Negative.   Musculoskeletal:  Positive for back pain and neck pain.  Skin:  Positive for rash.  Hematological: Negative.     Past Medical History:  Diagnosis Date   Anemia    ASCUS with positive high risk HPV cervical 03/15/2017   Asthma due to seasonal allergies    MILD-- JUST GOT INHALER-not used yet -allergy induced   Calculus of ureter 06/16/2013   Endometriosis of pelvis    Fibroids 09/02/2017   GERD (gastroesophageal reflux disease)    diet controlled   H/O hiatal hernia    Headache    MIGRAINES - none since weight loss surgery   History of endometriosis 03/28/2017   History of hypertension    NO ISSUES SINCE WT LOSS AFTER GASTRIC BANDING   History of kidney stones    surgery to remove   History of obstructive sleep apnea    DX'D PRIOR TO GASTRIC BANDING  2010 --  NO ISSUES SINCE WT LOSS   Hypertension 03/28/2017   Low potassium syndrome     hopsitalized for 3 days   Right ureteral stone    SBO (small bowel obstruction) (HCC) 07/26/2021   Seasonal allergies    Sinus headache    Sleep apnea    prior to weight loss surgery, does not have cpap   Urgency of urination    UTI (urinary tract infection), bacterial 04/24/2022    Past Surgical History:  Procedure Laterality Date   ABDOMINAL HYSTERECTOMY     APPENDECTOMY     bowel obstruction  2017   CYSTOSCOPY WITH RETROGRADE PYELOGRAM, URETEROSCOPY AND STENT PLACEMENT Right 06/18/2013   Procedure: CYSTOSCOPY WITH RETROGRADE PYELOGRAM, URETEROSCOPY AND STENT PLACEMENT;  Surgeon: Garnette Shack, MD;  Location: [facility];  Service: Urology;  Laterality: Right;   CYSTOSCOPY WITH RETROGRADE PYELOGRAM, URETEROSCOPY AND STENT PLACEMENT Right 07/05/2013   Procedure: CYSTOSCOPY , URETEROSCOPY ANDstone extraction;  Surgeon: Garnette Shack, MD;  Location: [facility];  Service: Urology;  Laterality: Right;   EXPLORATORY LAPARTOMY/  MYOMECTOMY  2005   HYSTERECTOMY ABDOMINAL WITH SALPINGECTOMY Bilateral 09/02/2017   Procedure: HYSTERECTOMY ABDOMINAL WITH SALPINGECTOMY;  Surgeon: Corene Coy, MD;  Location: [facility];  Service: Gynecology;  Laterality: Bilateral;   LAPAROSCOPIC APPENDECTOMY N/A 07/28/2014   Procedure: APPENDECTOMY LAPAROSCOPIC;  Surgeon: Krystal  [name], MD;  Location: [facility];  Service: General;  Laterality: N/A;   LAPAROSCOPIC GASTRIC BANDING  06-06-2008   LAPAROSCOPY /  OVARIAN CYSTECTOMY/  LASER ABLATION ENDOMETRIOSIS  2003   LAPAROTOMY N/A 11/24/2015   Procedure: EXPLORATORY LAPAROTOMY;  Surgeon: Dann Hummer, MD;  Location: [facility];  Service: General;  Laterality: N/A;   LYSIS OF ADHESION N/A 11/24/2015   Procedure: LYSIS OF ADHESION;  Surgeon: Dann Hummer, MD;  Location: [facility] OR;  Service: General;  Laterality: N/A;   LYSIS OF ADHESION N/A 09/02/2017   Procedure: LYSIS OF ADHESION;  Surgeon: Corene Coy, MD;  Location: [facility];  Service:  Gynecology;  Laterality: N/A;   wisdom teeth ext        Current Outpatient Medications:    acetaminophen  (TYLENOL ) 500 MG tablet, Take 2 tablets (1,000 mg total) by mouth every 6 (six) hours., Disp: 30 tablet, Rfl: 0   albuterol  (VENTOLIN  HFA) 108 (90 Base) MCG/ACT inhaler, Inhale 2 puffs into the lungs., Disp: , Rfl:    gabapentin  (NEURONTIN ) 600 MG tablet, Take 0.5 tablets (300 mg total) by mouth 3 (three) times daily., Disp: 30 tablet, Rfl: 0   ipratropium-albuterol  (DUONEB) 0.5-2.5 (3) MG/3ML SOLN, Take 3 mLs by nebulization every 6 (six) hours as needed., Disp: 360 mL, Rfl: 0   loratadine  (CLARITIN ) 10 MG tablet, Take 10 mg by mouth daily., Disp: , Rfl:    meloxicam  (MOBIC ) 15 MG tablet, Take 15 mg by mouth daily., Disp: , Rfl:    ondansetron  (ZOFRAN -ODT) 4 MG disintegrating tablet, Take 1 tablet (4 mg total) by mouth every 8 (eight) hours as needed for nausea or vomiting., Disp: 20 tablet, Rfl: 0   potassium chloride  SA (KLOR-CON  M20) 20 MEQ tablet, Take 1 tablet (20 mEq total) by mouth daily., Disp: 30 tablet, Rfl: 3   Probiotic Product (PROBIOTIC PO), Take 1 capsule by mouth daily., Disp: , Rfl:    losartan  (COZAAR ) 25 MG tablet, Take 1 tablet (25 mg total) by mouth daily., Disp: 30 tablet, Rfl: 11   spironolactone  (ALDACTONE ) 25 MG tablet, TAKE 1 TABLET (25 MG TOTAL) BY MOUTH DAILY., Disp: 90 tablet, Rfl: 3   Objective:   Vitals:   12/30/23 1031  BP: (!) 130/91  Pulse: 95  SpO2: 99%    Physical Exam Vitals reviewed.  Constitutional:      Appearance: Normal appearance.  Cardiovascular:     Rate and Rhythm: Normal rate.     Pulses: Normal pulses.  Pulmonary:     Effort: Pulmonary effort is normal.  Abdominal:     General: There is no distension.     Palpations: Abdomen is soft.  Skin:    General: Skin is warm.     Capillary Refill: Capillary refill takes less than 2 seconds.  Neurological:     Mental Status: She is alert and oriented to person, place, and time.   Psychiatric:        Mood and Affect: Mood normal.        Behavior: Behavior normal.        Thought Content: Thought content normal.        Judgment: Judgment normal.     Assessment & Plan:  Acute pain of left shoulder  Lower abdominal pain  Umbilical hernia without obstruction and without gangrene  I reviewed the difference between a panniculectomy and abdominoplasty.  The patient is interested in pursuing surgery.  We will try to coordinate with Dr. Stevie for hernia repair at the same time.  She is considering the upper abdomen.  Pictures were obtained of the patient and placed in the chart with the patient's or guardian's permission.  Estefana RAMAN Jazmina Muhlenkamp, DO

## 2024-01-05 ENCOUNTER — Telehealth: Payer: Self-pay | Admitting: Plastic Surgery

## 2024-01-05 NOTE — Telephone Encounter (Signed)
 Patient would like to proceed with the panniculectomy and hold off on the cosmetic portion

## 2024-01-15 ENCOUNTER — Inpatient Hospital Stay: Admission: RE | Admit: 2024-01-15 | Source: Ambulatory Visit

## 2024-01-23 ENCOUNTER — Ambulatory Visit
Admission: RE | Admit: 2024-01-23 | Discharge: 2024-01-23 | Disposition: A | Discharge status: Home / Self Care | Source: Ambulatory Visit | Attending: Internal Medicine | Admitting: Internal Medicine

## 2024-01-23 DIAGNOSIS — Z1231 Encounter for screening mammogram for malignant neoplasm of breast: Secondary | ICD-10-CM

## 2024-01-30 ENCOUNTER — Encounter: Payer: Self-pay | Admitting: Student

## 2024-02-03 ENCOUNTER — Other Ambulatory Visit: Payer: Self-pay | Admitting: Medical Genetics

## 2024-02-10 ENCOUNTER — Ambulatory Visit: Payer: Self-pay | Admitting: Student

## 2024-02-17 ENCOUNTER — Ambulatory Visit: Payer: Self-pay

## 2024-02-17 NOTE — Telephone Encounter (Signed)
 FYI Only or Action Required?: FYI only for provider.  Patient was last seen in primary care on 11/04/2023 by Norrine Sharper, MD.  Called Nurse Triage reporting Urinary Frequency.  Symptoms began several weeks ago.  Interventions attempted: Nothing.  Symptoms are: gradually worsening.  Triage Disposition: See Physician Within 24 Hours  Patient/caregiver understands and will follow disposition?: Yes Reason for Disposition  Blood in urine (red, pink, or tea-colored)  Answer Assessment - Initial Assessment Questions C/o really bad frequent urination, and over all not feeling well. Next available appointment tomorrow, advised UC for symptoms and to get treatment going today, patient stated she cannot do that due to the out of pocket copay there. Scheduled with Dr. Jolaine 10/8  1. SEVERITY: How bad is the pain?  (e.g., Scale 1-10; mild, moderate, or severe)     Some pain  2. FREQUENCY: How many times have you had painful urination today?      Every time going to the bathroom   3. PATTERN: Is pain present every time you urinate or just sometimes?      Yes  4. ONSET: When did the painful urination start?      Not last week, but the week before  5. FEVER: Do you have a fever? If Yes, ask: What is your temperature, how was it measured, and when did it start?     No  7. CAUSE: What do you think is causing the painful urination?  (e.g., UTI, scratch, Herpes sore)     UTI  8. OTHER SYMPTOMS: Do you have any other symptoms? (e.g., blood in urine, flank pain, genital sores, urgency, vaginal discharge)     Last Wednesday, bright red mixed in, noticed it everyday since then. Noticing blood when wiping  9. PREGNANCY: Is there any chance you are pregnant? When was your last menstrual period?     N/A  Protocols used: Urination Pain - Female-A-AH  Copied from CRM [id number]. Topic: Clinical - Red Word Triage >> Feb 17, 2024 10:20 AM [name] wrote: Red Word that  prompted transfer to Nurse Triage: Patient urinating blood with mild to moderate pain when urinating. Patient is also experiencing frequent urination.

## 2024-02-17 NOTE — Telephone Encounter (Signed)
 Pt has an appt tomorrow 10/8.

## 2024-02-18 ENCOUNTER — Ambulatory Visit: Payer: Self-pay | Admitting: Student

## 2024-02-18 VITALS — BP 135/97 | HR 77 | Temp 98.6°F | Ht 63.0 in | Wt 160.2 lb

## 2024-02-18 DIAGNOSIS — R300 Dysuria: Secondary | ICD-10-CM

## 2024-02-18 DIAGNOSIS — E876 Hypokalemia: Secondary | ICD-10-CM | POA: Diagnosis not present

## 2024-02-18 DIAGNOSIS — R310 Gross hematuria: Secondary | ICD-10-CM

## 2024-02-18 DIAGNOSIS — Z79899 Other long term (current) drug therapy: Secondary | ICD-10-CM

## 2024-02-18 DIAGNOSIS — I10 Essential (primary) hypertension: Secondary | ICD-10-CM | POA: Diagnosis not present

## 2024-02-18 LAB — POCT URINALYSIS DIPSTICK
Bilirubin, UA: NEGATIVE
Glucose, UA: NEGATIVE
Ketones, UA: NEGATIVE
Nitrite, UA: NEGATIVE
Protein, UA: POSITIVE — AB
Spec Grav, UA: 1.025 (ref 1.010–1.025)
Urobilinogen, UA: 0.2 U/dL
pH, UA: 7 (ref 5.0–8.0)

## 2024-02-18 MED ORDER — LOSARTAN POTASSIUM 25 MG PO TABS: 25.0000 mg | ORAL_TABLET | Freq: Every day | ORAL | 3 refills | Status: AC

## 2024-02-18 MED ORDER — SULFAMETHOXAZOLE-TRIMETHOPRIM 800-160 MG PO TABS: 1.0000 | ORAL_TABLET | Freq: Two times a day (BID) | ORAL | 0 refills | Status: AC

## 2024-02-18 NOTE — Progress Notes (Signed)
 CC: Acute Concern of urinary frequency and dysuria  HPI:  Meagan Dean is a 53 y.o. female with pertinent PMH of hypertension, asthma, and recurrent urinary tract infections and kidney stones who presents as above. Please see assessment and plan below for further details.  Medications: Current Outpatient Medications  Medication Instructions   acetaminophen  (TYLENOL ) 1,000 mg, Oral, Every 6 hours   albuterol  (VENTOLIN  HFA) 108 (90 Base) MCG/ACT inhaler 2 puffs   ipratropium-albuterol  (DUONEB) 0.5-2.5 (3) MG/3ML SOLN 3 mLs, Nebulization, Every 6 hours PRN   loratadine  (CLARITIN ) 10 mg, Daily   losartan  (COZAAR ) 25 mg, Oral, Daily   meloxicam  (MOBIC ) 15 mg, Daily   ondansetron  (ZOFRAN -ODT) 4 mg, Oral, Every 8 hours PRN   potassium chloride  SA (KLOR-CON  M20) 20 MEQ tablet 20 mEq, Oral, Daily   Probiotic Product (PROBIOTIC PO) 1 capsule, Daily   spironolactone  (ALDACTONE ) 25 mg, Oral, Daily   sulfamethoxazole -trimethoprim  (BACTRIM  DS) 800-160 MG tablet 1 tablet, Oral, 2 times daily     Review of Systems:   Pertinent items noted in HPI and/or A&P.  Physical Exam:  Vitals:   02/18/24 1322 02/18/24 1340  BP: (!) 145/87 (!) 135/97  Pulse: 98 77  Temp: 98.6 F (37 C)   TempSrc: Oral   SpO2: 98%   Weight: 160 lb 3.2 oz (72.7 kg)   Height: 5' 3 (1.6 m)     Constitutional: Well-appearing adult female. In no acute distress. HEENT: Normocephalic, atraumatic, Sclera non-icteric, PERRL, EOM intact Cardio:Regular rate and rhythm. 2+ bilateral radial pulses. Pulm:Clear to auscultation bilaterally. Normal work of breathing on room air. Abdomen: Soft, mild suprapubic tenderness, non-distended, positive bowel sounds.  No CVA tenderness FDX:Wzhjupcz for extremity edema. Skin:Warm and dry. Neuro:Alert and oriented x3. No focal deficit noted. Psych:Pleasant mood and affect.   Assessment & Plan:   Assessment & Plan Gross hematuria Dysuria Patient presents with several days of  urinary frequency, dysuria, intermittent left flank pain, and gross hematuria without blood clots.  She has had a history of multiple urinary tract infections with 3 in the last 2 years.  Previously had Klebsiella pneumonia resistant to ampicillin and intermediate for nitrofurantoin  that was treated with Bactrim  in 10/2023 and then went to urgent care in 12/2023 and was treated with nitrofurantoin  but we do not have any culture data from this.  Otherwise urine culture from 06/2022 grew staph blood genesis resistant to oxacillin and pansensitive staph lugdunensis in 04/2022.  She does report that she has had gross hematuria with her previous urinary tract infections.  She went to the ED in 11/2023 with generalized abdominal pain and urinalysis at that visit was bland and CT of the abdomen pelvis with contrast showed no genitourinary abnormalities but this could obstructive stone due to the contrast.  She has a reported history of nephrolithiasis.  On exam she does not have any CVA tenderness but does have mild suprapubic tenderness/reproduction of urgency.  Considered the possibility of vaginal/uterine bleeding but she had a hysterectomy with removal of the cervix in 2019 and outside of urinary episodes she is not have any blood in her underwear and considering other GU infections does not have pathologic vaginal discharge or vaginal pain or itching.  Discussed empiric treatment with Bactrim  of her likely current UTI with dip urine today showing a small amount of blood, protein, and large amount of leukocytes.  Formal urinalysis showed leukocytes, many WBCs, dip positive for blood but no RBCs on microscopy, and hyaline casts and calcium oxalate  crystals without epithelial cells.  Culture is pending. - Follow urinary culture - Start Bactrim  DS twice daily for 3 days with return precautions - After resolution of current UTI or if culture is negative would recommend repeat urinalysis, discussion of continued gross  hematuria, CT urography/stone protocol, and urology referral for both recurrent urinary tract infections and gross hematuria Hypertension, unspecified type Blood pressure today slightly elevated at 135/97.  She has run out of her medications and was unable to fill them due to price but now is able to get them filled.  We discussed that she should have refills of the spironolactone  and I have sent in refills for the losartan .  BMP does show mild hypokalemia as below which is overall stable from prior. - Resume losartan  25 mg daily and spironolactone  25 mg daily Hypokalemia Patient has a history of hypokalemia and has been out of her medications as above.  BMP today shows potassium of 3.0 but she will be resuming her MRA/ARB and has continued her potassium supplements. - Continue potassium chloride  20 mEq daily  Orders Placed This Encounter  Procedures   Culture, Urine   Microscopic Examination   Basic metabolic panel with GFR   Urinalysis, Complete (81001)   POCT Urinalysis Dipstick (18997)     Return in about 3 months (around 05/20/2024) for Routine Follow up for HTN .   Patient discussed with Dr. Ronnald Sergeant  Fairy Pool, DO [facility] Internal Medicine Resident PGY-3 Clinic Phone: [phone] Please contact the on call pager at [phone] for any urgent or emergent needs.

## 2024-02-18 NOTE — Patient Instructions (Signed)
 Thank you, [name], for allowing us  to provide your care today. Today we discussed . . .  > Possible UTI       - Your symptoms do sound like urinary tract infection and your urine here shows some evidence that you have an infection.  I would like to send your urine for a formal urinalysis and culture.  We will treat with Bactrim  800-160 mg twice daily for 3 days.  When I get the results of the urine culture I will give you a call but if your symptoms do not get better after the 3 days of antibiotics please call our clinic on Monday and we will discuss our next steps.  I have ordered the following labs for you:  Lab Orders         Culture, Urine         Basic metabolic panel with GFR         Urinalysis, Complete (81001)         POCT Urinalysis Dipstick (18997)      Follow up: 3 months    Remember:  Should you have any questions or concerns please call the internal medicine clinic at [phone].     Fairy Pool, DO [facility]

## 2024-02-19 LAB — BASIC METABOLIC PANEL WITH GFR
BUN/Creatinine Ratio: 11 (ref 9–23)
BUN: 10 mg/dL (ref 6–24)
CO2: 24 mmol/L (ref 20–29)
Calcium: 8.9 mg/dL (ref 8.7–10.2)
Chloride: 103 mmol/L (ref 96–106)
Creatinine, Ser: 0.95 mg/dL (ref 0.57–1.00)
Glucose: 76 mg/dL (ref 70–99)
Potassium: 3 mmol/L — ABNORMAL LOW (ref 3.5–5.2)
Sodium: 144 mmol/L (ref 134–144)
eGFR: 72 mL/min/1.73 (ref >59)

## 2024-02-19 LAB — MICROSCOPIC EXAMINATION
RBC, Urine: NONE SEEN /HPF (ref 0–2)
WBC, UA: >30 /HPF — AB (ref 0–5)

## 2024-02-19 LAB — URINALYSIS, COMPLETE
Bilirubin, UA: NEGATIVE
Glucose, UA: NEGATIVE
Nitrite, UA: NEGATIVE
Specific Gravity, UA: 1.022 (ref 1.005–1.030)
Urobilinogen, Ur: 0.2 mg/dL (ref 0.2–1.0)
pH, UA: 7.5 (ref 5.0–7.5)

## 2024-02-21 NOTE — Assessment & Plan Note (Signed)
 Blood pressure today slightly elevated at 135/97.  She has run out of her medications and was unable to fill them due to price but now is able to get them filled.  We discussed that she should have refills of the spironolactone  and I have sent in refills for the losartan .  BMP does show mild hypokalemia as below which is overall stable from prior. - Resume losartan  25 mg daily and spironolactone  25 mg daily

## 2024-02-21 NOTE — Assessment & Plan Note (Signed)
 Patient has a history of hypokalemia and has been out of her medications as above.  BMP today shows potassium of 3.0 but she will be resuming her MRA/ARB and has continued her potassium supplements. - Continue potassium chloride  20 mEq daily

## 2024-02-23 LAB — URINE CULTURE

## 2024-02-24 ENCOUNTER — Other Ambulatory Visit: Payer: Self-pay

## 2024-02-24 ENCOUNTER — Ambulatory Visit (INDEPENDENT_AMBULATORY_CARE_PROVIDER_SITE_OTHER): Payer: Self-pay | Admitting: Student

## 2024-02-24 ENCOUNTER — Other Ambulatory Visit (HOSPITAL_COMMUNITY)
Admission: RE | Admit: 2024-02-24 | Discharge: 2024-02-24 | Disposition: A | Discharge status: Home / Self Care | Source: Ambulatory Visit | Attending: Internal Medicine | Admitting: Internal Medicine

## 2024-02-24 ENCOUNTER — Encounter: Payer: Self-pay | Admitting: Student

## 2024-02-24 VITALS — BP 118/78 | HR 101 | Temp 98.2°F | Ht 62.0 in | Wt 162.8 lb

## 2024-02-24 DIAGNOSIS — R300 Dysuria: Secondary | ICD-10-CM

## 2024-02-24 DIAGNOSIS — Z8619 Personal history of other infectious and parasitic diseases: Secondary | ICD-10-CM | POA: Diagnosis not present

## 2024-02-24 DIAGNOSIS — Z87440 Personal history of urinary (tract) infections: Secondary | ICD-10-CM

## 2024-02-24 DIAGNOSIS — E876 Hypokalemia: Secondary | ICD-10-CM

## 2024-02-24 LAB — POCT URINALYSIS DIPSTICK
Bilirubin, UA: NEGATIVE
Blood, UA: NEGATIVE
Glucose, UA: NEGATIVE
Leukocytes, UA: NEGATIVE
Nitrite, UA: NEGATIVE
Protein, UA: POSITIVE — AB
Spec Grav, UA: 1.025 (ref 1.010–1.025)
Urobilinogen, UA: 0.2 U/dL
pH, UA: 6 (ref 5.0–8.0)

## 2024-02-24 MED ORDER — SPIRONOLACTONE 25 MG PO TABS: 25.0000 mg | ORAL_TABLET | Freq: Every day | ORAL | 3 refills | Status: AC

## 2024-02-24 NOTE — Patient Instructions (Signed)
 Thank you, [name], for allowing us  to provide your care today. Today we discussed . . .  > UTI and continue discharge       - I will let you know when we have the results back for the swab.  Please continue to stay well-hydrated and let us  know if you have any worsening or new symptoms.  I have ordered the following labs for you:  Lab Orders         POCT Urinalysis Dipstick (18997)       Follow up: 3 months    Remember:  Should you have any questions or concerns please call the internal medicine clinic at [phone].     Fairy Pool, DO [facility]

## 2024-02-24 NOTE — Progress Notes (Signed)
   CC: Acute Concern of continued UTI symptoms  HPI:  Meagan Dean is a 53 y.o. female with pertinent PMH of hypertension, asthma, and recurrent urinary tract infections and kidney stones  who presents as above. Please see assessment and plan below for further details.  Medications: Current Outpatient Medications  Medication Instructions   acetaminophen  (TYLENOL ) 1,000 mg, Oral, Every 6 hours   albuterol  (VENTOLIN  HFA) 108 (90 Base) MCG/ACT inhaler 2 puffs   ipratropium-albuterol  (DUONEB) 0.5-2.5 (3) MG/3ML SOLN 3 mLs, Nebulization, Every 6 hours PRN   loratadine  (CLARITIN ) 10 mg, Daily   losartan  (COZAAR ) 25 mg, Oral, Daily   meloxicam  (MOBIC ) 15 mg, Daily   ondansetron  (ZOFRAN -ODT) 4 mg, Oral, Every 8 hours PRN   potassium chloride  SA (KLOR-CON  M20) 20 MEQ tablet 20 mEq, Oral, Daily   Probiotic Product (PROBIOTIC PO) 1 capsule, Daily   spironolactone  (ALDACTONE ) 25 mg, Oral, Daily     Review of Systems:   Pertinent items noted in HPI and/or A&P.  Physical Exam:  Vitals:   02/24/24 1538  BP: 118/78  Pulse: (!) 101  Temp: 98.2 F (36.8 C)  TempSrc: Oral  SpO2: 100%  Weight: 162 lb 12.8 oz (73.8 kg)  Height: 5' 2 (1.575 m)    Constitutional: Well-appearing adult female. In no acute distress. Pulm: Normal work of breathing on room air. Neuro:Alert and oriented x3. No focal deficit noted. Psych:Pleasant mood and affect.   Assessment & Plan:   Assessment & Plan Dysuria Presents as a 1 week follow-up for ongoing UTI symptoms after being treated with 3 days of Bactrim  for Staph saprophyticus UTI last week.  She continues to have dysuria (although improved today), urinary frequency, and clear vaginal discharge.  She is in a monogamous relationship and does not have a recent history of vaginal infections.  Declined pelvic exam today.  No pelvic pain, fever, return of bloody urine or any new GU bleeding.  Of note urinalysis did not show any microscopic hematuria so we will  hold off on further evaluation for renal stones and urology referral at this time.  Urinalysis dip today showed continued proteinuria but no blood, leukocytes, or nitrites.  Low suspicion for recurrent UTI at this time but due to ongoing vaginal discharge we will continue with self swab.  Also encouraged to keep up her hydration as this has made her symptoms better overall.  She also does have some continued proteinuria but this is mostly in the setting of urinary tract infections or very close to them and in between them has had normal urinalysis so whenever she is back to her baseline we will repeat a urinalysis at that time to assess for baseline proteinuria. - Self swab for GC/chlamydia, trichomonas, Candida, and BV  Orders Placed This Encounter  Procedures   POCT Urinalysis Dipstick (18997)     Return in about 3 months (around 05/26/2024) for Routine follow-up.   Patient discussed with Dr. [place] [facility]  Fairy Pool, DO [facility] Internal Medicine Resident PGY-3 Clinic Phone: [phone] Please contact the on call pager at [phone] for any urgent or emergent needs.

## 2024-02-25 ENCOUNTER — Ambulatory Visit: Payer: Self-pay | Admitting: Student

## 2024-02-25 DIAGNOSIS — B9689 Other specified bacterial agents as the cause of diseases classified elsewhere: Secondary | ICD-10-CM

## 2024-02-25 LAB — CERVICOVAGINAL ANCILLARY ONLY
Bacterial Vaginitis (gardnerella): POSITIVE — AB
Candida Glabrata: NEGATIVE
Candida Vaginitis: POSITIVE — AB
Chlamydia: NEGATIVE
Comment: NEGATIVE
Comment: NEGATIVE
Comment: NEGATIVE
Comment: NEGATIVE
Comment: NEGATIVE
Comment: NORMAL
Neisseria Gonorrhea: NEGATIVE
Trichomonas: POSITIVE — AB

## 2024-02-25 MED ORDER — METRONIDAZOLE 500 MG PO TABS: ORAL_TABLET | ORAL | 0 refills | Status: DC

## 2024-02-25 NOTE — Progress Notes (Signed)
 Internal Medicine Clinic Attending  Case discussed with the resident at the time of the visit.  We reviewed the resident's history and exam and pertinent patient test results.  I agree with the assessment, diagnosis, and plan of care documented in the resident's note.

## 2024-02-25 NOTE — Progress Notes (Signed)
 Cervical vaginal swab positive for trichomonas, Gardnerella, and Candida vaginitis.  We will treat for trichomonas and Gardnerella and if symptoms persist treat for Candida after this.  Discussed with patient over the phone in addition to treating herself we would need to treat her partner.  7 days of metronidazole  500 mg twice daily sent to the pharmacy.

## 2024-02-26 ENCOUNTER — Other Ambulatory Visit

## 2024-02-26 DIAGNOSIS — Z006 Encounter for examination for normal comparison and control in clinical research program: Secondary | ICD-10-CM

## 2024-03-03 NOTE — Progress Notes (Signed)
 Internal Medicine Clinic Attending  Case discussed with the resident at the time of the visit.  We reviewed the resident's history and exam and pertinent patient test results.  I agree with the assessment, diagnosis, and plan of care documented in the resident's note.

## 2024-03-05 LAB — GENECONNECT MOLECULAR SCREEN: Genetic Analysis Overall Interpretation: NEGATIVE

## 2024-03-16 ENCOUNTER — Other Ambulatory Visit: Payer: Self-pay | Admitting: Student

## 2024-03-16 DIAGNOSIS — E876 Hypokalemia: Secondary | ICD-10-CM

## 2024-03-16 NOTE — Telephone Encounter (Signed)
 Medication sent to pharmacy

## 2024-05-14 ENCOUNTER — Ambulatory Visit (INDEPENDENT_AMBULATORY_CARE_PROVIDER_SITE_OTHER): Admitting: Student

## 2024-05-14 ENCOUNTER — Encounter: Payer: Self-pay | Admitting: Student

## 2024-05-14 VITALS — BP 122/82 | HR 83 | Ht 62.0 in | Wt 165.0 lb

## 2024-05-14 DIAGNOSIS — R1030 Lower abdominal pain, unspecified: Secondary | ICD-10-CM

## 2024-05-14 DIAGNOSIS — K429 Umbilical hernia without obstruction or gangrene: Secondary | ICD-10-CM

## 2024-05-14 DIAGNOSIS — M793 Panniculitis, unspecified: Secondary | ICD-10-CM | POA: Diagnosis not present

## 2024-05-14 DIAGNOSIS — Z719 Counseling, unspecified: Secondary | ICD-10-CM

## 2024-05-14 MED ORDER — ONDANSETRON HCL 4 MG PO TABS: 4.0000 mg | ORAL_TABLET | Freq: Three times a day (TID) | ORAL | 0 refills | Status: AC | PRN

## 2024-05-14 MED ORDER — OXYCODONE HCL 5 MG PO TABS: 5.0000 mg | ORAL_TABLET | Freq: Four times a day (QID) | ORAL | 0 refills | Status: AC | PRN

## 2024-05-14 MED ORDER — CEPHALEXIN 500 MG PO CAPS: 500.0000 mg | ORAL_CAPSULE | Freq: Four times a day (QID) | ORAL | 0 refills | Status: AC

## 2024-05-14 NOTE — Progress Notes (Signed)
 "    Patient ID: Meagan Dean, female    DOB: 11-10-1970, 54 y.o.   MRN: [id number]  Chief Complaint  Patient presents with   Pre-op Exam      ICD-10-CM   1. Lower abdominal pain  R10.30     2. Umbilical hernia without obstruction and without gangrene  K42.9        History of Present Illness: Meagan Dean is a 54 y.o.  female  with a history of panniculitis.  She presents for preoperative evaluation for upcoming procedure, panniculectomy, scheduled for 05/27/2024 with Dr. Lowery.  Of note, patient will also be undergoing repair of hernia with Dr. Stevie at the same time.  The patient has had problems with anesthesia.  Patient states that she has had issues waking up from anesthesia after several surgeries.  She denies any other issues with anesthesia.  Patient denies any history of cardiac disease.  She does state that she has issues with her potassium and takes potassium pills for this.  She states that her most recent potassium was in October and was slightly low.  Per chart review, her potassium was 3.0 on 02/18/2024.  It appears that her highest potassium in the past year or so was 3.4, and her potassium 6 months ago was 2.9.  Patient denies taking any blood thinners.  Patient reports she is not a smoker.  Patient denies taking any birth control or hormone replacement.  She denies any history of greater than 3 miscarriages.  She denies any personal history of blood clots.  She does report that she has had 2 cousins with history of blood clots, one of them passing away from a PE.  Patient denies any personal or family history of clotting diseases.  She denies any recent surgeries, traumas or infections.  She does report she had a possible TIA in 2008 versus migraine.  She denies any history of heart attack.  She denies any history of Crohn's disease or ulcerative colitis.  She denies any history of COPD.  Patient does report she has asthma, she states that it is well-controlled and it  is allergy induced.  She denies any history of cancer.  She denies any varicosities to her lower extremities.  She denies any recent fevers, chills or changes in her health.  Patient reports that she used to have sleep apnea, but since weight loss, her sleep apnea has resolved and she does not wear CPAP at night.  Patient does report a latex allergy.  Summary of Previous Visit: Patient was seen for initial consult by Dr. Lowery on 12/30/2023.  At this visit, patient reported she had a lap band placed in 2010 and had been very diligent about her weight loss.  She reported that she had 2 hernias and was seeing [facility]. Stevie at [facility] about this.  Plan was to move forward with surgery with Dr. Patrici for hernia repair at the same time.  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  She understands that she will have restrictions from our standpoint for 6 weeks.  I discussed with the patient that she will also need to check with general surgery to see what her restrictions are from their standpoint.  She expressed understanding.  PMH Significant for: Hypertension, asthma, history of small bowel obstruction, hypokalemia, panniculitis   Past Medical  History: Allergies: Allergies[1]  Current Medications: Current Medications[2]  Past Medical Problems: Past Medical History:  Diagnosis Date   Anemia    ASCUS with positive high risk HPV cervical 03/15/2017   Asthma due to seasonal allergies    MILD-- JUST GOT INHALER-not used yet -allergy induced   Calculus of ureter 06/16/2013   Endometriosis of pelvis    Fibroids 09/02/2017   GERD (gastroesophageal reflux disease)    diet controlled   H/O hiatal hernia    Headache    MIGRAINES - none since weight loss surgery   History of endometriosis 03/28/2017   History of hypertension     NO ISSUES SINCE WT LOSS AFTER GASTRIC BANDING   History of kidney stones    surgery to remove   History of obstructive sleep apnea    DX'D PRIOR TO GASTRIC BANDING  2010 --  NO ISSUES SINCE WT LOSS   Hypertension 03/28/2017   Low potassium syndrome    hopsitalized for 3 days   Right ureteral stone    SBO (small bowel obstruction) (HCC) 07/26/2021   Seasonal allergies    Sinus headache    Sleep apnea    prior to weight loss surgery, does not have cpap   Urgency of urination    UTI (urinary tract infection), bacterial 04/24/2022    Past Surgical History: Past Surgical History:  Procedure Laterality Date   ABDOMINAL HYSTERECTOMY     APPENDECTOMY     bowel obstruction  2017   CYSTOSCOPY WITH RETROGRADE PYELOGRAM, URETEROSCOPY AND STENT PLACEMENT Right 06/18/2013   Procedure: CYSTOSCOPY WITH RETROGRADE PYELOGRAM, URETEROSCOPY AND STENT PLACEMENT;  Surgeon: Garnette Shack, MD;  Location: [facility];  Service: Urology;  Laterality: Right;   CYSTOSCOPY WITH RETROGRADE PYELOGRAM, URETEROSCOPY AND STENT PLACEMENT Right 07/05/2013   Procedure: CYSTOSCOPY , URETEROSCOPY ANDstone extraction;  Surgeon: Garnette Shack, MD;  Location[facility];  Service: Urology;  Laterality: Right;   EXPLORATORY LAPARTOMY/  MYOMECTOMY  2005   HYSTERECTOMY ABDOMINAL WITH SALPINGECTOMY Bilateral 09/02/2017   Procedure: HYSTERECTOMY ABDOMINAL WITH SALPINGECTOMY;  Surgeon: Corene Coy, MD;  Location: [facility];  Service: Gynecology;  Laterality: Bilateral;   LAPAROSCOPIC APPENDECTOMY N/A 07/28/2014   Procedure: APPENDECTOMY LAPAROSCOPIC;  Surgeon: Krystal Russell, MD;  Location: [facility];  Service: General;  Laterality: N/A;   LAPAROSCOPIC GASTRIC BANDING  06-06-2008   LAPAROSCOPY /  OVARIAN CYSTECTOMY/  LASER ABLATION ENDOMETRIOSIS  2003   LAPAROTOMY N/A 11/24/2015   Procedure: EXPLORATORY LAPAROTOMY;  Surgeon: Dann Hummer, MD;  Location: [facility];  Service: General;  Laterality: N/A;    LYSIS OF ADHESION N/A 11/24/2015   Procedure: LYSIS OF ADHESION;  Surgeon: Dann Hummer, MD;  Location: [facility] OR;  Service: General;  Laterality: N/A;   LYSIS OF ADHESION N/A 09/02/2017   Procedure: LYSIS OF ADHESION;  Surgeon: Corene Coy, MD;  Location: [facility];  Service: Gynecology;  Laterality: N/A;   wisdom teeth ext      Social History: Social History   Socioeconomic History   Marital status: Single    Spouse name: Not on file   Number of children: Not on file   Years of education: Not on file   Highest education level: Not on file  Occupational History   Not on file  Tobacco Use   Smoking status: Never    Passive exposure: Current   Smokeless tobacco: Never  Vaping Use   Vaping status: Never Used  Substance and Sexual Activity   Alcohol use: Yes  Alcohol/week: 0.0 standard drinks of alcohol    Comment: occasionally   Drug use: No   Sexual activity: Not Currently    Birth control/protection: None  Other Topics Concern   Not on file  Social History Narrative   Partner was very stern with her and sneaky and wasn't afraid of him.   Was telling her to lose weight.    Social Drivers of Health   Tobacco Use: Medium Risk (05/14/2024)   Patient History    Smoking Tobacco Use: Never    Smokeless Tobacco Use: Never    Passive Exposure: Current  Financial Resource Strain: Medium Risk (02/18/2024)   Overall Financial Resource Strain (CARDIA)    Difficulty of Paying Living Expenses: Somewhat hard  Food Insecurity: No Food Insecurity (02/18/2024)   Epic    Worried About Programme Researcher, Broadcasting/film/video in the Last Year: Never true    Ran Out of Food in the Last Year: Never true  Transportation Needs: No Transportation Needs (01/17/2023)   Received from [facility]   PRAPARE - Transportation    Lack of Transportation (Medical): No    Lack of Transportation (Non-Medical): No  Physical Activity: Insufficiently Active (02/18/2024)   Exercise Vital Sign    Days of Exercise per  Week: 4 days    Minutes of Exercise per Session: 30 min  Stress: Stress Concern Present (02/18/2024)   [employer] of Occupational Health - Occupational Stress Questionnaire    Feeling of Stress: Rather much  Social Connections: Moderately Isolated (02/18/2024)   Social Connection and Isolation Panel    Frequency of Communication with Friends and Family: More than three times a week    Frequency of Social Gatherings with Friends and Family: More than three times a week    Attends Religious Services: More than 4 times per year    Active Member of Clubs or Organizations: No    Attends Banker Meetings: Never    Marital Status: Never married  Intimate Partner Violence: Not At Risk (02/18/2024)   Epic    Fear of Current or Ex-Partner: No    Emotionally Abused: No    Physically Abused: No    Sexually Abused: No  Depression (PHQ2-9): Low Risk (02/24/2024)   Depression (PHQ2-9)    PHQ-2 Score: 0  Alcohol Screen: Low Risk (02/18/2024)   Alcohol Screen    Last Alcohol Screening Score (AUDIT): 2  Housing: Unknown (12/12/2023)   Received from [facility]   Epic    Unable to Pay for Housing in the Last Year: Not on file    Number of Times Moved in the Last Year: Not on file    At any time in the past 12 months, were you homeless or living in a shelter (including now)?: No  Utilities: Not At Risk (02/18/2024)   Epic    Threatened with loss of utilities: No  Health Literacy: Adequate Health Literacy (02/18/2024)   B1300 Health Literacy    Frequency of need for help with medical instructions: Never    Family History: Family History  Problem Relation Age of Onset   Stroke Father    Breast cancer Sister 80   Colon cancer Paternal Aunt    Colon cancer Paternal Uncle    Prostate cancer Paternal Uncle    Prostate cancer Paternal Uncle    Prostate cancer Paternal Uncle    Colon cancer Maternal Grandmother    Liver cancer Maternal Grandmother    Breast cancer  Cousin 18 -  59   Breast cancer Cousin 50 - 59   Breast cancer Cousin 50 - 59   Breast cancer Cousin 50 - 59    Review of Systems: Denies any recent fevers, chills or changes in her health  Physical Exam: Vital Signs BP 122/82   Pulse 83   Ht 5' 2 (1.575 m)   Wt 165 lb (74.8 kg)   LMP  (LMP Unknown) Comment: 07/2016  SpO2 99%   BMI 30.18 kg/m   Physical Exam  Constitutional:      General: Not in acute distress.    Appearance: Normal appearance. Not ill-appearing.  HENT:     Head: Normocephalic and atraumatic.  Eyes:     Pupils: Pupils are equal, round Neck:     Musculoskeletal: Normal range of motion.  Cardiovascular:     Rate and Rhythm: Normal rate Pulmonary:     Effort: Pulmonary effort is normal. No respiratory distress.  Musculoskeletal: Normal range of motion.  Skin:    General: Skin is warm and dry.     Findings: No erythema or rash.  Neurological:     Mental Status: Alert and oriented to person, place, and time. Mental status is at baseline.  Psychiatric:        Mood and Affect: Mood normal.        Behavior: Behavior normal.    Assessment/Plan: The patient is scheduled for panniculectomy with Dr. Lowery.  Risks, benefits, and alternatives of procedure discussed, questions answered and consent obtained.    Will send clearance to patient's PCP.  Patient is requesting to stay the night after her surgery.  I discussed with her that I will discuss this with Dr. Lowery to see if this can be arranged.  We did discuss the possibility of her not being able to stay the night.  She expressed understanding.  Smoking Status: Non-smoker; Counseling Given?  N/A  Caprini Score: 7; Risk Factors include: Age, BMI > 25, family history of thrombosis and length of planned surgery. Recommendation for mechanical and possible pharmacological prophylaxis. Encourage early ambulation.  Will discuss possibility of postoperative Lovenox  with Dr. Lowery.  Pictures  obtained: @consult   Post-op Rx sent to pharmacy: Oxycodone , Zofran , Keflex   Instructed patient to hold her losartan  and spironolactone  on the day of surgery.  Discussed with her to hold her meloxicam  the week before surgery.  Patient expressed understanding.  Patient was provided with the General Surgical Risk consent document and Pain Medication Agreement prior to their appointment.  They had adequate time to read through the risk consent documents and Pain Medication Agreement. We also discussed them in person together during this preop appointment. All of their questions were answered to their satisfaction.  Recommended calling if they have any further questions.  Risk consent form and Pain Medication Agreement to be scanned into patient's chart.  The consent was obtained with risks and complications reviewed which included bleeding, pain, scar, infection and the risk of anesthesia.  The patients questions were answered to the patients expressed satisfaction.    Electronically signed by: Estefana FORBES Peck, PA-C 05/14/2024 10:34 AM  At 5 weeks.    [1]  Allergies Allergen Reactions   Latex Itching    Powder in latex gloves   Reglan  [Metoclopramide ] Other (See Comments)    Muscle twitching/lip twitch   Vicodin [Hydrocodone-Acetaminophen ] Other (See Comments)    Made her feel high  Has had dilaudid  before and tolerated it well  [2]  Current Outpatient Medications:  acetaminophen  (TYLENOL ) 500 MG tablet, Take 2 tablets (1,000 mg total) by mouth every 6 (six) hours., Disp: 30 tablet, Rfl: 0   albuterol  (VENTOLIN  HFA) 108 (90 Base) MCG/ACT inhaler, Inhale 2 puffs into the lungs., Disp: , Rfl:    cephALEXin  (KEFLEX ) 500 MG capsule, Take 1 capsule (500 mg total) by mouth 4 (four) times daily for 3 days., Disp: 12 capsule, Rfl: 0   ipratropium-albuterol  (DUONEB) 0.5-2.5 (3) MG/3ML SOLN, Take 3 mLs by nebulization every 6 (six) hours as needed., Disp: 360 mL, Rfl: 0   loratadine  (CLARITIN )  10 MG tablet, Take 10 mg by mouth daily., Disp: , Rfl:    losartan  (COZAAR ) 25 MG tablet, Take 1 tablet (25 mg total) by mouth daily., Disp: 90 tablet, Rfl: 3   meloxicam  (MOBIC ) 15 MG tablet, Take 15 mg by mouth daily., Disp: , Rfl:    ondansetron  (ZOFRAN ) 4 MG tablet, Take 1 tablet (4 mg total) by mouth every 8 (eight) hours as needed for up to 15 doses for nausea or vomiting., Disp: 15 tablet, Rfl: 0   ondansetron  (ZOFRAN -ODT) 4 MG disintegrating tablet, Take 1 tablet (4 mg total) by mouth every 8 (eight) hours as needed for nausea or vomiting., Disp: 20 tablet, Rfl: 0   oxyCODONE  (ROXICODONE ) 5 MG immediate release tablet, Take 1 tablet (5 mg total) by mouth every 6 (six) hours as needed for up to 15 doses for severe pain (pain score 7-10)., Disp: 15 tablet, Rfl: 0   potassium chloride  SA (KLOR-CON  M) 20 MEQ tablet, TAKE 1 TABLET BY MOUTH EVERY DAY, Disp: 90 tablet, Rfl: 1   Probiotic Product (PROBIOTIC PO), Take 1 capsule by mouth daily., Disp: , Rfl:    spironolactone  (ALDACTONE ) 25 MG tablet, Take 1 tablet (25 mg total) by mouth daily., Disp: 90 tablet, Rfl: 3  "

## 2024-05-14 NOTE — H&P (View-Only) (Signed)
 "    Patient ID: Meagan Dean, female    DOB: 11-10-1970, 54 y.o.   MRN: [id number]  Chief Complaint  Patient presents with   Pre-op Exam      ICD-10-CM   1. Lower abdominal pain  R10.30     2. Umbilical hernia without obstruction and without gangrene  K42.9        History of Present Illness: Meagan Dean is a 54 y.o.  female  with a history of panniculitis.  She presents for preoperative evaluation for upcoming procedure, panniculectomy, scheduled for 05/27/2024 with Dr. Lowery.  Of note, patient will also be undergoing repair of hernia with Dr. Stevie at the same time.  The patient has had problems with anesthesia.  Patient states that she has had issues waking up from anesthesia after several surgeries.  She denies any other issues with anesthesia.  Patient denies any history of cardiac disease.  She does state that she has issues with her potassium and takes potassium pills for this.  She states that her most recent potassium was in October and was slightly low.  Per chart review, her potassium was 3.0 on 02/18/2024.  It appears that her highest potassium in the past year or so was 3.4, and her potassium 6 months ago was 2.9.  Patient denies taking any blood thinners.  Patient reports she is not a smoker.  Patient denies taking any birth control or hormone replacement.  She denies any history of greater than 3 miscarriages.  She denies any personal history of blood clots.  She does report that she has had 2 cousins with history of blood clots, one of them passing away from a PE.  Patient denies any personal or family history of clotting diseases.  She denies any recent surgeries, traumas or infections.  She does report she had a possible TIA in 2008 versus migraine.  She denies any history of heart attack.  She denies any history of Crohn's disease or ulcerative colitis.  She denies any history of COPD.  Patient does report she has asthma, she states that it is well-controlled and it  is allergy induced.  She denies any history of cancer.  She denies any varicosities to her lower extremities.  She denies any recent fevers, chills or changes in her health.  Patient reports that she used to have sleep apnea, but since weight loss, her sleep apnea has resolved and she does not wear CPAP at night.  Patient does report a latex allergy.  Summary of Previous Visit: Patient was seen for initial consult by Dr. Lowery on 12/30/2023.  At this visit, patient reported she had a lap band placed in 2010 and had been very diligent about her weight loss.  She reported that she had 2 hernias and was seeing Dr. Stevie at [facility] about this.  Plan was to move forward with surgery with Dr. Patrici for hernia repair at the same time.  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  She understands that she will have restrictions from our standpoint for 6 weeks.  I discussed with the patient that she will also need to check with general surgery to see what her restrictions are from their standpoint.  She expressed understanding.  PMH Significant for: Hypertension, asthma, history of small bowel obstruction, hypokalemia, panniculitis   Past Medical  History: Allergies: Allergies[1]  Current Medications: Current Medications[2]  Past Medical Problems: Past Medical History:  Diagnosis Date   Anemia    ASCUS with positive high risk HPV cervical 03/15/2017   Asthma due to seasonal allergies    MILD-- JUST GOT INHALER-not used yet -allergy induced   Calculus of ureter 06/16/2013   Endometriosis of pelvis    Fibroids 09/02/2017   GERD (gastroesophageal reflux disease)    diet controlled   H/O hiatal hernia    Headache    MIGRAINES - none since weight loss surgery   History of endometriosis 03/28/2017   History of hypertension     NO ISSUES SINCE WT LOSS AFTER GASTRIC BANDING   History of kidney stones    surgery to remove   History of obstructive sleep apnea    DX'D PRIOR TO GASTRIC BANDING  2010 --  NO ISSUES SINCE WT LOSS   Hypertension 03/28/2017   Low potassium syndrome    hopsitalized for 3 days   Right ureteral stone    SBO (small bowel obstruction) (HCC) 07/26/2021   Seasonal allergies    Sinus headache    Sleep apnea    prior to weight loss surgery, does not have cpap   Urgency of urination    UTI (urinary tract infection), bacterial 04/24/2022    Past Surgical History: Past Surgical History:  Procedure Laterality Date   ABDOMINAL HYSTERECTOMY     APPENDECTOMY     bowel obstruction  2017   CYSTOSCOPY WITH RETROGRADE PYELOGRAM, URETEROSCOPY AND STENT PLACEMENT Right 06/18/2013   Procedure: CYSTOSCOPY WITH RETROGRADE PYELOGRAM, URETEROSCOPY AND STENT PLACEMENT;  Surgeon: Garnette Shack, MD;  Location: [facility];  Service: Urology;  Laterality: Right;   CYSTOSCOPY WITH RETROGRADE PYELOGRAM, URETEROSCOPY AND STENT PLACEMENT Right 07/05/2013   Procedure: CYSTOSCOPY , URETEROSCOPY ANDstone extraction;  Surgeon: Garnette Shack, MD;  Location[facility];  Service: Urology;  Laterality: Right;   EXPLORATORY LAPARTOMY/  MYOMECTOMY  2005   HYSTERECTOMY ABDOMINAL WITH SALPINGECTOMY Bilateral 09/02/2017   Procedure: HYSTERECTOMY ABDOMINAL WITH SALPINGECTOMY;  Surgeon: Corene Coy, MD;  Location: [facility];  Service: Gynecology;  Laterality: Bilateral;   LAPAROSCOPIC APPENDECTOMY N/A 07/28/2014   Procedure: APPENDECTOMY LAPAROSCOPIC;  Surgeon: Krystal Russell, MD;  Location: [facility];  Service: General;  Laterality: N/A;   LAPAROSCOPIC GASTRIC BANDING  06-06-2008   LAPAROSCOPY /  OVARIAN CYSTECTOMY/  LASER ABLATION ENDOMETRIOSIS  2003   LAPAROTOMY N/A 11/24/2015   Procedure: EXPLORATORY LAPAROTOMY;  Surgeon: Dann Hummer, MD;  Location: [facility];  Service: General;  Laterality: N/A;    LYSIS OF ADHESION N/A 11/24/2015   Procedure: LYSIS OF ADHESION;  Surgeon: Dann Hummer, MD;  Location: [facility] OR;  Service: General;  Laterality: N/A;   LYSIS OF ADHESION N/A 09/02/2017   Procedure: LYSIS OF ADHESION;  Surgeon: Corene Coy, MD;  Location: [facility];  Service: Gynecology;  Laterality: N/A;   wisdom teeth ext      Social History: Social History   Socioeconomic History   Marital status: Single    Spouse name: Not on file   Number of children: Not on file   Years of education: Not on file   Highest education level: Not on file  Occupational History   Not on file  Tobacco Use   Smoking status: Never    Passive exposure: Current   Smokeless tobacco: Never  Vaping Use   Vaping status: Never Used  Substance and Sexual Activity   Alcohol use: Yes  Alcohol/week: 0.0 standard drinks of alcohol    Comment: occasionally   Drug use: No   Sexual activity: Not Currently    Birth control/protection: None  Other Topics Concern   Not on file  Social History Narrative   Partner was very stern with her and sneaky and wasn't afraid of him.   Was telling her to lose weight.    Social Drivers of Health   Tobacco Use: Medium Risk (05/14/2024)   Patient History    Smoking Tobacco Use: Never    Smokeless Tobacco Use: Never    Passive Exposure: Current  Financial Resource Strain: Medium Risk (02/18/2024)   Overall Financial Resource Strain (CARDIA)    Difficulty of Paying Living Expenses: Somewhat hard  Food Insecurity: No Food Insecurity (02/18/2024)   Epic    Worried About Programme Researcher, Broadcasting/film/video in the Last Year: Never true    Ran Out of Food in the Last Year: Never true  Transportation Needs: No Transportation Needs (01/17/2023)   Received from [facility]   PRAPARE - Transportation    Lack of Transportation (Medical): No    Lack of Transportation (Non-Medical): No  Physical Activity: Insufficiently Active (02/18/2024)   Exercise Vital Sign    Days of Exercise per  Week: 4 days    Minutes of Exercise per Session: 30 min  Stress: Stress Concern Present (02/18/2024)   [employer] of Occupational Health - Occupational Stress Questionnaire    Feeling of Stress: Rather much  Social Connections: Moderately Isolated (02/18/2024)   Social Connection and Isolation Panel    Frequency of Communication with Friends and Family: More than three times a week    Frequency of Social Gatherings with Friends and Family: More than three times a week    Attends Religious Services: More than 4 times per year    Active Member of Clubs or Organizations: No    Attends Banker Meetings: Never    Marital Status: Never married  Intimate Partner Violence: Not At Risk (02/18/2024)   Epic    Fear of Current or Ex-Partner: No    Emotionally Abused: No    Physically Abused: No    Sexually Abused: No  Depression (PHQ2-9): Low Risk (02/24/2024)   Depression (PHQ2-9)    PHQ-2 Score: 0  Alcohol Screen: Low Risk (02/18/2024)   Alcohol Screen    Last Alcohol Screening Score (AUDIT): 2  Housing: Unknown (12/12/2023)   [facility] from [facility]   Epic    Unable to Pay for Housing in the Last Year: Not on file    Number of Times Moved in the Last Year: Not on file    At any time in the past 12 months, were you homeless or living in a shelter (including now)?: No  Utilities: Not At Risk (02/18/2024)   Epic    Threatened with loss of utilities: No  Health Literacy: Adequate Health Literacy (02/18/2024)   B1300 Health Literacy    Frequency of need for help with medical instructions: Never    Family History: Family History  Problem Relation Age of Onset   Stroke Father    Breast cancer Sister 80   Colon cancer Paternal Aunt    Colon cancer Paternal Uncle    Prostate cancer Paternal Uncle    Prostate cancer Paternal Uncle    Prostate cancer Paternal Uncle    Colon cancer Maternal Grandmother    Liver cancer Maternal Grandmother    Breast cancer  Cousin 18 -  59   Breast cancer Cousin 50 - 59   Breast cancer Cousin 50 - 59   Breast cancer Cousin 50 - 59    Review of Systems: Denies any recent fevers, chills or changes in her health  Physical Exam: Vital Signs BP 122/82   Pulse 83   Ht 5' 2 (1.575 m)   Wt 165 lb (74.8 kg)   LMP  (LMP Unknown) Comment: 07/2016  SpO2 99%   BMI 30.18 kg/m   Physical Exam  Constitutional:      General: Not in acute distress.    Appearance: Normal appearance. Not ill-appearing.  HENT:     Head: Normocephalic and atraumatic.  Eyes:     Pupils: Pupils are equal, round Neck:     Musculoskeletal: Normal range of motion.  Cardiovascular:     Rate and Rhythm: Normal rate Pulmonary:     Effort: Pulmonary effort is normal. No respiratory distress.  Musculoskeletal: Normal range of motion.  Skin:    General: Skin is warm and dry.     Findings: No erythema or rash.  Neurological:     Mental Status: Alert and oriented to person, place, and time. Mental status is at baseline.  Psychiatric:        Mood and Affect: Mood normal.        Behavior: Behavior normal.    Assessment/Plan: The patient is scheduled for panniculectomy with Dr. Lowery.  Risks, benefits, and alternatives of procedure discussed, questions answered and consent obtained.    Will send clearance to patient's PCP.  Patient is requesting to stay the night after her surgery.  I discussed with her that I will discuss this with Dr. Lowery to see if this can be arranged.  We did discuss the possibility of her not being able to stay the night.  She expressed understanding.  Smoking Status: Non-smoker; Counseling Given?  N/A  Caprini Score: 7; Risk Factors include: Age, BMI > 25, family history of thrombosis and length of planned surgery. Recommendation for mechanical and possible pharmacological prophylaxis. Encourage early ambulation.  Will discuss possibility of postoperative Lovenox  with Dr. Lowery.  Pictures  obtained: @consult   Post-op Rx sent to pharmacy: Oxycodone , Zofran , Keflex   Instructed patient to hold her losartan  and spironolactone  on the day of surgery.  Discussed with her to hold her meloxicam  the week before surgery.  Patient expressed understanding.  Patient was provided with the General Surgical Risk consent document and Pain Medication Agreement prior to their appointment.  They had adequate time to read through the risk consent documents and Pain Medication Agreement. We also discussed them in person together during this preop appointment. All of their questions were answered to their satisfaction.  Recommended calling if they have any further questions.  Risk consent form and Pain Medication Agreement to be scanned into patient's chart.  The consent was obtained with risks and complications reviewed which included bleeding, pain, scar, infection and the risk of anesthesia.  The patients questions were answered to the patients expressed satisfaction.    Electronically signed by: Estefana FORBES Peck, PA-C 05/14/2024 10:34 AM  At 5 weeks.    [1]  Allergies Allergen Reactions   Latex Itching    Powder in latex gloves   Reglan  [Metoclopramide ] Other (See Comments)    Muscle twitching/lip twitch   Vicodin [Hydrocodone-Acetaminophen ] Other (See Comments)    Made her feel high  Has had dilaudid  before and tolerated it well  [2]  Current Outpatient Medications:  acetaminophen  (TYLENOL ) 500 MG tablet, Take 2 tablets (1,000 mg total) by mouth every 6 (six) hours., Disp: 30 tablet, Rfl: 0   albuterol  (VENTOLIN  HFA) 108 (90 Base) MCG/ACT inhaler, Inhale 2 puffs into the lungs., Disp: , Rfl:    cephALEXin  (KEFLEX ) 500 MG capsule, Take 1 capsule (500 mg total) by mouth 4 (four) times daily for 3 days., Disp: 12 capsule, Rfl: 0   ipratropium-albuterol  (DUONEB) 0.5-2.5 (3) MG/3ML SOLN, Take 3 mLs by nebulization every 6 (six) hours as needed., Disp: 360 mL, Rfl: 0   loratadine  (CLARITIN )  10 MG tablet, Take 10 mg by mouth daily., Disp: , Rfl:    losartan  (COZAAR ) 25 MG tablet, Take 1 tablet (25 mg total) by mouth daily., Disp: 90 tablet, Rfl: 3   meloxicam  (MOBIC ) 15 MG tablet, Take 15 mg by mouth daily., Disp: , Rfl:    ondansetron  (ZOFRAN ) 4 MG tablet, Take 1 tablet (4 mg total) by mouth every 8 (eight) hours as needed for up to 15 doses for nausea or vomiting., Disp: 15 tablet, Rfl: 0   ondansetron  (ZOFRAN -ODT) 4 MG disintegrating tablet, Take 1 tablet (4 mg total) by mouth every 8 (eight) hours as needed for nausea or vomiting., Disp: 20 tablet, Rfl: 0   oxyCODONE  (ROXICODONE ) 5 MG immediate release tablet, Take 1 tablet (5 mg total) by mouth every 6 (six) hours as needed for up to 15 doses for severe pain (pain score 7-10)., Disp: 15 tablet, Rfl: 0   potassium chloride  SA (KLOR-CON  M) 20 MEQ tablet, TAKE 1 TABLET BY MOUTH EVERY DAY, Disp: 90 tablet, Rfl: 1   Probiotic Product (PROBIOTIC PO), Take 1 capsule by mouth daily., Disp: , Rfl:    spironolactone  (ALDACTONE ) 25 MG tablet, Take 1 tablet (25 mg total) by mouth daily., Disp: 90 tablet, Rfl: 3  "

## 2024-05-18 ENCOUNTER — Encounter (HOSPITAL_COMMUNITY): Payer: Self-pay

## 2024-05-18 NOTE — Progress Notes (Signed)
 Surgical Instructions   Your procedure is scheduled on Thursday May 27, 2024. Report to [facility] Main Entrance A at 5:30 A.M., then check in with the Admitting office. Any questions or running late day of surgery: call [phone]  Questions prior to your surgery date: call [phone], Monday-Friday, 8am-4pm. If you experience any cold or flu symptoms such as cough, fever, chills, shortness of breath, etc. between now and your scheduled surgery, please notify us  at the above number.     Remember:  Do not eat after midnight the night before your surgery   You may drink clear liquids until 4:30 the morning of your surgery.   Clear liquids allowed are: Water, Non-Citrus Juices (without pulp), Carbonated Beverages, Clear Tea (no milk, honey, etc.), Black Coffee Only (NO MILK, CREAM OR POWDERED CREAMER of any kind), and Gatorade.    Take these medicines the morning of surgery with A SIP OF WATER  loratadine  (CLARITIN )   May take these medicines IF NEEDED: acetaminophen  (TYLENOL )  albuterol  (VENTOLIN  HFA) 108 (90 Base) MCG/ACT inhaler   Please bring with you to the hospital ipratropium-albuterol  (DUONEB) 0.5-2.5 (3) MG/3ML SOLN  ondansetron  (ZOFRAN )  oxyCODONE  (ROXICODONE )   One week prior to surgery, STOP taking any Aspirin (unless otherwise instructed by your surgeon) Aleve , Naproxen , Ibuprofen , Motrin , Advil , Goody's, BC's, all herbal medications, fish oil, and non-prescription vitamins.  This includes your meloxicam  (MOBIC ).                       Do NOT Smoke (Tobacco/Vaping) for 24 hours prior to your procedure.  If you use a CPAP at night, you may bring your mask/headgear for your overnight stay.   You will be asked to remove any contacts, glasses, piercing's, hearing aid's, dentures/partials prior to surgery. Please bring cases for these items if needed.    Patients discharged the day of surgery will not be allowed to drive home, and someone needs to stay with them  for 24 hours.  SURGICAL WAITING ROOM VISITATION Patients may have no more than 2 support people in the waiting area - these visitors may rotate.   Pre-op nurse will coordinate an appropriate time for 1 ADULT support person, who may not rotate, to accompany patient in pre-op.  Children under the age of 42 must have an adult with them who is not the patient and must remain in the main waiting area with an adult.  If the patient needs to stay at the hospital during part of their recovery, the visitor guidelines for inpatient rooms apply.  Please refer to the [facility] website for the visitor guidelines for any additional information.   If you received a COVID test during your pre-op visit  it is requested that you wear a mask when out in public, stay away from anyone that may not be feeling well and notify your surgeon if you develop symptoms. If you have been in contact with anyone that has tested positive in the last 10 days please notify you surgeon.      Pre-operative CHG Bathing Instructions   You can play a key role in reducing the risk of infection after surgery. Your skin needs to be as free of germs as possible. You can reduce the number of germs on your skin by washing with CHG (chlorhexidine  gluconate) soap before surgery. CHG is an antiseptic soap that kills germs and continues to kill germs even after washing.   DO NOT use if you have an  allergy to chlorhexidine /CHG or antibacterial soaps. If your skin becomes reddened or irritated, stop using the CHG and notify one of our RNs at [phone].              TAKE A SHOWER THE NIGHT BEFORE SURGERY   Please keep in mind the following:  DO NOT shave, including legs and underarms, 48 hours prior to surgery.   Place clean sheets on your bed the night before surgery Use a clean washcloth (not used since being washed) for shower. DO NOT sleep with pet's night before surgery.  CHG Shower Instructions:  Wash your face and private  area with normal soap. If you choose to wash your hair, wash first with your normal shampoo.  After you use shampoo/soap, rinse your hair and body thoroughly to remove shampoo/soap residue.  Turn the water OFF and apply half the bottle of CHG soap to a CLEAN washcloth.  Apply CHG soap ONLY FROM YOUR NECK DOWN TO YOUR TOES (washing for 3-5 minutes)  DO NOT use CHG soap on face, private areas, open wounds, or sores.  Pay special attention to the area where your surgery is being performed.  If you are having back surgery, having someone wash your back for you may be helpful. Wait 2 minutes after CHG soap is applied, then you may rinse off the CHG soap.  Pat dry with a clean towel  Put on clean pajamas    Additional instructions for the day of surgery: If you choose, you may shower the morning of surgery with an antibacterial soap.  DO NOT APPLY any lotions, deodorants or perfumes.   Do not wear jewelry or makeup Do not wear nail polish, gel polish, artificial nails, or any other type of covering on natural nails (fingers and toes) Do not bring valuables to the hospital. [facility] is not responsible for valuables/personal belongings. Put on clean/comfortable clothes.  Please brush your teeth.  Ask your nurse before applying any prescription medications to the skin.

## 2024-05-19 ENCOUNTER — Encounter (HOSPITAL_COMMUNITY)
Admission: RE | Admit: 2024-05-19 | Discharge: 2024-05-19 | Disposition: A | Discharge status: Home / Self Care | Source: Ambulatory Visit | Attending: General Surgery | Admitting: General Surgery

## 2024-05-19 ENCOUNTER — Other Ambulatory Visit: Payer: Self-pay

## 2024-05-19 ENCOUNTER — Encounter (HOSPITAL_COMMUNITY): Payer: Self-pay

## 2024-05-19 VITALS — BP 110/90 | HR 90 | Temp 97.8°F | Resp 16 | Ht 62.0 in | Wt 164.0 lb

## 2024-05-19 DIAGNOSIS — Z01818 Encounter for other preprocedural examination: Secondary | ICD-10-CM | POA: Diagnosis present

## 2024-05-19 HISTORY — DX: Other complications of anesthesia, initial encounter: T88.59XA

## 2024-05-19 HISTORY — DX: Other cervical disc degeneration, unspecified cervical region: M50.30

## 2024-05-19 LAB — CBC
HCT: 37 % (ref 36.0–46.0)
Hemoglobin: 11.8 g/dL — ABNORMAL LOW (ref 12.0–15.0)
MCH: 27.6 pg (ref 26.0–34.0)
MCHC: 31.9 g/dL (ref 30.0–36.0)
MCV: 86.7 fL (ref 80.0–100.0)
Platelets: 359 K/uL (ref 150–400)
RBC: 4.27 MIL/uL (ref 3.87–5.11)
RDW: 13.9 % (ref 11.5–15.5)
WBC: 5.5 K/uL (ref 4.0–10.5)
nRBC: 0 % (ref 0.0–0.2)

## 2024-05-19 LAB — BASIC METABOLIC PANEL WITH GFR
Anion gap: 8 (ref 5–15)
BUN: 13 mg/dL (ref 6–20)
CO2: 30 mmol/L (ref 22–32)
Calcium: 9 mg/dL (ref 8.9–10.3)
Chloride: 102 mmol/L (ref 98–111)
Creatinine, Ser: 0.8 mg/dL (ref 0.44–1.00)
GFR, Estimated: >60 mL/min
Glucose, Bld: 104 mg/dL — ABNORMAL HIGH (ref 70–99)
Potassium: 3.2 mmol/L — ABNORMAL LOW (ref 3.5–5.1)
Sodium: 140 mmol/L (ref 135–145)

## 2024-05-19 NOTE — Progress Notes (Signed)
 PCP - Dr. Hadassah Ray Cardiologist -   PPM/ICD - denies Device Orders - na Rep Notified - na  Chest x-ray -  EKG - PAT, 05/19/2024 Stress Test -  ECHO -  Cardiac Cath -   Sleep Study - OSA, weight loss and no longer diagnosed CPAP - na  Non-diabetic  Blood Thinner Instructions:denies Aspirin Instructions:denies  ERAS Protcol -Clears until 0430  Anesthesia review: No  Patient denies shortness of breath, fever, cough and chest pain at PAT appointment   All instructions explained to the patient, with a verbal understanding of the material. Patient agrees to go over the instructions while at home for a better understanding. Patient also instructed to self quarantine after being tested for COVID-19. The opportunity to ask questions was provided.

## 2024-05-20 ENCOUNTER — Ambulatory Visit: Payer: Self-pay | Admitting: General Surgery

## 2024-05-26 NOTE — Anesthesia Preprocedure Evaluation (Addendum)
"                                    Anesthesia Evaluation  Patient identified by MRN, date of birth, ID band Patient awake    Reviewed: Allergy & Precautions, NPO status , Patient's Chart, lab work & pertinent test results  History of Anesthesia Complications Negative for: history of anesthetic complications  Airway Mallampati: II  TM Distance: >3 FB Neck ROM: Full    Dental  (+) Dental Advisory Given, Teeth Intact   Pulmonary asthma , sleep apnea    Pulmonary exam normal        Cardiovascular hypertension, Pt. on medications Normal cardiovascular exam     Neuro/Psych  Headaches  negative psych ROS   GI/Hepatic Neg liver ROS, hiatal hernia,GERD  Controlled,, S/p gastric banding    Endo/Other   K 3.2 Obesity   Renal/GU negative Renal ROS     Musculoskeletal negative musculoskeletal ROS (+)    Abdominal   Peds  Hematology  (+) Blood dyscrasia, anemia   Anesthesia Other Findings   Reproductive/Obstetrics  s/p hysterectomy                               Anesthesia Physical Anesthesia Plan  ASA: 3  Anesthesia Plan: General   Post-op Pain Management: Tylenol  PO (pre-op)* and Celebrex  PO (pre-op)*   Induction: Intravenous  PONV Risk Score and Plan: 3 and Treatment may vary due to age or medical condition, Ondansetron , Dexamethasone  and Midazolam   Airway Management Planned: Oral ETT  Additional Equipment: None  Intra-op Plan:   Post-operative Plan: Extubation in OR  Informed Consent: I have reviewed the patients History and Physical, chart, labs and discussed the procedure including the risks, benefits and alternatives for the proposed anesthesia with the patient or authorized representative who has indicated his/her understanding and acceptance.     Dental advisory given  Plan Discussed with: CRNA and Anesthesiologist  Anesthesia Plan Comments:          Anesthesia Quick Evaluation  "

## 2024-05-27 ENCOUNTER — Ambulatory Visit (HOSPITAL_COMMUNITY): Admitting: Anesthesiology

## 2024-05-27 ENCOUNTER — Other Ambulatory Visit: Payer: Self-pay

## 2024-05-27 ENCOUNTER — Inpatient Hospital Stay (HOSPITAL_COMMUNITY)
Admission: AD | Admit: 2024-05-27 | Discharge: 2024-06-01 | DRG: 571 | Disposition: A | Discharge status: Home / Self Care | Attending: Plastic Surgery | Admitting: Plastic Surgery

## 2024-05-27 ENCOUNTER — Encounter (HOSPITAL_COMMUNITY): Payer: Self-pay | Admitting: General Surgery

## 2024-05-27 ENCOUNTER — Encounter (HOSPITAL_COMMUNITY)
Admission: AD | Disposition: A | Discharge status: Home / Self Care | Payer: Self-pay | Source: Home / Self Care | Attending: Plastic Surgery

## 2024-05-27 DIAGNOSIS — K432 Incisional hernia without obstruction or gangrene: Secondary | ICD-10-CM | POA: Diagnosis present

## 2024-05-27 DIAGNOSIS — K9509 Other complications of gastric band procedure: Secondary | ICD-10-CM | POA: Diagnosis present

## 2024-05-27 DIAGNOSIS — J45909 Unspecified asthma, uncomplicated: Secondary | ICD-10-CM | POA: Diagnosis not present

## 2024-05-27 DIAGNOSIS — M793 Panniculitis, unspecified: Secondary | ICD-10-CM | POA: Diagnosis not present

## 2024-05-27 DIAGNOSIS — I10 Essential (primary) hypertension: Secondary | ICD-10-CM

## 2024-05-27 DIAGNOSIS — Z832 Family history of diseases of the blood and blood-forming organs and certain disorders involving the immune mechanism: Secondary | ICD-10-CM

## 2024-05-27 DIAGNOSIS — Z885 Allergy status to narcotic agent status: Secondary | ICD-10-CM

## 2024-05-27 DIAGNOSIS — Z91040 Latex allergy status: Secondary | ICD-10-CM

## 2024-05-27 DIAGNOSIS — Z803 Family history of malignant neoplasm of breast: Secondary | ICD-10-CM

## 2024-05-27 DIAGNOSIS — Z9884 Bariatric surgery status: Secondary | ICD-10-CM

## 2024-05-27 DIAGNOSIS — G4700 Insomnia, unspecified: Secondary | ICD-10-CM | POA: Diagnosis not present

## 2024-05-27 DIAGNOSIS — Z823 Family history of stroke: Secondary | ICD-10-CM

## 2024-05-27 DIAGNOSIS — Z79899 Other long term (current) drug therapy: Secondary | ICD-10-CM

## 2024-05-27 DIAGNOSIS — E876 Hypokalemia: Secondary | ICD-10-CM | POA: Diagnosis present

## 2024-05-27 DIAGNOSIS — K219 Gastro-esophageal reflux disease without esophagitis: Secondary | ICD-10-CM | POA: Diagnosis present

## 2024-05-27 DIAGNOSIS — Z800 Family history of malignant neoplasm of digestive organs: Secondary | ICD-10-CM

## 2024-05-27 DIAGNOSIS — Y848 Other medical procedures as the cause of abnormal reaction of the patient, or of later complication, without mention of misadventure at the time of the procedure: Secondary | ICD-10-CM | POA: Diagnosis present

## 2024-05-27 DIAGNOSIS — Z888 Allergy status to other drugs, medicaments and biological substances status: Secondary | ICD-10-CM

## 2024-05-27 HISTORY — PX: INCISIONAL HERNIA REPAIR: SHX193

## 2024-05-27 HISTORY — PX: ABDOMINOPLASTY/PANNICULECTOMY WITH LIPOSUCTION: SHX5577

## 2024-05-27 MED ORDER — METHOCARBAMOL 1000 MG/10ML IJ SOLN
500.0000 mg | Freq: Three times a day (TID) | INTRAMUSCULAR | Status: DC | PRN
  Administered 2024-05-28: 500 mg via INTRAVENOUS
  Filled 2024-05-27: qty 10

## 2024-05-27 MED ORDER — AMISULPRIDE (ANTIEMETIC) 5 MG/2ML IV SOLN
INTRAVENOUS | Status: AC
  Filled 2024-05-27: qty 4

## 2024-05-27 MED ORDER — SODIUM CHLORIDE 0.9 % IV SOLN: 12.5000 mg | INTRAVENOUS | Status: DC | PRN

## 2024-05-27 MED ORDER — KCL IN DEXTROSE-NACL 10-5-0.45 MEQ/L-%-% IV SOLN
INTRAVENOUS | Status: AC
  Filled 2024-05-27 (×3): qty 1000

## 2024-05-27 MED ORDER — BUPIVACAINE LIPOSOME 1.3 % IJ SUSP
INTRAMUSCULAR | Status: AC
  Filled 2024-05-27: qty 20

## 2024-05-27 MED ORDER — CHLORHEXIDINE GLUCONATE 0.12 % MT SOLN
15.0000 mL | Freq: Once | OROMUCOSAL | Status: AC
  Administered 2024-05-27: 15 mL via OROMUCOSAL
  Filled 2024-05-27: qty 15

## 2024-05-27 MED ORDER — SENNA 8.6 MG PO TABS
1.0000 | ORAL_TABLET | Freq: Two times a day (BID) | ORAL | Status: DC
  Administered 2024-05-28 – 2024-05-31 (×8): 8.6 mg via ORAL
  Filled 2024-05-27 (×8): qty 1

## 2024-05-27 MED ORDER — ONDANSETRON HCL 4 MG/2ML IJ SOLN
INTRAMUSCULAR | Status: AC
  Filled 2024-05-27: qty 2

## 2024-05-27 MED ORDER — FENTANYL CITRATE (PF) 250 MCG/5ML IJ SOLN
INTRAMUSCULAR | Status: DC | PRN
  Administered 2024-05-27: 50 ug via INTRAVENOUS
  Administered 2024-05-27 (×2): 100 ug via INTRAVENOUS

## 2024-05-27 MED ORDER — CEFAZOLIN SODIUM-DEXTROSE 2-4 GM/100ML-% IV SOLN
INTRAVENOUS | Status: AC
  Filled 2024-05-27: qty 100

## 2024-05-27 MED ORDER — BUPIVACAINE LIPOSOME 1.3 % IJ SUSP
INTRAMUSCULAR | Status: DC | PRN
  Administered 2024-05-27: 20 mL

## 2024-05-27 MED ORDER — DIPHENHYDRAMINE HCL 50 MG/ML IJ SOLN: 12.5000 mg | Freq: Four times a day (QID) | INTRAMUSCULAR | Status: DC | PRN

## 2024-05-27 MED ORDER — PHENYLEPHRINE 80 MCG/ML (10ML) SYRINGE FOR IV PUSH (FOR BLOOD PRESSURE SUPPORT)
PREFILLED_SYRINGE | INTRAVENOUS | Status: AC
  Filled 2024-05-27: qty 10

## 2024-05-27 MED ORDER — ORAL CARE MOUTH RINSE: 15.0000 mL | Freq: Once | OROMUCOSAL | Status: AC

## 2024-05-27 MED ORDER — OXYCODONE HCL 5 MG PO TABS: 5.0000 mg | ORAL_TABLET | Freq: Once | ORAL | Status: DC | PRN

## 2024-05-27 MED ORDER — 0.9 % SODIUM CHLORIDE (POUR BTL) OPTIME
TOPICAL | Status: DC | PRN
  Administered 2024-05-27: 1000 mL

## 2024-05-27 MED ORDER — FENTANYL CITRATE (PF) 250 MCG/5ML IJ SOLN
INTRAMUSCULAR | Status: AC
  Filled 2024-05-27: qty 5

## 2024-05-27 MED ORDER — SUGAMMADEX SODIUM 200 MG/2ML IV SOLN
INTRAVENOUS | Status: DC | PRN
  Administered 2024-05-27: 200 mg via INTRAVENOUS

## 2024-05-27 MED ORDER — LACTATED RINGERS IV SOLN
Freq: Once | INTRAVENOUS | Status: DC
  Filled 2024-05-27 (×3): qty 30

## 2024-05-27 MED ORDER — ZOLPIDEM TARTRATE 5 MG PO TABS
5.0000 mg | ORAL_TABLET | Freq: Every evening | ORAL | Status: DC | PRN
  Administered 2024-05-30 – 2024-05-31 (×2): 5 mg via ORAL
  Filled 2024-05-27 (×2): qty 1

## 2024-05-27 MED ORDER — ROCURONIUM BROMIDE 10 MG/ML (PF) SYRINGE
PREFILLED_SYRINGE | INTRAVENOUS | Status: AC
  Filled 2024-05-27: qty 10

## 2024-05-27 MED ORDER — LIDOCAINE-EPINEPHRINE (PF) 1 %-1:200000 IJ SOLN
INTRAMUSCULAR | Status: AC
  Filled 2024-05-27: qty 30

## 2024-05-27 MED ORDER — MIDAZOLAM HCL 2 MG/2ML IJ SOLN
INTRAMUSCULAR | Status: AC
  Filled 2024-05-27: qty 2

## 2024-05-27 MED ORDER — CEFAZOLIN SODIUM-DEXTROSE 2-3 GM-%(50ML) IV SOLR
INTRAVENOUS | Status: DC | PRN
  Administered 2024-05-27: 2 g via INTRAVENOUS

## 2024-05-27 MED ORDER — IBUPROFEN 400 MG PO TABS
400.0000 mg | ORAL_TABLET | Freq: Four times a day (QID) | ORAL | Status: DC
  Administered 2024-05-27 – 2024-06-01 (×19): 400 mg via ORAL
  Filled 2024-05-27 (×20): qty 1

## 2024-05-27 MED ORDER — KETAMINE HCL 50 MG/5ML IJ SOSY
PREFILLED_SYRINGE | INTRAMUSCULAR | Status: AC
  Filled 2024-05-27: qty 5

## 2024-05-27 MED ORDER — DEXAMETHASONE SOD PHOSPHATE PF 10 MG/ML IJ SOLN
INTRAMUSCULAR | Status: AC
  Filled 2024-05-27: qty 1

## 2024-05-27 MED ORDER — MIDAZOLAM HCL (PF) 2 MG/2ML IJ SOLN
INTRAMUSCULAR | Status: DC | PRN
  Administered 2024-05-27: 2 mg via INTRAVENOUS

## 2024-05-27 MED ORDER — OXYCODONE HCL 5 MG PO TABS
5.0000 mg | ORAL_TABLET | ORAL | Status: DC | PRN
  Administered 2024-05-27 – 2024-05-30 (×9): 10 mg via ORAL
  Filled 2024-05-27 (×10): qty 2

## 2024-05-27 MED ORDER — LIDOCAINE-EPINEPHRINE (PF) 1 %-1:200000 IJ SOLN
INTRAMUSCULAR | Status: DC | PRN
  Administered 2024-05-27: 35 mL via SURGICAL_CAVITY

## 2024-05-27 MED ORDER — MORPHINE SULFATE (PF) 2 MG/ML IV SOLN
2.0000 mg | INTRAVENOUS | Status: DC | PRN
  Administered 2024-05-27 – 2024-05-30 (×6): 2 mg via INTRAVENOUS
  Filled 2024-05-27 (×6): qty 1

## 2024-05-27 MED ORDER — PROPOFOL 10 MG/ML IV BOLUS
INTRAVENOUS | Status: DC | PRN
  Administered 2024-05-27: 150 mg via INTRAVENOUS

## 2024-05-27 MED ORDER — BISACODYL 10 MG RE SUPP
10.0000 mg | Freq: Every day | RECTAL | Status: DC | PRN
  Filled 2024-05-27: qty 1

## 2024-05-27 MED ORDER — DEXAMETHASONE SOD PHOSPHATE PF 10 MG/ML IJ SOLN
INTRAMUSCULAR | Status: DC | PRN
  Administered 2024-05-27: 10 mg via INTRAVENOUS

## 2024-05-27 MED ORDER — FENTANYL CITRATE (PF) 100 MCG/2ML IJ SOLN
INTRAMUSCULAR | Status: AC
  Filled 2024-05-27: qty 2

## 2024-05-27 MED ORDER — CEFAZOLIN SODIUM-DEXTROSE 2-4 GM/100ML-% IV SOLN
2.0000 g | Freq: Three times a day (TID) | INTRAVENOUS | Status: DC
  Administered 2024-05-27 – 2024-06-01 (×15): 2 g via INTRAVENOUS
  Filled 2024-05-27 (×15): qty 100

## 2024-05-27 MED ORDER — LIDOCAINE-EPINEPHRINE 1 %-1:100000 IJ SOLN
INTRAMUSCULAR | Status: AC
  Filled 2024-05-27: qty 1

## 2024-05-27 MED ORDER — PHENYLEPHRINE 80 MCG/ML (10ML) SYRINGE FOR IV PUSH (FOR BLOOD PRESSURE SUPPORT)
PREFILLED_SYRINGE | INTRAVENOUS | Status: DC | PRN
  Administered 2024-05-27 (×3): 80 ug via INTRAVENOUS

## 2024-05-27 MED ORDER — ONDANSETRON 4 MG PO TBDP
4.0000 mg | ORAL_TABLET | Freq: Four times a day (QID) | ORAL | Status: DC | PRN
  Administered 2024-05-29: 4 mg via ORAL
  Filled 2024-05-27 (×2): qty 1

## 2024-05-27 MED ORDER — ACETAMINOPHEN 10 MG/ML IV SOLN
INTRAVENOUS | Status: DC | PRN
  Administered 2024-05-27: 1000 mg via INTRAVENOUS

## 2024-05-27 MED ORDER — ONDANSETRON HCL 4 MG/2ML IJ SOLN
INTRAMUSCULAR | Status: DC | PRN
  Administered 2024-05-27: 4 mg via INTRAVENOUS

## 2024-05-27 MED ORDER — ACETAMINOPHEN 500 MG PO TABS
1000.0000 mg | ORAL_TABLET | Freq: Once | ORAL | Status: DC
  Filled 2024-05-27: qty 2

## 2024-05-27 MED ORDER — CELECOXIB 200 MG PO CAPS
ORAL_CAPSULE | ORAL | Status: AC
  Filled 2024-05-27: qty 2

## 2024-05-27 MED ORDER — ALBUMIN HUMAN 5 % IV SOLN: INTRAVENOUS | Status: DC | PRN

## 2024-05-27 MED ORDER — POLYETHYLENE GLYCOL 3350 17 G PO PACK
17.0000 g | PACK | Freq: Every day | ORAL | Status: DC | PRN
  Administered 2024-05-31: 17 g via ORAL
  Filled 2024-05-27: qty 1

## 2024-05-27 MED ORDER — DIPHENHYDRAMINE HCL 12.5 MG/5ML PO ELIX: 12.5000 mg | ORAL_SOLUTION | Freq: Four times a day (QID) | ORAL | Status: DC | PRN

## 2024-05-27 MED ORDER — METHOCARBAMOL 500 MG PO TABS
500.0000 mg | ORAL_TABLET | Freq: Three times a day (TID) | ORAL | Status: DC | PRN
  Administered 2024-05-28 – 2024-05-31 (×7): 500 mg via ORAL
  Filled 2024-05-27 (×7): qty 1

## 2024-05-27 MED ORDER — ONDANSETRON HCL 4 MG/2ML IJ SOLN
4.0000 mg | Freq: Four times a day (QID) | INTRAMUSCULAR | Status: DC | PRN
  Administered 2024-05-27 – 2024-05-31 (×7): 4 mg via INTRAVENOUS
  Filled 2024-05-27 (×8): qty 2

## 2024-05-27 MED ORDER — OXYCODONE HCL 5 MG/5ML PO SOLN: 5.0000 mg | Freq: Once | ORAL | Status: DC | PRN

## 2024-05-27 MED ORDER — BUPIVACAINE HCL (PF) 0.25 % IJ SOLN
INTRAMUSCULAR | Status: AC
  Filled 2024-05-27: qty 30

## 2024-05-27 MED ORDER — LACTATED RINGERS IV SOLN: INTRAVENOUS | Status: DC

## 2024-05-27 MED ORDER — FENTANYL CITRATE (PF) 100 MCG/2ML IJ SOLN
25.0000 ug | INTRAMUSCULAR | Status: DC | PRN
  Administered 2024-05-27 (×3): 50 ug via INTRAVENOUS

## 2024-05-27 MED ORDER — PROPOFOL 10 MG/ML IV BOLUS
INTRAVENOUS | Status: AC
  Filled 2024-05-27: qty 20

## 2024-05-27 MED ORDER — LIDOCAINE 2% (20 MG/ML) 5 ML SYRINGE
INTRAMUSCULAR | Status: DC | PRN
  Administered 2024-05-27: 60 mg via INTRAVENOUS

## 2024-05-27 MED ORDER — ACETAMINOPHEN 10 MG/ML IV SOLN
INTRAVENOUS | Status: AC
  Filled 2024-05-27: qty 100

## 2024-05-27 MED ORDER — ROCURONIUM BROMIDE 10 MG/ML (PF) SYRINGE
PREFILLED_SYRINGE | INTRAVENOUS | Status: DC | PRN
  Administered 2024-05-27: 20 mg via INTRAVENOUS
  Administered 2024-05-27: 60 mg via INTRAVENOUS

## 2024-05-27 MED ORDER — LIDOCAINE 2% (20 MG/ML) 5 ML SYRINGE
INTRAMUSCULAR | Status: AC
  Filled 2024-05-27: qty 5

## 2024-05-27 MED ORDER — AMISULPRIDE (ANTIEMETIC) 5 MG/2ML IV SOLN
10.0000 mg | Freq: Once | INTRAVENOUS | Status: AC | PRN
  Administered 2024-05-27: 10 mg via INTRAVENOUS

## 2024-05-27 NOTE — Interval H&P Note (Signed)
 History and Physical Interval Note:  05/27/2024 6:54 AM  Meagan Dean  has presented today for surgery, with the diagnosis of INCISIONAL HERNIA.  The various methods of treatment have been discussed with the patient and family. After consideration of risks, benefits and other options for treatment, the patient has consented to  Procedures: REPAIR, HERNIA, INCISIONAL (N/A) PANNICULECTOMY (N/A) as a surgical intervention.  The patient's history has been reviewed, patient examined, no change in status, stable for surgery.  I have reviewed the patient's chart and labs.  Questions were answered to the patient's satisfaction.     Estefana RAMAN Raymonde Hamblin

## 2024-05-27 NOTE — Anesthesia Procedure Notes (Signed)
 Procedure Name: Intubation Date/Time: 05/27/2024 7:55 AM  Performed by: Hedy Jarred, CRNAPre-anesthesia Checklist: Patient identified, Emergency Drugs available, Suction available and Patient being monitored Patient Re-evaluated:Patient Re-evaluated prior to induction Oxygen Delivery Method: Circle System Utilized Preoxygenation: Pre-oxygenation with 100% oxygen Induction Type: IV induction Ventilation: Mask ventilation without difficulty Laryngoscope Size: Miller and 2 Grade View: Grade I Tube type: Oral Tube size: 7.0 mm Number of attempts: 1 Airway Equipment and Method: Stylet and Oral airway Placement Confirmation: ETT inserted through vocal cords under direct vision, positive ETCO2 and breath sounds checked- equal and bilateral Secured at: 21 cm Tube secured with: Tape Dental Injury: Teeth and Oropharynx as per pre-operative assessment

## 2024-05-27 NOTE — H&P (Signed)
 Chief Complaint  Patient presents with  New Problem  Ventral Hernia, CT done, lap band 2010 BH, h/o lap appy [name] 2016, ex lap LOA BT 2017   Subjective   Meagan Dean is a 54 y.o. female established patient in today for: History of Present Illness Meagan Dean is a 54 year old female who presents with a hernia and concerns about weight management.  She experiences occasional pulling sensations in her abdomen, which may be related to the hernia. She is awaiting insurance approval for a potential surgical procedure involving her stomach. She has a history of bowel obstructions, with the most recent occurring last year, and experiences persistent nausea. Frequent heartburn occurs, especially with certain foods or medications, and she occasionally experiences regurgitation if she overeats.  She has a gastric band in place and desires another fill as she feels she can eat more than she should. Her weight has fluctuated, currently at 165 pounds, up from a previous low of 155 pounds. A previous scan showed fluid in the esophagus above the band, which might indicate issues with esophageal emptying or band tightness. She primarily consumes fish, which she tolerates well, but is cautious with other foods to avoid regurgitation.  She wears a body suit to manage excess skin and fat and is considering plastic surgery for skin reduction, trying to coordinate this with her hernia repair. 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  Patient Active Problem List  Diagnosis  Abdominal pain in female  Asthma due to seasonal allergies (HHS-HCC)  Cramping of feet  Diarrhea  Fatigue  History of endometriosis  Hypertension  Hypokalemia  Numbness and tingling  Polyuria  Routine health maintenance  SBO (small bowel obstruction) (CMS/HHS-HCC)  Umbilical hernia   Outpatient Medications Prior to Visit  Medication Sig Dispense Refill  KLOR-CON  M20 20  mEq ER tablet Take 20 mEq by mouth once daily  losartan  (COZAAR ) 25 MG tablet Take 25 mg by mouth once daily  spironolactone  (ALDACTONE ) 25 MG tablet Take 25 mg by mouth once daily   No facility-administered medications prior to visit.    Objective   Vitals:  02/18/24 1101  BP: 130/88  Pulse: 82  Temp: 36.8 C (98.2 F)  TempSrc: Temporal  SpO2: 99%  Weight: 72.8 kg (160 lb 6.4 oz)  Height: 157.5 cm (5' 2)  PainSc: 0-No pain   Body mass index is 29.34 kg/m. Physical Exam Constitutional:  Appearance: Normal appearance.  HENT:  Head: Normocephalic and atraumatic.  Pulmonary:  Effort: Pulmonary effort is normal.  Abdominal:  Comments: Laxity over left periumbilical area with fascial defect  Musculoskeletal:  General: Normal range of motion.  Cervical back: Normal range of motion.  Neurological:  General: No focal deficit present.  Mental Status: She is alert and oriented to person, place, and time. Mental status is at baseline.  Psychiatric:  Mood and Affect: Mood normal.  Behavior: Behavior normal.  Thought Content: Thought content normal.    I reviewed CT scan images showing fluid in the esophagus above the band, one-inch defect in the muscular layer leading to a hernia with a loop of intestine protruding into the sac. In addition, the band is more rotated than expected.  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 CT shows a one-inch defect with intestinal protrusion. High risk of complications due  to bowel involvement. - Plan hernia repair within the next year, potentially with a plastic surgeon for simultaneous procedures. - Review chart to identify plastic surgeon and coordinate surgery. - If insurance covers plastic surgery, perform both surgeries simultaneously. - If insurance does not cover plastic  surgery, proceed with hernia repair independently. - Use mesh repair technique to reduce hernia and reinforce abdominal wall, minimizing recurrence and complications. - Discussed potential for post-surgical soreness, significant pain expected for at least a week, some pain for about a month. Diagnoses and all orders for this visit:  Incisional hernia without obstruction or gangrene  LAP-BAND surgery status  Pannus, abdominal

## 2024-05-27 NOTE — Transfer of Care (Signed)
 Immediate Anesthesia Transfer of Care Note  Patient: Meagan Dean  Procedure(s) Performed: REPAIR, HERNIA, INCISIONAL PANNICULECTOMY, ABDOMINAL, WITH LIPOSUCTION  Patient Location: PACU  Anesthesia Type:General  Level of Consciousness: drowsy  Airway & Oxygen Therapy: Patient Spontanous Breathing and Patient connected to nasal cannula oxygen  Post-op Assessment: Report given to RN and Post -op Vital signs reviewed and stable  Post vital signs: Reviewed and stable  Last Vitals:  Vitals Value Taken Time  BP 137/85 05/27/24 10:30  Temp    Pulse 100 05/27/24 10:35  Resp 17 05/27/24 10:35  SpO2 98 % 05/27/24 10:35  Vitals shown include unfiled device data.  Last Pain:  Vitals:   05/27/24 0610  TempSrc:   PainSc: 2          Complications: No notable events documented.

## 2024-05-27 NOTE — Discharge Instructions (Addendum)
 [facility] _______[facility]  UMBILICAL OR INGUINAL HERNIA REPAIR: POST OP INSTRUCTIONS  Always review your discharge instruction sheet given to you by the facility where your surgery was performed. IF YOU HAVE DISABILITY OR FAMILY LEAVE FORMS, YOU MUST BRING THEM TO THE OFFICE FOR PROCESSING.   DO NOT GIVE THEM TO YOUR DOCTOR.  1. A  prescription for pain medication may be given to you upon discharge.  Take your pain medication as prescribed, if needed.  If narcotic pain medicine is not needed, then you may take acetaminophen  (Tylenol ) or ibuprofen  (Advil ) as needed. 2. Take your usually prescribed medications unless otherwise directed. If you need a refill on your pain medication, please contact your pharmacy.  They will contact our office to request authorization. Prescriptions will not be filled after 5 pm or on week-ends. 3. You should follow a light diet the first 24 hours after arrival home, such as soup and crackers, etc.  Be sure to include lots of fluids daily.  Resume your normal diet the day after surgery. 4.Most patients will experience some swelling and bruising around the umbilicus or in the groin and scrotum.  Ice packs and reclining will help.  Swelling and bruising can take several days to resolve.  6. It is common to experience some constipation if taking pain medication after surgery.  Increasing fluid intake and taking a stool softener (such as Colace) will usually help or prevent this problem from occurring.  A mild laxative (Milk of Magnesia or Miralax ) should be taken according to package directions if there are no bowel movements after 48 hours. 7. Unless discharge instructions indicate otherwise, you may remove your bandages 24-48 hours after surgery, and you may shower at that time.  You may have steri-strips (small skin tapes) in place directly over the incision.  These strips should be left on the skin for 7-10 days.  If your surgeon used skin glue on the  incision, you may shower in 24 hours.  The glue will flake off over the next 2-3 weeks.  Any sutures or staples will be removed at the office during your follow-up visit. 8. ACTIVITIES:  You may resume regular (light) daily activities beginning the next day--such as daily self-care, walking, climbing stairs--gradually increasing activities as tolerated.  You may have sexual intercourse when it is comfortable.  Refrain from any heavy lifting or straining until approved by your doctor.  a.You may drive when you are no longer taking prescription pain medication, you can comfortably wear a seatbelt, and you can safely maneuver your car and apply brakes. b.RETURN TO WORK:   _____________________________________________  9.You should see your doctor in the office for a follow-up appointment approximately 2-3 weeks after your surgery.  Make sure that you call for this appointment within a day or two after you arrive home to insure a convenient appointment time. 10.OTHER INSTRUCTIONS: _________________________    _____________________________________  WHEN TO CALL YOUR DOCTOR: Fever over 101.0 Inability to urinate Nausea and/or vomiting Extreme swelling or bruising Continued bleeding from incision. Increased pain, redness, or drainage from the incision  The clinic staff is available to answer your questions during regular business hours.  Please dont hesitate to call and ask to speak to one of the nurses for clinical concerns.  If you have a medical emergency, go to the nearest emergency room or call 911.  A surgeon from [facility] is always on call at the hospital   [address], [address],  [place], [place]  [address] ?  [address] [address], [place], [place]   [address] [phone] ? [phone] ? FAX [phone] Web site: [email or web address]    Social Connections Resources   Active Adults  Program https://www.[place].gov/departments/parks-recreation/active-adults-50  -Adding Health to Our Years Courses  -Aquatics  -Exercise Classes: Yoga, Dance, [place], etc.  -Book Club, Games  Locations vary by activity - Main Contact # [phone]) - see calendars on website listed above.  Senior Centers -[facility] [address] [place], [place] [address] / [phone] ext [phone] -[facility] [address], [place] [address] / [phone] [facility]) [address], [place], [place] [address] / [phone] [facility] locations in [place], [place], and [place] / [phone] -[facility] [address], [place] [address] / [phone]  Program of [facility] the Elderly (PACE) [address] [place], [place] [address] - Office #: [phone] - Enrollment Phone #: [phone]  [facility] Line  Call toll free, available 24 hours a day, [phone]   [place] [address]  [place] 2-1-1 is another useful way to locate resources in the community. Visit shedsizes.ch to find service information online. If you need additional assistance, 2-1-1 Referral Specialists are available 24 hours a day, every day by dialing 2-1-1 or [phone] from any phone. The call is free, confidential, and available in any language.     INSTRUCTIONS FOR AFTER ABDOMINAL SURGERY  You will likely have some questions about what to expect following your operation.  The following information will help you and your family understand what to expect when you get home.  Following these guidelines will help ensure a smooth recovery and reduce risks of complications.  Postoperative instructions include information on: diet, wound care, medications and physical activity.  AFTER SURGERY Expect to go home after the procedure.  In some cases, you may need to  spend one night in the hospital for observation.  DIET This surgery does not require a specific diet.  However, the healthier you eat the better your body can heal. It is important to increasing your protein intake.  Limit foods with high sugar and  carbohydrate content.  Focus on vegetables, meat and other protein sources if you are vegan or vegetarian.  If you undergo liposuction during your procedure it is very important to drink 8 oz of water every hour while awake for 2 days.  If your urine is bright yellow, then it is concentrated, and you need to drink more water.  If you find you are persistently nauseated or unable to take in liquids let us  know.  NO TOBACCO USE or EXPOSURE.  This will slow your healing process and increase the risk of a wound.  WOUND CARE Leave the prevena wound vac in place at all times. Do not get this wet.  You may have Topifoam or Lipofoam on.  It is soft and spongy and helps keep you from getting creases if you have liposuction.  This can be removed before the shower and then replaced.  If you need more it is available on [employer] as lipofoam.  If you have steri-strips / tape directly attached to your skin leave them in place. It is OK to get these wet.  No baths, pools or hot tubs for four weeks. We close your incision to leave the smallest and best-looking scar. No ointment or creams on your incisions until cleared by your surgeon.  No Neosporin (Too many skin reactions with this one).  After the steri-strips are off can use Mederma or Skinuva and start massaging the scar. Continue to wear the binder/spanx or Ace wrap around the clock, including while sleeping, for 6 weeks. This provides added comfort and helps reduce the fluid accumulation at the surgery site.  ACTIVITY No heavy lifting until cleared by the doctor.  For example, no more than a half-gallon of milk.  It is OK to walk and you are encouraged to move your legs to help decrease your risk of getting a blood  clot.  It will also help keep you from getting deconditioned.  Every 1 to 2 hours get up and walk for 5 minutes. This will help with a quicker recovery back to normal.  Let pain be your guide so you don't do too much.     SLEEPING / RESTING Sleeping and resting should be in the jack-knife or bent forward position with your head elevated.  This will help reduce pulling on your abdominal incision.  You can elevate your head and upper back with a few pillows and place a pillow under your knees.  Avoid stomach sleeping for 3 months.   WORK Everyone returns to work at different times. As a rough guide, most people take 1 - 2 weeks off prior to returning to work. If you need documentation for your job give them to the front staff for processing.  DRIVING Arrange for someone to bring you home from the hospital.  You may be able to drive a few days after surgery but not while taking any narcotics or valium.  This is for your safety as well as others sharing the road with you.  BOWEL MOVEMENTS Constipation can occur after anesthesia and while taking pain medication.  It is important to stay ahead for your comfort.  We recommend taking Milk of Magnesia (2 tablespoons; twice a day) while taking the pain pills.  MEDICATIONS (you may receive and should be started after surgery) At your preoperative visit for you history and physical you were given the following medications: Antibiotic: Start this medication when you get home and take according to the instructions on the bottle. Zofran  4 mg:  This is to treat nausea and vomiting.  You can take this every 6 hours as needed and only if needed. Oxycodone  5 mg every 6 hours for 3-5 days.  This is to be used after you have taken the Motrin  / Tylenol .  Over the counter Medication to take: Ibuprofen  (Motrin ) 400 - 600 mg every 6 hour for 7 days Tylenol  500 mg every 6 hours for 7 days.  Only take the Oxycodone  after you have tried these two. MiraLAX  or stool  softener of choice: Take this according to the bottle if you take the Norco.  If muscle work done:  Flexeril  5 mg every 12 hours for 7 days.  WHEN TO CALL Call your surgeon's office if any of the following occur:  Fever 101 degrees F or greater  Excessive bleeding or fluid from the incision site.  Pain that increases over time without aid from the medications  Redness, warmth, or pus draining from incision sites  Persistent nausea or inability to take in liquids  Severe misshapen area that underwent the operation.

## 2024-05-27 NOTE — Anesthesia Postprocedure Evaluation (Signed)
"   Anesthesia Post Note  Patient: Meagan Dean  Procedure(s) Performed: REPAIR, HERNIA, INCISIONAL PANNICULECTOMY, ABDOMINAL, WITH LIPOSUCTION     Patient location during evaluation: PACU Anesthesia Type: General Level of consciousness: awake and alert Pain management: pain level controlled Vital Signs Assessment: post-procedure vital signs reviewed and stable Respiratory status: spontaneous breathing, nonlabored ventilation and respiratory function stable Cardiovascular status: stable and blood pressure returned to baseline Anesthetic complications: no   No notable events documented.  Last Vitals:  Vitals:   05/27/24 1230 05/27/24 1300  BP: 128/87 119/76  Pulse: 84 82  Resp: 14 14  Temp:    SpO2: 94% 97%    Last Pain:  Vitals:   05/27/24 1300  TempSrc:   PainSc: 0-No pain                 Debby FORBES Like      "

## 2024-05-27 NOTE — Op Note (Signed)
 PATIENT:  Meagan Dean  54 y.o. female  PRE-OPERATIVE DIAGNOSIS:  INCISIONAL HERNIA  POST-OPERATIVE DIAGNOSIS:  INCISIONAL HERNIA  PROCEDURE:  Procedures: REPAIR, HERNIA, INCISIONAL PANNICULECTOMY  SURGEON:  Surgeon(s): Mishawn Didion, Herlene Righter, MD Dillingham, Estefana RAMAN, DO  ASSISTANT: none   ANESTHESIA:   local and general  Indications for procedure: Meagan Dean is a 54 y.o. year old female with symptoms of incisional hernia and panniculectomy after lap band.  Description of procedure: The patient was brought into the operative suite. Anesthesia was administered with General endotracheal anesthesia. WHO checklist was applied. The patient was then placed in supine. The area was prepped and draped in the usual sterile fashion.  Dr. Lowery made a low transverse incision and dissected the subcutaneous tissue off the anterior fascia working up to and above umbilicus. Please see her dictation for more detail   The hernia sac was dissected free from surrounding tissues in 360 degrees. The umbilical skin was dissected free of the hernia sac with cautery. The hernia sac was reduced and contained no visceral structures. The hernia defect was 3.5 cm in diameter. The hernia sac was removed. Due to the size of the hernia, a 8 cm ventralex mesh was placed as an underlay using 8 0 novafils to anchor the mesh. The fascial defect was then primarily closed with interrupted 0 PDS sutures.  Dr. Lowery then completed the panniculectomy.  Findings: 3.5 cm periumbilical hernia  Specimen: none  Blood loss: 10 ml  Local anesthesia: per Dillingham  Complications: none  PLAN OF CARE: Admit for overnight observation  PATIENT DISPOSITION:  PACU - hemodynamically stable.  Herlene Bureau, M.D. General, Bariatric, & Minimally Invasive Surgery [facility] [place]  05/27/2024 8:57 AM

## 2024-05-27 NOTE — Op Note (Signed)
 Operative Report  Date of operation: 05/27/2024  Patient: Meagan Dean, MRN: [id number], 54 y.o. female.   Date of birth: 04/29/1971  Location: [facility]  Preoperative Diagnosis: Panniculitis  Postoperative Diagnosis:  Same  Procedure: Panniculectomy  Surgeon:  Dr. Estefana Aitanna Haubner  Anesthesia:  General  EBL:  50 cc  Drains:  2 number 19 blake round drains  Condition:  Stable  Complications: None  Disposition: Recovery Room  Procedure in Detail: Patient was seen the morning of her surgery and marked out for the procedure. She was then given an IV and IV antibiotics. The patient was taken to the operating room and underwent general anesthesia.  A time out was called and all information was confirmed to be correct. SCD's and a pillow under the knees was in place. The patient was then prepped and draped in the standard sterile fashion. Local was placed into the incision. The flanks were liposuctioned and 100 cc removed from the sides. The planned lower incision was then incised and the incision taken down through the Scarpa's fascia to the rectus abdominus fascia. The skin and subcutaneous tissue was then lifted off the fascia up to the level of the umbilicus. The umbilicus is then circumscribed and freed up from the surrounding skin and freed down to the abdominal wall. The abdominal wall was then freed above the umbilicus but not widely to above the hernia. General surgery then repaired the hernia for which I assisted.    The patient was then flexed on the table and the amount that could be excised was confirmed. The pannus was excised and it weighed 2100 grams. The wound was irrigated with normal saline solution. Two #19 blake round drains were placed and secured with 3-0 Silk. Experel and myriad were placed in the pocket 2 gm. Liposuction was done laterally for improvement in contour. The new position of the umbilicus was determined and a small v shaped incision  placed in the abdominal wall. The umbilicus was inset from the dermis to the abdominal wall and then run closed with 4-0 monocryl in a subcuticular fashion. The abdominal wall was closed with buried 3-0 Monocryl and subcuticular 4-0 Monocryl.   A VAC, ABD's and an abdominal binder were placed. Patient was allowed to wake up, extubated and taken on a stretcher in the flexed position to the recovery room. Family was notified at the end of the case.

## 2024-05-28 ENCOUNTER — Encounter (HOSPITAL_COMMUNITY): Payer: Self-pay | Admitting: General Surgery
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
## 2024-05-28 NOTE — Progress Notes (Addendum)
 1 Day Post-Op  Subjective: Patient is a 54 year old female who underwent panniculectomy with Dr. Lowery yesterday, 05/27/2024.  Of note, she also underwent repair of incisional hernia at the same time with Dr. Stevie.   Upon entering the room today, patient is sitting comfortably in bed in no acute distress.  Her neighbor is at bedside.  Patient reports she is doing okay.  She reports she is having pain since surgery yesterday, reports pain to the upper abdomen more so.  She reports that yesterday she vomited after eating.  She reports that she needed assistance getting up and going to the bathroom.  She reports that she has not had a bowel movement.  She does not report any dizziness or lightheadedness.  Objective: Vital signs in last 24 hours: Temp:  [97.6 F (36.4 C)-98.2 F (36.8 C)] 98.2 F (36.8 C) (01/16 0922) Pulse Rate:  [74-80] 74 (01/16 0922) Resp:  [16-18] 16 (01/16 0922) BP: (87-117)/(59-80) 93/69 (01/16 0922) SpO2:  [98 %-100 %] 100 % (01/16 0922) Last BM Date : 05/27/23  Intake/Output from previous day: 01/15 0701 - 01/16 0700 In: 2660.4 [P.O.:120; I.V.:2090.4; IV Piggyback:450] Out: 640 [Urine:375; Drains:215; Blood:50] Intake/Output this shift: Total I/O In: -  Out: 120 [Drains:120]  General appearance: alert, cooperative, and no distress Resp: Unlabored breathing, no respiratory distress GI: Abdomen is soft, very mild tenderness palpation.  There is no overlying erythema.  No obvious hematoma or seroma on exam.  Wound VAC is in place over the incision with adequate suction.  JP drains are in place and functioning with minimal serosanguineous drainage in each of the bulbs.  Lab Results:     Latest Ref Rng & Units 05/19/2024    9:00 AM 11/16/2023   11:52 AM 11/04/2023    9:35 AM  CBC  WBC 4.0 - 10.5 K/uL 5.5  5.4  5.3   Hemoglobin 12.0 - 15.0 g/dL 88.1  87.7  88.3   Hematocrit 36.0 - 46.0 % 37.0  37.0  36.1   Platelets 150 - 400 K/uL 359  340  338      BMET No results for input(s): NA, K, CL, CO2, GLUCOSE, BUN, CREATININE, CALCIUM in the last 72 hours. PT/INR No results for input(s): LABPROT, INR in the last 72 hours. ABG No results for input(s): PHART, HCO3 in the last 72 hours.  Invalid input(s): PCO2, PO2  Studies/Results: No results found.  Anti-infectives: Anti-infectives (From admission, onward)    Start     Dose/Rate Route Frequency Ordered Stop   05/27/24 1600  ceFAZolin  (ANCEF ) IVPB 2g/100 mL premix        2 g 200 mL/hr over 30 Minutes Intravenous Every 8 hours 05/27/24 1002 06/03/24 1359   05/27/24 0556  ceFAZolin  (ANCEF ) 2-4 GM/100ML-% IVPB       Note to Pharmacy: Ezequiel Searing R: cabinet override      05/27/24 0556 05/27/24 1814       Assessment/Plan: s/p Procedures: REPAIR, HERNIA, INCISIONAL PANNICULECTOMY, ABDOMINAL, WITH LIPOSUCTION Plan: -I discussed with the patient that from a panniculectomy standpoint, her surgical site looks good.  Discussed with the patient though that I do not believe she is ready to go home quite yet -Would like patient to try to eat again to see if she can tolerate a diet.  Patient does have Zofran  on board.  Encouraged the patient to drink plenty of fluids and to ambulate.  Notified RN that I would like the patient to ambulate. -ADDEND: RN states patient got  up to go to bathroom, but with assistance.  Will place order for physical therapy evaluation -Pain regimen: Oxycodone  every 4 hours as needed, ibuprofen  every 6 hours, Robaxin  every 8 hours as needed, morphine  2 mg every 2 hours as needed for severe pain -Bowel regimen: Senokot, MiraLAX  PRN, Dulcolax PRN -Discussed with patient that I would like her to stay another day at least.  We did discuss the possibility of staying throughout the weekend.  Patient expressed understanding was in agreement with this. -Patient will go home with prevena wound vac  LOS: 0 days    Meagan FORBES Peck,  PA-C 05/28/2024

## 2024-05-28 NOTE — Plan of Care (Signed)
" °  Problem: Education: Goal: Knowledge of the prescribed therapeutic regimen will improve Outcome: Progressing   Problem: Bowel/Gastric: Goal: Gastrointestinal status for postoperative course will improve Outcome: Progressing   Problem: Nutritional: Goal: Will attain and maintain optimal nutritional status Outcome: Progressing   "

## 2024-05-28 NOTE — Evaluation (Signed)
 Physical Therapy Evaluation Patient Details Name: Meagan Dean MRN: [id number] DOB: 1970/09/10 Today's Date: 05/28/2024  History of Present Illness  54 y.o. female presents to [facility] hospital on 05/28/2023 for hernia repair and panniculectomy on 1/15. PMH includes gastric band, asthma, insomnia.  Clinical Impression  Pt presents to PT with deficits in power, gait, balance, endurance. Pt is able to transfer and ambulate for very short household distances without physical assistance of this PT. Pt is mobilizing at a very slow pace, taking >5 minutes to ambulate 50 feet. Pt is encourage to mobilize frequently in an effort to improve strength and gait quality/tolerance. PT recommends discharge home with HHPT, RW and BSC.      If plan is discharge home, recommend the following: A little help with walking and/or transfers;A little help with bathing/dressing/bathroom;Assistance with cooking/housework;Assist for transportation;Help with stairs or ramp for entrance   Can travel by private vehicle        Equipment Recommendations Rolling walker (2 wheels);BSC/3in1  Recommendations for Other Services       Functional Status Assessment Patient has had a recent decline in their functional status and demonstrates the ability to make significant improvements in function in a reasonable and predictable amount of time.     Precautions / Restrictions Precautions Precautions: Fall Recall of Precautions/Restrictions: Intact Precaution/Restrictions Comments: abdominal wound vac, JP drain x 2 Restrictions Weight Bearing Restrictions Per Provider Order: No      Mobility  Bed Mobility Overal bed mobility: Needs Assistance Bed Mobility: Supine to Sit     Supine to sit: Supervision, HOB elevated          Transfers Overall transfer level: Needs assistance Equipment used: Rolling walker (2 wheels) Transfers: Sit to/from Stand Sit to Stand: Contact guard assist           General transfer  comment: verbal cues for hand placement    Ambulation/Gait Ambulation/Gait assistance: Contact guard assist Gait Distance (Feet): 50 Feet Assistive device: Rolling walker (2 wheels) Gait Pattern/deviations: Step-to pattern Gait velocity: reduced Gait velocity interpretation: <1.31 ft/sec, indicative of household ambulator   General Gait Details: slowed step-to gait, very short step length bilaterally  Stairs            Wheelchair Mobility     Tilt Bed    Modified Rankin (Stroke Patients Only)       Balance Overall balance assessment: Needs assistance Sitting-balance support: No upper extremity supported, Feet supported Sitting balance-Leahy Scale: Good     Standing balance support: Single extremity supported, Reliant on assistive device for balance Standing balance-Leahy Scale: Poor                               Pertinent Vitals/Pain Pain Assessment Pain Assessment: Faces Faces Pain Scale: Hurts even more Pain Location: abdomen Pain Descriptors / Indicators: Grimacing Pain Intervention(s): Monitored during session    Home Living Family/patient expects to be discharged to:: Private residence Living Arrangements: Alone Available Help at Discharge: Friend(s);Available PRN/intermittently (little caregiver assistance available during the day) Type of Home: House Home Access: Level entry       Home Layout: One level Home Equipment: None      Prior Function Prior Level of Function : Independent/Modified Independent;Working/employed;Driving                     Extremity/Trunk Assessment   Upper Extremity Assessment Upper Extremity Assessment: Overall WFL for tasks assessed  Lower Extremity Assessment Lower Extremity Assessment: Overall WFL for tasks assessed    Cervical / Trunk Assessment Cervical / Trunk Assessment: Other exceptions Cervical / Trunk Exceptions: abdominal wound vac  Communication    Communication Communication: No apparent difficulties    Cognition Arousal: Alert Behavior During Therapy: WFL for tasks assessed/performed   PT - Cognitive impairments: No apparent impairments                         Following commands: Intact       Cueing Cueing Techniques: Verbal cues     General Comments General comments (skin integrity, edema, etc.): VSS on RA, wound vac alarming prior to pt initiating mobility, RN made aware. System error, RN calling to order new vac.    Exercises     Assessment/Plan    PT Assessment Patient needs continued PT services  PT Problem List Decreased strength;Decreased activity tolerance;Decreased balance;Decreased mobility;Decreased knowledge of use of DME;Pain       PT Treatment Interventions DME instruction;Gait training;Stair training;Functional mobility training;Therapeutic activities;Therapeutic exercise;Balance training;Neuromuscular re-education;Patient/family education    PT Goals (Current goals can be found in the Care Plan section)  Acute Rehab PT Goals Patient Stated Goal: to return to independence PT Goal Formulation: With patient Time For Goal Achievement: 06/11/24 Potential to Achieve Goals: Fair    Frequency Min 3X/week     Co-evaluation               AM-PAC PT 6 Clicks Mobility  Outcome Measure Help needed turning from your back to your side while in a flat bed without using bedrails?: A Little Help needed moving from lying on your back to sitting on the side of a flat bed without using bedrails?: A Little Help needed moving to and from a bed to a chair (including a wheelchair)?: A Little Help needed standing up from a chair using your arms (e.g., wheelchair or bedside chair)?: A Little Help needed to walk in hospital room?: A Little Help needed climbing 3-5 steps with a railing? : A Lot 6 Click Score: 17    End of Session   Activity Tolerance: Patient limited by pain;Patient limited by  fatigue Patient left: in chair;with call bell/phone within reach Nurse Communication: Mobility status PT Visit Diagnosis: Other abnormalities of gait and mobility (R26.89);Pain    Time: 1610-1636 PT Time Calculation (min) (ACUTE ONLY): 26 min   Charges:   PT Evaluation $PT Eval Low Complexity: 1 Low   PT General Charges $$ ACUTE PT VISIT: 1 Visit         Bernardino JINNY Ruth, PT, DPT Acute Rehabilitation Office [phone]   Bernardino JINNY Ruth 05/28/2024, 4:52 PM

## 2024-05-28 NOTE — Plan of Care (Signed)
" °  Problem: Bowel/Gastric: Goal: Gastrointestinal status for postoperative course will improve Outcome: Progressing   Problem: Nutritional: Goal: Will attain and maintain optimal nutritional status Outcome: Progressing   Problem: Clinical Measurements: Goal: Ability to maintain clinical measurements within normal limits Outcome: Progressing   "

## 2024-05-28 NOTE — Progress Notes (Signed)
" °   05/28/24 1043  SDOH Interventions  Social Connections Interventions Inpatient TOC   Resources listed on AVS  "

## 2024-05-29 MED ORDER — ENSURE PLUS HIGH PROTEIN PO LIQD
237.0000 mL | Freq: Two times a day (BID) | ORAL | Status: DC
  Administered 2024-05-29 – 2024-06-01 (×4): 237 mL via ORAL

## 2024-05-29 NOTE — Evaluation (Signed)
 Occupational Therapy Evaluation Patient Details Name: Meagan Dean MRN: [id number] DOB: February 16, 1971 Today's Date: 05/29/2024   History of Present Illness   54 y.o. female presents to [facility] hospital on 05/28/2023 for hernia repair and panniculectomy on 1/15. PMH includes gastric band, asthma, insomnia.     Clinical Impressions PTA Pt was independent with functional mobility as well as ADL/IADL tasks. Pt currently requires up to CGA for functional transfers and Max A for ADL engagement. Pt is primarily limited by generalized weakness, abdominal pain, decreased activity tolerance, unsteadiness on feet, and decreased knowledge of AE/DME. OT to continue to follow Pt acutely to facilitate progress towards goals. Recommend HHOT services at discharge to facilitate increased safe independent engagement in ADLs and reduce burden of care.      If plan is discharge home, recommend the following:   A little help with walking and/or transfers;A lot of help with bathing/dressing/bathroom;Assistance with cooking/housework;Assist for transportation;Help with stairs or ramp for entrance     Functional Status Assessment   Patient has had a recent decline in their functional status and demonstrates the ability to make significant improvements in function in a reasonable and predictable amount of time.     Equipment Recommendations   BSC/3in1;Other (comment) (Rolling walker)     Recommendations for Other Services         Precautions/Restrictions   Precautions Precautions: Fall Recall of Precautions/Restrictions: Intact Precaution/Restrictions Comments: abdominal wound vac, JP drain x 2 Restrictions Weight Bearing Restrictions Per Provider Order: No     Mobility Bed Mobility               General bed mobility comments: Pt greeted in recliner and returned to recliner    Transfers Overall transfer level: Needs assistance Equipment used: Rolling walker (2 wheels) Transfers: Sit  to/from Stand Sit to Stand: Contact guard assist           General transfer comment: Verbal cues for proper hand placement on RW for rise up from recliner and for controlled decline. Pt c/o dizziness on initial stand, resolving after rest time and BLE tapping of toes. Pt was able to take steps forwards and backwards with CGA and increased time      Balance Overall balance assessment: Needs assistance Sitting-balance support: No upper extremity supported, Feet supported Sitting balance-Leahy Scale: Good     Standing balance support: Bilateral upper extremity supported, During functional activity, Reliant on assistive device for balance Standing balance-Leahy Scale: Poor Standing balance comment: Dependent on RW and external support                           ADL either performed or assessed with clinical judgement   ADL Overall ADL's : Needs assistance/impaired Eating/Feeding: Set up;Sitting   Grooming: Set up;Sitting   Upper Body Bathing: Minimal assistance;Sitting   Lower Body Bathing: Maximal assistance   Upper Body Dressing : Minimal assistance;Sitting   Lower Body Dressing: Maximal assistance   Toilet Transfer: Contact guard assist;Stand-pivot;BSC/3in1;Rolling walker (2 wheels)   Toileting- Clothing Manipulation and Hygiene: Minimal assistance;Sitting/lateral lean               Vision Baseline Vision/History: 1 Wears glasses Vision Assessment?: No apparent visual deficits     Perception         Praxis         Pertinent Vitals/Pain Pain Assessment Pain Assessment: 0-10 Pain Score: 10-Worst pain ever Pain Location: abdomen Pain Descriptors / Indicators: Discomfort Pain  Intervention(s): Limited activity within patient's tolerance, Monitored during session, Repositioned     Extremity/Trunk Assessment Upper Extremity Assessment Upper Extremity Assessment: Generalized weakness   Lower Extremity Assessment Lower Extremity Assessment: Defer  to PT evaluation   Cervical / Trunk Assessment Cervical / Trunk Assessment: Other exceptions Cervical / Trunk Exceptions: abdominal wound vac   Communication Communication Communication: No apparent difficulties   Cognition Arousal: Alert Behavior During Therapy: WFL for tasks assessed/performed Cognition: No apparent impairments                               Following commands: Intact       Cueing  General Comments   Cueing Techniques: Verbal cues  Educated Pt on AE for ADL engagement.   Exercises     Shoulder Instructions      Home Living Family/patient expects to be discharged to:: Private residence Living Arrangements: Alone Available Help at Discharge: Friend(s);Available PRN/intermittently (little caregiver assistance available during the day) Type of Home: Apartment Home Access: Level entry     Home Layout: One level     Bathroom Shower/Tub: Chief Strategy Officer: Standard Bathroom Accessibility: Yes How Accessible: Accessible via walker Home Equipment: None          Prior Functioning/Environment Prior Level of Function : Independent/Modified Independent;Working/employed;Driving               ADLs Comments: Works remotely    OT Problem List: Decreased strength;Decreased activity tolerance;Impaired balance (sitting and/or standing);Decreased knowledge of use of DME or AE;Decreased knowledge of precautions;Pain   OT Treatment/Interventions: Self-care/ADL training;Therapeutic exercise;Energy conservation;DME and/or AE instruction;Therapeutic activities;Patient/family education;Balance training      OT Goals(Current goals can be found in the care plan section)   Acute Rehab OT Goals Patient Stated Goal: to get better OT Goal Formulation: With patient Time For Goal Achievement: 06/12/24 Potential to Achieve Goals: Good ADL Goals Pt Will Perform Grooming: with supervision;standing Pt Will Perform Lower Body  Bathing: with modified independence;with adaptive equipment Pt Will Perform Lower Body Dressing: with min assist;with adaptive equipment Pt Will Transfer to Toilet: with modified independence;bedside commode   OT Frequency:  Min 2X/week    Co-evaluation              AM-PAC OT 6 Clicks Daily Activity     Outcome Measure Help from another person eating meals?: A Little Help from another person taking care of personal grooming?: A Little Help from another person toileting, which includes using toliet, bedpan, or urinal?: A Little Help from another person bathing (including washing, rinsing, drying)?: A Lot Help from another person to put on and taking off regular upper body clothing?: A Little Help from another person to put on and taking off regular lower body clothing?: A Lot 6 Click Score: 16   End of Session Equipment Utilized During Treatment: Rolling walker (2 wheels)  Activity Tolerance: Patient tolerated treatment well Patient left: in chair;with call bell/phone within reach;with family/visitor present  OT Visit Diagnosis: Unsteadiness on feet (R26.81);Muscle weakness (generalized) (M62.81);Pain                Time: 8986-8965 OT Time Calculation (min): 21 min Charges:  OT General Charges $OT Visit: 1 Visit OT Evaluation $OT Eval Low Complexity: 1 Low  Maurilio CROME, OTR/L.  [facility] Acute Rehabilitation  Office: [phone]   Maurilio PARAS Lonnel Gjerde 05/29/2024, 12:10 PM

## 2024-05-29 NOTE — Progress Notes (Signed)
 Negative Pressure Machine Changed

## 2024-05-29 NOTE — Progress Notes (Signed)
 Physical Therapy Treatment Patient Details Name: Meagan Dean MRN: [id number] DOB: Jan 17, 1971 Today's Date: 05/29/2024   History of Present Illness 54 y.o. female presents to [facility] hospital on 05/28/2023 for hernia repair and panniculectomy on 1/15. PMH includes gastric band, asthma, insomnia.    PT Comments  Pt resting in bed on arrival, motivated for session however continues to be most limited by abdominal pain (despite pre-medication) and fatigue. Pt demonstrating bed mobility, transfers sit<>stand and gait with RW for support at grossly CGA-supervision level with increased time due to pain. Continued education on importance of frequent mobilization to maximize functional mobility gains with pt verbalizing understanding and agreeable to time up in chair at end of session. Pt continues to benefit from skilled PT services to progress toward functional mobility goals.     If plan is discharge home, recommend the following: A little help with walking and/or transfers;A little help with bathing/dressing/bathroom;Assistance with cooking/housework;Assist for transportation;Help with stairs or ramp for entrance   Can travel by private vehicle        Equipment Recommendations  Rolling walker (2 wheels);BSC/3in1    Recommendations for Other Services       Precautions / Restrictions Precautions Precautions: Fall Recall of Precautions/Restrictions: Intact Precaution/Restrictions Comments: abdominal wound vac, JP drain x 2 Restrictions Weight Bearing Restrictions Per Provider Order: No     Mobility  Bed Mobility Overal bed mobility: Needs Assistance Bed Mobility: Supine to Sit     Supine to sit: Supervision     General bed mobility comments: supervision for safety with use of bed features    Transfers Overall transfer level: Needs assistance Equipment used: Rolling walker (2 wheels) Transfers: Sit to/from Stand Sit to Stand: Contact guard assist           General transfer  comment: Verbal cues for proper hand placement on bed to rise, cues to turn around completely prior to sitting    Ambulation/Gait Ambulation/Gait assistance: Contact guard assist Gait Distance (Feet): 45 Feet Assistive device: Rolling walker (2 wheels) Gait Pattern/deviations: Step-to pattern, Trunk flexed Gait velocity: reduced     General Gait Details: slowed step-to gait, very short step length bilaterally   Stairs             Wheelchair Mobility     Tilt Bed    Modified Rankin (Stroke Patients Only)       Balance Overall balance assessment: Needs assistance Sitting-balance support: No upper extremity supported, Feet supported Sitting balance-Leahy Scale: Good     Standing balance support: Bilateral upper extremity supported, During functional activity, Reliant on assistive device for balance Standing balance-Leahy Scale: Poor Standing balance comment: Dependent on RW and external support                            Communication Communication Communication: No apparent difficulties  Cognition Arousal: Alert Behavior During Therapy: WFL for tasks assessed/performed                             Following commands: Intact      Cueing Cueing Techniques: Verbal cues  Exercises      General Comments General comments (skin integrity, edema, etc.): VSS on RA, best friend present and supportive      Pertinent Vitals/Pain Pain Assessment Pain Assessment: Faces Faces Pain Scale: Hurts even more Pain Location: abdomen Pain Descriptors / Indicators: Discomfort Pain Intervention(s): Premedicated before session, Monitored  during session, Limited activity within patient's tolerance, Repositioned    Home Living Family/patient expects to be discharged to:: Private residence Living Arrangements: Alone Available Help at Discharge: Friend(s);Available PRN/intermittently (little caregiver assistance available during the day) Type of Home:  Apartment Home Access: Level entry       Home Layout: One level Home Equipment: None      Prior Function            PT Goals (current goals can now be found in the care plan section) Acute Rehab PT Goals PT Goal Formulation: With patient Time For Goal Achievement: 06/11/24 Progress towards PT goals: Progressing toward goals    Frequency    Min 3X/week      PT Plan      Co-evaluation              AM-PAC PT 6 Clicks Mobility   Outcome Measure  Help needed turning from your back to your side while in a flat bed without using bedrails?: A Little Help needed moving from lying on your back to sitting on the side of a flat bed without using bedrails?: A Little Help needed moving to and from a bed to a chair (including a wheelchair)?: A Little Help needed standing up from a chair using your arms (e.g., wheelchair or bedside chair)?: A Little Help needed to walk in hospital room?: A Little Help needed climbing 3-5 steps with a railing? : A Lot 6 Click Score: 17    End of Session   Activity Tolerance: Patient limited by pain;Patient limited by fatigue Patient left: in chair;with call bell/phone within reach;with family/visitor present Nurse Communication: Mobility status PT Visit Diagnosis: Other abnormalities of gait and mobility (R26.89);Pain Pain - Right/Left: Right     Time: 9093-9069 PT Time Calculation (min) (ACUTE ONLY): 24 min  Charges:    $Gait Training: 8-22 mins $Therapeutic Activity: 8-22 mins PT General Charges $$ ACUTE PT VISIT: 1 Visit                     Mekia Dipinto R. PTA Acute Rehabilitation Services Office: [phone]   Therisa CHRISTELLA Boor 05/29/2024, 1:28 PM

## 2024-05-29 NOTE — Progress Notes (Signed)
 Patient family turn the wound Vac off,patient stated out going nurse informed her to turn it off when it's beeping

## 2024-05-29 NOTE — Plan of Care (Signed)
" °  Problem: Respiratory: Goal: Respiratory status will improve Outcome: Progressing   Problem: Coping: Goal: Level of anxiety will decrease Outcome: Progressing   Problem: Pain Managment: Goal: General experience of comfort will improve and/or be controlled Outcome: Progressing   Problem: Safety: Goal: Ability to remain free from injury will improve Outcome: Progressing   "

## 2024-05-30 DIAGNOSIS — Z823 Family history of stroke: Secondary | ICD-10-CM | POA: Diagnosis not present

## 2024-05-30 DIAGNOSIS — Z9884 Bariatric surgery status: Secondary | ICD-10-CM | POA: Diagnosis not present

## 2024-05-30 DIAGNOSIS — K9509 Other complications of gastric band procedure: Secondary | ICD-10-CM | POA: Diagnosis present

## 2024-05-30 DIAGNOSIS — Z832 Family history of diseases of the blood and blood-forming organs and certain disorders involving the immune mechanism: Secondary | ICD-10-CM | POA: Diagnosis not present

## 2024-05-30 DIAGNOSIS — K219 Gastro-esophageal reflux disease without esophagitis: Secondary | ICD-10-CM | POA: Diagnosis present

## 2024-05-30 DIAGNOSIS — J45909 Unspecified asthma, uncomplicated: Secondary | ICD-10-CM | POA: Diagnosis present

## 2024-05-30 DIAGNOSIS — M793 Panniculitis, unspecified: Secondary | ICD-10-CM | POA: Diagnosis present

## 2024-05-30 DIAGNOSIS — Z800 Family history of malignant neoplasm of digestive organs: Secondary | ICD-10-CM | POA: Diagnosis not present

## 2024-05-30 DIAGNOSIS — Z803 Family history of malignant neoplasm of breast: Secondary | ICD-10-CM | POA: Diagnosis not present

## 2024-05-30 DIAGNOSIS — Z885 Allergy status to narcotic agent status: Secondary | ICD-10-CM | POA: Diagnosis not present

## 2024-05-30 DIAGNOSIS — E876 Hypokalemia: Secondary | ICD-10-CM | POA: Diagnosis present

## 2024-05-30 DIAGNOSIS — Y848 Other medical procedures as the cause of abnormal reaction of the patient, or of later complication, without mention of misadventure at the time of the procedure: Secondary | ICD-10-CM | POA: Diagnosis present

## 2024-05-30 DIAGNOSIS — Z91040 Latex allergy status: Secondary | ICD-10-CM | POA: Diagnosis not present

## 2024-05-30 DIAGNOSIS — Z888 Allergy status to other drugs, medicaments and biological substances status: Secondary | ICD-10-CM | POA: Diagnosis not present

## 2024-05-30 DIAGNOSIS — Z79899 Other long term (current) drug therapy: Secondary | ICD-10-CM | POA: Diagnosis not present

## 2024-05-30 DIAGNOSIS — K432 Incisional hernia without obstruction or gangrene: Secondary | ICD-10-CM | POA: Diagnosis present

## 2024-05-30 DIAGNOSIS — I10 Essential (primary) hypertension: Secondary | ICD-10-CM | POA: Diagnosis present

## 2024-05-30 MED ORDER — ORAL CARE MOUTH RINSE: 15.0000 mL | OROMUCOSAL | Status: DC | PRN

## 2024-05-30 MED ORDER — SCOPOLAMINE 1 MG/3DAYS TD PT72
1.0000 | MEDICATED_PATCH | TRANSDERMAL | Status: DC
  Administered 2024-05-30: 1 mg via TRANSDERMAL
  Filled 2024-05-30: qty 1

## 2024-05-30 NOTE — Progress Notes (Signed)
 Mobility Specialist Progress Note:   05/30/24 1518  Mobility  Activity Ambulated with assistance (In hallway)  Level of Assistance Contact guard assist, steadying assist  Assistive Device Front wheel walker  Distance Ambulated (ft) 45 ft  Activity Response Tolerated well  Mobility Referral Yes  Mobility visit 1 Mobility  Mobility Specialist Start Time (ACUTE ONLY) 1456  Mobility Specialist Stop Time (ACUTE ONLY) 1516  Mobility Specialist Time Calculation (min) (ACUTE ONLY) 20 min   Received pt in bed and agreeable to mobility. Pt required MinG for safety. Pt c/o slight abdominal pain, otherwise tolerated well. Returned to room without fault. Left pt in bed. Personal belongings and call light within reach. All needs met.  Lavanda Pollack Mobility Specialist  Please contact via [employer] or  Rehab Office [phone]

## 2024-05-30 NOTE — Progress Notes (Addendum)
 Subjective: Patient underwent panniculectomy on Thursday.  Overall doing well with VAC.  Had some nausea.    Objective: Vital signs in last 24 hours: Temp:  [97.4 F (36.3 C)-98.1 F (36.7 C)] 97.4 F (36.3 C) (01/18 1053) Pulse Rate:  [59-81] 70 (01/18 1053) Resp:  [16-18] 18 (01/18 1053) BP: (95-113)/(66-81) 101/79 (01/18 1053) SpO2:  [99 %-100 %] 100 % (01/18 1053) Weight change:  Last BM Date : 05/26/24  Intake/Output from previous day: 01/17 0701 - 01/18 0700 In: 117.2 [IV Piggyback:117.2] Out: 70 [Drains:70] Intake/Output this shift: Total I/O In: 120 [P.O.:120] Out: 115 [Drains:115]   Lab Results: No results for input(s): WBC, HGB, HCT, PLT in the last 72 hours. BMET No results for input(s): NA, K, CL, CO2, GLUCOSE, BUN, CREATININE, CALCIUM in the last 72 hours.  Studies/Results: No results found.  Medications: I have reviewed the patient's current medications.  Assessment/Plan: Doing much better today.  Plan for discharge home tomorrow.   Will order patch for nausea.  LOS: 0 days   Meagan Dean 05/30/2024, 3:52 PM

## 2024-05-31 ENCOUNTER — Inpatient Hospital Stay (HOSPITAL_COMMUNITY)

## 2024-05-31 MED ORDER — POLYETHYLENE GLYCOL 3350 17 G PO PACK
17.0000 g | PACK | Freq: Two times a day (BID) | ORAL | Status: DC
  Administered 2024-05-31: 17 g via ORAL
  Filled 2024-05-31 (×2): qty 1

## 2024-05-31 MED ORDER — SENNOSIDES-DOCUSATE SODIUM 8.6-50 MG PO TABS
2.0000 | ORAL_TABLET | Freq: Two times a day (BID) | ORAL | Status: DC
  Administered 2024-05-31 – 2024-06-01 (×2): 2 via ORAL
  Filled 2024-05-31 (×2): qty 2

## 2024-05-31 MED ORDER — SODIUM CHLORIDE 0.9 % IV SOLN: INTRAVENOUS | Status: DC

## 2024-05-31 NOTE — Consult Note (Addendum)
 Initial Consultation Note   Patient: Meagan Dean FMW:[id number] DOB: [birth year] PCP: Elnora Ip, MD DOA: 05/27/2024 DOS: the patient was seen and examined on 05/31/2024 Primary service: Lowery Estefana RAMAN, DO  Referring physician: Dr. Lowery Reason for consult: Persistent n/v  Assessment/Plan: Assessment and Plan: 41F h/o asthma, HTN, OSA, recurrent SBO who is being evaluated for intractable n/v s/p panniculectomy and hernia repair on 1/15.  Constipation Intractable n/v H/o SBO Low suspicion for SBO, but will exclude given history; high suspicion for post-op constipation; pt reports last BM on the Monday prior to her arrival -MIVF: NS at 75cc/h for now -Senna/docusate BID and miralax  BID -F/u KUB to eval for SBO -Clear liquid diet for now   TRH will sign off at present, please call us  again when needed.  HPI: Meagan Dean is a 54 y.o. female with past medical history of asthma, HTN, OSA, recurrent SBO who is being evaluated for intractable n/v s/p panniculectomy and hernia repair on 1/15.  The patient had a previous lap band placement in 2022 and recently underwent surgery for skin removal and hernia repair. Since the surgery, the patient had difficulty retaining food, particularly solid foods like beef and chicken. The patient reported being able to consume soft foods such as eggs, grits, and bacon but experienced vomiting every time these foods were consumed. The patient estimated vomiting occurred at least four times today. The patient had not had a bowel movement since last Thursday and reported no sensation of constipation. The patient was concerned about potential bowel obstruction but did not feel the typical symptoms associated with it. The patient had been receiving oxycodone  and expressed discomfort but did not report any distention or severe abdominal symptoms.   Review of Systems: As mentioned in the history of present illness. All other systems reviewed  and are negative. Past Medical History:  Diagnosis Date   Anemia    ASCUS with positive high risk HPV cervical 03/15/2017   Asthma due to seasonal allergies    MILD-- JUST GOT INHALER-not used yet -allergy induced   Calculus of ureter 06/16/2013   Complication of anesthesia    reports waking up from anesthesia   DDD (degenerative disc disease), cervical    Endometriosis of pelvis    Fibroids 09/02/2017   GERD (gastroesophageal reflux disease)    diet controlled   H/O hiatal hernia    Headache    MIGRAINES - none since weight loss surgery   History of endometriosis 03/28/2017   History of hypertension    NO ISSUES SINCE WT LOSS AFTER GASTRIC BANDING   History of kidney stones    surgery to remove   History of obstructive sleep apnea    DX'D PRIOR TO GASTRIC BANDING  2010 --  NO ISSUES SINCE WT LOSS   Hypertension 03/28/2017   Low potassium syndrome    hopsitalized for 3 days   Right ureteral stone    SBO (small bowel obstruction) (HCC) 07/26/2021   Seasonal allergies    Sinus headache    Sleep apnea    prior to weight loss surgery, does not have cpap   Urgency of urination    UTI (urinary tract infection), bacterial 04/24/2022   Past Surgical History:  Procedure Laterality Date   ABDOMINOPLASTY/PANNICULECTOMY WITH LIPOSUCTION N/A 05/27/2024   Procedure: PANNICULECTOMY, ABDOMINAL, WITH LIPOSUCTION;  Surgeon: Lowery Estefana RAMAN, DO;  Location: [facility] OR;  Service: Plastics;  Laterality: N/A;   bowel obstruction  2017   CERVICAL SPINE SURGERY  2024   CYSTOSCOPY WITH RETROGRADE PYELOGRAM, URETEROSCOPY AND STENT PLACEMENT Right 06/18/2013   Procedure: CYSTOSCOPY WITH RETROGRADE PYELOGRAM, URETEROSCOPY AND STENT PLACEMENT;  Surgeon: Garnette Shack, MD;  Location: [facility];  Service: Urology;  Laterality: Right;   CYSTOSCOPY WITH RETROGRADE PYELOGRAM, URETEROSCOPY AND STENT PLACEMENT Right 07/05/2013   Procedure: CYSTOSCOPY , URETEROSCOPY ANDstone extraction;  Surgeon: Garnette Shack, MD;  Location: [facility];  Service: Urology;  Laterality: Right;   EXPLORATORY LAPARTOMY/  MYOMECTOMY  2005   HYSTERECTOMY ABDOMINAL WITH SALPINGECTOMY Bilateral 09/02/2017   Procedure: HYSTERECTOMY ABDOMINAL WITH SALPINGECTOMY;  Surgeon: Corene Coy, MD;  Location: [facility];  Service: Gynecology;  Laterality: Bilateral;   INCISIONAL HERNIA REPAIR N/A 05/27/2024   Procedure: REPAIR, HERNIA, INCISIONAL;  Surgeon: Kinsinger, Herlene Righter, MD;  Location: [facility] OR;  Service: General;  Laterality: N/A;   LAPAROSCOPIC APPENDECTOMY N/A 07/28/2014   Procedure: APPENDECTOMY LAPAROSCOPIC;  Surgeon: Krystal Russell, MD;  Location: [facility];  Service: General;  Laterality: N/A;   LAPAROSCOPIC GASTRIC BANDING  06/06/2008   LAPAROSCOPY /  OVARIAN CYSTECTOMY/  LASER ABLATION ENDOMETRIOSIS  2003   LAPAROTOMY N/A 11/24/2015   Procedure: EXPLORATORY LAPAROTOMY;  Surgeon: Dann Hummer, MD;  Location: [facility] OR;  Service: General;  Laterality: N/A;   LYSIS OF ADHESION N/A 11/24/2015   Procedure: LYSIS OF ADHESION;  Surgeon: Dann Hummer, MD;  Location: [facility] OR;  Service: General;  Laterality: N/A;   LYSIS OF ADHESION N/A 09/02/2017   Procedure: LYSIS OF ADHESION;  Surgeon: Corene Coy, MD;  Location: [facility];  Service: Gynecology;  Laterality: N/A;   wisdom teeth ext     Social History:  reports that she has never smoked. She has been exposed to tobacco smoke. She has never used smokeless tobacco. She reports current alcohol use. She reports that she does not use drugs.  Allergies[1]  Family History  Problem Relation Age of Onset   Stroke Father    Breast cancer Sister 64   Colon cancer Paternal Aunt    Colon cancer Paternal Uncle    Prostate cancer Paternal Uncle    Prostate cancer Paternal Uncle    Prostate cancer Paternal Uncle    Colon cancer Maternal Grandmother    Liver cancer Maternal Grandmother    Breast cancer Cousin 42 - 34   Breast cancer Cousin 50 -  59   Breast cancer Cousin 50 - 59   Breast cancer Cousin 50 - 59    Prior to Admission medications  Medication Sig Start Date End Date Taking? Authorizing Provider  acetaminophen  (TYLENOL ) 500 MG tablet Take 2 tablets (1,000 mg total) by mouth every 6 (six) hours. 11/29/22  Yes Masters, Katie, DO  loratadine  (CLARITIN ) 10 MG tablet Take 10 mg by mouth daily.   Yes [provider]  potassium chloride  SA (KLOR-CON  M) 20 MEQ tablet TAKE 1 TABLET BY MOUTH EVERY DAY 03/16/24  Yes Gomez-Caraballo, Maria, MD  Probiotic Product (PROBIOTIC PO) Take 1 capsule by mouth daily. acidophilus   Yes [provider]  spironolactone  (ALDACTONE ) 25 MG tablet Take 1 tablet (25 mg total) by mouth daily. 02/24/24 05/27/24 Yes Jolaine Pac, DO  albuterol  (VENTOLIN  HFA) 108 (90 Base) MCG/ACT inhaler Inhale 2 puffs into the lungs every 4 (four) hours as needed. 08/02/22   [provider]  Docusate Calcium (STOOL SOFTENER PO) Take 1 capsule by mouth daily.    [provider]  ipratropium-albuterol  (DUONEB) 0.5-2.5 (3) MG/3ML SOLN Take 3 mLs by nebulization every 6 (six) hours  as needed. 08/09/22   Victor Lynwood DASEN, PA-C  losartan  (COZAAR ) 25 MG tablet Take 1 tablet (25 mg total) by mouth daily. 02/18/24 02/17/25  Jolaine Pac, DO  meloxicam  (MOBIC ) 15 MG tablet Take 15 mg by mouth daily as needed for pain. 12/02/22   [provider]  ondansetron  (ZOFRAN ) 4 MG tablet Take 1 tablet (4 mg total) by mouth every 8 (eight) hours as needed for up to 15 doses for nausea or vomiting. 05/14/24   Andris Estefana BRAVO, PA-C  ondansetron  (ZOFRAN -ODT) 4 MG disintegrating tablet Take 1 tablet (4 mg total) by mouth every 8 (eight) hours as needed for nausea or vomiting. 11/22/22   Tegeler, Lonni PARAS, MD  oxyCODONE  (ROXICODONE ) 5 MG immediate release tablet Take 1 tablet (5 mg total) by mouth every 6 (six) hours as needed for up to 15 doses for severe pain (pain score 7-10). 05/14/24   Andris Estefana BRAVO, PA-C    Physical Exam: Vitals:   05/30/24 0603 05/30/24 1053 05/30/24 1757 05/30/24 2243  BP: 102/66 101/79 108/74 (!) 125/91  Pulse: (!) 59 70 70 87  Resp: 16 18 18 16   Temp: 97.9 F (36.6 C) (!) 97.4 F (36.3 C) 98.1 F (36.7 C) 98.1 F (36.7 C)  TempSrc: Oral Oral Oral Oral  SpO2: 100% 100% 100% 100%  Weight:      Height:       General: Alert, oriented x3, resting comfortably in no acute distress Respiratory: Lungs clear to auscultation bilaterally with normal respiratory effort; no w/r/r Cardiovascular: Regular rate and rhythm w/o m/r/g Abdomen: Soft, nontender, nondistended. Positive bowel sounds; bandage c/d/i   Data Reviewed:   Lab Results  Component Value Date   WBC 5.5 05/19/2024   HGB 11.8 (L) 05/19/2024   HCT 37.0 05/19/2024   MCV 86.7 05/19/2024   PLT 359 05/19/2024   Lab Results  Component Value Date   GLUCOSE 104 (H) 05/19/2024   CALCIUM 9.0 05/19/2024   NA 140 05/19/2024   K 3.2 (L) 05/19/2024   CO2 30 05/19/2024   CL 102 05/19/2024   BUN 13 05/19/2024   CREATININE 0.80 05/19/2024   Lab Results  Component Value Date   ALT 9 11/16/2023   AST 15 11/16/2023   ALKPHOS 72 11/16/2023   BILITOT 0.7 11/16/2023   No results found for: INR, PROTIME Radiology: No results found.      Family Communication: N/A Primary team communication: N/A   Thank you very much for involving us  in the care of your patient.  Author: Marsha Ada, MD 05/31/2024 2:46 PM  For on call review [email or web address].      [1]  Allergies Allergen Reactions   Latex Itching    Powder in latex gloves   Reglan  [Metoclopramide ] Other (See Comments)    Muscle twitching/lip twitch   Vicodin [Hydrocodone-Acetaminophen ] Other (See Comments)    Made her feel high Has had dilaudid  before and tolerated it well

## 2024-05-31 NOTE — Progress Notes (Signed)
 Physical Therapy Treatment Patient Details Name: Meagan Dean MRN: [id number] DOB: [birth year] Today's Date: 05/31/2024   History of Present Illness 54 y.o. female presents to [facility] hospital on 05/28/2023 for hernia repair and panniculectomy on 1/15. PMH includes gastric band, asthma, insomnia.    PT Comments  Pt reports fatigue, however she is pleasant and agreeable to participate in physical therapy session. She is progressing slowly towards her physical therapy goals, with slightly increased activity tolerance and ambulation distance this session. Pt ambulating 60 ft with a RW with slowed step to gait. Education reinforced regarding ambulating 3x/day, lifting and activity restrictions. Pt and pt friend verbalized understanding. Will continue to follow acutely.    If plan is discharge home, recommend the following: A little help with walking and/or transfers;A little help with bathing/dressing/bathroom;Assistance with cooking/housework;Assist for transportation;Help with stairs or ramp for entrance   Can travel by private vehicle        Equipment Recommendations  Rolling walker (2 wheels);BSC/3in1    Recommendations for Other Services       Precautions / Restrictions Precautions Precautions: Fall Recall of Precautions/Restrictions: Intact Precaution/Restrictions Comments: abdominal wound vac, JP drain x 2 Restrictions Weight Bearing Restrictions Per Provider Order: No     Mobility  Bed Mobility Overal bed mobility: Needs Assistance Bed Mobility: Supine to Sit     Supine to sit: Supervision     General bed mobility comments: HOB elevated    Transfers Overall transfer level: Needs assistance Equipment used: Rolling walker (2 wheels) Transfers: Sit to/from Stand Sit to Stand: Supervision                Ambulation/Gait Ambulation/Gait assistance: Contact guard assist Gait Distance (Feet): 60 Feet Assistive device: Rolling walker (2 wheels) Gait  Pattern/deviations: Step-to pattern, Trunk flexed Gait velocity: reduced Gait velocity interpretation: <1.31 ft/sec, indicative of household ambulator   General Gait Details: slowed step-to gait   Stairs             Wheelchair Mobility     Tilt Bed    Modified Rankin (Stroke Patients Only)       Balance Overall balance assessment: Needs assistance Sitting-balance support: No upper extremity supported, Feet supported Sitting balance-Leahy Scale: Good     Standing balance support: Bilateral upper extremity supported, During functional activity, Reliant on assistive device for balance Standing balance-Leahy Scale: Poor Standing balance comment: Dependent on RW and external support                            Communication Communication Communication: No apparent difficulties  Cognition Arousal: Alert Behavior During Therapy: WFL for tasks assessed/performed   PT - Cognitive impairments: No apparent impairments                         Following commands: Intact      Cueing Cueing Techniques: Verbal cues  Exercises      General Comments        Pertinent Vitals/Pain Pain Assessment Pain Assessment: Faces Faces Pain Scale: Hurts even more Pain Location: abdomen Pain Descriptors / Indicators: Discomfort Pain Intervention(s): Limited activity within patient's tolerance, Monitored during session    Home Living                          Prior Function            PT Goals (current goals can  now be found in the care plan section) Acute Rehab PT Goals PT Goal Formulation: With patient Time For Goal Achievement: 06/11/24 Progress towards PT goals: Progressing toward goals    Frequency    Min 2X/week      PT Plan      Co-evaluation              AM-PAC PT 6 Clicks Mobility   Outcome Measure  Help needed turning from your back to your side while in a flat bed without using bedrails?: A Little Help needed  moving from lying on your back to sitting on the side of a flat bed without using bedrails?: A Little Help needed moving to and from a bed to a chair (including a wheelchair)?: A Little Help needed standing up from a chair using your arms (e.g., wheelchair or bedside chair)?: A Little Help needed to walk in hospital room?: A Little Help needed climbing 3-5 steps with a railing? : A Lot 6 Click Score: 17    End of Session   Activity Tolerance: Patient tolerated treatment well Patient left: with call bell/phone within reach;with family/visitor present;in bed Nurse Communication: Mobility status PT Visit Diagnosis: Other abnormalities of gait and mobility (R26.89);Pain Pain - Right/Left: Right     Time: 9054-8981 PT Time Calculation (min) (ACUTE ONLY): 33 min  Charges:    $Therapeutic Activity: 23-37 mins PT General Charges $$ ACUTE PT VISIT: 1 Visit                     Aleck Daring, PT, DPT Acute Rehabilitation Services Office [phone]    Aleck ONEIDA Daring 05/31/2024, 11:06 AM

## 2024-05-31 NOTE — Plan of Care (Signed)
" °  Problem: Education: Goal: Knowledge of the prescribed therapeutic regimen will improve Outcome: Progressing   Problem: Cardiac: Goal: Ability to maintain an adequate cardiac output Outcome: Progressing   Problem: Nutritional: Goal: Will attain and maintain optimal nutritional status Outcome: Progressing   "

## 2024-05-31 NOTE — Discharge Summary (Signed)
 Physician Discharge Summary  Patient ID: Meagan Dean MRN: [id number] DOB/AGE: Dec 22, 1970 54 y.o.  Admit date: 05/27/2024 Discharge date: 05/31/2024  Admission Diagnoses:  Discharge Diagnoses:  Principal Problem:   Panniculitis   Discharged Condition: good  Hospital Course: The patient is a 54 yrs old female here for treatment of a hernia and panniculitis.  She had surgery with general surgery and plastic surgery.  She did well for the case.  In the postoperative period she had nausea and pain.  This was managed in the hospital with fluid, PT for ambulation and medicine.  She is doing very well and ready for discharge.  Plan to follow up in the office this Friday.   Consults: PT  Significant Diagnostic Studies: Surgery  Treatments: IV hydration  Discharge Exam: Blood pressure (!) 125/91, pulse 87, temperature 98.1 F (36.7 C), temperature source Oral, resp. rate 16, height 5' 2 (1.575 m), weight 74.8 kg, SpO2 100%. General appearance: alert and cooperative Incision/Wound: VAC in place with good seal.  Can be connected to the prevena for discharge.  Disposition: Discharge disposition: 01-Home or Self Care       Discharge Instructions     Call MD for:   Complete by: As directed    Call MD for:  temperature >100.4   Complete by: As directed    Diet general   Complete by: As directed    Discharge wound care:   Complete by: As directed    Keep VAC in place and empty drains daily   Driving Restrictions   Complete by: As directed    No driving while on pain meds   Increase activity slowly   Complete by: As directed    Lifting restrictions   Complete by: As directed    No lifting more than 5 pounds      Allergies as of 05/31/2024       Reactions   Latex Itching   Powder in latex gloves   Reglan  [metoclopramide ] Other (See Comments)   Muscle twitching/lip twitch   Vicodin [hydrocodone-acetaminophen ] Other (See Comments)   Made her feel high Has had  dilaudid  before and tolerated it well        Medication List     TAKE these medications    acetaminophen  500 MG tablet Commonly known as: TYLENOL  Take 2 tablets (1,000 mg total) by mouth every 6 (six) hours.   albuterol  108 (90 Base) MCG/ACT inhaler Commonly known as: VENTOLIN  HFA Inhale 2 puffs into the lungs every 4 (four) hours as needed.   ipratropium-albuterol  0.5-2.5 (3) MG/3ML Soln Commonly known as: DUONEB Take 3 mLs by nebulization every 6 (six) hours as needed.   loratadine  10 MG tablet Commonly known as: CLARITIN  Take 10 mg by mouth daily.   losartan  25 MG tablet Commonly known as: Cozaar  Take 1 tablet (25 mg total) by mouth daily.   meloxicam  15 MG tablet Commonly known as: MOBIC  Take 15 mg by mouth daily as needed for pain.   ondansetron  4 MG disintegrating tablet Commonly known as: ZOFRAN -ODT Take 1 tablet (4 mg total) by mouth every 8 (eight) hours as needed for nausea or vomiting.   ondansetron  4 MG tablet Commonly known as: Zofran  Take 1 tablet (4 mg total) by mouth every 8 (eight) hours as needed for up to 15 doses for nausea or vomiting.   oxyCODONE  5 MG immediate release tablet Commonly known as: Roxicodone  Take 1 tablet (5 mg total) by mouth every 6 (six) hours as needed  for up to 15 doses for severe pain (pain score 7-10).   potassium chloride  SA 20 MEQ tablet Commonly known as: KLOR-CON  M TAKE 1 TABLET BY MOUTH EVERY DAY   PROBIOTIC PO Take 1 capsule by mouth daily. acidophilus   spironolactone  25 MG tablet Commonly known as: ALDACTONE  Take 1 tablet (25 mg total) by mouth daily.   STOOL SOFTENER PO Take 1 capsule by mouth daily.               Discharge Care Instructions  (From admission, onward)           Start     Ordered   05/31/24 0000  Discharge wound care:       Comments: Keep VAC in place and empty drains daily   05/31/24 0715            Follow-up Information     Kinsinger, Herlene Righter, MD Follow up on  06/17/2024.   Specialty: General Surgery Contact information: [address] [place] [address] [phone]         Lowery Estefana RAMAN, DO Follow up.   Specialty: Plastic Surgery Contact information: [address] [place] [address] [phone]                 Signed: Estefana RAMAN Payten Hobin 05/31/2024, 7:16 AM

## 2024-05-31 NOTE — TOC Progression Note (Signed)
 Transition of Care [facility]) - Progression Note    Patient Details  Name: Meagan Dean MRN: [id number] Date of Birth: February 23, 1971  [facility] CM/SW Contact  Tom-Johnson, Asheton Viramontes Daphne, RN Phone Number: 05/31/2024, 2:43 PM  Clinical Narrative:     Patient was scheduled for discharge home today. Patient c/o of nausea and Vomiting after eating food. MD cancelled discharge for further management.  CM spoke with patient and friend, sandra at bedside about home health recommendations and DME orders. CM informed patient that referral was sent to various agencies though the [facility] and all declined. Patient given a copy. Patient voiced understanding. Patient has f/u appointment on Friday with Plastic Sx and drain education was done by RN at bedside. RW and BSC was delivered to patient at bedside by [employer].  CM will continue to follow as patient progresses with care towards discharge.     Expected Discharge Plan:  (see note) Barriers to Discharge: Continued Medical Work up               Expected Discharge Plan and Services In-house Referral: NA Discharge Planning Services: CM Consult   Living arrangements for the past 2 months: Apartment Expected Discharge Date: 05/31/24               DME Arranged:  (See note)         HH Arranged:  (see note)           Social Drivers of Health (SDOH) Interventions SDOH Screenings   Food Insecurity: No Food Insecurity (05/27/2024)  Housing: Low Risk (05/27/2024)  Transportation Needs: No Transportation Needs (05/27/2024)  Utilities: Not At Risk (05/27/2024)  Alcohol Screen: Low Risk (02/18/2024)  Depression (PHQ2-9): Low Risk (02/24/2024)  Financial Resource Strain: Medium Risk (02/18/2024)  Physical Activity: Insufficiently Active (02/18/2024)  Social Connections: Moderately Isolated (02/18/2024)  Stress: Stress Concern Present (02/18/2024)  Tobacco Use: Medium Risk (05/28/2024)  Health Literacy: Adequate Health Literacy  (02/18/2024)    Readmission Risk Interventions     No data to display

## 2024-05-31 NOTE — Progress Notes (Signed)
" °  °  Durable Medical Equipment  (From admission, onward)           Start     Ordered   05/31/24 1001  For home use only DME Walker rolling  Once       Question Answer Comment  Walker: With 5 Inch Wheels   Patient needs a walker to treat with the following condition Gait instability      05/31/24 1001   05/31/24 0955  For home use only DME Bedside commode  Once       Comments: Patient underwent Panniculectomy, will be confined to one room, has Generalized Weakness and Decreased Activity Tolerance which necessitate recommendation for bedside commode.  Question:  Patient needs a bedside commode to treat with the following condition  Answer:  Weakness   05/31/24 1001            "

## 2024-05-31 NOTE — Progress Notes (Addendum)
" ° °  Brief Progress Note   _____________________________________________________________________________________________________________  Patient Name: Meagan Dean Patient DOB: 02/17/1971 Date: @TODAY @      Data: Pt unable to keep solid food down today     Action: Spoke with Dr. Lowery on phone d/c is cancelled and  Doctor  Dillingham will reach out to the hospitalist  to see pt and for orders for N/V order.  _____________________________________________________________________________________________________________  [facility] RN Expeditor Ronal DELENA Bald Please contact us  directly via secure chat (search for [address] [facility]) or by calling us  at [phone] [facility]).  "

## 2024-06-01 ENCOUNTER — Encounter (HOSPITAL_COMMUNITY): Payer: Self-pay | Admitting: Anesthesiology

## 2024-06-01 SURGERY — REMOVAL, GASTRIC BAND, LAPAROSCOPIC
Anesthesia: Choice

## 2024-06-01 NOTE — Discharge Summary (Signed)
 Physician Discharge Summary  Patient ID: Meagan Dean MRN: [id number] DOB/AGE: 54-Jan-1972 54 y.o.  Admit date: 05/27/2024 Discharge date: 06/01/2024  Admission Diagnoses:  Discharge Diagnoses:  Principal Problem:   Panniculitis   Discharged Condition: good  Hospital Course: The patient was taken to the operating room on January 15.  She underwent a panniculectomy and hernia repair.  Postoperatively she had some weakness and walking so physical therapy was consulted.  Patient improved.  She then had some nausea and vomiting that was persistent.  Internal medicine was consulted and a KUB was done which showed some slippage of the Lap-Band.  General surgery saw the patient and offered to either remove the band or release some of the fluid.  The patient did not want this to occur.  Patient stated that she was feeling better and that the nausea and vomiting had been an issue even prior to surgery.  The plan is for her to follow-up with general surgery as an outpatient.  Patient states she is ready to be discharged.  She should go home with the Prevena.  And to follow-up Friday and will remove the Prevena.  Continue with the abdominal binder.  Consults: Internal medicine hospitalist service for nausea  Significant Diagnostic Studies: Radiographic exam of GI tract due to persistent nausea and vomiting.  Impression was increased angulation of the gastric Lap-Band suspicious for posterior slippage.  Treatments: Surgery  Discharge Exam: Blood pressure 114/78, pulse 71, temperature 98 F (36.7 C), temperature source Oral, resp. rate 15, height 5' 2 (1.575 m), weight 74.8 kg, SpO2 100%. Incision/Wound:  Disposition: Discharge disposition: 01-Home or Self Care       Discharge Instructions     Call MD for:   Complete by: As directed    Call MD for:  temperature >100.4   Complete by: As directed    Call MD for:  temperature >100.4   Complete by: As directed    Diet general   Complete  by: As directed    Diet general   Complete by: As directed    Discharge wound care:   Complete by: As directed    Keep VAC in place and empty drains daily   Discharge wound care:   Complete by: As directed    Keep vac connected to prevena   Driving Restrictions   Complete by: As directed    No driving while on pain meds   Driving Restrictions   Complete by: As directed    No driving while on pain medication   Increase activity slowly   Complete by: As directed    Increase activity slowly   Complete by: As directed    Lifting restrictions   Complete by: As directed    No lifting more than 5 pounds   Lifting restrictions   Complete by: As directed    No heavy lifting         Follow-up Information     Kinsinger, Herlene Righter, MD Follow up on 06/17/2024.   Specialty: General Surgery Contact information: [address] [place] [address] [phone]         Lowery Estefana RAMAN, DO Follow up.   Specialty: Plastic Surgery Contact information: [address] [place] [address] [phone]         [facility] (DME) Follow up.   Specialty: DME Services Why: Call if DME not delivered. Contact information: [address] [employer] [place]  [address] [phone]  SignedBETHA Estefana RAMAN Sabre Romberger 06/01/2024, 10:51 AM

## 2024-06-01 NOTE — Progress Notes (Addendum)
 Xr concerning for slip and she has been nauseated and vomiting and unable to keep food down. On imaging review XR band position is similar to previous CT scan. I do not think she needs an upper Gi. I spoke to her over the phone and recommended laparoscopic band removal to stop the issue. She would not like her band removed without a direct plan for surgical weight loss. This is difficult because her current weight she is not a candidate for that. She would like to think about it and try to keep her band in place.   I will come to see her today to remove the fluid in her port and see if that relieves part of the issue.   Most generally band slips require surgery to fix and removal is the most effective method.  9:58 I came in to offer band fluid removal, though on imaging the port is inverted toward the right shoulder. She has additional concerns about even fluid removal leading to weight gain and depression. She has tolerated liquids and egg this morning. I continue to have concerns about her success at home but if she would not like to undergo any procedure on the band at this time, I will arrange short interval follow up to ensure that she continues to tolerate some nutrition -recommend full liquid diet/pureed diet and avoiding more dense foods for the next week to avoid further vomiting/dehydration/malnutrition -The standard of care for vomiting and concern of slip is band removal, fluid removal can be used to offset emergency. Her decision to avoid procedure does put her at higher risk of nutritional intake issues. I discussed this with her and she showed good understanding.

## 2024-06-01 NOTE — Progress Notes (Signed)
 5 Days Post-Op  Subjective: Patient is a 54 year old female who underwent panniculectomy with Dr. Lowery on 05/27/2024.  Of note, she also underwent repair of incisional hernia at the same time with Dr. Stevie.   Today, patient is lying comfortably in bed upon entering the room.  She reports that she still has not had a bowel movement yet.  She reports that she is passing a little bit of gas.  She states that she is still not tolerating solid food, but has been tolerating liquids.  She reports that she has been up and ambulating with physical therapy.  Objective: Vital signs in last 24 hours: Temp:  [98.1 F (36.7 C)-98.3 F (36.8 C)] 98.2 F (36.8 C) (01/20 0530) Pulse Rate:  [73-85] 77 (01/20 0530) Resp:  [16-18] 18 (01/19 2026) BP: (97-131)/(75-78) 110/77 (01/20 0530) SpO2:  [99 %-100 %] 99 % (01/20 0530) Last BM Date : 05/26/24  Intake/Output from previous day: 01/19 0701 - 01/20 0700 In: 1060 [P.O.:1060] Out: 375 [Emesis/NG output:200; Drains:175] Intake/Output this shift: No intake/output data recorded.  General appearance: alert, cooperative, and no distress Resp: Unlabored breathing, no respiratory distress GI: Abdomen is soft and nontender, no distention or peritoneal signs are noted.  There is no overlying erythema, no obvious fluid collection on exam.  Wound VAC is in place and appears to be properly functioning.  No leaks are noted.  Adequate suction is noted at 125 mmHg.  JP drains are in place and functioning with minimal serosanguineous drainage in each of the bulbs.  Lab Results:     Latest Ref Rng & Units 05/19/2024    9:00 AM 11/16/2023   11:52 AM 11/04/2023    9:35 AM  CBC  WBC 4.0 - 10.5 K/uL 5.5  5.4  5.3   Hemoglobin 12.0 - 15.0 g/dL 88.1  87.7  88.3   Hematocrit 36.0 - 46.0 % 37.0  37.0  36.1   Platelets 150 - 400 K/uL 359  340  338     BMET No results for input(s): NA, K, CL, CO2, GLUCOSE, BUN, CREATININE, CALCIUM in the last 72  hours. PT/INR No results for input(s): LABPROT, INR in the last 72 hours. ABG No results for input(s): PHART, HCO3 in the last 72 hours.  Invalid input(s): PCO2, PO2  Studies/Results: DG Abd 1 View Result Date: 05/31/2024 CLINICAL DATA:  Abdominal pain. EXAM: ABDOMEN - 1 VIEW COMPARISON:  Abdominal radiograph dated 08/04/2021. FINDINGS: A gastric lap band is noted. The lap band demonstrates a different orientation compared to prior radiograph with a O sign concerning for posterior band slippage. Upper GI study may provide better evaluation. There is moderate stool throughout the colon. No bowel dilatation or evidence of obstruction. No free air a calculi. No acute osseous pathology. IMPRESSION: Increased angulation of the gastric lap band suspicious for posterior slippage. Electronically Signed   By: Vanetta Chou M.D.   On: 05/31/2024 20:18    Anti-infectives: Anti-infectives (From admission, onward)    Start     Dose/Rate Route Frequency Ordered Stop   05/27/24 1600  ceFAZolin  (ANCEF ) IVPB 2g/100 mL premix        2 g 200 mL/hr over 30 Minutes Intravenous Every 8 hours 05/27/24 1002 06/03/24 1359   05/27/24 0556  ceFAZolin  (ANCEF ) 2-4 GM/100ML-% IVPB       Note to Pharmacy: Ezequiel Searing R: cabinet override      05/27/24 0556 05/27/24 1814       Assessment/Plan: s/p Procedures: REPAIR, HERNIA, INCISIONAL  PANNICULECTOMY, ABDOMINAL, WITH LIPOSUCTION Plan: - Patient is 5 days out from her procedures.  She is still having issues tolerating a regular diet and having bowel movements. -Internal medicine was consulted and they ordered KUB.  KUB shows increased angulation of the gastric Lap-Band suspicious for posterior slippage.  There is no evidence of obstruction. -Will consult general surgery given KUB results -Dr. Lowery states that she will reach out to them -Will have patient continue on thin liquid diet for now -Bowel regimen: Dulcolax prn, miralax ,  senokot  - Continue wound VAC at 125 mmHg. -Encourage patient to continue to ambulate. -Discussed plan with Dr. Lowery   LOS: 2 days    Estefana FORBES Peck, PA-C 06/01/2024

## 2024-06-01 NOTE — Anesthesia Preprocedure Evaluation (Addendum)
"                                    Anesthesia Evaluation    Reviewed: Allergy & Precautions, Patient's Chart, lab work & pertinent test results  History of Anesthesia Complications Negative for: history of anesthetic complications  Airway        Dental   Pulmonary asthma , sleep apnea           Cardiovascular hypertension, Pt. on medications      Neuro/Psych  Headaches  negative psych ROS   GI/Hepatic Neg liver ROS, hiatal hernia,GERD  Controlled,, S/p gastric banding    Endo/Other   K 3.2 Obesity   Renal/GU negative Renal ROS     Musculoskeletal negative musculoskeletal ROS (+)    Abdominal   Peds  Hematology  (+) Blood dyscrasia, anemia   Anesthesia Other Findings   Reproductive/Obstetrics  s/p hysterectomy                               Anesthesia Physical Anesthesia Plan  ASA: 3  Anesthesia Plan: General   Post-op Pain Management: Ofirmev  IV (intra-op)* and Toradol  IV (intra-op)*   Induction: Intravenous  PONV Risk Score and Plan: 3 and Treatment may vary due to age or medical condition, Ondansetron , Dexamethasone  and Midazolam   Airway Management Planned: Oral ETT  Additional Equipment: None  Intra-op Plan:   Post-operative Plan: Extubation in OR  Informed Consent:   Plan Discussed with: CRNA and Anesthesiologist  Anesthesia Plan Comments:          Anesthesia Quick Evaluation  "

## 2024-06-04 ENCOUNTER — Ambulatory Visit: Admitting: Student

## 2024-06-04 ENCOUNTER — Encounter: Payer: Self-pay | Admitting: Student

## 2024-06-04 VITALS — BP 115/77 | HR 87 | Ht 62.0 in | Wt 159.2 lb

## 2024-06-04 DIAGNOSIS — Z9889 Other specified postprocedural states: Secondary | ICD-10-CM

## 2024-06-04 NOTE — Progress Notes (Signed)
 Patient is a 54 year old female who underwent panniculectomy with Dr. Lowery on 05/27/2024.  Of note, she also underwent repair of incisional hernia at the same time with Dr. Stevie.  Patient was kept in the hospital for observation, but ended up staying several days.   Postoperatively she had some weakness and walking so physical therapy was consulted. Patient improved. She then had some nausea and vomiting that was persistent. Internal medicine was consulted and a KUB was done which showed some slippage of the Lap-Band. General surgery saw the patient and offered to either remove the band or release some of the fluid. The patient did not want this to occur.  Patient was discharged from the hospital on 06/01/2024.  Patient has now a little over 1 week out from her surgery.  She presents to the clinic for postoperative follow-up.  Today, patient reports with her friend at bedside.  She states that she has been doing well since she was discharged.  She reports that she has been for the most part tolerating food and has been tolerating drinks.  She reports that she has had a bowel movement.  She states that she has been up and ambulating.  Reports that her drain output on each side has been about 50 to 60 cc/day.  She denies any fevers or chills.  Reports that her abdomen is a little bit sore.  Reports that she is overall doing well and feels that she has much improved since being in the hospital.  Chaperone present on exam.  On exam, patient sitting upright in no acute distress.  Abdomen is soft and nondistended.  There is no overlying erythema, no obvious fluid collections on exam.  There is some mild tenderness to palpation diffusely.  No peritoneal signs noted.  Prevena wound VAC is in place over the incision.  This was removed.  Incision appears to be clean dry and intact.  Umbilicus overall appears to be healthy.  There was a little bit of drainage around the umbilicus.  This was cleaned with Vashe  gauze.  JP drains are in place and functioning bilaterally with serosanguineous drainage in each of the bulbs.  There were no signs of infection on exam.  Incision was cleaned with Vashe and Mepilex border dressings were placed over the incision, umbilicus and the bilateral drain sites.  Discussed with the patient that overall, it appears she is improving from her surgery.  Recommended that she apply Vaseline once a day to the umbilicus.  She may keep this in the incisions covered with the Mepilex.  She expressed understanding.  Discussed with the patient the importance of wearing compression at all times and avoiding strenuous activities.  Encouraged her to continue to get up and ambulate, and make sure that she is drinking plenty of fluids.  Patient expressed understanding.  Discussed with the patient to continue to monitor her drain output.  I discussed with the patient that she will need to follow-up with general surgery in regards to the hernia repair as well as the lap band slippage that was noted on the KUB.  She expressed understanding.  Instructed the patient to call if she has any questions or concerns.  Patient to follow back up next week.

## 2024-06-10 NOTE — Progress Notes (Signed)
 Patient is a 54 year old female who underwent panniculectomy with Dr. Lowery on 05/27/2024.  Of note, she also underwent repair of incisional hernia at the same time with Dr. Stevie.  Patient was kept in the hospital for observation, but ended up staying several days.  Patient was discharged from the hospital on 06/01/2024.  She presents to the clinic today for postoperative follow-up.     Patient was last seen in the clinic on 06/04/2024.  At this visit, patient was doing well.  Her drain output was still high.  On exam, abdomen was soft and nondistended.  There is no overlying erythema, no obvious fluid collection on exam.  Prevena wound VAC was in place over the incision.  This was removed.  Incision appeared to be clean dry and intact.  JP drains were in place and functioning with serosanguineous drainage in the bulbs.  Is recommended that she apply Vaseline once daily to her umbilicus.  Recommended that she continue with compression at all times and that she follow-up with general surgery.  Today, patient presents with her friend at bedside.  She reports that overall she is doing well.  She states that she is still little bit sore, but is not having to take medications for the pain.  She states that she has been up and ambulating.  She states that she has been eating and drinking.  She does report she has had a little bit of nausea, but has been tolerating food.  She states that she has been having bowel movements.  She reports that her drain output is still about 20 to 30 cc on the left and about 50 cc on the right per day.  She does not report any infectious symptoms.  Chaperone present on exam.  On exam, patient sitting upright in no acute distress.  Abdomen is overall soft.  There is some mild tenderness to palpation to the left side of the abdomen.  There is no overlying erythema, no obvious fluid collection on exam.  Dressings over the incisions are clean dry and intact.  These were removed.   Incision does overall appear to be healing well.  To the left incision, there is an approximately 3 cm x 0.15 cm wound.  To the umbilicus, it appears that the top layer of skin has sloughed off.  There does appear to be healthy tissue throughout the remainder of the umbilicus.  There are no signs of infection on exam.  JP drains are in place and functioning with serosanguineous drainage in each of the bulbs.  I discussed with the patient that her drain output is still too high for drain removal.  Discussed with her she can apply Vaseline and dressings around her drain sites if she would like for comfort.  She expressed understanding.  I discussed with the patient that the top layer of her umbilicus appears to have sloughed off.  Recommend she apply Vaseline to the umbilicus daily.  Recommended that she apply calcium alginate to her wound to the incision daily.  Patient may apply Vaseline throughout the remainder of the incision daily.  She expressed understanding.  Discussed with the patient the importance of continuing with her compression and continuing to ambulate.  Encouraged her to continue to drink plenty of fluids.  Patient expressed understanding.  Encouraged patient to follow-up with general surgery.  She states she has an appointment with them on Wednesday.  Patient has an appointment next week.  I instructed her to call in the meantime  with any questions or concerns.  Pictures were obtained of the patient and placed in the chart with the patient's or guardian's permission.

## 2024-06-11 ENCOUNTER — Ambulatory Visit: Admitting: Student

## 2024-06-11 VITALS — BP 98/63 | HR 87

## 2024-06-11 DIAGNOSIS — Z9889 Other specified postprocedural states: Secondary | ICD-10-CM

## 2024-06-18 ENCOUNTER — Encounter: Admitting: Plastic Surgery

## 2024-06-18 DIAGNOSIS — M793 Panniculitis, unspecified: Secondary | ICD-10-CM

## 2024-06-18 NOTE — Progress Notes (Signed)
 The patient is a 54 year old female here for follow-up after undergoing panniculectomy.  She had the surgery in January.  She looks really good.  The area of the incision and umbilicus is healing nicely.  There does not appear to be any seroma, hematoma or infection.  The drain output is minimal but it is little higher on the right.  Will go ahead and remove the left drain.  She should continue with compression and we will plan to see her back in 2 weeks.  Will plan on getting a picture at that time.

## 2024-06-29 ENCOUNTER — Encounter: Admitting: Plastic Surgery

## 2024-07-02 ENCOUNTER — Encounter: Admitting: Student

## 2024-07-07 ENCOUNTER — Encounter: Admitting: Skilled Nursing Facility1
